# Patient Record
Sex: Male | Born: 1980 | Race: Black or African American | Hispanic: No | Marital: Single | Smoking: Former smoker
Health system: Southern US, Community
[De-identification: ages and names within clinical notes are randomized; demographics above are authoritative.]

## PROBLEM LIST (undated history)

## (undated) DIAGNOSIS — R112 Nausea with vomiting, unspecified: Secondary | ICD-10-CM

## (undated) DIAGNOSIS — E109 Type 1 diabetes mellitus without complications: Secondary | ICD-10-CM

## (undated) DIAGNOSIS — G629 Polyneuropathy, unspecified: Secondary | ICD-10-CM

## (undated) DIAGNOSIS — E119 Type 2 diabetes mellitus without complications: Secondary | ICD-10-CM

## (undated) DIAGNOSIS — E11311 Type 2 diabetes mellitus with unspecified diabetic retinopathy with macular edema: Secondary | ICD-10-CM

---

## 2002-03-11 ENCOUNTER — Encounter: Payer: Self-pay | Admitting: Emergency Medicine

## 2002-03-11 ENCOUNTER — Emergency Department (HOSPITAL_COMMUNITY): Admission: EM | Admit: 2002-03-11 | Discharge: 2002-03-11 | Payer: Self-pay | Admitting: Emergency Medicine

## 2002-10-11 ENCOUNTER — Emergency Department (HOSPITAL_COMMUNITY): Admission: EM | Admit: 2002-10-11 | Discharge: 2002-10-11 | Payer: Self-pay | Admitting: Internal Medicine

## 2014-04-30 ENCOUNTER — Encounter (HOSPITAL_COMMUNITY): Payer: Self-pay | Admitting: Emergency Medicine

## 2014-04-30 ENCOUNTER — Emergency Department (HOSPITAL_COMMUNITY)
Admission: EM | Admit: 2014-04-30 | Discharge: 2014-04-30 | Disposition: A | Discharge status: Home / Self Care | Payer: PRIVATE HEALTH INSURANCE | Attending: Emergency Medicine | Admitting: Emergency Medicine

## 2014-04-30 ENCOUNTER — Emergency Department (HOSPITAL_COMMUNITY): Payer: PRIVATE HEALTH INSURANCE

## 2014-04-30 DIAGNOSIS — IMO0002 Reserved for concepts with insufficient information to code with codable children: Secondary | ICD-10-CM | POA: Insufficient documentation

## 2014-04-30 DIAGNOSIS — E119 Type 2 diabetes mellitus without complications: Secondary | ICD-10-CM | POA: Insufficient documentation

## 2014-04-30 DIAGNOSIS — F17200 Nicotine dependence, unspecified, uncomplicated: Secondary | ICD-10-CM | POA: Insufficient documentation

## 2014-04-30 DIAGNOSIS — T148XXA Other injury of unspecified body region, initial encounter: Secondary | ICD-10-CM

## 2014-04-30 DIAGNOSIS — S93409A Sprain of unspecified ligament of unspecified ankle, initial encounter: Secondary | ICD-10-CM | POA: Insufficient documentation

## 2014-04-30 DIAGNOSIS — S96919A Strain of unspecified muscle and tendon at ankle and foot level, unspecified foot, initial encounter: Secondary | ICD-10-CM

## 2014-04-30 DIAGNOSIS — Y929 Unspecified place or not applicable: Secondary | ICD-10-CM | POA: Insufficient documentation

## 2014-04-30 DIAGNOSIS — Y9389 Activity, other specified: Secondary | ICD-10-CM | POA: Insufficient documentation

## 2014-04-30 DIAGNOSIS — Z880 Allergy status to penicillin: Secondary | ICD-10-CM | POA: Insufficient documentation

## 2014-04-30 DIAGNOSIS — W171XXA Fall into storm drain or manhole, initial encounter: Secondary | ICD-10-CM | POA: Insufficient documentation

## 2014-04-30 HISTORY — DX: Type 2 diabetes mellitus without complications: E11.9

## 2014-04-30 MED ORDER — IBUPROFEN 800 MG PO TABS: 800.0000 mg | ORAL_TABLET | Freq: Three times a day (TID) | ORAL | Status: DC

## 2014-04-30 NOTE — ED Notes (Signed)
Pt presents with injury to right ankle. Pt states a metal manhole cover fell onto his ankle/foot. Pt states is unable to put "too much weight" onto foot.

## 2014-04-30 NOTE — Discharge Instructions (Signed)
Ankle Sprain °An ankle sprain is an injury to the strong, fibrous tissues (ligaments) that hold the bones of your ankle joint together.  °CAUSES °An ankle sprain is usually caused by a fall or by twisting your ankle. Ankle sprains most commonly occur when you step on the outer edge of your foot, and your ankle turns inward. People who participate in sports are more prone to these types of injuries.  °SYMPTOMS  °· Pain in your ankle. The pain may be present at rest or only when you are trying to stand or walk. °· Swelling. °· Bruising. Bruising may develop immediately or within 1 to 2 days after your injury. °· Difficulty standing or walking, particularly when turning corners or changing directions. °DIAGNOSIS  °Your caregiver will ask you details about your injury and perform a physical exam of your ankle to determine if you have an ankle sprain. During the physical exam, your caregiver will press on and apply pressure to specific areas of your foot and ankle. Your caregiver will try to move your ankle in certain ways. An X-ray exam may be done to be sure a bone was not broken or a ligament did not separate from one of the bones in your ankle (avulsion fracture).  °TREATMENT  °Certain types of braces can help stabilize your ankle. Your caregiver can make a recommendation for this. Your caregiver may recommend the use of medicine for pain. If your sprain is severe, your caregiver may refer you to a surgeon who helps to restore function to parts of your skeletal system (orthopedist) or a physical therapist. °HOME CARE INSTRUCTIONS  °· Apply ice to your injury for 1 2 days or as directed by your caregiver. Applying ice helps to reduce inflammation and pain. °· Put ice in a plastic bag. °· Place a towel between your skin and the bag. °· Leave the ice on for 15-20 minutes at a time, every 2 hours while you are awake. °· Only take over-the-counter or prescription medicines for pain, discomfort, or fever as directed by  your caregiver. °· Elevate your injured ankle above the level of your heart as much as possible for 2 3 days. °· If your caregiver recommends crutches, use them as instructed. Gradually put weight on the affected ankle. Continue to use crutches or a cane until you can walk without feeling pain in your ankle. °· If you have a plaster splint, wear the splint as directed by your caregiver. Do not rest it on anything harder than a pillow for the first 24 hours. Do not put weight on it. Do not get it wet. You may take it off to take a shower or bath. °· You may have been given an elastic bandage to wear around your ankle to provide support. If the elastic bandage is too tight (you have numbness or tingling in your foot or your foot becomes cold and blue), adjust the bandage to make it comfortable. °· If you have an air splint, you may blow more air into it or let air out to make it more comfortable. You may take your splint off at night and before taking a shower or bath. Wiggle your toes in the splint several times per day to decrease swelling. °SEEK MEDICAL CARE IF:  °· You have rapidly increasing bruising or swelling. °· Your toes feel extremely cold or you lose feeling in your foot. °· Your pain is not relieved with medicine. °SEEK IMMEDIATE MEDICAL CARE IF: °· Your toes are numb   or blue.  You have severe pain that is increasing. MAKE SURE YOU:   Understand these instructions.  Will watch your condition.  Will get help right away if you are not doing well or get worse. Document Released: 12/12/2005 Document Revised: 09/05/2012 Document Reviewed: 12/24/2011 [facility] Patient Information 2014 [place], [place].  Abrasion An abrasion is a cut or scrape of the skin. Abrasions do not extend through all layers of the skin and most heal within 10 days. It is important to care for your abrasion properly to prevent infection. CAUSES  Most abrasions are caused by falling on, or gliding across, the ground or other  surface. When your skin rubs on something, the outer and inner layer of skin rubs off, causing an abrasion. DIAGNOSIS  Your caregiver will be able to diagnose an abrasion during a physical exam.  TREATMENT  Your treatment depends on how large and deep the abrasion is. Generally, your abrasion will be cleaned with water and a mild soap to remove any dirt or debris. An antibiotic ointment may be put over the abrasion to prevent an infection. A bandage (dressing) may be wrapped around the abrasion to keep it from getting dirty.  You may need a tetanus shot if:  You cannot remember when you had your last tetanus shot.  You have never had a tetanus shot.  The injury broke your skin. If you get a tetanus shot, your arm may swell, get red, and feel warm to the touch. This is common and not a problem. If you need a tetanus shot and you choose not to have one, there is a rare chance of getting tetanus. Sickness from tetanus can be serious.  HOME CARE INSTRUCTIONS   If a dressing was applied, change it at least once a day or as directed by your caregiver. If the bandage sticks, soak it off with warm water.   Wash the area with water and a mild soap to remove all the ointment 2 times a day. Rinse off the soap and pat the area dry with a clean towel.   Reapply any ointment as directed by your caregiver. This will help prevent infection and keep the bandage from sticking. Use gauze over the wound and under the dressing to help keep the bandage from sticking.   Change your dressing right away if it becomes wet or dirty.   Only take over-the-counter or prescription medicines for pain, discomfort, or fever as directed by your caregiver.   Follow up with your caregiver within 24 48 hours for a wound check, or as directed. If you were not given a wound-check appointment, look closely at your abrasion for redness, swelling, or pus. These are signs of infection. SEEK IMMEDIATE MEDICAL CARE IF:   You  have increasing pain in the wound.   You have redness, swelling, or tenderness around the wound.   You have pus coming from the wound.   You have a fever or persistent symptoms for more than 2 3 days.  You have a fever and your symptoms suddenly get worse.  You have a bad smell coming from the wound or dressing.  MAKE SURE YOU:   Understand these instructions.  Will watch your condition.  Will get help right away if you are not doing well or get worse. Document Released: 09/21/2005 Document Revised: 11/28/2012 Document Reviewed: 11/15/2011 [facility] Patient Information 2014 [place], [place].

## 2014-04-30 NOTE — ED Provider Notes (Signed)
CSN: [id number]     Arrival date & time 04/30/14  1000 History   First MD Initiated Contact with Patient 04/30/14 1003     Chief Complaint  Patient presents with  . Ankle Injury     (Consider location/radiation/quality/duration/timing/severity/associated sxs/prior Treatment) HPI Comments: Pt states that he fell into the man hole. Pt state that it hurts to put wt on the area. Denies numbness or weakness. Feels like he can't put full weight on the area. C/o abrasion to the left pinky finger. Pt states that this is a work related injury. Denies previous ankle of foot injury  The history is provided by the patient. No language interpreter was used.    Past Medical History  Diagnosis Date  . Diabetes mellitus without complication    History reviewed. No pertinent past surgical history. History reviewed. No pertinent family history. History  Substance Use Topics  . Smoking status: Current Every Day Smoker  . Smokeless tobacco: Not on file  . Alcohol Use: Yes    Review of Systems  Constitutional: Negative.   Respiratory: Negative.   Cardiovascular: Negative.       Allergies  Penicillins  Home Medications   Prior to Admission medications   Not on File   BP 135/90  Pulse 96  Temp(Src) 98 F (36.7 C) (Oral)  Resp 18  Ht 6\' 1"  (1.854 m)  Wt 189 lb (85.73 kg)  BMI 24.94 kg/m2  SpO2 100% Physical Exam  Nursing note and vitals reviewed. Constitutional: He is oriented to person, place, and time. He appears well-developed and well-nourished.  Cardiovascular: Normal rate and regular rhythm.   Pulmonary/Chest: Effort normal and breath sounds normal.  Musculoskeletal:  No swelling or deformity to the right ankle and foot. Generalized tenderness  Neurological: He is alert and oriented to person, place, and time.  Skin:  Abrasion to the left pinky finger. Full rom    ED Course  Procedures (including critical care time) Labs Review Labs Reviewed - No data to  display  Imaging Review Dg Ankle Complete Right  04/30/2014   CLINICAL DATA:  Injury, right ankle pain.  EXAM: RIGHT ANKLE - COMPLETE 3+ VIEW  COMPARISON:  None.  FINDINGS: No acute bony or joint abnormality is identified. Small calcification in the Achilles tendon is noted. Soft tissues are unremarkable.  IMPRESSION: No acute abnormality.   Electronically Signed   By: Drusilla Kannerhomas  Dalessio M.D.   On: 04/30/2014 10:49   Dg Foot Complete Right  04/30/2014   CLINICAL DATA:  Right foot and ankle injury.  EXAM: RIGHT FOOT COMPLETE - 3+ VIEW  COMPARISON:  None.  FINDINGS: There is no evidence of fracture or dislocation. Mild hallux valgus. No significant degenerative changes. No bony lesions. Soft tissues are unremarkable.  IMPRESSION: No acute fracture.   Electronically Signed   By: Irish LackGlenn  Yamagata M.D.   On: 04/30/2014 10:57     EKG Interpretation None      MDM   Final diagnoses:  Ankle strain  Abrasion    No acute injury noted. Pt placed in aso for comfort. Pt can follow up with ortho as needed    Teressa LowerVrinda Hugh Garrow, NP 04/30/14 1103

## 2014-04-30 NOTE — ED Notes (Signed)
Discharge instructions reviewed with patient. Pt verbalized understanding of instructions. Pt verbalizes understanding of prescriptions. Pt ambulatory with steady gait. NAD distress noted on d/c  

## 2014-04-30 NOTE — ED Provider Notes (Signed)
Medical screening examination/treatment/procedure(s) were performed by non-physician practitioner and as supervising physician I was immediately available for consultation/collaboration.   EKG Interpretation None       Adriane Guglielmo, MD 04/30/14 1456 

## 2015-05-16 ENCOUNTER — Emergency Department (HOSPITAL_COMMUNITY): Payer: 59

## 2015-05-16 ENCOUNTER — Emergency Department (HOSPITAL_COMMUNITY)
Admission: EM | Admit: 2015-05-16 | Discharge: 2015-05-16 | Disposition: A | Discharge status: Home / Self Care | Payer: 59 | Attending: Emergency Medicine | Admitting: Emergency Medicine

## 2015-05-16 ENCOUNTER — Encounter (HOSPITAL_COMMUNITY): Payer: Self-pay | Admitting: *Deleted

## 2015-05-16 DIAGNOSIS — Y998 Other external cause status: Secondary | ICD-10-CM | POA: Insufficient documentation

## 2015-05-16 DIAGNOSIS — Z791 Long term (current) use of non-steroidal anti-inflammatories (NSAID): Secondary | ICD-10-CM | POA: Diagnosis not present

## 2015-05-16 DIAGNOSIS — W25XXXA Contact with sharp glass, initial encounter: Secondary | ICD-10-CM | POA: Diagnosis not present

## 2015-05-16 DIAGNOSIS — Z720 Tobacco use: Secondary | ICD-10-CM | POA: Insufficient documentation

## 2015-05-16 DIAGNOSIS — Z880 Allergy status to penicillin: Secondary | ICD-10-CM | POA: Insufficient documentation

## 2015-05-16 DIAGNOSIS — Y9389 Activity, other specified: Secondary | ICD-10-CM | POA: Diagnosis not present

## 2015-05-16 DIAGNOSIS — Z79899 Other long term (current) drug therapy: Secondary | ICD-10-CM | POA: Diagnosis not present

## 2015-05-16 DIAGNOSIS — Y9289 Other specified places as the place of occurrence of the external cause: Secondary | ICD-10-CM | POA: Insufficient documentation

## 2015-05-16 DIAGNOSIS — S6991XA Unspecified injury of right wrist, hand and finger(s), initial encounter: Secondary | ICD-10-CM | POA: Diagnosis present

## 2015-05-16 DIAGNOSIS — E119 Type 2 diabetes mellitus without complications: Secondary | ICD-10-CM | POA: Insufficient documentation

## 2015-05-16 DIAGNOSIS — S61411A Laceration without foreign body of right hand, initial encounter: Secondary | ICD-10-CM | POA: Insufficient documentation

## 2015-05-16 DIAGNOSIS — R739 Hyperglycemia, unspecified: Secondary | ICD-10-CM

## 2015-05-16 LAB — CBG MONITORING, ED: Glucose-Capillary: 315 mg/dL — ABNORMAL HIGH (ref 65–99)

## 2015-05-16 MED ORDER — HYDROGEN PEROXIDE 3 % EX SOLN
CUTANEOUS | Status: AC
  Filled 2015-05-16: qty 473

## 2015-05-16 MED ORDER — METFORMIN HCL 500 MG PO TABS: 500.0000 mg | ORAL_TABLET | Freq: Two times a day (BID) | ORAL | Status: DC

## 2015-05-16 NOTE — Discharge Instructions (Signed)
It is important that you follow up with your primary care doctor for further evaluation of your diabetes. Check your blood sugar at home.

## 2015-05-16 NOTE — ED Provider Notes (Signed)
CSN: [id number]     Arrival date & time 05/16/15  1132 History   First MD Initiated Contact with Patient 05/16/15 1213     Chief Complaint  Patient presents with  . Extremity Laceration     (Consider location/radiation/quality/duration/timing/severity/associated sxs/prior Treatment) HPI Zachary Winters is a 34 y.o. male who presents to the ED with lacerations to the right hand. He states that he slammed his hand down on a glass top stove and it shattered. He has removed slivers of glass from his fingers and has several superficial lacerations to the fingers. He denies other injuries but does report that he is a diabetic and has not taken insulin in a few years. He denies any symptoms.   Past Medical History  Diagnosis Date  . Diabetes mellitus without complication    History reviewed. No pertinent past surgical history. No family history on file. History  Substance Use Topics  . Smoking status: Current Every Day Smoker  . Smokeless tobacco: Not on file  . Alcohol Use: Yes    Review of Systems Negative except as stated in HPI   Allergies  Penicillins  Home Medications   Prior to Admission medications   Medication Sig Start Date End Date Taking? Authorizing Provider  ibuprofen (ADVIL,MOTRIN) 800 MG tablet Take 1 tablet (800 mg total) by mouth 3 (three) times daily. 04/30/14  Yes Teressa LowerVrinda Pickering, NP  metFORMIN (GLUCOPHAGE) 500 MG tablet Take 1 tablet (500 mg total) by mouth 2 (two) times daily with a meal. 05/16/15   Ellise Kovack Orlene OchM Taron Conrey, NP   BP 146/84 mmHg  Pulse 102  Temp(Src) 98.9 F (37.2 C) (Oral)  Resp 16  Ht 6\' 1"  (1.854 m)  Wt 200 lb (90.719 kg)  BMI 26.39 kg/m2  SpO2 100% Physical Exam  Constitutional: He is oriented to person, place, and time. He appears well-developed and well-nourished.  HENT:  Head: Normocephalic.  Eyes: EOM are normal.  Neck: Neck supple.  Cardiovascular: Normal rate.   Pulmonary/Chest: Effort normal.  Abdominal: Soft. There is no  tenderness.  Musculoskeletal: Normal range of motion.       Right hand: He exhibits tenderness and laceration. He exhibits normal range of motion, normal capillary refill, no deformity and no swelling. Normal sensation noted. Normal strength noted.       Hands: Neurological: He is alert and oriented to person, place, and time. He has normal strength. No cranial nerve deficit or sensory deficit.  Skin: Skin is warm and dry.  Psychiatric: He has a normal mood and affect. His behavior is normal.  Nursing note and vitals reviewed.   ED Course  Procedures (including critical care time)  Wounds cleaned with betadine and normal saline Wounds closed with Dermabond Patient tolerated procedure without complications.  Instructions on wound care with dermabond.   Labs Review Labs Reviewed  CBG MONITORING, ED - Abnormal; Notable for the following:    Glucose-Capillary 315 (*)    All other components within normal limits    Imaging Review Dg Hand Complete Right  05/16/2015   CLINICAL DATA:  Right hand pain.  Hit glass stove.  EXAM: RIGHT HAND - COMPLETE 3+ VIEW  COMPARISON:  None.  FINDINGS: There is no evidence of fracture or dislocation. There is no evidence of arthropathy or other focal bone abnormality. Soft tissues are unremarkable. No radiopaque foreign body.  IMPRESSION: Negative.   Electronically Signed   By: Charlett NoseKevin  Dover M.D.   On: 05/16/2015 14:08     MDM  34 y.o. male with superficial lacerations to the dorsum of the right middle finger and little finger. Also with elevated BP. Patient is known diabetic and stopped his insulin injections a few years ago. I discussed in detail with the patient need for follow up with his PCP and have his labs checked and start back on insulin. Discussed with Dr. Estell Harpin and will start Metformin today and patient agrees to follow up. Stable for d/c without symptoms at this time.  I discussed with the patient complications associated with untreated  diabetes. Patient voices understanding.   Final diagnoses:  Hand laceration, right, initial encounter  Elevated blood sugar       Janne Napoleon, NP 05/16/15 1644  Bethann Berkshire, MD 05/17/15 1505

## 2015-05-16 NOTE — ED Notes (Addendum)
Pt states laceraton to right middle finger and 5th finger. Pt states he slammed his hand down on glass top stove and it shattered. Pt states he has removed glass from a couple of areas to the hand. Bleeding is controlled.CBG 315

## 2015-05-16 NOTE — ED Notes (Signed)
Pt believes he got tetanus shot last year but not sure; pt states he has not taken any medication for diabetes for last 6-7 years, dx with diabetes at age of 34 years old

## 2017-01-24 ENCOUNTER — Ambulatory Visit: Payer: Self-pay | Admitting: "Endocrinology

## 2019-10-16 ENCOUNTER — Emergency Department (HOSPITAL_COMMUNITY): Payer: No Typology Code available for payment source

## 2019-10-16 ENCOUNTER — Emergency Department (HOSPITAL_COMMUNITY)
Admission: EM | Admit: 2019-10-16 | Discharge: 2019-10-16 | Disposition: A | Discharge status: Home / Self Care | Payer: No Typology Code available for payment source | Attending: Emergency Medicine | Admitting: Emergency Medicine

## 2019-10-16 ENCOUNTER — Encounter (HOSPITAL_COMMUNITY): Payer: Self-pay | Admitting: Emergency Medicine

## 2019-10-16 ENCOUNTER — Other Ambulatory Visit: Payer: Self-pay

## 2019-10-16 DIAGNOSIS — E119 Type 2 diabetes mellitus without complications: Secondary | ICD-10-CM | POA: Diagnosis not present

## 2019-10-16 DIAGNOSIS — M79602 Pain in left arm: Secondary | ICD-10-CM | POA: Diagnosis not present

## 2019-10-16 DIAGNOSIS — Y999 Unspecified external cause status: Secondary | ICD-10-CM | POA: Diagnosis not present

## 2019-10-16 DIAGNOSIS — R519 Headache, unspecified: Secondary | ICD-10-CM | POA: Insufficient documentation

## 2019-10-16 DIAGNOSIS — R0789 Other chest pain: Secondary | ICD-10-CM | POA: Diagnosis not present

## 2019-10-16 DIAGNOSIS — S199XXA Unspecified injury of neck, initial encounter: Secondary | ICD-10-CM | POA: Diagnosis present

## 2019-10-16 DIAGNOSIS — Z7984 Long term (current) use of oral hypoglycemic drugs: Secondary | ICD-10-CM | POA: Insufficient documentation

## 2019-10-16 DIAGNOSIS — Z791 Long term (current) use of non-steroidal anti-inflammatories (NSAID): Secondary | ICD-10-CM | POA: Diagnosis not present

## 2019-10-16 DIAGNOSIS — Y93I9 Activity, other involving external motion: Secondary | ICD-10-CM | POA: Diagnosis not present

## 2019-10-16 DIAGNOSIS — F17210 Nicotine dependence, cigarettes, uncomplicated: Secondary | ICD-10-CM | POA: Diagnosis not present

## 2019-10-16 DIAGNOSIS — S161XXA Strain of muscle, fascia and tendon at neck level, initial encounter: Secondary | ICD-10-CM | POA: Diagnosis not present

## 2019-10-16 DIAGNOSIS — Y92410 Unspecified street and highway as the place of occurrence of the external cause: Secondary | ICD-10-CM | POA: Insufficient documentation

## 2019-10-16 LAB — CBC
HCT: 41.9 % (ref 39.0–52.0)
Hemoglobin: 14.2 g/dL (ref 13.0–17.0)
MCH: 30.9 pg (ref 26.0–34.0)
MCHC: 33.9 g/dL (ref 30.0–36.0)
MCV: 91.1 fL (ref 80.0–100.0)
Platelets: 244 10*3/uL (ref 150–400)
RBC: 4.6 MIL/uL (ref 4.22–5.81)
RDW: 11.9 % (ref 11.5–15.5)
WBC: 9 10*3/uL (ref 4.0–10.5)
nRBC: 0 % (ref 0.0–0.2)

## 2019-10-16 LAB — COMPREHENSIVE METABOLIC PANEL
ALT: 20 U/L (ref 0–44)
AST: 20 U/L (ref 15–41)
Albumin: 3.8 g/dL (ref 3.5–5.0)
Alkaline Phosphatase: 105 U/L (ref 38–126)
Anion gap: 8 (ref 5–15)
BUN: 12 mg/dL (ref 6–20)
CO2: 26 mmol/L (ref 22–32)
Calcium: 9.4 mg/dL (ref 8.9–10.3)
Chloride: 98 mmol/L (ref 98–111)
Creatinine, Ser: 1.17 mg/dL (ref 0.61–1.24)
GFR calc Af Amer: >60 mL/min (ref >60)
GFR calc non Af Amer: >60 mL/min (ref >60)
Glucose, Bld: 420 mg/dL — ABNORMAL HIGH (ref 70–99)
Potassium: 4.2 mmol/L (ref 3.5–5.1)
Sodium: 132 mmol/L — ABNORMAL LOW (ref 135–145)
Total Bilirubin: 1 mg/dL (ref 0.3–1.2)
Total Protein: 7.3 g/dL (ref 6.5–8.1)

## 2019-10-16 MED ORDER — CYCLOBENZAPRINE HCL 10 MG PO TABS
10.0000 mg | ORAL_TABLET | Freq: Once | ORAL | Status: AC
  Administered 2019-10-16: 10 mg via ORAL
  Filled 2019-10-16: qty 1

## 2019-10-16 MED ORDER — CYCLOBENZAPRINE HCL 10 MG PO TABS: 10.0000 mg | ORAL_TABLET | Freq: Two times a day (BID) | ORAL | 0 refills | Status: DC | PRN
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
## 2019-10-16 NOTE — ED Notes (Signed)
Pt removed c-collar. This NT explained to pt the importance of wearing the c-collar and the reasoning it was on. Pt sts "it was making my neck stiff and hurting my chest."
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
## 2019-10-31 ENCOUNTER — Encounter: Payer: Self-pay | Admitting: Family Medicine

## 2019-10-31 ENCOUNTER — Other Ambulatory Visit: Payer: Self-pay

## 2019-10-31 ENCOUNTER — Ambulatory Visit (INDEPENDENT_AMBULATORY_CARE_PROVIDER_SITE_OTHER): Payer: Self-pay | Admitting: Family Medicine

## 2019-10-31 VITALS — BP 144/96 | HR 100 | Temp 98.6°F | Resp 15 | Ht 73.5 in | Wt 166.0 lb

## 2019-10-31 DIAGNOSIS — I10 Essential (primary) hypertension: Secondary | ICD-10-CM

## 2019-10-31 DIAGNOSIS — F1290 Cannabis use, unspecified, uncomplicated: Secondary | ICD-10-CM

## 2019-10-31 DIAGNOSIS — E119 Type 2 diabetes mellitus without complications: Secondary | ICD-10-CM

## 2019-10-31 DIAGNOSIS — Z720 Tobacco use: Secondary | ICD-10-CM

## 2019-10-31 DIAGNOSIS — I152 Hypertension secondary to endocrine disorders: Secondary | ICD-10-CM | POA: Insufficient documentation

## 2019-10-31 DIAGNOSIS — E1159 Type 2 diabetes mellitus with other circulatory complications: Secondary | ICD-10-CM

## 2019-10-31 LAB — GLUCOSE, POCT (MANUAL RESULT ENTRY): POC Glucose: 324 mg/dl — AB (ref 70–99)

## 2019-10-31 MED ORDER — CHLORTHALIDONE 25 MG PO TABS
12.5000 mg | ORAL_TABLET | Freq: Every day | ORAL | 1 refills | Status: DC
Start: 2019-10-31 — End: 2020-01-01
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
## 2019-10-31 NOTE — Patient Instructions (Addendum)
  I appreciate the opportunity to provide you with the care for your health and wellness. Today we discussed: establish care  Follow up: 1 month   Labs today-fasting   Start the diuretic for blood pressure.   Work on diet changes to avoid extra sugar and carb in foods, as well as fried and fatty foods.  When drink tea, start half and half with unsweet and sweet to wean off.  Continue playing BB and work to get 30-60 minutes of exercise daily.   We will order insulin and supplies once we know your A1c number   Please continue to practice social distancing to keep you, your family, and our community safe.  If you must go out, please wear a mask and practice good handwashing.  It was a pleasure to see you and I look forward to continuing to work together on your health and well-being. Please do not hesitate to call the office if you need care or have questions about your care.  Have a wonderful day and week. With Gratitude, Cherly Beach, DNP, AGNP-BC

## 2019-11-01 LAB — COMPLETE METABOLIC PANEL WITH GFR
AG Ratio: 1.1 (calc) (ref 1.0–2.5)
ALT: 16 U/L (ref 9–46)
AST: 12 U/L (ref 10–40)
Albumin: 4 g/dL (ref 3.6–5.1)
Alkaline phosphatase (APISO): 109 U/L (ref 36–130)
BUN: 14 mg/dL (ref 7–25)
CO2: 30 mmol/L (ref 20–32)
Calcium: 9.7 mg/dL (ref 8.6–10.3)
Chloride: 99 mmol/L (ref 98–110)
Creat: 1.15 mg/dL (ref 0.60–1.35)
GFR, Est African American: 94 mL/min/{1.73_m2} (ref >=60)
GFR, Est Non African American: 81 mL/min/{1.73_m2} (ref >=60)
Globulin: 3.5 g/dL (calc) (ref 1.9–3.7)
Glucose, Bld: 370 mg/dL — ABNORMAL HIGH (ref 65–99)
Potassium: 4.6 mmol/L (ref 3.5–5.3)
Sodium: 136 mmol/L (ref 135–146)
Total Bilirubin: 0.4 mg/dL (ref 0.2–1.2)
Total Protein: 7.5 g/dL (ref 6.1–8.1)

## 2019-11-01 LAB — LIPID PANEL
Cholesterol: 270 mg/dL — ABNORMAL HIGH (ref <200)
HDL: 57 mg/dL (ref >=40)
LDL Cholesterol (Calc): 194 mg/dL (calc) — ABNORMAL HIGH
Non-HDL Cholesterol (Calc): 213 mg/dL (calc) — ABNORMAL HIGH (ref <130)
Total CHOL/HDL Ratio: 4.7 (calc) (ref <5.0)
Triglycerides: 80 mg/dL (ref <150)

## 2019-11-01 LAB — HEMOGLOBIN A1C
Hgb A1c MFr Bld: 13.4 % of total Hgb — ABNORMAL HIGH (ref <5.7)
Mean Plasma Glucose: 338 (calc)
eAG (mmol/L): 18.7 (calc)

## 2019-11-01 LAB — MICROALBUMIN / CREATININE URINE RATIO
Creatinine, Urine: 96 mg/dL (ref 20–320)
Microalb Creat Ratio: 385 mcg/mg creat — ABNORMAL HIGH (ref <30)
Microalb, Ur: 37 mg/dL

## 2019-11-01 LAB — TSH: TSH: 1.82 mIU/L (ref 0.40–4.50)

## 2019-11-06 NOTE — Progress Notes (Signed)
Needs to start insulin, please see if he is willing to start it and check blood sugars 4 times daily. Needs to start statin for cholesterol control as well.  Heavy focus on DM control and Cholesterol control needed. Kidney is stable, liver as well, but needs to get DM uncontrol to keep these healthy. Thyroid is stable.

## 2019-11-11 ENCOUNTER — Encounter: Payer: Self-pay | Admitting: Family Medicine

## 2019-11-11 ENCOUNTER — Telehealth: Payer: Self-pay | Admitting: *Deleted
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
## 2019-11-12 ENCOUNTER — Other Ambulatory Visit: Payer: Self-pay | Admitting: Family Medicine

## 2019-11-12 DIAGNOSIS — E119 Type 2 diabetes mellitus without complications: Secondary | ICD-10-CM

## 2019-11-12 MED ORDER — ATORVASTATIN CALCIUM 10 MG PO TABS: 10.0000 mg | ORAL_TABLET | Freq: Every day | ORAL | 1 refills | Status: DC

## 2019-11-12 MED ORDER — INSULIN LISPRO (1 UNIT DIAL) 100 UNIT/ML (KWIKPEN): 5.0000 [IU] | PEN_INJECTOR | Freq: Three times a day (TID) | SUBCUTANEOUS | 2 refills | Status: DC

## 2019-11-12 MED ORDER — INSULIN GLARGINE 100 UNIT/ML SOLOSTAR PEN: 15.0000 [IU] | PEN_INJECTOR | Freq: Every day | SUBCUTANEOUS | 11 refills | Status: DC

## 2019-11-12 MED ORDER — INSULIN PEN NEEDLE 31G X 8 MM MISC: 1.0000 | Freq: Three times a day (TID) | 1 refills | Status: DC

## 2019-11-12 MED ORDER — BLOOD GLUCOSE METER KIT: PACK | 0 refills | Status: DC

## 2019-11-29 ENCOUNTER — Ambulatory Visit: Payer: Medicaid Other | Admitting: Family Medicine

## 2019-11-29 ENCOUNTER — Other Ambulatory Visit: Payer: Self-pay

## 2020-01-01 ENCOUNTER — Ambulatory Visit (INDEPENDENT_AMBULATORY_CARE_PROVIDER_SITE_OTHER): Payer: Self-pay | Admitting: Family Medicine

## 2020-01-01 ENCOUNTER — Other Ambulatory Visit: Payer: Self-pay

## 2020-01-01 ENCOUNTER — Encounter: Payer: Self-pay | Admitting: Family Medicine

## 2020-01-01 VITALS — BP 138/88 | HR 115 | Temp 99.3°F | Resp 15 | Ht 73.0 in | Wt 165.0 lb

## 2020-01-01 DIAGNOSIS — E1165 Type 2 diabetes mellitus with hyperglycemia: Secondary | ICD-10-CM | POA: Insufficient documentation

## 2020-01-01 DIAGNOSIS — E785 Hyperlipidemia, unspecified: Secondary | ICD-10-CM

## 2020-01-01 DIAGNOSIS — E1159 Type 2 diabetes mellitus with other circulatory complications: Secondary | ICD-10-CM

## 2020-01-01 DIAGNOSIS — I10 Essential (primary) hypertension: Secondary | ICD-10-CM

## 2020-01-01 DIAGNOSIS — E1140 Type 2 diabetes mellitus with diabetic neuropathy, unspecified: Secondary | ICD-10-CM

## 2020-01-01 DIAGNOSIS — E1169 Type 2 diabetes mellitus with other specified complication: Secondary | ICD-10-CM

## 2020-01-01 MED ORDER — BLOOD GLUCOSE METER KIT: PACK | 0 refills | Status: DC

## 2020-01-01 MED ORDER — LISINOPRIL 10 MG PO TABS
10.0000 mg | ORAL_TABLET | Freq: Every day | ORAL | 1 refills | Status: DC
Start: 2020-01-01 — End: 2021-01-21

## 2020-01-01 MED ORDER — GABAPENTIN 100 MG PO CAPS: 100.0000 mg | ORAL_CAPSULE | Freq: Every day | ORAL | 1 refills | Status: DC

## 2020-01-01 MED ORDER — INSULIN GLARGINE 100 UNIT/ML SOLOSTAR PEN: 15.0000 [IU] | PEN_INJECTOR | Freq: Every day | SUBCUTANEOUS | 11 refills | Status: DC

## 2020-01-01 MED ORDER — INSULIN PEN NEEDLE 31G X 8 MM MISC: 1.0000 | Freq: Three times a day (TID) | 1 refills | Status: DC

## 2020-01-01 MED ORDER — ATORVASTATIN CALCIUM 10 MG PO TABS: 10.0000 mg | ORAL_TABLET | Freq: Every day | ORAL | 1 refills | Status: DC

## 2020-01-01 MED ORDER — INSULIN LISPRO (1 UNIT DIAL) 100 UNIT/ML (KWIKPEN): 5.0000 [IU] | PEN_INJECTOR | Freq: Three times a day (TID) | SUBCUTANEOUS | 2 refills | Status: DC

## 2020-01-01 NOTE — Assessment & Plan Note (Addendum)
Uncontrolled diabetes.  Started on Lipitor.  Advised for him to continue taking the medication as directed.  In addition to maintaining a heart healthy low-fat diabetic friendly diet.  And exercising 30 minutes on at least 5 days of the week.

## 2020-01-01 NOTE — Patient Instructions (Addendum)
Happy New Year! May you have a year filled with hope, love, happiness and laughter.  I appreciate the opportunity to provide you with care for your health and wellness. 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 Use good RX if you can.  Please take them as directed, but if you have questions call us. I would like to see you back in 1 month and hopefully at that time you will be on the insulin regularly.  Check blood sugar before each meal and at bedtime. (please eat regularly) Keep a log of this for our next visit.  I changed your blood pressure medication to one that will not cause you to go to the bathroom as much. I also added a night time med to help with your diabetic neuropathy.  Please continue to practice social distancing to keep you, your family, and our community safe.  If you must go out, please wear a mask and practice good handwashing.  It was a pleasure to see you and I look forward to continuing to work together on your health and well-being. Please do not hesitate to call the office if you need care or have questions about your care.  Have a wonderful day and week. With Gratitude, Tereasa Coop, DNP, AGNP-BC

## 2020-01-01 NOTE — Progress Notes (Signed)
Because of his diabetes      Subjective:  Patient ID: Zachary Winters, male    DOB: February 14, 1981  Age: 39 y.o. MRN: [id number]  CC:  Chief Complaint  Patient presents with  . Diabetes    follow up bottom of feet are hurting then started traveling up to legs x 2 mths      HPI  HPI  Mr. Gary is here today secondary to having ongoing tolerated neuropathy in his legs that is traveling up from his feet.  Is been going on for about 2 months.  I previously saw Mr. Faucett back on November 5 to establish care.  At that time he reports he was diagnosed with type 1 diabetes at age 70 and he was told that he was type 2 diabetes he is unsure of what he is and he knows he is to take care of himself.  I started him on Lantus and meal coverage which she reports today that he never picked up or received.  Secondary to having an upper change in the pharmacy that he was using not knowing how to get a hold of him.  He reports that he failed to follow-up with it as well.  He reports he stopped taking his blood pressure medicine because it was making him urinate more.  He reports that he is taking his Lipitor but he is unsure of which medicines he has at home and what he supposed to be taking and doing.  He reports he needs something for bilateral foot and leg neuropathy.  He says that that is gotten much worse.  He reports that his insurance seems to be not being filed and needs to go to have to call to get that situated before he can afford all of his medications.  He reports he needs a work note so that he can make sure he takes breaks at regular times to give him self his insulin and check his blood sugar.  He reports that he is not done the best as far as getting his diet under control.  Last A1c at my check was 13.4%.  Today patient denies signs and symptoms of COVID 19 infection including fever, chills, cough, shortness of breath, and headache. Past Medical, Surgical, Social History, Allergies, and Medications  have been Reviewed.   Past Medical History:  Diagnosis Date  . Diabetes mellitus without complication (HCC)     Current Meds  Medication Sig  . atorvastatin (LIPITOR) 10 MG tablet Take 1 tablet (10 mg total) by mouth daily.  . blood glucose meter kit and supplies Dispense based on patient and insurance preference. Check blood sugar four times daily as directed. (FOR ICD-10 E10.9, E11.9).  . cyclobenzaprine (FLEXERIL) 10 MG tablet Take 1 tablet (10 mg total) by mouth 2 (two) times daily as needed for muscle spasms.  Marland Kitchen ibuprofen (ADVIL,MOTRIN) 800 MG tablet Take 1 tablet (800 mg total) by mouth 3 (three) times daily.  . Insulin Pen Needle 31G X 8 MM MISC 1 each by Does not apply route 3 (three) times daily before meals.  . [DISCONTINUED] atorvastatin (LIPITOR) 10 MG tablet Take 1 tablet (10 mg total) by mouth daily.  . [DISCONTINUED] blood glucose meter kit and supplies Dispense based on patient and insurance preference. Check blood sugar four times daily as directed. (FOR ICD-10 E10.9, E11.9).  . [DISCONTINUED] chlorthalidone (HYGROTON) 25 MG tablet Take 0.5 tablets (12.5 mg total) by mouth daily.  . [DISCONTINUED] Insulin Pen Needle 31G X  8 MM MISC 1 each by Does not apply route 3 (three) times daily before meals.    ROS:  Review of Systems  Constitutional: Negative.   HENT: Negative.   Eyes: Negative.   Respiratory: Negative.   Cardiovascular: Negative.   Gastrointestinal: Negative.   Genitourinary: Negative.   Musculoskeletal: Negative.   Skin: Negative.   Neurological: Positive for sensory change.       Neuropathy bilaterally from feet into the buttocks  Endo/Heme/Allergies: Negative.   Psychiatric/Behavioral: Negative.   All other systems reviewed and are negative.    Objective:   Today's Vitals: BP 138/88   Pulse (!) 115   Temp 99.3 F (37.4 C) (Oral)   Resp 15   Ht 6' 1"  (1.854 m)   Wt 165 lb 0.6 oz (74.9 kg)   SpO2 98%   BMI 21.77 kg/m  Vitals with BMI  01/01/2020 10/31/2019 10/16/2019  Height 6' 1"  6' 1.5" 6' 1"   Weight 165 lbs 1 oz 166 lbs 1 oz 170 lbs  BMI 21.78 98.92 11.94  Systolic 174 081 448  Diastolic 88 96 185  Pulse 115 100 86     Physical Exam Vitals and nursing note reviewed.  Constitutional:      Appearance: Normal appearance. He is well-developed, well-groomed and normal weight.  HENT:     Head: Normocephalic and atraumatic.     Right Ear: External ear normal.     Left Ear: External ear normal.     Nose: Nose normal.     Mouth/Throat:     Mouth: Mucous membranes are moist.     Pharynx: Oropharynx is clear.  Eyes:     General:        Right eye: No discharge.        Left eye: No discharge.     Conjunctiva/sclera: Conjunctivae normal.  Cardiovascular:     Rate and Rhythm: Normal rate and regular rhythm.     Pulses: Normal pulses.     Heart sounds: Normal heart sounds.  Pulmonary:     Effort: Pulmonary effort is normal.     Breath sounds: Normal breath sounds.  Musculoskeletal:        General: Normal range of motion.     Cervical back: Normal range of motion and neck supple.  Skin:    General: Skin is warm.  Neurological:     General: No focal deficit present.     Mental Status: He is alert and oriented to person, place, and time.  Psychiatric:        Attention and Perception: Attention normal.        Mood and Affect: Mood normal.        Speech: Speech normal.        Behavior: Behavior normal. Behavior is cooperative.        Thought Content: Thought content normal.        Cognition and Memory: Cognition normal.        Judgment: Judgment normal.     Assessment   1. Type 2 diabetes mellitus with diabetic neuropathy, unspecified whether long term insulin use ([place])   2. Hypertension associated with diabetes ([place])   3. Hyperlipidemia associated with type 2 diabetes mellitus ([place])   4. Essential hypertension     Tests ordered No orders of the defined types were placed in this  encounter.    Plan: Please see assessment and plan per problem list above.   Meds ordered this encounter  Medications  . atorvastatin (  LIPITOR) 10 MG tablet    Sig: Take 1 tablet (10 mg total) by mouth daily.    Dispense:  30 tablet    Refill:  1    Order Specific Question:   Supervising Provider    Answer:   SIMPSON, MARGARET E [[id number]]  . blood glucose meter kit and supplies    Sig: Dispense based on patient and insurance preference. Check blood sugar four times daily as directed. (FOR ICD-10 E10.9, E11.9).    Dispense:  1 each    Refill:  0    Order Specific Question:   Supervising Provider    Answer:   Fayrene Helper [[id number]]    Order Specific Question:   Number of strips    Answer:   100    Order Specific Question:   Number of lancets    Answer:   100  . lisinopril (ZESTRIL) 10 MG tablet    Sig: Take 1 tablet (10 mg total) by mouth daily.    Dispense:  30 tablet    Refill:  1    Order Specific Question:   Supervising Provider    Answer:   SIMPSON, MARGARET E [[id number]]  . Insulin Pen Needle 31G X 8 MM MISC    Sig: 1 each by Does not apply route 3 (three) times daily before meals.    Dispense:  100 each    Refill:  1    Order Specific Question:   Supervising Provider    Answer:   SIMPSON, MARGARET E [[id number]]  . Insulin Glargine (LANTUS) 100 UNIT/ML Solostar Pen    Sig: Inject 15 Units into the skin at bedtime.    Dispense:  15 mL    Refill:  11    Order Specific Question:   Supervising Provider    Answer:   Tula Nakayama E [[id number]]  . insulin lispro (HUMALOG KWIKPEN) 100 UNIT/ML KwikPen    Sig: Inject 0.05 mLs (5 Units total) into the skin 3 (three) times daily before meals.    Dispense:  15 mL    Refill:  2    Order Specific Question:   Supervising Provider    Answer:   SIMPSON, MARGARET E [[id number]]  . gabapentin (NEURONTIN) 100 MG capsule    Sig: Take 1 capsule (100 mg total) by mouth at bedtime.    Dispense:  30 capsule    Refill:  1    Order Specific Question:    Supervising Provider    Answer:   Fayrene Helper [[id number]]    Patient to follow-up in 1 month .  Perlie Mayo, NP

## 2020-01-01 NOTE — Assessment & Plan Note (Signed)
Not well controlled did not start his medications about 2 months ago when I first saw him.  Reordered all supplies for diabetes.  Extensive education provided again on the need to make sure that he is maintaining a proper diet.  Checking his blood sugars as directed.  Starting gabapentin low-dose to see if that will help with his neuropathy.  Advised that he needs to get his blood sugar under control.  He seems receptive advised close follow-up within a month.  To help him stay on track.

## 2020-01-01 NOTE — Assessment & Plan Note (Signed)
Intermittent control.  Reports that he did not take his chlorthalidone because it made him void.  I have switched him to the ACE inhibitor as he needs to be on this secondary to being diabetic.  He reports that he will follow-up if this medication causes him any problems. He is strongly encouraged to make sure that he is doing 30 minutes of exercise on most days of the week in addition to eating a heart healthy well-balanced low-fat diabetic friendly diet.

## 2020-01-01 NOTE — Assessment & Plan Note (Signed)
Reordered all supplies for diabetes.  Extensive education provided again on the need to make sure that he is maintaining a proper diet.  Checking his blood sugars as directed.   Encouraged to make sure he takes his lisinopril as directed as well.

## 2020-02-06 ENCOUNTER — Ambulatory Visit: Payer: Self-pay | Admitting: Family Medicine

## 2021-01-21 ENCOUNTER — Other Ambulatory Visit: Payer: Self-pay

## 2021-01-21 ENCOUNTER — Encounter: Payer: Self-pay | Admitting: Nurse Practitioner

## 2021-01-21 ENCOUNTER — Ambulatory Visit: Payer: Self-pay | Admitting: Nurse Practitioner

## 2021-01-21 DIAGNOSIS — E1159 Type 2 diabetes mellitus with other circulatory complications: Secondary | ICD-10-CM

## 2021-01-21 DIAGNOSIS — E1140 Type 2 diabetes mellitus with diabetic neuropathy, unspecified: Secondary | ICD-10-CM

## 2021-01-21 DIAGNOSIS — I152 Hypertension secondary to endocrine disorders: Secondary | ICD-10-CM

## 2021-01-21 DIAGNOSIS — Z794 Long term (current) use of insulin: Secondary | ICD-10-CM

## 2021-01-21 DIAGNOSIS — I10 Essential (primary) hypertension: Secondary | ICD-10-CM

## 2021-01-21 MED ORDER — PEN NEEDLES 32G X 4 MM MISC: 1.0000 | Freq: Three times a day (TID) | 3 refills | Status: DC

## 2021-01-21 MED ORDER — INSULIN LISPRO (1 UNIT DIAL) 100 UNIT/ML (KWIKPEN): 0.0000 [IU] | PEN_INJECTOR | Freq: Three times a day (TID) | SUBCUTANEOUS | 2 refills | Status: DC

## 2021-01-21 MED ORDER — LISINOPRIL 10 MG PO TABS: 10.0000 mg | ORAL_TABLET | Freq: Every day | ORAL | 1 refills | Status: DC

## 2021-01-21 NOTE — Assessment & Plan Note (Signed)
-  well controlled -refilled lisinopril

## 2021-01-21 NOTE — Progress Notes (Signed)
Established Patient Office Visit  Subjective:  Patient ID: Zachary Winters, male    DOB: 05-23-1981  Age: 40 y.o. MRN: [id number]  CC:  Chief Complaint  Patient presents with  . Diabetes    Follow up     HPI Zachary Winters presents for diabetes follow-up. He has neuropathy that affects both feet and legs.  He states that he has some moments where he feels down or depressed because of his neuropathic pain. He has been checking his blood sugar 1-2 times per day.  His blood sugars are been running in the 110s this week when he checks his blood sugar. He has been taking lantus 15 units BID.  This was started when he was hospitalized. He has not been taking humalog at all.  He was previously prescribed this   Past Medical History:  Diagnosis Date  . Diabetes mellitus without complication ([place])     History reviewed. No pertinent surgical history.  History reviewed. No pertinent family history.  Social History   Socioeconomic History  . Marital status: Divorced    Spouse name: Not on file  . Number of children: 1  . Years of education: Not on file  . Highest education level: Some college, no degree  Occupational History  . Not on file  Tobacco Use  . Smoking status: Current Every Day Smoker    Packs/day: 0.25    Years: 20.00    Pack years: 5.00    Types: Cigarettes  . Smokeless tobacco: Never Used  Vaping Use  . Vaping Use: Never used  Substance and Sexual Activity  . Alcohol use: Yes    Comment: social   . Drug use: Yes    Frequency: 2.0 times per week    Types: Marijuana  . Sexual activity: Yes  Other Topics Concern  . Not on file  Social History Narrative   Lives with parents   Has son 6 years-Zachary Winters      Enjoy: BB and son       Diet: everything    Caffeine: tea daily    Water: 3-4 bottles of 16 oz      Wear seat belt   Wear sun protection   Smoke detectors    Does not use phone while driving       Social Determinants of Systems developer Strain: Not on file  Food Insecurity: Not on file  Transportation Needs: Not on file  Physical Activity: Not on file  Stress: Not on file  Social Connections: Not on file  Intimate Partner Violence: Not on file    Outpatient Medications Prior to Visit  Medication Sig Dispense Refill  . atorvastatin (LIPITOR) 10 MG tablet Take 1 tablet (10 mg total) by mouth daily. 30 tablet 1  . blood glucose meter kit and supplies Dispense based on patient and insurance preference. Check blood sugar four times daily as directed. (FOR ICD-10 E10.9, E11.9). 1 each 0  . cyclobenzaprine (FLEXERIL) 10 MG tablet Take 1 tablet (10 mg total) by mouth 2 (two) times daily as needed for muscle spasms. 20 tablet 0  . gabapentin (NEURONTIN) 100 MG capsule Take 1 capsule (100 mg total) by mouth at bedtime. 30 capsule 1  . ibuprofen (ADVIL,MOTRIN) 800 MG tablet Take 1 tablet (800 mg total) by mouth 3 (three) times daily. 21 tablet 0  . Insulin Glargine (LANTUS) 100 UNIT/ML Solostar Pen Inject 15 Units into the skin at bedtime. 15 mL 11  .  Insulin Pen Needle 31G X 8 MM MISC 1 each by Does not apply route 3 (three) times daily before meals. 100 each 1  . insulin lispro (HUMALOG KWIKPEN) 100 UNIT/ML KwikPen Inject 0.05 mLs (5 Units total) into the skin 3 (three) times daily before meals. 15 mL 2  . lisinopril (ZESTRIL) 10 MG tablet Take 1 tablet (10 mg total) by mouth daily. 30 tablet 1   No facility-administered medications prior to visit.    Allergies  Allergen Reactions  . Metformin Nausea And Vomiting  . Penicillins Swelling    ROS Review of Systems    Objective:    Physical Exam  There were no vitals taken for this visit. Wt Readings from Last 3 Encounters:  01/01/20 165 lb 0.6 oz (74.9 kg)  10/31/19 166 lb 0.6 oz (75.3 kg)  10/16/19 170 lb (77.1 kg)     Health Maintenance Due  Topic Date Due  . Hepatitis C Screening  Never done  . PNEUMOCOCCAL POLYSACCHARIDE VACCINE AGE 25-64 HIGH RISK   Never done  . COVID-19 Vaccine (1) Never done  . FOOT EXAM  Never done  . OPHTHALMOLOGY EXAM  Never done  . HEMOGLOBIN A1C  04/29/2020  . INFLUENZA VACCINE  Never done    There are no preventive care reminders to display for this patient.  Lab Results  Component Value Date   TSH 1.82 10/31/2019   Lab Results  Component Value Date   WBC 9.0 10/16/2019   HGB 14.2 10/16/2019   HCT 41.9 10/16/2019   MCV 91.1 10/16/2019   PLT 244 10/16/2019   Lab Results  Component Value Date   NA 136 10/31/2019   K 4.6 10/31/2019   CO2 30 10/31/2019   GLUCOSE 370 (H) 10/31/2019   BUN 14 10/31/2019   CREATININE 1.15 10/31/2019   BILITOT 0.4 10/31/2019   ALKPHOS 105 10/16/2019   AST 12 10/31/2019   ALT 16 10/31/2019   PROT 7.5 10/31/2019   ALBUMIN 3.8 10/16/2019   CALCIUM 9.7 10/31/2019   ANIONGAP 8 10/16/2019   Lab Results  Component Value Date   CHOL 270 (H) 10/31/2019   Lab Results  Component Value Date   HDL 57 10/31/2019   Lab Results  Component Value Date   LDLCALC 194 (H) 10/31/2019   Lab Results  Component Value Date   TRIG 80 10/31/2019   Lab Results  Component Value Date   CHOLHDL 4.7 10/31/2019   Lab Results  Component Value Date   HGBA1C 13.4 (H) 10/31/2019      Assessment & Plan:   Problem List Items Addressed This Visit      Cardiovascular and Mediastinum   Hypertension associated with diabetes ([place])   Relevant Medications   lisinopril (ZESTRIL) 10 MG tablet   insulin lispro (HUMALOG KWIKPEN) 100 UNIT/ML KwikPen   Essential hypertension    -well controlled -refilled lisinopril      Relevant Medications   lisinopril (ZESTRIL) 10 MG tablet   insulin lispro (HUMALOG KWIKPEN) 100 UNIT/ML KwikPen     Endocrine   Type 2 diabetes mellitus with diabetic neuropathy (HCC)     Lab Results  Component Value Date   HGBA1C 13.4 (H) 10/31/2019   -Last A1c draw was on 10/30/20 -he has been non-adherent to his humalog -his lantus was increased to 15  units BID when he was seen at the ED at [facility] -Rx. humalog per moderate sliding scale (0-12 units at meal time) -blood sugar logs provided to patient; return  with those in 3 weeks      Relevant Medications   lisinopril (ZESTRIL) 10 MG tablet   insulin lispro (HUMALOG KWIKPEN) 100 UNIT/ML KwikPen   Other Relevant Orders   CBC with Differential/Platelet   CMP14+EGFR   Hemoglobin A1c   Lipid Panel With LDL/HDL Ratio      Meds ordered this encounter  Medications  . lisinopril (ZESTRIL) 10 MG tablet    Sig: Take 1 tablet (10 mg total) by mouth daily.    Dispense:  90 tablet    Refill:  1  . insulin lispro (HUMALOG KWIKPEN) 100 UNIT/ML KwikPen    Sig: Inject 0-12 Units into the skin 3 (three) times daily before meals. Use your sliding scale to determine the correct dose.    Dispense:  15 mL    Refill:  2    30-day supply; E11.65  . Insulin Pen Needle (PEN NEEDLES) 32G X 4 MM MISC    Sig: 1 each by Does not apply route 3 (three) times daily before meals.    Dispense:  100 each    Refill:  3    30-day supply; E11.65    Follow-up: Return in about 3 weeks (around 02/11/2021) for Lab follow-up.    Noreene Larsson, NP

## 2021-01-21 NOTE — Assessment & Plan Note (Signed)
  Lab Results  Component Value Date   HGBA1C 13.4 (H) 10/31/2019   -Last A1c draw was on 10/30/20 -he has been non-adherent to his humalog -his lantus was increased to 15 units BID when he was seen at the ED at [facility] -Rx. humalog per moderate sliding scale (0-12 units at meal time) -blood sugar logs provided to patient; return with those in 3 weeks

## 2021-01-21 NOTE — Patient Instructions (Signed)
We will see you back in 3 weeks for a lab check.  Please check your blood sugar before meals and at bedtime.  Record your blood sugar on the logs we provided, and bring those with you at the next appointment.  Please get fasting labs drawn 2-3 days prior to your next appointment.

## 2021-02-11 ENCOUNTER — Ambulatory Visit: Payer: Self-pay | Admitting: Nurse Practitioner

## 2021-03-14 ENCOUNTER — Other Ambulatory Visit: Payer: Self-pay

## 2021-03-14 ENCOUNTER — Emergency Department (HOSPITAL_COMMUNITY): Payer: Self-pay

## 2021-03-14 ENCOUNTER — Observation Stay (HOSPITAL_COMMUNITY)
Admission: EM | Admit: 2021-03-14 | Discharge: 2021-03-15 | Disposition: A | Discharge status: Home / Self Care | Payer: Self-pay | Attending: Internal Medicine | Admitting: Internal Medicine

## 2021-03-14 ENCOUNTER — Encounter (HOSPITAL_COMMUNITY): Payer: Self-pay

## 2021-03-14 DIAGNOSIS — Z79899 Other long term (current) drug therapy: Secondary | ICD-10-CM | POA: Insufficient documentation

## 2021-03-14 DIAGNOSIS — R1110 Vomiting, unspecified: Secondary | ICD-10-CM | POA: Diagnosis present

## 2021-03-14 DIAGNOSIS — R112 Nausea with vomiting, unspecified: Secondary | ICD-10-CM | POA: Insufficient documentation

## 2021-03-14 DIAGNOSIS — Z794 Long term (current) use of insulin: Secondary | ICD-10-CM | POA: Insufficient documentation

## 2021-03-14 DIAGNOSIS — I10 Essential (primary) hypertension: Secondary | ICD-10-CM | POA: Insufficient documentation

## 2021-03-14 DIAGNOSIS — E876 Hypokalemia: Secondary | ICD-10-CM | POA: Insufficient documentation

## 2021-03-14 DIAGNOSIS — Z20822 Contact with and (suspected) exposure to covid-19: Secondary | ICD-10-CM | POA: Insufficient documentation

## 2021-03-14 DIAGNOSIS — Z791 Long term (current) use of non-steroidal anti-inflammatories (NSAID): Secondary | ICD-10-CM | POA: Insufficient documentation

## 2021-03-14 DIAGNOSIS — E872 Acidosis, unspecified: Secondary | ICD-10-CM | POA: Diagnosis present

## 2021-03-14 DIAGNOSIS — E860 Dehydration: Secondary | ICD-10-CM

## 2021-03-14 DIAGNOSIS — I152 Hypertension secondary to endocrine disorders: Secondary | ICD-10-CM

## 2021-03-14 DIAGNOSIS — R109 Unspecified abdominal pain: Secondary | ICD-10-CM | POA: Insufficient documentation

## 2021-03-14 DIAGNOSIS — R6510 Systemic inflammatory response syndrome (SIRS) of non-infectious origin without acute organ dysfunction: Secondary | ICD-10-CM | POA: Diagnosis present

## 2021-03-14 DIAGNOSIS — G9340 Encephalopathy, unspecified: Secondary | ICD-10-CM | POA: Insufficient documentation

## 2021-03-14 DIAGNOSIS — F17210 Nicotine dependence, cigarettes, uncomplicated: Secondary | ICD-10-CM | POA: Insufficient documentation

## 2021-03-14 DIAGNOSIS — R739 Hyperglycemia, unspecified: Secondary | ICD-10-CM

## 2021-03-14 DIAGNOSIS — E1159 Type 2 diabetes mellitus with other circulatory complications: Secondary | ICD-10-CM | POA: Diagnosis present

## 2021-03-14 DIAGNOSIS — R400 Somnolence: Secondary | ICD-10-CM | POA: Diagnosis present

## 2021-03-14 DIAGNOSIS — E1165 Type 2 diabetes mellitus with hyperglycemia: Secondary | ICD-10-CM | POA: Diagnosis present

## 2021-03-14 DIAGNOSIS — E1140 Type 2 diabetes mellitus with diabetic neuropathy, unspecified: Secondary | ICD-10-CM | POA: Insufficient documentation

## 2021-03-14 LAB — COMPREHENSIVE METABOLIC PANEL
ALT: 18 U/L (ref 0–44)
AST: 16 U/L (ref 15–41)
Albumin: 4.2 g/dL (ref 3.5–5.0)
Alkaline Phosphatase: 87 U/L (ref 38–126)
Anion gap: 12 (ref 5–15)
BUN: 25 mg/dL — ABNORMAL HIGH (ref 6–20)
CO2: 25 mmol/L (ref 22–32)
Calcium: 9.8 mg/dL (ref 8.9–10.3)
Chloride: 99 mmol/L (ref 98–111)
Creatinine, Ser: 1.28 mg/dL — ABNORMAL HIGH (ref 0.61–1.24)
GFR, Estimated: >60 mL/min (ref >60)
Glucose, Bld: 254 mg/dL — ABNORMAL HIGH (ref 70–99)
Potassium: 3.3 mmol/L — ABNORMAL LOW (ref 3.5–5.1)
Sodium: 136 mmol/L (ref 135–145)
Total Bilirubin: 0.6 mg/dL (ref 0.3–1.2)
Total Protein: 7.9 g/dL (ref 6.5–8.1)

## 2021-03-14 LAB — CBC WITH DIFFERENTIAL/PLATELET
Abs Immature Granulocytes: 0.07 10*3/uL (ref 0.00–0.07)
Basophils Absolute: 0 10*3/uL (ref 0.0–0.1)
Basophils Relative: 0 %
Eosinophils Absolute: 0 10*3/uL (ref 0.0–0.5)
Eosinophils Relative: 0 %
HCT: 40.8 % (ref 39.0–52.0)
Hemoglobin: 13.9 g/dL (ref 13.0–17.0)
Immature Granulocytes: 0 %
Lymphocytes Relative: 11 %
Lymphs Abs: 1.8 10*3/uL (ref 0.7–4.0)
MCH: 30.5 pg (ref 26.0–34.0)
MCHC: 34.1 g/dL (ref 30.0–36.0)
MCV: 89.7 fL (ref 80.0–100.0)
Monocytes Absolute: 0.8 10*3/uL (ref 0.1–1.0)
Monocytes Relative: 5 %
Neutro Abs: 13.3 10*3/uL — ABNORMAL HIGH (ref 1.7–7.7)
Neutrophils Relative %: 84 %
Platelets: 284 10*3/uL (ref 150–400)
RBC: 4.55 MIL/uL (ref 4.22–5.81)
RDW: 12.8 % (ref 11.5–15.5)
WBC: 15.9 10*3/uL — ABNORMAL HIGH (ref 4.0–10.5)
nRBC: 0 % (ref 0.0–0.2)

## 2021-03-14 LAB — BASIC METABOLIC PANEL
Anion gap: 11 (ref 5–15)
BUN: 23 mg/dL — ABNORMAL HIGH (ref 6–20)
CO2: 27 mmol/L (ref 22–32)
Calcium: 9.4 mg/dL (ref 8.9–10.3)
Chloride: 101 mmol/L (ref 98–111)
Creatinine, Ser: 1.16 mg/dL (ref 0.61–1.24)
GFR, Estimated: >60 mL/min (ref >60)
Glucose, Bld: 170 mg/dL — ABNORMAL HIGH (ref 70–99)
Potassium: 3.9 mmol/L (ref 3.5–5.1)
Sodium: 139 mmol/L (ref 135–145)

## 2021-03-14 LAB — URINALYSIS, ROUTINE W REFLEX MICROSCOPIC
Bacteria, UA: NONE SEEN
Bilirubin Urine: NEGATIVE
Glucose, UA: >=500 mg/dL — AB
Ketones, ur: 20 mg/dL — AB
Leukocytes,Ua: NEGATIVE
Nitrite: NEGATIVE
Protein, ur: >=300 mg/dL — AB
Specific Gravity, Urine: 1.031 — ABNORMAL HIGH (ref 1.005–1.030)
pH: 5 (ref 5.0–8.0)

## 2021-03-14 LAB — PROTIME-INR
INR: 1 (ref 0.8–1.2)
Prothrombin Time: 13 seconds (ref 11.4–15.2)

## 2021-03-14 LAB — BLOOD GAS, VENOUS
Acid-Base Excess: 3.9 mmol/L — ABNORMAL HIGH (ref 0.0–2.0)
Bicarbonate: 27.8 mmol/L (ref 20.0–28.0)
FIO2: 21
O2 Saturation: 76 %
Patient temperature: 36.8
pCO2, Ven: 35.3 mmHg — ABNORMAL LOW (ref 44.0–60.0)
pH, Ven: 7.497 — ABNORMAL HIGH (ref 7.250–7.430)
pO2, Ven: 39.8 mmHg (ref 32.0–45.0)

## 2021-03-14 LAB — AMMONIA: Ammonia: 13 umol/L (ref 9–35)

## 2021-03-14 LAB — LACTIC ACID, PLASMA
Lactic Acid, Venous: 2.2 mmol/L (ref 0.5–1.9)
Lactic Acid, Venous: 3.5 mmol/L (ref 0.5–1.9)
Lactic Acid, Venous: 1.4 mmol/L (ref 0.5–1.9)

## 2021-03-14 LAB — CBG MONITORING, ED
Glucose-Capillary: 249 mg/dL — ABNORMAL HIGH (ref 70–99)
Glucose-Capillary: 163 mg/dL — ABNORMAL HIGH (ref 70–99)

## 2021-03-14 LAB — BETA-HYDROXYBUTYRIC ACID: Beta-Hydroxybutyric Acid: 0.15 mmol/L (ref 0.05–0.27)

## 2021-03-14 LAB — APTT: aPTT: 24 seconds (ref 24–36)

## 2021-03-14 LAB — RESP PANEL BY RT-PCR (FLU A&B, COVID) ARPGX2
Influenza A by PCR: NEGATIVE
Influenza B by PCR: NEGATIVE
SARS Coronavirus 2 by RT PCR: NEGATIVE

## 2021-03-14 LAB — LIPASE, BLOOD: Lipase: 21 U/L (ref 11–51)

## 2021-03-14 MED ORDER — INSULIN GLARGINE 100 UNIT/ML ~~LOC~~ SOLN
10.0000 [IU] | Freq: Every day | SUBCUTANEOUS | Status: DC
  Administered 2021-03-14: 10 [IU] via SUBCUTANEOUS
  Filled 2021-03-14 (×2): qty 0.1

## 2021-03-14 MED ORDER — ONDANSETRON HCL 4 MG/2ML IJ SOLN
4.0000 mg | INTRAMUSCULAR | Status: AC
  Administered 2021-03-14: 4 mg via INTRAVENOUS
  Filled 2021-03-14: qty 2

## 2021-03-14 MED ORDER — GABAPENTIN 100 MG PO CAPS
100.0000 mg | ORAL_CAPSULE | Freq: Every day | ORAL | Status: DC
  Administered 2021-03-14: 100 mg via ORAL
  Filled 2021-03-14: qty 1

## 2021-03-14 MED ORDER — LACTATED RINGERS IV BOLUS (SEPSIS)
2000.0000 mL | Freq: Once | INTRAVENOUS | Status: AC
  Administered 2021-03-14: 2000 mL via INTRAVENOUS

## 2021-03-14 MED ORDER — ENOXAPARIN SODIUM 40 MG/0.4ML ~~LOC~~ SOLN
40.0000 mg | SUBCUTANEOUS | Status: DC
  Administered 2021-03-15: 40 mg via SUBCUTANEOUS
  Filled 2021-03-14: qty 0.4

## 2021-03-14 MED ORDER — SODIUM CHLORIDE 0.9 % IV SOLN: INTRAVENOUS | Status: DC

## 2021-03-14 MED ORDER — IOHEXOL 300 MG/ML  SOLN
100.0000 mL | Freq: Once | INTRAMUSCULAR | Status: AC | PRN
  Administered 2021-03-14: 100 mL via INTRAVENOUS

## 2021-03-14 MED ORDER — INSULIN ASPART 100 UNIT/ML ~~LOC~~ SOLN
0.0000 [IU] | SUBCUTANEOUS | Status: DC
  Administered 2021-03-14: 2 [IU] via SUBCUTANEOUS
  Administered 2021-03-15: 1 [IU] via SUBCUTANEOUS
  Filled 2021-03-14: qty 1

## 2021-03-14 MED ORDER — CYCLOBENZAPRINE HCL 10 MG PO TABS: 10.0000 mg | ORAL_TABLET | Freq: Two times a day (BID) | ORAL | Status: DC | PRN

## 2021-03-14 MED ORDER — ACETAMINOPHEN 325 MG PO TABS: 650.0000 mg | ORAL_TABLET | Freq: Four times a day (QID) | ORAL | Status: DC | PRN

## 2021-03-14 MED ORDER — ACETAMINOPHEN 650 MG RE SUPP: 650.0000 mg | Freq: Four times a day (QID) | RECTAL | Status: DC | PRN

## 2021-03-14 MED ORDER — SODIUM CHLORIDE 0.9 % IV SOLN: INTRAVENOUS | Status: AC

## 2021-03-14 MED ORDER — ATORVASTATIN CALCIUM 10 MG PO TABS
10.0000 mg | ORAL_TABLET | Freq: Every day | ORAL | Status: DC
  Administered 2021-03-15: 10 mg via ORAL
  Filled 2021-03-14 (×2): qty 1

## 2021-03-14 MED ORDER — LEVOFLOXACIN IN D5W 750 MG/150ML IV SOLN
750.0000 mg | Freq: Once | INTRAVENOUS | Status: AC
  Administered 2021-03-14: 750 mg via INTRAVENOUS
  Filled 2021-03-14: qty 150

## 2021-03-14 MED ORDER — ONDANSETRON HCL 4 MG/2ML IJ SOLN: 4.0000 mg | Freq: Four times a day (QID) | INTRAMUSCULAR | Status: DC | PRN

## 2021-03-14 MED ORDER — LISINOPRIL 10 MG PO TABS
10.0000 mg | ORAL_TABLET | Freq: Every day | ORAL | Status: DC
  Administered 2021-03-15: 10 mg via ORAL
  Filled 2021-03-14 (×2): qty 1

## 2021-03-14 MED ORDER — ONDANSETRON HCL 4 MG PO TABS: 4.0000 mg | ORAL_TABLET | Freq: Four times a day (QID) | ORAL | Status: DC | PRN

## 2021-03-14 NOTE — ED Notes (Signed)
Pt asking to be discharged. Pt states he doesn't want to be admitted anymore. Opyd MD paged to come speak with pt.

## 2021-03-14 NOTE — H&P (Signed)
History and Physical    Zachary Winters UJW:[id number] DOB: 25-Apr-1981 DOA: 03/14/2021  PCP: Perlie Mayo, NP   Patient coming from: Home   Chief Complaint: Abdominal pain, N/V, somnolence   HPI: Zachary Winters is a 40 y.o. male with medical history significant for insulin-dependent diabetes mellitus, hypertension, and diabetic peripheral neuropathy, now presenting to emergency department for evaluation of abdominal pain, nausea, vomiting, and somnolence.  The patient reports that he had been in his usual state until the night of 03/11/2021 when he began to develop some general malaise and nausea.  By the next day, he was having some pain in the upper abdomen and recurrent bouts of nonbloody vomiting.  He has not been able to eat or drink in the past 2 days due to the symptoms.  Today, he had become somnolent, continued to have nausea and vomiting, and family brought him in for evaluation.  The patient denies any associated diarrhea, sick contacts, or fevers.  He denies any chest pain, cough, shortness of breath, rash or wound, headache, or neck stiffness.  He reports similar symptoms approximately 4 months ago that resolved within a couple days.  ED Course: Upon arrival to the ED, patient is found to be afebrile, saturating well on room air, tachypneic and tachycardic initially, and with stable blood pressure.  EKG features sinus tachycardia with rate 112 and nonspecific repolarization abnormality.  Chest x-ray negative for acute cardiopulmonary disease.  Noncontrast head CT negative for acute intracranial abnormality.  No acute findings on CT abdomen/pelvis.  Chemistry panel with glucose 254, potassium 3.3, and creatinine 1.28.  CBC notable for leukocytosis to 15,900.  Beta hydroxybutyrate was normal.  Lactic acid went from 2.2 to 3.5.  Urinalysis with glucosuria, ketonuria, proteinuria, and elevated specific gravity.  COVID-19 screening test was negative.  Blood and urine cultures were collected  and the patient was given 2 L of LR, Zofran, and Levaquin in the ED.  Review of Systems:  All other systems reviewed and apart from HPI, are negative.  Past Medical History:  Diagnosis Date   Diabetes mellitus without complication ([place])     History reviewed. No pertinent surgical history.  Social History:   reports that he has been smoking cigarettes. He has a 5.00 pack-year smoking history. He has never used smokeless tobacco. He reports current alcohol use. He reports current drug use. Frequency: 2.00 times per week. Drug: Marijuana.  Allergies  Allergen Reactions   Metformin Nausea And Vomiting   Penicillins Swelling    History reviewed. No pertinent family history.   Prior to Admission medications   Medication Sig Start Date End Date Taking? Authorizing Provider  atorvastatin (LIPITOR) 10 MG tablet Take 1 tablet (10 mg total) by mouth daily. 01/01/20  Yes Perlie Mayo, NP  cyclobenzaprine (FLEXERIL) 10 MG tablet Take 1 tablet (10 mg total) by mouth 2 (two) times daily as needed for muscle spasms. 10/16/19  Yes Gareth Morgan, MD  gabapentin (NEURONTIN) 100 MG capsule Take 1 capsule (100 mg total) by mouth at bedtime. 01/01/20  Yes Perlie Mayo, NP  HUMULIN 70/30 (70-30) 100 UNIT/ML injection 10 Units 2 (two) times daily with a meal. 11/21/20  Yes [provider]  ibuprofen (ADVIL,MOTRIN) 800 MG tablet Take 1 tablet (800 mg total) by mouth 3 (three) times daily. 04/30/14  Yes Glendell Docker, NP  lisinopril (ZESTRIL) 10 MG tablet Take 1 tablet (10 mg total) by mouth daily. 01/21/21  Yes Noreene Larsson, NP  blood  glucose meter kit and supplies Dispense based on patient and insurance preference. Check blood sugar four times daily as directed. (FOR ICD-10 E10.9, E11.9). 01/01/20   Perlie Mayo, NP  Insulin Glargine (LANTUS) 100 UNIT/ML Solostar Pen Inject 15 Units into the skin at bedtime. Patient not taking: No sig reported 01/01/20   Perlie Mayo, NP  insulin  lispro (HUMALOG KWIKPEN) 100 UNIT/ML KwikPen Inject 0-12 Units into the skin 3 (three) times daily before meals. Use your sliding scale to determine the correct dose. Patient not taking: No sig reported 01/21/21   Noreene Larsson, NP  Insulin Pen Needle (PEN NEEDLES) 32G X 4 MM MISC 1 each by Does not apply route 3 (three) times daily before meals. 01/21/21   Noreene Larsson, NP  Insulin Pen Needle 31G X 8 MM MISC 1 each by Does not apply route 3 (three) times daily before meals. 01/01/20   Perlie Mayo, NP    Physical Exam: Vitals:   03/14/21 1800 03/14/21 1830 03/14/21 1900 03/14/21 1930  BP: (!) 163/101 (!) 147/98 (!) 150/108 (!) 144/106  Pulse: (!) 107 (!) 105 99 (!) 107  Resp:  20  18  Temp:      TempSrc:      SpO2: 99% 100% 100% 99%  Weight:      Height:        Constitutional: NAD, calm  Eyes: PERTLA, lids and conjunctivae normal ENMT: Mucous membranes are moist. Posterior pharynx clear of any exudate or lesions.   Neck:  supple, no masses  Respiratory:  no wheezing, no crackles. No accessory muscle use.  Cardiovascular: Rate ~100 and regular. No extremity edema.  Abdomen: No distension, no tenderness, soft. Bowel sounds active.  Musculoskeletal: no clubbing / cyanosis. No joint deformity upper and lower extremities.   Skin: no significant rashes, lesions, ulcers. Warm, dry, well-perfused. Neurologic: CN 2-12 grossly intact. Sensation intact. Moving all extremities.  Psychiatric: Alert and oriented to person, place, and situation. Pleasant and cooperative.    Labs and Imaging on Admission: I have personally reviewed following labs and imaging studies  CBC: Recent Labs  Lab 03/14/21 1555  WBC 15.9*  NEUTROABS 13.3*  HGB 13.9  HCT 40.8  MCV 89.7  PLT 545   Basic Metabolic Panel: Recent Labs  Lab 03/14/21 1555 03/14/21 1717  NA 136 139  K 3.3* 3.9  CL 99 101  CO2 25 27  GLUCOSE 254* 170*  BUN 25* 23*  CREATININE 1.28* 1.16  CALCIUM 9.8 9.4   GFR: Estimated  Creatinine Clearance: 86.7 mL/min (by C-G formula based on SCr of 1.16 mg/dL). Liver Function Tests: Recent Labs  Lab 03/14/21 1555  AST 16  ALT 18  ALKPHOS 87  BILITOT 0.6  PROT 7.9  ALBUMIN 4.2   No results for input(s): LIPASE, AMYLASE in the last 168 hours. No results for input(s): AMMONIA in the last 168 hours. Coagulation Profile: Recent Labs  Lab 03/14/21 1555  INR 1.0   Cardiac Enzymes: No results for input(s): CKTOTAL, CKMB, CKMBINDEX, TROPONINI in the last 168 hours. BNP (last 3 results) No results for input(s): PROBNP in the last 8760 hours. HbA1C: No results for input(s): HGBA1C in the last 72 hours. CBG: Recent Labs  Lab 03/14/21 1537  GLUCAP 249*   Lipid Profile: No results for input(s): CHOL, HDL, LDLCALC, TRIG, CHOLHDL, LDLDIRECT in the last 72 hours. Thyroid Function Tests: No results for input(s): TSH, T4TOTAL, FREET4, T3FREE, THYROIDAB in the last 72 hours. Anemia  Panel: No results for input(s): VITAMINB12, FOLATE, FERRITIN, TIBC, IRON, RETICCTPCT in the last 72 hours. Urine analysis:    Component Value Date/Time   COLORURINE YELLOW 03/14/2021 1728   APPEARANCEUR CLEAR 03/14/2021 1728   LABSPEC 1.031 (H) 03/14/2021 1728   PHURINE 5.0 03/14/2021 1728   GLUCOSEU >=500 (A) 03/14/2021 1728   HGBUR MODERATE (A) 03/14/2021 1728   BILIRUBINUR NEGATIVE 03/14/2021 1728   KETONESUR 20 (A) 03/14/2021 1728   PROTEINUR >=300 (A) 03/14/2021 1728   NITRITE NEGATIVE 03/14/2021 1728   LEUKOCYTESUR NEGATIVE 03/14/2021 1728   Sepsis Labs: @LABRCNTIP (procalcitonin:4,lacticidven:4) ) Recent Results (from the past 240 hour(s))  Blood culture (routine single)     Status: None (Preliminary result)   Collection Time: 03/14/21  3:55 PM   Specimen: BLOOD LEFT FOREARM  Result Value Ref Range Status   Specimen Description   Final    BLOOD LEFT FOREARM BOTTLES DRAWN AEROBIC AND ANAEROBIC   Special Requests   Final    Blood Culture results may not be optimal due to  an inadequate volume of blood received in culture bottles Performed at [facility], [address], [place], [place] [address]    Culture PENDING  Incomplete   Report Status PENDING  Incomplete  Blood Culture (routine x 2)     Status: None (Preliminary result)   Collection Time: 03/14/21  4:08 PM   Specimen: BLOOD RIGHT FOREARM  Result Value Ref Range Status   Specimen Description   Final    BLOOD RIGHT FOREARM BOTTLES DRAWN AEROBIC AND ANAEROBIC   Special Requests   Final    Blood Culture adequate volume Performed at [facility], [address] [place], [place] [address]    Culture PENDING  Incomplete   Report Status PENDING  Incomplete  Resp Panel by RT-PCR (Flu A&B, Covid) Nasopharyngeal Swab     Status: None   Collection Time: 03/14/21  5:08 PM   Specimen: Nasopharyngeal Swab; Nasopharyngeal(NP) swabs in vial transport medium  Result Value Ref Range Status   SARS Coronavirus 2 by RT PCR NEGATIVE NEGATIVE Final    Comment: (NOTE) SARS-CoV-2 target nucleic acids are NOT DETECTED.  The SARS-CoV-2 RNA is generally detectable in upper respiratory specimens during the acute phase of infection. The lowest concentration of SARS-CoV-2 viral copies this assay can detect is 138 copies/mL. A negative result does not preclude SARS-Cov-2 infection and should not be used as the sole basis for treatment or other patient management decisions. A negative result may occur with  improper specimen collection/handling, submission of specimen other than nasopharyngeal swab, presence of viral mutation(s) within the areas targeted by this assay, and inadequate number of viral copies(<138 copies/mL). A negative result must be combined with clinical observations, patient history, and epidemiological information. The expected result is Negative.  Fact Sheet for Patients:  EntrepreneurPulse.com.au  Fact Sheet for Healthcare Providers:   IncredibleEmployment.be  This test is no t yet approved or cleared by the [place] FDA and  has been authorized for detection and/or diagnosis of SARS-CoV-2 by FDA under an Emergency Use Authorization (EUA). This EUA will remain  in effect (meaning this test can be used) for the duration of the COVID-19 declaration under Section 564(b)(1) of the Act, 21 U.S.C.section 360bbb-3(b)(1), unless the authorization is terminated  or revoked sooner.       Influenza A by PCR NEGATIVE NEGATIVE Final   Influenza B by PCR NEGATIVE NEGATIVE Final    Comment: (NOTE) The Xpert Xpress SARS-CoV-2/FLU/RSV plus assay is intended  as an aid in the diagnosis of influenza from Nasopharyngeal swab specimens and should not be used as a sole basis for treatment. Nasal washings and aspirates are unacceptable for Xpert Xpress SARS-CoV-2/FLU/RSV testing.  Fact Sheet for Patients: EntrepreneurPulse.com.au  Fact Sheet for Healthcare Providers: IncredibleEmployment.be  This test is not yet approved or cleared by the [place] FDA and has been authorized for detection and/or diagnosis of SARS-CoV-2 by FDA under an Emergency Use Authorization (EUA). This EUA will remain in effect (meaning this test can be used) for the duration of the COVID-19 declaration under Section 564(b)(1) of the Act, 21 U.S.C. section 360bbb-3(b)(1), unless the authorization is terminated or revoked.  Performed at [facility], [address] [place], [place] [address]      Radiological Exams on Admission: CT Head Wo Contrast  Result Date: 03/14/2021 CLINICAL DATA:  Mental status change, unknown cause. EXAM: CT HEAD WITHOUT CONTRAST TECHNIQUE: Contiguous axial images were obtained from the base of the skull through the vertex without intravenous contrast. COMPARISON:  Head CT 10/16/2019. FINDINGS: Brain: Cerebral volume is normal for age. There is no acute intracranial  hemorrhage. No demarcated cortical infarct. No extra-axial fluid collection. No evidence of intracranial mass. No midline shift. Vascular: No hyperdense vessel.  Atherosclerotic calcifications Skull: Normal. Negative for fracture or focal lesion. Sinuses/Orbits: Visualized orbits show no acute finding. Chronic, healed fracture deformity of the left orbital floor. No significant paranasal sinus disease at the imaged levels. IMPRESSION: No evidence of acute intracranial abnormality Electronically Signed   By: Kellie Simmering DO   On: 03/14/2021 16:39   CT ABDOMEN PELVIS W CONTRAST  Result Date: 03/14/2021 CLINICAL DATA:  Intermittent abdominal pain with nausea and vomiting for 2 days. EXAM: CT ABDOMEN AND PELVIS WITH CONTRAST TECHNIQUE: Multidetector CT imaging of the abdomen and pelvis was performed using the standard protocol following bolus administration of intravenous contrast. CONTRAST:  152m OMNIPAQUE IOHEXOL 300 MG/ML  SOLN COMPARISON:  Overlapping portions of CT chest from 10/16/2019 FINDINGS: Lower chest: Unremarkable Hepatobiliary: Unremarkable Pancreas: Unremarkable Spleen: Unremarkable Adrenals/Urinary Tract: Unremarkable Stomach/Bowel: Unremarkable Vascular/Lymphatic: Mild aortoiliac atherosclerotic vascular disease. No adenopathy. Reproductive: Unremarkable Other: No supplemental non-categorized findings. Musculoskeletal: Unremarkable IMPRESSION: 1. A cause for the patient's abdominal pain is not identified. 2. Mild aortoiliac atherosclerotic vascular disease. Aortic Atherosclerosis (ICD10-I70.0). Electronically Signed   By: WVan ClinesM.D.   On: 03/14/2021 18:15   DG Chest Port 1 View  Result Date: 03/14/2021 CLINICAL DATA:  Dizziness with weakness diabetic EXAM: PORTABLE CHEST 1 VIEW COMPARISON:  Chest radiograph 11/19/2020 FINDINGS: Stable cardiomediastinal contours. The lungs are clear. No pneumothorax or pleural effusion. No acute finding in the visualized skeleton. IMPRESSION: No  acute cardiopulmonary process. Electronically Signed   By: NAudie PintoM.D.   On: 03/14/2021 16:42    EKG: Independently reviewed. Sinus tachycardia, rate 112, non-specific repolarization abnormality.   Assessment/Plan   1. Lactic acidosis; SIRS  - Presents with 3 days of abdominal pain and N/V, one day somnolence, and is found to have tachycardia, leukocytosis, and elevated lactate  - No apparent infectious process, is rapidly improving with IVF hydration and antiemetics, plan to continue supportive care, trend lactate, follow cultures and clinical course    2. Abdominal pain with N/V   - Presents with 3 days of abdominal discomfort and N/V  - No acute findings on CT abdomen/pelvis, exam is benign, and patient reports improvement with Zofran and asking for water  - Check lipase, continue supportive care, advance diet as  tolerated   3. Insulin-dependent DM - A1c was 13.4% in November 2020  - Continue CBG checks and insulin    4. Acute encephalopathy   - Resolved  - Presented with somnolence and difficulty answering questions, had no acute findings on head CT  - TSH, ammonia level, and UDS pending, but now is alert and fully oriented   5. Hypertension  - Continue lisinopril     DVT prophylaxis: Lovenox  Code Status: Full  Level of Care: Level of care: Telemetry Family Communication: sister updated at bedside Disposition Plan: Patient is from: home  Anticipated d/c is to: Home  Anticipated d/c date is: 03/15/21 Patient currently: Pending tolerance of adequate oral intake  Consults called: None  Admission status: Observation     Vianne Bulls, MD [facility]  03/14/2021, 8:08 PM

## 2021-03-14 NOTE — ED Provider Notes (Signed)
[facility] EMERGENCY DEPARTMENT Provider Note   CSN: [id number] Arrival date & time: 03/14/21  1506     History Chief Complaint  Patient presents with   Hyperglycemia    Zachary Winters is a 40 y.o. male.  HPI   This patient is a 40 year old male, he has a history of diabetes with associated peripheral neuropathy, history of hypertension and is prescribed medications including insulin, atorvastatin and lisinopril.  This patient presents to the hospital today with a complaint of altered mental status and hyperglycemia.  The patient was in his usual state of health until about 48 hours ago when he started to develop nausea and vomiting.  Evidently the patient has not been able to eat in a couple of days, he has had a progressive dry mouth, he has been urinating, he has had generalized weakness and according to his sister who is an additional historian and arrives with him he was totally normal 1 week ago but starting yesterday started to vomit, he has been taking his insulin according to the patient's report though the patient to be fair is somnolent, able to arouse but has difficulty answering questions with clarity.  He denies chest pain abdominal pain or shortness of breath and has not had any recent cough or fever.  This sister endorses that he did have a positive COVID test at home a month ago when his family tested positive.  He lives with his family.  He is not currently employed.  He does not drink or smoke cigarettes but does smoke occasional marijuana.  He has not recently had any marijuana and has never had the symptoms with marijuana in the past.  There is no diarrhea constipation or rashes to the skin.  Symptoms are persistent severe and became particularly bad today.  Report from the patient is that he was measuring too high to measure at home on his home glucometer.  Past Medical History:  Diagnosis Date   Diabetes mellitus without complication [facility])     Patient Active  Problem List   Diagnosis Date Noted   Lactic acidosis 03/14/2021   SIRS (systemic inflammatory response syndrome) ([place]) 03/14/2021   Abdominal pain with vomiting 03/14/2021   Type 2 diabetes mellitus with diabetic neuropathy ([place]) 01/01/2020   Hyperlipidemia associated with type 2 diabetes mellitus ([place]) 01/01/2020   Hypertension associated with diabetes ([place]) 10/31/2019   Essential hypertension 10/31/2019    History reviewed. No pertinent surgical history.     History reviewed. No pertinent family history.  Social History   Tobacco Use   Smoking status: Current Every Day Smoker    Packs/day: 0.25    Years: 20.00    Pack years: 5.00    Types: Cigarettes   Smokeless tobacco: Never Used  Vaping Use   Vaping Use: Never used  Substance Use Topics   Alcohol use: Yes   Drug use: Yes    Frequency: 2.0 times per week    Types: Marijuana    Home Medications Prior to Admission medications   Medication Sig Start Date End Date Taking? Authorizing Provider  atorvastatin (LIPITOR) 10 MG tablet Take 1 tablet (10 mg total) by mouth daily. 01/01/20  Yes Perlie Mayo, NP  cyclobenzaprine (FLEXERIL) 10 MG tablet Take 1 tablet (10 mg total) by mouth 2 (two) times daily as needed for muscle spasms. 10/16/19  Yes Gareth Morgan, MD  gabapentin (NEURONTIN) 100 MG capsule Take 1 capsule (100 mg total) by mouth at bedtime. 01/01/20  Yes Jerelene Redden,  Barbee Cough, NP  HUMULIN 70/30 (70-30) 100 UNIT/ML injection 10 Units 2 (two) times daily with a meal. 11/21/20  Yes [provider]  ibuprofen (ADVIL,MOTRIN) 800 MG tablet Take 1 tablet (800 mg total) by mouth 3 (three) times daily. 04/30/14  Yes Glendell Docker, NP  lisinopril (ZESTRIL) 10 MG tablet Take 1 tablet (10 mg total) by mouth daily. 01/21/21  Yes Noreene Larsson, NP  blood glucose meter kit and supplies Dispense based on patient and insurance preference. Check blood sugar four times daily as directed. (FOR ICD-10 E10.9, E11.9).  01/01/20   Perlie Mayo, NP  Insulin Glargine (LANTUS) 100 UNIT/ML Solostar Pen Inject 15 Units into the skin at bedtime. Patient not taking: No sig reported 01/01/20   Perlie Mayo, NP  insulin lispro (HUMALOG KWIKPEN) 100 UNIT/ML KwikPen Inject 0-12 Units into the skin 3 (three) times daily before meals. Use your sliding scale to determine the correct dose. Patient not taking: No sig reported 01/21/21   Noreene Larsson, NP  Insulin Pen Needle (PEN NEEDLES) 32G X 4 MM MISC 1 each by Does not apply route 3 (three) times daily before meals. 01/21/21   Noreene Larsson, NP  Insulin Pen Needle 31G X 8 MM MISC 1 each by Does not apply route 3 (three) times daily before meals. 01/01/20   Perlie Mayo, NP    Allergies    Metformin and Penicillins  Review of Systems   Review of Systems  All other systems reviewed and are negative.   Physical Exam Updated Vital Signs BP (!) 144/106    Pulse (!) 107    Temp 98.3 F (36.8 C) (Oral)    Resp 18    Ht 1.905 m (6' 3" )    Wt 70.3 kg    SpO2 99%    BMI 19.37 kg/m   Physical Exam Vitals and nursing note reviewed.  Constitutional:      General: He is not in acute distress.    Appearance: He is well-developed. He is diaphoretic.  HENT:     Head: Normocephalic and atraumatic.     Mouth/Throat:     Mouth: Mucous membranes are dry.     Pharynx: No oropharyngeal exudate.  Eyes:     General: No scleral icterus.       Right eye: No discharge.        Left eye: No discharge.     Conjunctiva/sclera: Conjunctivae normal.     Pupils: Pupils are equal, round, and reactive to light.  Neck:     Thyroid: No thyromegaly.     Vascular: No JVD.  Cardiovascular:     Rate and Rhythm: Regular rhythm. Tachycardia present.     Heart sounds: Normal heart sounds. No murmur heard. No friction rub. No gallop.   Pulmonary:     Effort: Pulmonary effort is normal. No respiratory distress.     Breath sounds: Normal breath sounds. No wheezing or rales.  Abdominal:      General: Bowel sounds are normal. There is no distension.     Palpations: Abdomen is soft. There is no mass.     Tenderness: There is no abdominal tenderness.  Musculoskeletal:        General: No tenderness or signs of injury. Normal range of motion.     Cervical back: Normal range of motion and neck supple.     Right lower leg: No edema.     Left lower leg: No edema.  Lymphadenopathy:  Cervical: No cervical adenopathy.  Skin:    General: Skin is warm.     Findings: No erythema or rash.  Neurological:     Mental Status: He is alert.     Coordination: Coordination normal.     Comments: Generalized weakness but able to follow commands and move all 4 extremities.  Somnolent but arousable  Psychiatric:        Behavior: Behavior normal.     ED Results / Procedures / Treatments   Labs (all labs ordered are listed, but only abnormal results are displayed) Labs Reviewed  LACTIC ACID, PLASMA - Abnormal; Notable for the following components:      Result Value   Lactic Acid, Venous 2.2 (*)    All other components within normal limits  LACTIC ACID, PLASMA - Abnormal; Notable for the following components:   Lactic Acid, Venous 3.5 (*)    All other components within normal limits  COMPREHENSIVE METABOLIC PANEL - Abnormal; Notable for the following components:   Potassium 3.3 (*)    Glucose, Bld 254 (*)    BUN 25 (*)    Creatinine, Ser 1.28 (*)    All other components within normal limits  CBC WITH DIFFERENTIAL/PLATELET - Abnormal; Notable for the following components:   WBC 15.9 (*)    Neutro Abs 13.3 (*)    All other components within normal limits  URINALYSIS, ROUTINE W REFLEX MICROSCOPIC - Abnormal; Notable for the following components:   Specific Gravity, Urine 1.031 (*)    Glucose, UA >=500 (*)    Hgb urine dipstick MODERATE (*)    Ketones, ur 20 (*)    Protein, ur >=300 (*)    All other components within normal limits  BLOOD GAS, VENOUS - Abnormal; Notable for the  following components:   pH, Ven 7.497 (*)    pCO2, Ven 35.3 (*)    Acid-Base Excess 3.9 (*)    All other components within normal limits  BASIC METABOLIC PANEL - Abnormal; Notable for the following components:   Glucose, Bld 170 (*)    BUN 23 (*)    All other components within normal limits  CBG MONITORING, ED - Abnormal; Notable for the following components:   Glucose-Capillary 249 (*)    All other components within normal limits  CULTURE, BLOOD (SINGLE)  CULTURE, BLOOD (ROUTINE X 2)  RESP PANEL BY RT-PCR (FLU A&B, COVID) ARPGX2  URINE CULTURE  PROTIME-INR  APTT  BETA-HYDROXYBUTYRIC ACID  BETA-HYDROXYBUTYRIC ACID    EKG EKG Interpretation  Date/Time:  Sunday March 14 2021 15:35:54 EDT Ventricular Rate:  112 PR Interval:    QRS Duration: 77 QT Interval:  299 QTC Calculation: 409 R Axis:   90 Text Interpretation: Sinus tachycardia Borderline right axis deviation Nonspecific repol abnormality, inferior leads Baseline wander in lead(s) V1 V2 since last tracing no significant change Confirmed by Noemi Chapel [phone]) on 03/14/2021 3:50:22 PM   Radiology CT Head Wo Contrast  Result Date: 03/14/2021 CLINICAL DATA:  Mental status change, unknown cause. EXAM: CT HEAD WITHOUT CONTRAST TECHNIQUE: Contiguous axial images were obtained from the base of the skull through the vertex without intravenous contrast. COMPARISON:  Head CT 10/16/2019. FINDINGS: Brain: Cerebral volume is normal for age. There is no acute intracranial hemorrhage. No demarcated cortical infarct. No extra-axial fluid collection. No evidence of intracranial mass. No midline shift. Vascular: No hyperdense vessel.  Atherosclerotic calcifications Skull: Normal. Negative for fracture or focal lesion. Sinuses/Orbits: Visualized orbits show no acute finding. Chronic, healed fracture  deformity of the left orbital floor. No significant paranasal sinus disease at the imaged levels. IMPRESSION: No evidence of acute intracranial  abnormality Electronically Signed   By: Kellie Simmering DO   On: 03/14/2021 16:39   CT ABDOMEN PELVIS W CONTRAST  Result Date: 03/14/2021 CLINICAL DATA:  Intermittent abdominal pain with nausea and vomiting for 2 days. EXAM: CT ABDOMEN AND PELVIS WITH CONTRAST TECHNIQUE: Multidetector CT imaging of the abdomen and pelvis was performed using the standard protocol following bolus administration of intravenous contrast. CONTRAST:  173m OMNIPAQUE IOHEXOL 300 MG/ML  SOLN COMPARISON:  Overlapping portions of CT chest from 10/16/2019 FINDINGS: Lower chest: Unremarkable Hepatobiliary: Unremarkable Pancreas: Unremarkable Spleen: Unremarkable Adrenals/Urinary Tract: Unremarkable Stomach/Bowel: Unremarkable Vascular/Lymphatic: Mild aortoiliac atherosclerotic vascular disease. No adenopathy. Reproductive: Unremarkable Other: No supplemental non-categorized findings. Musculoskeletal: Unremarkable IMPRESSION: 1. A cause for the patient's abdominal pain is not identified. 2. Mild aortoiliac atherosclerotic vascular disease. Aortic Atherosclerosis (ICD10-I70.0). Electronically Signed   By: WVan ClinesM.D.   On: 03/14/2021 18:15   DG Chest Port 1 View  Result Date: 03/14/2021 CLINICAL DATA:  Dizziness with weakness diabetic EXAM: PORTABLE CHEST 1 VIEW COMPARISON:  Chest radiograph 11/19/2020 FINDINGS: Stable cardiomediastinal contours. The lungs are clear. No pneumothorax or pleural effusion. No acute finding in the visualized skeleton. IMPRESSION: No acute cardiopulmonary process. Electronically Signed   By: NAudie PintoM.D.   On: 03/14/2021 16:42    Procedures .Critical Care Performed by: MNoemi Chapel MD Authorized by: MNoemi Chapel MD   Critical care provider statement:    Critical care time (minutes):  35   Critical care time was exclusive of:  Separately billable procedures and treating other patients and teaching time   Critical care was necessary to treat or prevent imminent or  life-threatening deterioration of the following conditions:  Dehydration and metabolic crisis   Critical care was time spent personally by me on the following activities:  Blood draw for specimens, development of treatment plan with patient or surrogate, discussions with consultants, evaluation of patient's response to treatment, examination of patient, obtaining history from patient or surrogate, ordering and performing treatments and interventions, ordering and review of laboratory studies, ordering and review of radiographic studies, pulse oximetry, re-evaluation of patient's condition and review of old charts     Medications Ordered in ED Medications  levofloxacin (LEVAQUIN) IVPB 750 mg (750 mg Intravenous New Bag/Given 03/14/21 1945)  0.9 %  sodium chloride infusion ( Intravenous New Bag/Given 03/14/21 1945)  lactated ringers bolus 2,000 mL (0 mLs Intravenous Stopped 03/14/21 1713)  ondansetron (ZOFRAN) injection 4 mg (4 mg Intravenous Given 03/14/21 1604)  iohexol (OMNIPAQUE) 300 MG/ML solution 100 mL (100 mLs Intravenous Contrast Given 03/14/21 1750)    ED Course  I have reviewed the triage vital signs and the nursing notes.  Pertinent labs & imaging results that were available during my care of the patient were reviewed by me and considered in my medical decision making (see chart for details).  Clinical Course as of 03/14/21 15397 SNancy FetterMar 20, 2022  1742 The patient had been admitted on November 19, 2020 at an outside hospital for similar symptoms, at that point it was unclear what was causing the intractable nausea and vomiting, there did appear to be different possible etiologies including gastroparesis related to diabetes, hyperglycemia, cannabis hyperemesis syndrome as well. [BM]    Clinical Course User Index [BM] MNoemi Chapel MD   MDM Rules/Calculators/A&P  Patient's EKG is unchanged from prior EKGs.  He is tachycardic, diaphoretic, his blood pressure is  currently 102/68 and I suspect that he is significantly dehydrated.  His blood sugar came in at around 250 making it questionable as to whether this is truly going to be diabetic ketoacidosis or some other underlying driving concerned.  Will need to be evaluated for possible sepsis, DKA, it does not appear that this is toxic related, the family members have all been in good health otherwise.  The patient is critically ill-appearing and needing multiple boluses of IV fluid and a broad laboratory work-up  The patient has significant increase in the lactic acid from just over 2-3.5.  There is ketones in the urine but no anion gap acidosis, there is a glucose of 373 basic metabolic panel, the pH is 7.497, this is all suggestive that the patient has a progressive illness with regarding his dehydration and poor oral intake from what ever kind of cyclic nausea and vomiting he has.  He has had multiple boluses of IV fluids, antiemetics and even a CT scan to look for signs of infection, there is no obvious pneumonia intra-abdominal infection or urinary infection.  Levaquin has been ordered  Discussed with the hospitalist Dr. Myna Hidalgo who will admit.  Final Clinical Impression(s) / ED Diagnoses Final diagnoses:  Lactic acidosis  Intractable nausea and vomiting  Hyperglycemia  Dehydration      Noemi Chapel, MD 03/14/21 [phone]

## 2021-03-14 NOTE — ED Triage Notes (Signed)
Pt to er with sister, states that pt is here because he can't stay awake, states that they have been checking his sugar and it only reads high, pt arousable to verbal stim.

## 2021-03-15 DIAGNOSIS — I10 Essential (primary) hypertension: Secondary | ICD-10-CM

## 2021-03-15 DIAGNOSIS — R1110 Vomiting, unspecified: Secondary | ICD-10-CM

## 2021-03-15 DIAGNOSIS — E860 Dehydration: Secondary | ICD-10-CM

## 2021-03-15 DIAGNOSIS — E872 Acidosis: Secondary | ICD-10-CM

## 2021-03-15 DIAGNOSIS — Z794 Long term (current) use of insulin: Secondary | ICD-10-CM

## 2021-03-15 DIAGNOSIS — R6510 Systemic inflammatory response syndrome (SIRS) of non-infectious origin without acute organ dysfunction: Secondary | ICD-10-CM

## 2021-03-15 DIAGNOSIS — E1140 Type 2 diabetes mellitus with diabetic neuropathy, unspecified: Secondary | ICD-10-CM

## 2021-03-15 DIAGNOSIS — R109 Unspecified abdominal pain: Secondary | ICD-10-CM

## 2021-03-15 LAB — CBC
HCT: 37 % — ABNORMAL LOW (ref 39.0–52.0)
Hemoglobin: 12.5 g/dL — ABNORMAL LOW (ref 13.0–17.0)
MCH: 30.8 pg (ref 26.0–34.0)
MCHC: 33.8 g/dL (ref 30.0–36.0)
MCV: 91.1 fL (ref 80.0–100.0)
Platelets: 234 K/uL (ref 150–400)
RBC: 4.06 MIL/uL — ABNORMAL LOW (ref 4.22–5.81)
RDW: 12.7 % (ref 11.5–15.5)
WBC: 11.3 K/uL — ABNORMAL HIGH (ref 4.0–10.5)
nRBC: 0 % (ref 0.0–0.2)

## 2021-03-15 LAB — BASIC METABOLIC PANEL
Anion gap: 11 (ref 5–15)
BUN: 21 mg/dL — ABNORMAL HIGH (ref 6–20)
CO2: 26 mmol/L (ref 22–32)
Calcium: 9 mg/dL (ref 8.9–10.3)
Chloride: 102 mmol/L (ref 98–111)
Creatinine, Ser: 0.9 mg/dL (ref 0.61–1.24)
GFR, Estimated: >60 mL/min (ref >60)
Glucose, Bld: 111 mg/dL — ABNORMAL HIGH (ref 70–99)
Potassium: 3.1 mmol/L — ABNORMAL LOW (ref 3.5–5.1)
Sodium: 139 mmol/L (ref 135–145)

## 2021-03-15 LAB — GLUCOSE, CAPILLARY
Glucose-Capillary: 104 mg/dL — ABNORMAL HIGH (ref 70–99)
Glucose-Capillary: 105 mg/dL — ABNORMAL HIGH (ref 70–99)
Glucose-Capillary: 138 mg/dL — ABNORMAL HIGH (ref 70–99)
Glucose-Capillary: 94 mg/dL (ref 70–99)

## 2021-03-15 LAB — HEMOGLOBIN A1C
Hgb A1c MFr Bld: 10.2 % — ABNORMAL HIGH (ref 4.8–5.6)
Mean Plasma Glucose: 246.04 mg/dL

## 2021-03-15 LAB — LACTIC ACID, PLASMA: Lactic Acid, Venous: 0.8 mmol/L (ref 0.5–1.9)

## 2021-03-15 LAB — RAPID URINE DRUG SCREEN, HOSP PERFORMED
Amphetamines: NOT DETECTED
Barbiturates: NOT DETECTED
Benzodiazepines: NOT DETECTED
Cocaine: NOT DETECTED
Opiates: NOT DETECTED
Tetrahydrocannabinol: POSITIVE — AB

## 2021-03-15 LAB — HIV ANTIBODY (ROUTINE TESTING W REFLEX): HIV Screen 4th Generation wRfx: NONREACTIVE

## 2021-03-15 LAB — TSH: TSH: 0.447 u[IU]/mL (ref 0.350–4.500)

## 2021-03-15 MED ORDER — HUMULIN 70/30 (70-30) 100 UNIT/ML ~~LOC~~ SUSP: 18.0000 [IU] | Freq: Two times a day (BID) | SUBCUTANEOUS | 10 refills | Status: DC

## 2021-03-15 MED ORDER — "INSULIN SYRINGE-NEEDLE U-100 31G X 5/16"" 0.3 ML MISC": 3 refills | Status: DC

## 2021-03-15 MED ORDER — LISINOPRIL 10 MG PO TABS: 10.0000 mg | ORAL_TABLET | Freq: Every day | ORAL | 2 refills | Status: DC

## 2021-03-15 NOTE — Progress Notes (Signed)
Inpatient Diabetes Program Recommendations  AACE/ADA: New Consensus Statement on Inpatient Glycemic Control   Target Ranges:  Prepandial:   less than 140 mg/dL      Peak postprandial:   less than 180 mg/dL (1-2 hours)      Critically ill patients:  140 - 180 mg/dL   Results for Zachary Winters, Zachary Winters (MRN [id number]) as of 03/15/2021 10:37  Ref. Range 03/14/2021 15:37 03/14/2021 20:50 03/14/2021 23:58 03/15/2021 03:49 03/15/2021 07:24  Glucose-Capillary Latest Ref Range: 70 - 99 mg/dL 813 (H) 887 (H) 195 (H) 104 (H) 94  Results for Zachary Winters, Zachary Winters (MRN [id number]) as of 03/15/2021 10:37  Ref. Range 03/14/2021 17:17  Hemoglobin A1C Latest Ref Range: 4.8 - 5.6 % 10.2 (H)   Review of Glycemic Control  Diabetes history: DM Outpatient Diabetes medications: 70/30 15 units BID Current orders for Inpatient glycemic control: Lantus 10 units QHS, Novolog 0-9 units Q4H  Inpatient Diabetes Program Recommendations:    HbgA1C:  A1C 10.2% on 03/14/21 indicating an average glucose of 246 mg/dl over the past 2-3 months.   NOTE: Spoke with patient over the phone about diabetes and home regimen for diabetes control. Patient reports being followed by PCP for diabetes management and was last seen by PCP office on 01/21/21. Patient states that he is taking 70/30 15 units BID as an outpatient for diabetes control. Patient reports that he is using vial and syringe for insulin injections.  Noted patient does not have any insurance listed but he notes that he just recently got Medicaid.  Patient reports glucose is usually in the 200's mg/dl.  Inquired about prior A1C and patient reports not being able to recall last A1C value. Discussed A1C results (10.2% on 03/14/21) and explained that current A1C indicates an average glucose of 246 mg/dl over the past 2-3 months. Discussed glucose and A1C goals. Discussed importance of checking CBGs and maintaining good CBG control to prevent long-term and short-term complications. Explained how  hyperglycemia leads to damage within blood vessels which lead to the common complications seen with uncontrolled diabetes. Stressed to the patient the importance of improving glycemic control to prevent further complications from uncontrolled diabetes.  Encouraged patient to check glucose as directed by provider and to keep a log book of glucose readings and DM medication taken which patient will need to take to doctor appointments.  Discussed FreeStyle Libre2 and explained how it could provide more data for him and for his PCP to use to make adjustments to get DM under better control. Encouraged patient to ask PCP about prescribing with FreeStyle Libre2 if interested. Patient reports that he will need Rx for: 70/30 vials, syringes, and Lisinopril at time of discharge. Patient verbalized understanding of information discussed and reports no further questions at this time related to diabetes.  Thanks, Orlando Penner, RN, MSN, CDE Diabetes Coordinator Inpatient Diabetes Program [phone] (Team Pager)

## 2021-03-15 NOTE — Progress Notes (Signed)
Pt has discharge orders, discharge teaching given and no further questions at this time. IV removed. Pt will be wheeled down to main entrance via w/c.

## 2021-03-15 NOTE — Plan of Care (Signed)
Admitted from ED this shift. Oriented to room and call bell. Settled in room and went to sleep. No concerns voiced. Problem: Education: Goal: Knowledge of General Education information will improve Description: Including pain rating scale, medication(s)/side effects and non-pharmacologic comfort measures Outcome: Progressing   Problem: Health Behavior/Discharge Planning: Goal: Ability to manage health-related needs will improve Outcome: Progressing   Problem: Clinical Measurements: Goal: Ability to maintain clinical measurements within normal limits will improve Outcome: Progressing Goal: Will remain free from infection Outcome: Progressing Goal: Diagnostic test results will improve Outcome: Progressing Goal: Respiratory complications will improve Outcome: Progressing Goal: Cardiovascular complication will be avoided Outcome: Progressing   Problem: Activity: Goal: Risk for activity intolerance will decrease Outcome: Progressing   Problem: Nutrition: Goal: Adequate nutrition will be maintained Outcome: Progressing   Problem: Coping: Goal: Level of anxiety will decrease Outcome: Progressing   Problem: Elimination: Goal: Will not experience complications related to bowel motility Outcome: Progressing Goal: Will not experience complications related to urinary retention Outcome: Progressing   Problem: Pain Managment: Goal: General experience of comfort will improve Outcome: Progressing   Problem: Safety: Goal: Ability to remain free from injury will improve Outcome: Progressing   Problem: Skin Integrity: Goal: Risk for impaired skin integrity will decrease Outcome: Progressing

## 2021-03-15 NOTE — Discharge Summary (Signed)
Physician Discharge Summary  URA HAUSEN Winters:[id number] DOB: [birth year] DOA: 03/14/2021  PCP: Zachary Finner, NP  Admit date: 03/14/2021 Discharge date: 03/15/2021  Time spent: 35 minutes  Recommendations for Outpatient Follow-up:  1. Repeat basic metabolic panel electrolytes and renal function 2. Reassess blood pressure and adjust antihypertensive regimen as needed 3. Continue to follow CBGs/A1C and further adjust hypoglycemic regimen as required   Discharge Diagnoses:  Principal Problem:   Lactic acidosis Active Problems:   Hypertension associated with diabetes (HCC)   Type 2 diabetes mellitus with diabetic neuropathy (HCC)   SIRS (systemic inflammatory response syndrome) (HCC)   Abdominal pain with vomiting   Somnolence   Dehydration   Discharge Condition: Stable and improved.  Discharged home with instruction to follow-up with PCP in 10 days.  CODE STATUS: Full code.  Diet recommendation: Heart healthy modified carbohydrate diet.  Filed Weights   03/14/21 1526 03/14/21 2317 03/15/21 0600  Weight: 70.3 kg 66.5 kg 66.5 kg    History of present illness:  As per H&P written by Dr. Antionette Winters on 03/14/2021 Zachary Winters is a 40 y.o. male with medical history significant for insulin-dependent diabetes mellitus, hypertension, and diabetic peripheral neuropathy, now presenting to emergency department for evaluation of abdominal pain, nausea, vomiting, and somnolence.  The patient reports that he had been in his usual state until the night of 03/11/2021 when he began to develop some general malaise and nausea.  By the next day, he was having some pain in the upper abdomen and recurrent bouts of nonbloody vomiting.  He has not been able to eat or drink in the past 2 days due to the symptoms.  Today, he had become somnolent, continued to have nausea and vomiting, and family brought him in for evaluation.  The patient denies any associated diarrhea, sick contacts, or fevers.  He denies any  chest pain, cough, shortness of breath, rash or wound, headache, or neck stiffness.  He reports similar symptoms approximately 4 months ago that resolved within a couple days.  ED Course: Upon arrival to the ED, patient is found to be afebrile, saturating well on room air, tachypneic and tachycardic initially, and with stable blood pressure.  EKG features sinus tachycardia with rate 112 and nonspecific repolarization abnormality.  Chest x-ray negative for acute cardiopulmonary disease.  Noncontrast head CT negative for acute intracranial abnormality.  No acute findings on CT abdomen/pelvis.  Chemistry panel with glucose 254, potassium 3.3, and creatinine 1.28.  CBC notable for leukocytosis to 15,900.  Beta hydroxybutyrate was normal.  Lactic acid went from 2.2 to 3.5.  Urinalysis with glucosuria, ketonuria, proteinuria, and elevated specific gravity.  COVID-19 screening test was negative.  Blood and urine cultures were collected and the patient was given 2 L of LR, Zofran, and Levaquin in the ED.  Hospital Course:  1-lactic acidosis/SIRS -In the setting of dehydration from uncontrolled hyperglycemia and presumed viral gastroenteritis. -Symptoms completely resolve stabilized after aggressive hydration and electrolyte repletion. -Patient without nausea, vomiting, abdominal pain, shortness of breath or any other complaints -Work-up unrevealing of acute systemic infection sources. -Patient advised to maintain adequate hydration, to be compliant with insulin therapy and to follow-up with PCP in 10 days. -Tolerating diet at time of discharge without difficulties.  2-abdominal pain with nausea/vomiting -Resolved -presumed due to viral gastroenteritis -advised to keep himself well hydrated  3-uncontrolled type 2 diabetes mellitus -A1c 10.2 -Insulin therapy has been adjusted -Patient advised to follow modified carbohydrate diet -Prescription provided and instructed to  follow-up with PCP as an  outpatient.  4-HTN -antihypertensive meds refill it -asked to follow heart healthy diet -reassess BP at follow up visit and adjust antihypertensive regimen as needed.  5-hypokalemia -In the setting of GI losses -Electrolytes repleted -Patient discharge on lisinopril for blood pressure control which will also regulate and help stabilizing potassium long-term. -Repeat basic metabolic panel at follow up visit.  6-acute encephalopathy -Resolved -Mentation within normal limits at discharge.  Procedures:  See below for x-ray reports.  Consultations:  None  Discharge Exam: Vitals:   03/15/21 1056 03/15/21 1403  BP:  (!) 158/89  Pulse: 94 94  Resp:  17  Temp: 98 F (36.7 C) 98.6 F (37 C)  SpO2: 100% 100%    General: Afebrile, no chest pain, no nausea, no vomiting.  Reports feeling good and would like to go home.  Tolerating diet without problems. Cardiovascular: S1 and S2, no rubs, no gallops, no murmurs. Respiratory: Good air movement bilaterally, no wheezing, no crackles, no use of accessory muscles.  Good saturation on room air. Abdomen: Soft, nontender, positive bowel sounds Extremities: No cyanosis or clubbing.  Discharge Instructions   Discharge Instructions    Diet - low sodium heart healthy   Complete by: As directed    Diet Carb Modified   Complete by: As directed    Discharge instructions   Complete by: As directed    Maintain adequate hydration Take medications as prescribed Arrange follow-up with PCP in 10 days Avoid skipping meals Be compliant with your insulin therapy and follow low carbohydrates diet     Allergies as of 03/15/2021      Reactions   Metformin Nausea And Vomiting   Penicillins Swelling      Medication List    STOP taking these medications   ibuprofen 800 MG tablet Commonly known as: ADVIL   insulin glargine 100 UNIT/ML Solostar Pen Commonly known as: LANTUS   insulin lispro 100 UNIT/ML KwikPen Commonly known as: HumaLOG  KwikPen   Insulin Pen Needle 31G X 8 MM Misc   Pen Needles 32G X 4 MM Misc     TAKE these medications   atorvastatin 10 MG tablet Commonly known as: LIPITOR Take 1 tablet (10 mg total) by mouth daily.   blood glucose meter kit and supplies Dispense based on patient and insurance preference. Check blood sugar four times daily as directed. (FOR ICD-10 E10.9, E11.9).   cyclobenzaprine 10 MG tablet Commonly known as: FLEXERIL Take 1 tablet (10 mg total) by mouth 2 (two) times daily as needed for muscle spasms.   gabapentin 100 MG capsule Commonly known as: NEURONTIN Take 1 capsule (100 mg total) by mouth at bedtime.   HumuLIN 70/30 (70-30) 100 UNIT/ML injection Generic drug: insulin NPH-regular Human Inject 18 Units into the skin 2 (two) times daily with a meal. What changed:   how much to take  how to take this   Insulin Syringe-Needle U-100 31G X 5/16" 0.3 ML Misc Commonly known as: GNP Insulin Syringes 31Gx5/16" Use as instructed to inject insulin twice a day.   lisinopril 10 MG tablet Commonly known as: ZESTRIL Take 1 tablet (10 mg total) by mouth daily.      Allergies  Allergen Reactions  . Metformin Nausea And Vomiting  . Penicillins Swelling    Follow-up Information    Perlie Mayo, NP. Schedule an appointment as soon as possible for a visit in 10 day(s).   Specialty: Family Medicine Contact information: [place]  [place] [address] [phone]                The results of significant diagnostics from this hospitalization (including imaging, microbiology, ancillary and laboratory) are listed below for reference.    Significant Diagnostic Studies: CT Head Wo Contrast  Result Date: 03/14/2021 CLINICAL DATA:  Mental status change, unknown cause. EXAM: CT HEAD WITHOUT CONTRAST TECHNIQUE: Contiguous axial images were obtained from the base of the skull through the vertex without intravenous contrast. COMPARISON:  Head CT 10/16/2019.  FINDINGS: Brain: Cerebral volume is normal for age. There is no acute intracranial hemorrhage. No demarcated cortical infarct. No extra-axial fluid collection. No evidence of intracranial mass. No midline shift. Vascular: No hyperdense vessel.  Atherosclerotic calcifications Skull: Normal. Negative for fracture or focal lesion. Sinuses/Orbits: Visualized orbits show no acute finding. Chronic, healed fracture deformity of the left orbital floor. No significant paranasal sinus disease at the imaged levels. IMPRESSION: No evidence of acute intracranial abnormality Electronically Signed   By: Kellie Simmering DO   On: 03/14/2021 16:39   CT ABDOMEN PELVIS W CONTRAST  Result Date: 03/14/2021 CLINICAL DATA:  Intermittent abdominal pain with nausea and vomiting for 2 days. EXAM: CT ABDOMEN AND PELVIS WITH CONTRAST TECHNIQUE: Multidetector CT imaging of the abdomen and pelvis was performed using the standard protocol following bolus administration of intravenous contrast. CONTRAST:  156m OMNIPAQUE IOHEXOL 300 MG/ML  SOLN COMPARISON:  Overlapping portions of CT chest from 10/16/2019 FINDINGS: Lower chest: Unremarkable Hepatobiliary: Unremarkable Pancreas: Unremarkable Spleen: Unremarkable Adrenals/Urinary Tract: Unremarkable Stomach/Bowel: Unremarkable Vascular/Lymphatic: Mild aortoiliac atherosclerotic vascular disease. No adenopathy. Reproductive: Unremarkable Other: No supplemental non-categorized findings. Musculoskeletal: Unremarkable IMPRESSION: 1. A cause for the patient's abdominal pain is not identified. 2. Mild aortoiliac atherosclerotic vascular disease. Aortic Atherosclerosis (ICD10-I70.0). Electronically Signed   By: WVan ClinesM.D.   On: 03/14/2021 18:15   DG Chest Port 1 View  Result Date: 03/14/2021 CLINICAL DATA:  Dizziness with weakness diabetic EXAM: PORTABLE CHEST 1 VIEW COMPARISON:  Chest radiograph 11/19/2020 FINDINGS: Stable cardiomediastinal contours. The lungs are clear. No pneumothorax  or pleural effusion. No acute finding in the visualized skeleton. IMPRESSION: No acute cardiopulmonary process. Electronically Signed   By: NAudie PintoM.D.   On: 03/14/2021 16:42    Microbiology: Recent Results (from the past 240 hour(s))  Blood culture (routine single)     Status: None (Preliminary result)   Collection Time: 03/14/21  3:55 PM   Specimen: BLOOD LEFT FOREARM  Result Value Ref Range Status   Specimen Description   Final    BLOOD LEFT FOREARM BOTTLES DRAWN AEROBIC AND ANAEROBIC   Special Requests   Final    Blood Culture results may not be optimal due to an inadequate volume of blood received in culture bottles   Culture   Final    NO GROWTH < 24 HOURS Performed at [facility] [address], [place] [address]   Report Status PENDING  Incomplete  Blood Culture (routine x 2)     Status: None (Preliminary result)   Collection Time: 03/14/21  4:08 PM   Specimen: BLOOD RIGHT FOREARM  Result Value Ref Range Status   Specimen Description   Final    BLOOD RIGHT FOREARM BOTTLES DRAWN AEROBIC AND ANAEROBIC   Special Requests Blood Culture adequate volume  Final   Culture   Final    NO GROWTH < 24 HOURS Performed at [facility] [address] [place] [address]   Report  Status PENDING  Incomplete  Resp Panel by RT-PCR (Flu A&B, Covid) Nasopharyngeal Swab     Status: None   Collection Time: 03/14/21  5:08 PM   Specimen: Nasopharyngeal Swab; Nasopharyngeal(NP) swabs in vial transport medium  Result Value Ref Range Status   SARS Coronavirus 2 by RT PCR NEGATIVE NEGATIVE Final    Comment: (NOTE) SARS-CoV-2 target nucleic acids are NOT DETECTED.  The SARS-CoV-2 RNA is generally detectable in upper respiratory specimens during the acute phase of infection. The lowest concentration of SARS-CoV-2 viral copies this assay can detect is 138 copies/mL. A negative result does not preclude SARS-Cov-2 infection and should not be used as the sole basis for  treatment or other patient management decisions. A negative result may occur with  improper specimen collection/handling, submission of specimen other than nasopharyngeal swab, presence of viral mutation(s) within the areas targeted by this assay, and inadequate number of viral copies(<138 copies/mL). A negative result must be combined with clinical observations, patient history, and epidemiological information. The expected result is Negative.  Fact Sheet for Patients:  EntrepreneurPulse.com.au  Fact Sheet for Healthcare Providers:  IncredibleEmployment.be  This test is no t yet approved or cleared by the [place] FDA and  has been authorized for detection and/or diagnosis of SARS-CoV-2 by FDA under an Emergency Use Authorization (EUA). This EUA will remain  in effect (meaning this test can be used) for the duration of the COVID-19 declaration under Section 564(b)(1) of the Act, 21 U.S.C.section 360bbb-3(b)(1), unless the authorization is terminated  or revoked sooner.       Influenza A by PCR NEGATIVE NEGATIVE Final   Influenza B by PCR NEGATIVE NEGATIVE Final    Comment: (NOTE) The Xpert Xpress SARS-CoV-2/FLU/RSV plus assay is intended as an aid in the diagnosis of influenza from Nasopharyngeal swab specimens and should not be used as a sole basis for treatment. Nasal washings and aspirates are unacceptable for Xpert Xpress SARS-CoV-2/FLU/RSV testing.  Fact Sheet for Patients: EntrepreneurPulse.com.au  Fact Sheet for Healthcare Providers: IncredibleEmployment.be  This test is not yet approved or cleared by the [place] FDA and has been authorized for detection and/or diagnosis of SARS-CoV-2 by FDA under an Emergency Use Authorization (EUA). This EUA will remain in effect (meaning this test can be used) for the duration of the COVID-19 declaration under Section 564(b)(1) of the Act, 21  U.S.C. section 360bbb-3(b)(1), unless the authorization is terminated or revoked.  Performed at [facility], [address], [place], [place] [address]   Urine culture     Status: None (Preliminary result)   Collection Time: 03/14/21  5:28 PM   Specimen: In/Out Cath Urine  Result Value Ref Range Status   Specimen Description   Final    IN/OUT CATH URINE Performed at [facility], [address] [place], [place] [address]    Special Requests   Final    NONE Performed at [facility], [place] [address], [place], [place] [address]    Culture PENDING  Incomplete   Report Status PENDING  Incomplete     Labs: Basic Metabolic Panel: Recent Labs  Lab 03/14/21 1555 03/14/21 1717 03/15/21 0435  NA 136 139 139  K 3.3* 3.9 3.1*  CL 99 101 102  CO2 _0 GLUCOSE 254* 170* 111*  BUN 25* 23* 21*  CREATININE 1.28* 1.16 0.90  CALCIUM 9.8 9.4 9.0   Liver Function Tests: Recent Labs  Lab 03/14/21 1555  AST 16  ALT 18  ALKPHOS  87  BILITOT 0.6  PROT 7.9  ALBUMIN 4.2   Recent Labs  Lab 03/14/21 1555  LIPASE 21   Recent Labs  Lab 03/14/21 2107  AMMONIA 13   CBC: Recent Labs  Lab 03/14/21 1555 03/15/21 0435  WBC 15.9* 11.3*  NEUTROABS 13.3*  --   HGB 13.9 12.5*  HCT 40.8 37.0*  MCV 89.7 91.1  PLT 284 234   CBG: Recent Labs  Lab 03/14/21 2050 03/14/21 2358 03/15/21 0349 03/15/21 0724 03/15/21 1101  GLUCAP 163* 105* 104* 94 138*   Signed:  Barton Dubois MD.  [facility] 03/15/2021, 2:49 PM

## 2021-03-16 LAB — URINE CULTURE

## 2021-03-17 ENCOUNTER — Telehealth: Payer: Self-pay

## 2021-03-17 NOTE — Telephone Encounter (Signed)
Transition Care Management Unsuccessful Follow-up Telephone Call  Date of discharge and from where:  03/15/21 FROM [facility]   Attempts:  1st Attempt  Reason for unsuccessful TCM follow-up call:  Left voice message

## 2021-03-19 LAB — CULTURE, BLOOD (ROUTINE X 2)
Culture: NO GROWTH
Special Requests: ADEQUATE

## 2021-03-19 LAB — CULTURE, BLOOD (SINGLE): Culture: NO GROWTH

## 2021-04-18 ENCOUNTER — Emergency Department (HOSPITAL_COMMUNITY): Payer: Self-pay

## 2021-04-18 ENCOUNTER — Encounter (HOSPITAL_COMMUNITY): Payer: Self-pay

## 2021-04-18 ENCOUNTER — Other Ambulatory Visit: Payer: Self-pay

## 2021-04-18 ENCOUNTER — Emergency Department (HOSPITAL_COMMUNITY)
Admission: EM | Admit: 2021-04-18 | Discharge: 2021-04-18 | Disposition: A | Discharge status: Home / Self Care | Payer: Self-pay | Attending: Emergency Medicine | Admitting: Emergency Medicine

## 2021-04-18 DIAGNOSIS — R112 Nausea with vomiting, unspecified: Secondary | ICD-10-CM | POA: Insufficient documentation

## 2021-04-18 DIAGNOSIS — D72829 Elevated white blood cell count, unspecified: Secondary | ICD-10-CM | POA: Insufficient documentation

## 2021-04-18 DIAGNOSIS — E876 Hypokalemia: Secondary | ICD-10-CM | POA: Insufficient documentation

## 2021-04-18 DIAGNOSIS — R630 Anorexia: Secondary | ICD-10-CM | POA: Insufficient documentation

## 2021-04-18 DIAGNOSIS — R531 Weakness: Secondary | ICD-10-CM | POA: Insufficient documentation

## 2021-04-18 DIAGNOSIS — Z79899 Other long term (current) drug therapy: Secondary | ICD-10-CM | POA: Insufficient documentation

## 2021-04-18 DIAGNOSIS — R1084 Generalized abdominal pain: Secondary | ICD-10-CM | POA: Insufficient documentation

## 2021-04-18 DIAGNOSIS — Z794 Long term (current) use of insulin: Secondary | ICD-10-CM | POA: Insufficient documentation

## 2021-04-18 DIAGNOSIS — E860 Dehydration: Secondary | ICD-10-CM | POA: Insufficient documentation

## 2021-04-18 DIAGNOSIS — R000 Tachycardia, unspecified: Secondary | ICD-10-CM | POA: Insufficient documentation

## 2021-04-18 DIAGNOSIS — F17210 Nicotine dependence, cigarettes, uncomplicated: Secondary | ICD-10-CM | POA: Insufficient documentation

## 2021-04-18 DIAGNOSIS — I10 Essential (primary) hypertension: Secondary | ICD-10-CM | POA: Insufficient documentation

## 2021-04-18 DIAGNOSIS — E1140 Type 2 diabetes mellitus with diabetic neuropathy, unspecified: Secondary | ICD-10-CM | POA: Insufficient documentation

## 2021-04-18 LAB — CBC
HCT: 39.1 % (ref 39.0–52.0)
Hemoglobin: 13.4 g/dL (ref 13.0–17.0)
MCH: 30.5 pg (ref 26.0–34.0)
MCHC: 34.3 g/dL (ref 30.0–36.0)
MCV: 89.1 fL (ref 80.0–100.0)
Platelets: 280 10*3/uL (ref 150–400)
RBC: 4.39 MIL/uL (ref 4.22–5.81)
RDW: 12.3 % (ref 11.5–15.5)
WBC: 13.2 10*3/uL — ABNORMAL HIGH (ref 4.0–10.5)
nRBC: 0 % (ref 0.0–0.2)

## 2021-04-18 LAB — COMPREHENSIVE METABOLIC PANEL
ALT: 13 U/L (ref 0–44)
AST: 19 U/L (ref 15–41)
Albumin: 4.1 g/dL (ref 3.5–5.0)
Alkaline Phosphatase: 82 U/L (ref 38–126)
Anion gap: 12 (ref 5–15)
BUN: 24 mg/dL — ABNORMAL HIGH (ref 6–20)
CO2: 24 mmol/L (ref 22–32)
Calcium: 9.6 mg/dL (ref 8.9–10.3)
Chloride: 100 mmol/L (ref 98–111)
Creatinine, Ser: 1.1 mg/dL (ref 0.61–1.24)
GFR, Estimated: >60 mL/min (ref >60)
Glucose, Bld: 184 mg/dL — ABNORMAL HIGH (ref 70–99)
Potassium: 3.3 mmol/L — ABNORMAL LOW (ref 3.5–5.1)
Sodium: 136 mmol/L (ref 135–145)
Total Bilirubin: 0.6 mg/dL (ref 0.3–1.2)
Total Protein: 7.6 g/dL (ref 6.5–8.1)

## 2021-04-18 LAB — URINALYSIS, COMPLETE (UACMP) WITH MICROSCOPIC
Bacteria, UA: NONE SEEN
Bilirubin Urine: NEGATIVE
Glucose, UA: 150 mg/dL — AB
Ketones, ur: 5 mg/dL — AB
Leukocytes,Ua: NEGATIVE
Nitrite: NEGATIVE
Protein, ur: 100 mg/dL — AB
Specific Gravity, Urine: >1.046 — ABNORMAL HIGH (ref 1.005–1.030)
pH: 6 (ref 5.0–8.0)

## 2021-04-18 LAB — BLOOD GAS, VENOUS
Acid-Base Excess: 6.8 mmol/L — ABNORMAL HIGH (ref 0.0–2.0)
Bicarbonate: 28.9 mmol/L — ABNORMAL HIGH (ref 20.0–28.0)
FIO2: 21
O2 Saturation: 34.5 %
Patient temperature: 37
pCO2, Ven: 43.6 mmHg — ABNORMAL LOW (ref 44.0–60.0)
pH, Ven: 7.463 — ABNORMAL HIGH (ref 7.250–7.430)
pO2, Ven: <31 mmHg — CL (ref 32.0–45.0)

## 2021-04-18 LAB — CBG MONITORING, ED: Glucose-Capillary: 168 mg/dL — ABNORMAL HIGH (ref 70–99)

## 2021-04-18 LAB — LACTIC ACID, PLASMA: Lactic Acid, Venous: 1.9 mmol/L (ref 0.5–1.9)

## 2021-04-18 LAB — BETA-HYDROXYBUTYRIC ACID: Beta-Hydroxybutyric Acid: 0.23 mmol/L (ref 0.05–0.27)

## 2021-04-18 LAB — LIPASE, BLOOD: Lipase: 21 U/L (ref 11–51)

## 2021-04-18 MED ORDER — ONDANSETRON HCL 4 MG/2ML IJ SOLN
4.0000 mg | Freq: Once | INTRAMUSCULAR | Status: AC
  Administered 2021-04-18: 4 mg via INTRAVENOUS
  Filled 2021-04-18: qty 2

## 2021-04-18 MED ORDER — IOHEXOL 300 MG/ML  SOLN
100.0000 mL | Freq: Once | INTRAMUSCULAR | Status: AC | PRN
  Administered 2021-04-18: 100 mL via INTRAVENOUS

## 2021-04-18 MED ORDER — LACTATED RINGERS IV BOLUS
1000.0000 mL | INTRAVENOUS | Status: AC
  Administered 2021-04-18 (×2): 1000 mL via INTRAVENOUS

## 2021-04-18 MED ORDER — ONDANSETRON HCL 4 MG PO TABS: 4.0000 mg | ORAL_TABLET | Freq: Four times a day (QID) | ORAL | 0 refills | Status: DC

## 2021-04-18 NOTE — ED Triage Notes (Signed)
Unable to tolerate fluids since Thursday, n/v no d., 6 episodes of vomiting. Central sharp abd pain. States decrease in urine output. T1DM, takes insulin daily. Pt lethargic in triage.

## 2021-04-18 NOTE — Discharge Instructions (Addendum)
Frequent small sips of clear fluids.  You have been prescribed ondansetron which will help with your nausea and vomiting.  Continue your diabetes medication as directed.  Please call your primary care provider tomorrow to arrange follow-up appointment.  Return to the emergency department for any new or worsening symptoms.

## 2021-04-18 NOTE — ED Provider Notes (Signed)
[facility] EMERGENCY DEPARTMENT Provider Note   CSN: [id number] Arrival date & time: 04/18/21  1240     History Chief Complaint  Patient presents with  . Abdominal Pain    Zachary Winters is a 40 y.o. male.  HPI     Zachary Winters is a 40 y.o. male with history of type 2 diabetes, HTN, who presents to the Emergency Department complaining of  nausea and vomiting x 3 days.  unable to tolerate fluids. He is having sharps pain to the mid abdomen as well and decreased urine out put.  Appetite decreased, but states he is continuing to take his insulin.  Symptoms associated with generalized weakness.  States his blood sugars have been up and down recently.  He denies fever, chills, chest pain and shortness of breath. No diarrhea. Admitted here end of March for lactic acidosis.    Past Medical History:  Diagnosis Date  . Diabetes mellitus without complication [facility])     Patient Active Problem List   Diagnosis Date Noted  . Dehydration   . Lactic acidosis 03/14/2021  . SIRS (systemic inflammatory response syndrome) ([place]) 03/14/2021  . Abdominal pain with vomiting 03/14/2021  . Somnolence 03/14/2021  . Intractable nausea and vomiting   . Type 2 diabetes mellitus with diabetic neuropathy ([place]) 01/01/2020  . Hyperlipidemia associated with type 2 diabetes mellitus ([place]) 01/01/2020  . Hypertension associated with diabetes ([place]) 10/31/2019  . Essential hypertension 10/31/2019    History reviewed. No pertinent surgical history.     History reviewed. No pertinent family history.  Social History   Tobacco Use  . Smoking status: Current Every Day Smoker    Packs/day: 0.25    Years: 20.00    Pack years: 5.00    Types: Cigarettes  . Smokeless tobacco: Never Used  Vaping Use  . Vaping Use: Never used  Substance Use Topics  . Alcohol use: Yes  . Drug use: Yes    Frequency: 2.0 times per week    Types: Marijuana    Home Medications Prior to Admission medications    Medication Sig Start Date End Date Taking? Authorizing Provider  atorvastatin (LIPITOR) 10 MG tablet Take 1 tablet (10 mg total) by mouth daily. 01/01/20   Perlie Mayo, NP  blood glucose meter kit and supplies Dispense based on patient and insurance preference. Check blood sugar four times daily as directed. (FOR ICD-10 E10.9, E11.9). 01/01/20   Perlie Mayo, NP  cyclobenzaprine (FLEXERIL) 10 MG tablet Take 1 tablet (10 mg total) by mouth 2 (two) times daily as needed for muscle spasms. 10/16/19   Gareth Morgan, MD  gabapentin (NEURONTIN) 100 MG capsule Take 1 capsule (100 mg total) by mouth at bedtime. 01/01/20   Perlie Mayo, NP  HUMULIN 70/30 (70-30) 100 UNIT/ML injection Inject 18 Units into the skin 2 (two) times daily with a meal. 03/15/21   Barton Dubois, MD  Insulin Syringe-Needle U-100 [facility] INSULIN SYRINGES 31GX5/16") 31G X 5/16" 0.3 ML MISC Use as instructed to inject insulin twice a day. 03/15/21   Barton Dubois, MD  lisinopril (ZESTRIL) 10 MG tablet Take 1 tablet (10 mg total) by mouth daily. 03/15/21   Barton Dubois, MD    Allergies    Metformin and Penicillins  Review of Systems   Review of Systems  Constitutional: Positive for activity change, appetite change and fatigue. Negative for chills and fever.  HENT: Negative for trouble swallowing.   Respiratory: Negative for cough and shortness of  breath.   Cardiovascular: Negative for chest pain and palpitations.  Gastrointestinal: Positive for abdominal pain, nausea and vomiting. Negative for blood in stool and diarrhea.  Genitourinary: Positive for decreased urine volume. Negative for dysuria, flank pain and hematuria.  Musculoskeletal: Negative for arthralgias, back pain, myalgias, neck pain and neck stiffness.  Skin: Negative for rash.  Neurological: Negative for dizziness, seizures, syncope, weakness (generalized weakness. ), numbness and headaches.  Hematological: Does not bruise/bleed easily.   Psychiatric/Behavioral: Negative for confusion.    Physical Exam Updated Vital Signs BP (!) 145/98   Pulse (!) 101   Temp 98.3 F (36.8 C) (Oral)   Resp 16   Ht 6' (1.829 m)   Wt 66.7 kg   SpO2 100%   BMI 19.94 kg/m   Physical Exam Vitals and nursing note reviewed.  Constitutional:      Appearance: Normal appearance. He is ill-appearing. He is not toxic-appearing.     Comments: Pt appears somewhat cachetic.    HENT:     Head: Normocephalic.     Mouth/Throat:     Mouth: Mucous membranes are dry.  Eyes:     Conjunctiva/sclera: Conjunctivae normal.     Pupils: Pupils are equal, round, and reactive to light.  Neck:     Thyroid: No thyromegaly.     Meningeal: Kernig's sign absent.  Cardiovascular:     Rate and Rhythm: Regular rhythm. Tachycardia present.     Pulses: Normal pulses.  Pulmonary:     Effort: Pulmonary effort is normal.     Breath sounds: Normal breath sounds. No wheezing.  Abdominal:     Palpations: Abdomen is soft.     Tenderness: There is abdominal tenderness. There is no guarding or rebound.     Comments: Diffuse mid abdominal tenderness to palpation.  abd soft, no guarding or rebound tenderness.   Musculoskeletal:        General: Normal range of motion.     Cervical back: Normal range of motion and neck supple.  Skin:    General: Skin is warm.     Findings: No rash.  Neurological:     General: No focal deficit present.     Mental Status: He is alert and oriented to person, place, and time.     Sensory: No sensory deficit.     Motor: No weakness.  Psychiatric:        Mood and Affect: Mood normal.     ED Results / Procedures / Treatments   Labs (all labs ordered are listed, but only abnormal results are displayed) Labs Reviewed  COMPREHENSIVE METABOLIC PANEL - Abnormal; Notable for the following components:      Result Value   Potassium 3.3 (*)    Glucose, Bld 184 (*)    BUN 24 (*)    All other components within normal limits  CBC -  Abnormal; Notable for the following components:   WBC 13.2 (*)    All other components within normal limits  BLOOD GAS, VENOUS - Abnormal; Notable for the following components:   pH, Ven 7.463 (*)    pCO2, Ven 43.6 (*)    pO2, Ven <31.0 (*)    Bicarbonate 28.9 (*)    Acid-Base Excess 6.8 (*)    All other components within normal limits  URINALYSIS, COMPLETE (UACMP) WITH MICROSCOPIC - Abnormal; Notable for the following components:   Specific Gravity, Urine >1.046 (*)    Glucose, UA 150 (*)    Hgb urine dipstick SMALL (*)  Ketones, ur 5 (*)    Protein, ur 100 (*)    All other components within normal limits  CBG MONITORING, ED - Abnormal; Notable for the following components:   Glucose-Capillary 168 (*)    All other components within normal limits  LIPASE, BLOOD  BETA-HYDROXYBUTYRIC ACID  LACTIC ACID, PLASMA    EKG EKG Interpretation  Date/Time:  Sunday April 18 2021 14:02:25 EDT Ventricular Rate:  102 PR Interval:    QRS Duration: 78 QT Interval:  328 QTC Calculation: 428 R Axis:   92 Text Interpretation: Sinus tachycardia Borderline right axis deviation Artifact Confirmed by Fredia Sorrow [phone]) on 04/18/2021 2:06:45 PM   Radiology CT ABDOMEN PELVIS W CONTRAST  Result Date: 04/18/2021 CLINICAL DATA:  Nausea and vomiting since Thursday. EXAM: CT ABDOMEN AND PELVIS WITH CONTRAST TECHNIQUE: Multidetector CT imaging of the abdomen and pelvis was performed using the standard protocol following bolus administration of intravenous contrast. CONTRAST:  158m OMNIPAQUE IOHEXOL 300 MG/ML  SOLN COMPARISON:  03/14/2021 FINDINGS: Lower chest: The lung bases are clear of acute process. No pleural effusion or pulmonary lesions. The heart is normal in size. No pericardial effusion. The distal esophagus and aorta are unremarkable. Hepatobiliary: No focal hepatic lesions or intrahepatic biliary dilatation. The gallbladder is normal. No common bile duct dilatation. Pancreas: No mass,  inflammation or ductal dilatation. Spleen: Normal size.  No focal lesions are Adrenals/Urinary Tract: Adrenal glands and kidneys are unremarkable. No renal lesions or hydronephrosis. The bladder is unremarkable. Stomach/Bowel: The stomach, duodenum, small bowel and colon are grossly normal without oral contrast. No inflammatory changes, mass lesions or obstructive findings. The appendix is normal. Vascular/Lymphatic: Age advanced atherosclerotic calcifications involving the distal aorta and iliac arteries. No aneurysm or dissection. The major venous structures are patent. No mesenteric or retroperitoneal mass or adenopathy. Reproductive: The prostate gland and seminal vesicles are unremarkable. Other: No pelvic mass or adenopathy. No free pelvic fluid collections. No inguinal mass or adenopathy. No abdominal wall hernia or subcutaneous lesions. Musculoskeletal: No significant bony findings. IMPRESSION: 1. No acute abdominal/pelvic findings, mass lesions or adenopathy. 2. Age advanced atherosclerotic calcifications involving the distal aorta and iliac arteries. Aortic Atherosclerosis (ICD10-I70.0). Electronically Signed   By: PMarijo SanesM.D.   On: 04/18/2021 15:14   DG Chest Portable 1 View  Result Date: 04/18/2021 CLINICAL DATA:  Weakness. EXAM: PORTABLE CHEST 1 VIEW COMPARISON:  03/14/2021 FINDINGS: The heart size and mediastinal contours are within normal limits. Both lungs are clear. The visualized skeletal structures are unremarkable. IMPRESSION: No active disease. Electronically Signed   By: TKerby MoorsM.D.   On: 04/18/2021 14:33    Procedures Procedures   Medications Ordered in ED Medications  lactated ringers bolus 1,000 mL (0 mLs Intravenous Stopped 04/18/21 1608)  ondansetron (ZOFRAN) injection 4 mg (4 mg Intravenous Given 04/18/21 1354)  iohexol (OMNIPAQUE) 300 MG/ML solution 100 mL (100 mLs Intravenous Contrast Given 04/18/21 1500)    ED Course  I have reviewed the triage vital signs  and the nursing notes.  Pertinent labs & imaging results that were available during my care of the patient were reviewed by me and considered in my medical decision making (see chart for details).    MDM Rules/Calculators/A&P                          Patient with type 2 diabetes.  Here with persistent nausea vomiting and diffuse abdominal pain.  Symptoms have been present for 3  days.  Unable to tolerate liquids or solid food.  No hematochezia, melena or tarry stool.  On exam, patient appears clinically dehydrated, frail for stated age.  Nontoxic-appearing.  Possibly acidotic.  Will obtain labs, IV fluids, CT abdomen pelvis  CBG 168.  Lactic acid reassuring.  BHA unremarkable and blood gas shows a pH of 7.46.  No renal insufficiency and bicarb within normal limits.  Mild leukocytosis.  Mild hypokalemia likely secondary to his persistent vomiting.  Receiving IV fluids.  No vomiting during ER stay.  No clinical evidence for acidosis, no AKI  CT abdomen and pelvis without acute findings, chest x-ray clear.  EKG without acute ischemic change.  Discussed findings with Dr. Rogene Houston  On recheck, patient has received IV fluids and states that he is feeling much better.  Abdomen remains soft and now nontender.  Patient tolerating oral fluids well.  I feel that he is appropriate for discharge home, I have discussed importance of close follow-up with his PCP and also recommended that he discuss possible referral from PCP to nutritionist   The patient appears reasonably screened and/or stabilized for discharge and I doubt any other medical condition or other [facility] requiring further screening, evaluation, or treatment in the ED at this time prior to discharge.  Final Clinical Impression(s) / ED Diagnoses Final diagnoses:  Non-intractable vomiting with nausea, unspecified vomiting type  Dehydration    Rx / DC Orders ED Discharge Orders    None       Kem Parkinson, PA-C 04/19/21 2257    Fredia Sorrow, MD 04/22/21 1512

## 2021-04-19 ENCOUNTER — Emergency Department (HOSPITAL_COMMUNITY)
Admission: EM | Admit: 2021-04-19 | Discharge: 2021-04-19 | Discharge status: Against Medical Advice | Payer: Medicaid Other

## 2021-04-19 NOTE — ED Triage Notes (Signed)
Patient was not in waiting area when called

## 2021-04-20 ENCOUNTER — Other Ambulatory Visit: Payer: Self-pay

## 2021-04-20 ENCOUNTER — Emergency Department (HOSPITAL_COMMUNITY)
Admission: EM | Admit: 2021-04-20 | Discharge: 2021-04-20 | Disposition: A | Discharge status: Home / Self Care | Payer: Medicaid Other | Attending: Emergency Medicine | Admitting: Emergency Medicine

## 2021-04-20 ENCOUNTER — Encounter (HOSPITAL_COMMUNITY): Payer: Self-pay

## 2021-04-20 ENCOUNTER — Ambulatory Visit: Payer: Medicaid Other | Admitting: Nurse Practitioner

## 2021-04-20 DIAGNOSIS — R0682 Tachypnea, not elsewhere classified: Secondary | ICD-10-CM | POA: Insufficient documentation

## 2021-04-20 DIAGNOSIS — Z794 Long term (current) use of insulin: Secondary | ICD-10-CM | POA: Insufficient documentation

## 2021-04-20 DIAGNOSIS — I10 Essential (primary) hypertension: Secondary | ICD-10-CM | POA: Insufficient documentation

## 2021-04-20 DIAGNOSIS — R000 Tachycardia, unspecified: Secondary | ICD-10-CM | POA: Insufficient documentation

## 2021-04-20 DIAGNOSIS — R112 Nausea with vomiting, unspecified: Secondary | ICD-10-CM | POA: Insufficient documentation

## 2021-04-20 DIAGNOSIS — R1084 Generalized abdominal pain: Secondary | ICD-10-CM | POA: Insufficient documentation

## 2021-04-20 DIAGNOSIS — E1140 Type 2 diabetes mellitus with diabetic neuropathy, unspecified: Secondary | ICD-10-CM | POA: Insufficient documentation

## 2021-04-20 DIAGNOSIS — Z79899 Other long term (current) drug therapy: Secondary | ICD-10-CM | POA: Insufficient documentation

## 2021-04-20 DIAGNOSIS — F17210 Nicotine dependence, cigarettes, uncomplicated: Secondary | ICD-10-CM | POA: Insufficient documentation

## 2021-04-20 LAB — BASIC METABOLIC PANEL
Anion gap: 14 (ref 5–15)
Anion gap: 9 (ref 5–15)
BUN: 19 mg/dL (ref 6–20)
BUN: 16 mg/dL (ref 6–20)
CO2: 25 mmol/L (ref 22–32)
CO2: 27 mmol/L (ref 22–32)
Calcium: 9.8 mg/dL (ref 8.9–10.3)
Calcium: 8.7 mg/dL — ABNORMAL LOW (ref 8.9–10.3)
Chloride: 97 mmol/L — ABNORMAL LOW (ref 98–111)
Chloride: 98 mmol/L (ref 98–111)
Creatinine, Ser: 1.16 mg/dL (ref 0.61–1.24)
Creatinine, Ser: 0.99 mg/dL (ref 0.61–1.24)
GFR, Estimated: >60 mL/min (ref >60)
GFR, Estimated: >60 mL/min (ref >60)
Glucose, Bld: 273 mg/dL — ABNORMAL HIGH (ref 70–99)
Glucose, Bld: 197 mg/dL — ABNORMAL HIGH (ref 70–99)
Potassium: 3.6 mmol/L (ref 3.5–5.1)
Potassium: 3.2 mmol/L — ABNORMAL LOW (ref 3.5–5.1)
Sodium: 136 mmol/L (ref 135–145)
Sodium: 134 mmol/L — ABNORMAL LOW (ref 135–145)

## 2021-04-20 LAB — URINALYSIS, ROUTINE W REFLEX MICROSCOPIC
Bacteria, UA: NONE SEEN
Bilirubin Urine: NEGATIVE
Glucose, UA: >=500 mg/dL — AB
Ketones, ur: 20 mg/dL — AB
Leukocytes,Ua: NEGATIVE
Nitrite: NEGATIVE
Protein, ur: 30 mg/dL — AB
Specific Gravity, Urine: 1.008 (ref 1.005–1.030)
pH: 9 — ABNORMAL HIGH (ref 5.0–8.0)

## 2021-04-20 LAB — CBC WITH DIFFERENTIAL/PLATELET
Abs Immature Granulocytes: 0.02 10*3/uL (ref 0.00–0.07)
Basophils Absolute: 0 10*3/uL (ref 0.0–0.1)
Basophils Relative: 1 %
Eosinophils Absolute: 0 10*3/uL (ref 0.0–0.5)
Eosinophils Relative: 1 %
HCT: 41.8 % (ref 39.0–52.0)
Hemoglobin: 14.4 g/dL (ref 13.0–17.0)
Immature Granulocytes: 0 %
Lymphocytes Relative: 19 %
Lymphs Abs: 1.6 10*3/uL (ref 0.7–4.0)
MCH: 30.5 pg (ref 26.0–34.0)
MCHC: 34.4 g/dL (ref 30.0–36.0)
MCV: 88.6 fL (ref 80.0–100.0)
Monocytes Absolute: 0.5 10*3/uL (ref 0.1–1.0)
Monocytes Relative: 6 %
Neutro Abs: 6.3 10*3/uL (ref 1.7–7.7)
Neutrophils Relative %: 73 %
Platelets: 252 10*3/uL (ref 150–400)
RBC: 4.72 MIL/uL (ref 4.22–5.81)
RDW: 12.3 % (ref 11.5–15.5)
WBC: 8.5 10*3/uL (ref 4.0–10.5)
nRBC: 0 % (ref 0.0–0.2)

## 2021-04-20 LAB — BLOOD GAS, VENOUS
Acid-Base Excess: 3.9 mmol/L — ABNORMAL HIGH (ref 0.0–2.0)
Acid-Base Excess: 7.7 mmol/L — ABNORMAL HIGH (ref 0.0–2.0)
Bicarbonate: 27.8 mmol/L (ref 20.0–28.0)
Bicarbonate: 29.7 mmol/L — ABNORMAL HIGH (ref 20.0–28.0)
FIO2: 21
FIO2: 21
O2 Saturation: 38.5 %
O2 Saturation: 33.5 %
Patient temperature: 36.5
Patient temperature: 36.8
pCO2, Ven: 26.1 mmHg — ABNORMAL LOW (ref 44.0–60.0)
pCO2, Ven: 43.8 mmHg — ABNORMAL LOW (ref 44.0–60.0)
pH, Ven: 7.601 (ref 7.250–7.430)
pH, Ven: 7.472 — ABNORMAL HIGH (ref 7.250–7.430)
pO2, Ven: <31 mmHg — CL (ref 32.0–45.0)
pO2, Ven: >31 mmHg — CL (ref 32.0–45.0)

## 2021-04-20 LAB — LACTIC ACID, PLASMA
Lactic Acid, Venous: 4.8 mmol/L (ref 0.5–1.9)
Lactic Acid, Venous: 2.4 mmol/L (ref 0.5–1.9)

## 2021-04-20 LAB — CBG MONITORING, ED
Glucose-Capillary: 259 mg/dL — ABNORMAL HIGH (ref 70–99)
Glucose-Capillary: 265 mg/dL — ABNORMAL HIGH (ref 70–99)
Glucose-Capillary: 216 mg/dL — ABNORMAL HIGH (ref 70–99)

## 2021-04-20 LAB — BETA-HYDROXYBUTYRIC ACID: Beta-Hydroxybutyric Acid: 1.54 mmol/L — ABNORMAL HIGH (ref 0.05–0.27)

## 2021-04-20 MED ORDER — MORPHINE SULFATE (PF) 4 MG/ML IV SOLN
4.0000 mg | Freq: Once | INTRAVENOUS | Status: AC
  Administered 2021-04-20: 4 mg via INTRAVENOUS
  Filled 2021-04-20: qty 1

## 2021-04-20 MED ORDER — ONDANSETRON HCL 4 MG/2ML IJ SOLN
4.0000 mg | Freq: Once | INTRAMUSCULAR | Status: AC
  Administered 2021-04-20: 4 mg via INTRAVENOUS
  Filled 2021-04-20: qty 2

## 2021-04-20 MED ORDER — LACTATED RINGERS IV BOLUS
1000.0000 mL | INTRAVENOUS | Status: AC
  Administered 2021-04-20 (×2): 1000 mL via INTRAVENOUS

## 2021-04-20 NOTE — ED Notes (Signed)
Patient ambulatory and denies pain or voices no complaints at this time

## 2021-04-20 NOTE — ED Notes (Signed)
[id number]-[id number] 

## 2021-04-20 NOTE — Discharge Instructions (Signed)
     Your exam/testing today was overall reassuring.  Symptoms seem to be due to intractable nausea vomiting, this could be from gastroparesis or hyperemesis cannabis syndrome as we discussed.  Nausea follow-up with the GI doctor, their information is above.  Please schedule appointment with them.  I also want you to have close follow-up with your primary care doctor, please schedule appoint with them in the next couple of days.  Make sure you stay hydrated, if you continue to have continuous vomiting then please come back to the emergency department.  Make sure to take your insulin when you get home.  Your blood pressure was also elevated here in the emergency department, please follow-up with your PCP about this as well.  Please return to the Emergency Department if you experience any worsening of your condition.  Thank you for allowing [place] to be a part of your care. Please speak to your pharmacist about any new medications prescribed today in regards to side effects or interactions with other medications.

## 2021-04-20 NOTE — ED Triage Notes (Signed)
Pt presents to ED with complaints of generalized abdominal pain, vomiting, nausea since Friday. Pt Type 1 DM. Pt vomited "a lot" in last 24 hours, states unable to keep anything down.

## 2021-04-20 NOTE — ED Notes (Signed)
Patient states he has been able to keep down 2 cups of water and 2 packages of graham crackers.

## 2021-04-20 NOTE — ED Provider Notes (Signed)
[facility] Provider Note   CSN: [id number] Arrival date & time: 04/20/21  1150     History Chief Complaint  Patient presents with  . Abdominal Pain    Zachary Winters is a 40 y.o. male with pertinent past medical history of type 1 diabetes, recent admission last month for lactic acidosis, non-intractable nausea vomiting and that presents to the emergency department today for abdominal pain nausea vomiting.  Patient states that he has been having the symptoms since Friday.  Patient was seen in the emergency department 2 days ago for non-intractable nausea vomiting,.  States that since he was discharged he was unable to keep anything down, is unable to tolerate fluids.  Patient states that he did not take his insulin today, has been taking it every other day.  Has been feeling weak and having generalized abdominal pain.  States this feels similar to when he was seen in the emergency department in November for intractable nausea vomiting.  Patient does smoke marijuana daily, denies any other substance use.  Denies any hematemesis, fevers, diarrhea.  States that his appetite has been decreased in addition to his urine output and bowel movements.  HPI     Past Medical History:  Diagnosis Date  . Diabetes mellitus without complication [facility])     Patient Active Problem List   Diagnosis Date Noted  . Dehydration   . Lactic acidosis 03/14/2021  . SIRS (systemic inflammatory response syndrome) ([place]) 03/14/2021  . Abdominal pain with vomiting 03/14/2021  . Somnolence 03/14/2021  . Intractable nausea and vomiting   . Type 2 diabetes mellitus with diabetic neuropathy ([place]) 01/01/2020  . Hyperlipidemia associated with type 2 diabetes mellitus ([place]) 01/01/2020  . Hypertension associated with diabetes ([place]) 10/31/2019  . Essential hypertension 10/31/2019    History reviewed. No pertinent surgical history.     No family history on file.  Social History   Tobacco Use   . Smoking status: Current Every Day Smoker    Packs/day: 0.25    Years: 20.00    Pack years: 5.00    Types: Cigarettes  . Smokeless tobacco: Never Used  Vaping Use  . Vaping Use: Never used  Substance Use Topics  . Alcohol use: Yes  . Drug use: Yes    Frequency: 2.0 times per week    Types: Marijuana    Home Medications Prior to Admission medications   Medication Sig Start Date End Date Taking? Authorizing Provider  HUMULIN 70/30 (70-30) 100 UNIT/ML injection Inject 18 Units into the skin 2 (two) times daily with a meal. 03/15/21  Yes Barton Dubois, MD  lisinopril (ZESTRIL) 10 MG tablet Take 1 tablet (10 mg total) by mouth daily. 03/15/21  Yes Barton Dubois, MD  ondansetron (ZOFRAN) 4 MG tablet Take 1 tablet (4 mg total) by mouth every 6 (six) hours. 04/18/21  Yes Triplett, Tammy, PA-C  atorvastatin (LIPITOR) 10 MG tablet Take 1 tablet (10 mg total) by mouth daily. Patient not taking: Reported on 04/20/2021 01/01/20   Perlie Mayo, NP  blood glucose meter kit and supplies Dispense based on patient and insurance preference. Check blood sugar four times daily as directed. (FOR ICD-10 E10.9, E11.9). 01/01/20   Perlie Mayo, NP  cyclobenzaprine (FLEXERIL) 10 MG tablet Take 1 tablet (10 mg total) by mouth 2 (two) times daily as needed for muscle spasms. Patient not taking: Reported on 04/20/2021 10/16/19   Gareth Morgan, MD  gabapentin (NEURONTIN) 100 MG capsule Take 1 capsule (100  mg total) by mouth at bedtime. Patient not taking: Reported on 04/20/2021 01/01/20   Perlie Mayo, NP  Insulin Syringe-Needle U-100 [facility] INSULIN SYRINGES 31GX5/16") 31G X 5/16" 0.3 ML MISC Use as instructed to inject insulin twice a day. 03/15/21   Barton Dubois, MD    Allergies    Metformin and Penicillins  Review of Systems   Review of Systems  Constitutional: Negative for chills, diaphoresis, fatigue and fever.  HENT: Negative for congestion, sore throat and trouble swallowing.   Eyes: Negative for  pain and visual disturbance.  Respiratory: Negative for cough, shortness of breath and wheezing.   Cardiovascular: Negative for chest pain, palpitations and leg swelling.  Gastrointestinal: Positive for abdominal pain, nausea and vomiting. Negative for abdominal distention and diarrhea.  Genitourinary: Negative for difficulty urinating.  Musculoskeletal: Negative for back pain, neck pain and neck stiffness.  Skin: Negative for pallor.  Neurological: Negative for dizziness, speech difficulty, weakness and headaches.  Psychiatric/Behavioral: Negative for confusion.    Physical Exam Updated Vital Signs BP (!) 170/108   Pulse 92   Temp 97.8 F (36.6 C) (Oral)   Resp 13   Ht 6' (1.829 m)   Wt 67 kg   SpO2 97%   BMI 20.03 kg/m   Physical Exam Constitutional:      General: He is in acute distress.     Appearance: Normal appearance. He is diaphoretic. He is not ill-appearing or toxic-appearing.  HENT:     Mouth/Throat:     Mouth: Mucous membranes are moist.     Pharynx: Oropharynx is clear.  Eyes:     General: No scleral icterus.    Extraocular Movements: Extraocular movements intact.     Pupils: Pupils are equal, round, and reactive to light.  Cardiovascular:     Rate and Rhythm: Regular rhythm. Tachycardia present.     Pulses: Normal pulses.     Heart sounds: Normal heart sounds.  Pulmonary:     Effort: Pulmonary effort is normal. Tachypnea present. No respiratory distress.     Breath sounds: Normal breath sounds. No stridor. No wheezing, rhonchi or rales.  Chest:     Chest wall: No tenderness.  Abdominal:     General: Abdomen is flat. Bowel sounds are normal. There is no distension.     Palpations: Abdomen is soft.     Tenderness: There is generalized abdominal tenderness. There is no guarding or rebound.  Musculoskeletal:        General: No swelling or tenderness. Normal range of motion.     Cervical back: Normal range of motion and neck supple. No rigidity.     Right  lower leg: No edema.     Left lower leg: No edema.  Skin:    General: Skin is warm.     Capillary Refill: Capillary refill takes less than 2 seconds.     Coloration: Skin is not pale.  Neurological:     General: No focal deficit present.     Mental Status: He is alert and oriented to person, place, and time.  Psychiatric:        Mood and Affect: Mood normal.        Behavior: Behavior normal.     ED Results / Procedures / Treatments   Labs (all labs ordered are listed, but only abnormal results are displayed) Labs Reviewed  BASIC METABOLIC PANEL - Abnormal; Notable for the following components:      Result Value   Chloride 97 (*)  Glucose, Bld 273 (*)    All other components within normal limits  BASIC METABOLIC PANEL - Abnormal; Notable for the following components:   Sodium 134 (*)    Potassium 3.2 (*)    Glucose, Bld 197 (*)    Calcium 8.7 (*)    All other components within normal limits  BETA-HYDROXYBUTYRIC ACID - Abnormal; Notable for the following components:   Beta-Hydroxybutyric Acid 1.54 (*)    All other components within normal limits  BLOOD GAS, VENOUS - Abnormal; Notable for the following components:   pH, Ven 7.601 (*)    pCO2, Ven 26.1 (*)    Acid-Base Excess 3.9 (*)    All other components within normal limits  LACTIC ACID, PLASMA - Abnormal; Notable for the following components:   Lactic Acid, Venous 4.8 (*)    All other components within normal limits  LACTIC ACID, PLASMA - Abnormal; Notable for the following components:   Lactic Acid, Venous 2.4 (*)    All other components within normal limits  BLOOD GAS, VENOUS - Abnormal; Notable for the following components:   pH, Ven 7.472 (*)    pCO2, Ven 43.8 (*)    pO2, Ven >31.0 (*)    Bicarbonate 29.7 (*)    Acid-Base Excess 7.7 (*)    All other components within normal limits  CBG MONITORING, ED - Abnormal; Notable for the following components:   Glucose-Capillary 265 (*)    All other components within  normal limits  CBG MONITORING, ED - Abnormal; Notable for the following components:   Glucose-Capillary 216 (*)    All other components within normal limits  CBG MONITORING, ED - Abnormal; Notable for the following components:   Glucose-Capillary 259 (*)    All other components within normal limits  CBC WITH DIFFERENTIAL/PLATELET  URINALYSIS, ROUTINE W REFLEX MICROSCOPIC    EKG EKG Interpretation  Date/Time:  Tuesday April 20 2021 12:22:38 EDT Ventricular Rate:  113 PR Interval:  104 QRS Duration: 79 QT Interval:  327 QTC Calculation: 449 R Axis:   82 Text Interpretation: Sinus tachycardia Minimal ST depression, inferior leads Artifact in lead(s) I II III aVR aVL aVF V1 V2 V3 V4 V5 V6 No significant change since last tracing Confirmed by Isla Pence [phone]) on 04/20/2021 12:45:17 PM   Radiology No results found.  Procedures Procedures   Medications Ordered in ED Medications  lactated ringers bolus 1,000 mL (0 mLs Intravenous Stopped 04/20/21 1346)  morphine 4 MG/ML injection 4 mg (4 mg Intravenous Given 04/20/21 1231)  ondansetron (ZOFRAN) injection 4 mg (4 mg Intravenous Given 04/20/21 1230)    ED Course  I have reviewed the triage vital signs and the nursing notes.  Pertinent labs & imaging results that were available during my care of the patient were reviewed by me and considered in my medical decision making (see chart for details).    MDM Rules/Calculators/A&P                         Zachary Winters is a 40 y.o. male with pertinent past medical history of type 1 diabetes, recent admission last month for lactic acidosis, non-intractable nausea vomiting and that presents to the emergency department today for abdominal pain nausea vomiting.  Patient appears in acute distress, is tachypneic and tachycardic, CBG 258.  No respiratory distress, satting 100% on room air.  Will give morphine Zofran and lactated Ringer's and reevaluate, concern for DKA, gastroparesis or  hyperemesis cannabis  syndrome..  Patient did have unremarkable CT abdomen pelvis done 2 days ago in addition to no active disease on chest x-ray.  Patient 7.6, does not fit clinical picture of metabolic acidosis.  Most likely from hyperventilation.  Lactic acid 4.8.  Will repeat in an hour.  Upon reevaluation, patient is calm and sleeping after morphine and Zofran and fluids given.  pH 7.472, lactic acid down to 2.4, patient is eating and drinking normally.  Appears much better.  Do not think lactic acid is elevated from sepsis/hypoperfusion, patient appears well now and I think this is most likely from dehydration.  Passed p.o. challenge with graham crackers, patient appears much better, states that he feels much better, vital signs stabilized.  Is walking around the room, is ready to leave.  Has not required another dose of Zofran.  Think patient symptoms most likely due to hyperemesis cannabis syndrome or gastroparesis with dehydration.  Has never seen GI doctor.  Glucose 216.  No anion gap.  Patient appears much better than when he came in, tachycardia and tachypnea have resolved.  No longer having abdominal pain, upon repeat evaluation abdomen soft and nontender.  Shared decision making about patient being admitted since this is his second visit for intractable nausea vomiting with small ketones in urine with slightly elevated beta hydroxybutyric acid, patient is not in DKA with no anion gap.  Patient states that he does not want to be admitted at this time and follow-up with PCP.  Also refer to GI doctor at this time.  Patient be discharged, strict return precautions and close PCP follow-up recommended.  Doubt need for further emergent work up at this time. I explained the diagnosis and have given explicit precautions to return to the ER including for any other new or worsening symptoms. The patient understands and accepts the medical plan as it's been dictated and I have answered their questions.  Discharge instructions concerning home care and prescriptions have been given. The patient is STABLE and is discharged to home in good condition.  I discussed this case with my attending physician who cosigned this note including patient's presenting symptoms, physical exam, and planned diagnostics and interventions. Attending physician stated agreement with plan or made changes to plan which were implemented.     Final Clinical Impression(s) / ED Diagnoses Final diagnoses:  Intractable nausea and vomiting    Rx / DC Orders ED Discharge Orders    None       Alfredia Client, PA-C 04/20/21 1743    Isla Pence, MD 04/22/21 1530

## 2021-04-28 ENCOUNTER — Ambulatory Visit: Payer: Self-pay | Admitting: Nurse Practitioner

## 2021-05-20 ENCOUNTER — Emergency Department (HOSPITAL_COMMUNITY)
Admission: EM | Admit: 2021-05-20 | Discharge: 2021-05-20 | Disposition: A | Discharge status: Home / Self Care | Payer: Medicaid Other | Attending: Emergency Medicine | Admitting: Emergency Medicine

## 2021-05-20 ENCOUNTER — Other Ambulatory Visit: Payer: Self-pay

## 2021-05-20 ENCOUNTER — Encounter (HOSPITAL_COMMUNITY): Payer: Self-pay | Admitting: Emergency Medicine

## 2021-05-20 DIAGNOSIS — E1140 Type 2 diabetes mellitus with diabetic neuropathy, unspecified: Secondary | ICD-10-CM | POA: Insufficient documentation

## 2021-05-20 DIAGNOSIS — R000 Tachycardia, unspecified: Secondary | ICD-10-CM | POA: Insufficient documentation

## 2021-05-20 DIAGNOSIS — R112 Nausea with vomiting, unspecified: Secondary | ICD-10-CM

## 2021-05-20 DIAGNOSIS — I10 Essential (primary) hypertension: Secondary | ICD-10-CM | POA: Insufficient documentation

## 2021-05-20 DIAGNOSIS — Z79899 Other long term (current) drug therapy: Secondary | ICD-10-CM | POA: Insufficient documentation

## 2021-05-20 DIAGNOSIS — Z794 Long term (current) use of insulin: Secondary | ICD-10-CM | POA: Insufficient documentation

## 2021-05-20 DIAGNOSIS — R1013 Epigastric pain: Secondary | ICD-10-CM | POA: Insufficient documentation

## 2021-05-20 DIAGNOSIS — F17210 Nicotine dependence, cigarettes, uncomplicated: Secondary | ICD-10-CM | POA: Insufficient documentation

## 2021-05-20 DIAGNOSIS — E876 Hypokalemia: Secondary | ICD-10-CM | POA: Insufficient documentation

## 2021-05-20 DIAGNOSIS — E1169 Type 2 diabetes mellitus with other specified complication: Secondary | ICD-10-CM | POA: Insufficient documentation

## 2021-05-20 DIAGNOSIS — E785 Hyperlipidemia, unspecified: Secondary | ICD-10-CM | POA: Insufficient documentation

## 2021-05-20 LAB — CBC WITH DIFFERENTIAL/PLATELET
Abs Immature Granulocytes: 0.04 10*3/uL (ref 0.00–0.07)
Basophils Absolute: 0 10*3/uL (ref 0.0–0.1)
Basophils Relative: 0 %
Eosinophils Absolute: 0 10*3/uL (ref 0.0–0.5)
Eosinophils Relative: 0 %
HCT: 43.6 % (ref 39.0–52.0)
Hemoglobin: 15.2 g/dL (ref 13.0–17.0)
Immature Granulocytes: 0 %
Lymphocytes Relative: 16 %
Lymphs Abs: 1.8 10*3/uL (ref 0.7–4.0)
MCH: 30.5 pg (ref 26.0–34.0)
MCHC: 34.9 g/dL (ref 30.0–36.0)
MCV: 87.6 fL (ref 80.0–100.0)
Monocytes Absolute: 0.5 10*3/uL (ref 0.1–1.0)
Monocytes Relative: 4 %
Neutro Abs: 9.3 10*3/uL — ABNORMAL HIGH (ref 1.7–7.7)
Neutrophils Relative %: 80 %
Platelets: 327 10*3/uL (ref 150–400)
RBC: 4.98 MIL/uL (ref 4.22–5.81)
RDW: 12.2 % (ref 11.5–15.5)
WBC: 11.6 10*3/uL — ABNORMAL HIGH (ref 4.0–10.5)
nRBC: 0 % (ref 0.0–0.2)

## 2021-05-20 LAB — BLOOD GAS, VENOUS
Acid-Base Excess: 8.5 mmol/L — ABNORMAL HIGH (ref 0.0–2.0)
Bicarbonate: 31.2 mmol/L — ABNORMAL HIGH (ref 20.0–28.0)
FIO2: 21
O2 Saturation: 51.9 %
Patient temperature: 37
pCO2, Ven: 38.6 mmHg — ABNORMAL LOW (ref 44.0–60.0)
pH, Ven: 7.527 — ABNORMAL HIGH (ref 7.250–7.430)
pO2, Ven: <31 mmHg — CL (ref 32.0–45.0)

## 2021-05-20 LAB — URINALYSIS, ROUTINE W REFLEX MICROSCOPIC
Bacteria, UA: NONE SEEN
Bilirubin Urine: NEGATIVE
Glucose, UA: >=500 mg/dL — AB
Ketones, ur: 5 mg/dL — AB
Leukocytes,Ua: NEGATIVE
Nitrite: NEGATIVE
Protein, ur: 100 mg/dL — AB
Specific Gravity, Urine: 1.013 (ref 1.005–1.030)
pH: 9 — ABNORMAL HIGH (ref 5.0–8.0)

## 2021-05-20 LAB — COMPREHENSIVE METABOLIC PANEL
ALT: 17 U/L (ref 0–44)
AST: 17 U/L (ref 15–41)
Albumin: 4.2 g/dL (ref 3.5–5.0)
Alkaline Phosphatase: 101 U/L (ref 38–126)
Anion gap: 13 (ref 5–15)
BUN: 27 mg/dL — ABNORMAL HIGH (ref 6–20)
CO2: 30 mmol/L (ref 22–32)
Calcium: 9.6 mg/dL (ref 8.9–10.3)
Chloride: 90 mmol/L — ABNORMAL LOW (ref 98–111)
Creatinine, Ser: 1.27 mg/dL — ABNORMAL HIGH (ref 0.61–1.24)
GFR, Estimated: >60 mL/min (ref >60)
Glucose, Bld: 210 mg/dL — ABNORMAL HIGH (ref 70–99)
Potassium: 3.1 mmol/L — ABNORMAL LOW (ref 3.5–5.1)
Sodium: 133 mmol/L — ABNORMAL LOW (ref 135–145)
Total Bilirubin: 0.8 mg/dL (ref 0.3–1.2)
Total Protein: 8.7 g/dL — ABNORMAL HIGH (ref 6.5–8.1)

## 2021-05-20 LAB — LACTIC ACID, PLASMA: Lactic Acid, Venous: 2.5 mmol/L (ref 0.5–1.9)

## 2021-05-20 LAB — TROPONIN I (HIGH SENSITIVITY): Troponin I (High Sensitivity): 9 ng/L (ref <18)

## 2021-05-20 LAB — CBG MONITORING, ED
Glucose-Capillary: 256 mg/dL — ABNORMAL HIGH (ref 70–99)
Glucose-Capillary: 132 mg/dL — ABNORMAL HIGH (ref 70–99)

## 2021-05-20 LAB — LIPASE, BLOOD: Lipase: 25 U/L (ref 11–51)

## 2021-05-20 LAB — BETA-HYDROXYBUTYRIC ACID: Beta-Hydroxybutyric Acid: 0.28 mmol/L — ABNORMAL HIGH (ref 0.05–0.27)

## 2021-05-20 MED ORDER — SODIUM CHLORIDE 0.9 % IV BOLUS
1000.0000 mL | Freq: Once | INTRAVENOUS | Status: AC
  Administered 2021-05-20: 1000 mL via INTRAVENOUS

## 2021-05-20 MED ORDER — ONDANSETRON HCL 4 MG/2ML IJ SOLN
4.0000 mg | Freq: Once | INTRAMUSCULAR | Status: AC
  Administered 2021-05-20: 4 mg via INTRAVENOUS
  Filled 2021-05-20: qty 2

## 2021-05-20 MED ORDER — HYDRALAZINE HCL 20 MG/ML IJ SOLN
10.0000 mg | Freq: Once | INTRAMUSCULAR | Status: AC
  Administered 2021-05-20: 10 mg via INTRAVENOUS
  Filled 2021-05-20: qty 1

## 2021-05-20 MED ORDER — POTASSIUM CHLORIDE CRYS ER 20 MEQ PO TBCR: 20.0000 meq | EXTENDED_RELEASE_TABLET | Freq: Two times a day (BID) | ORAL | 0 refills | Status: DC

## 2021-05-20 MED ORDER — LISINOPRIL 10 MG PO TABS
10.0000 mg | ORAL_TABLET | Freq: Once | ORAL | Status: AC
  Administered 2021-05-20: 10 mg via ORAL
  Filled 2021-05-20: qty 1

## 2021-05-20 MED ORDER — INSULIN ASPART 100 UNIT/ML IJ SOLN
6.0000 [IU] | Freq: Once | INTRAMUSCULAR | Status: DC
  Filled 2021-05-20: qty 1

## 2021-05-20 MED ORDER — POTASSIUM CHLORIDE 10 MEQ/100ML IV SOLN
10.0000 meq | INTRAVENOUS | Status: AC
  Administered 2021-05-20: 10 meq via INTRAVENOUS
  Filled 2021-05-20: qty 100

## 2021-05-20 MED ORDER — POTASSIUM CHLORIDE CRYS ER 20 MEQ PO TBCR
40.0000 meq | EXTENDED_RELEASE_TABLET | Freq: Once | ORAL | Status: AC
  Administered 2021-05-20: 40 meq via ORAL
  Filled 2021-05-20: qty 2

## 2021-05-20 MED ORDER — ONDANSETRON 4 MG PO TBDP: 4.0000 mg | ORAL_TABLET | Freq: Three times a day (TID) | ORAL | 0 refills | Status: DC | PRN

## 2021-05-20 NOTE — ED Notes (Signed)
Pt tolerated fluids 

## 2021-05-20 NOTE — ED Notes (Signed)
Pt tolerated PO fluids well. No N/V

## 2021-05-20 NOTE — ED Notes (Signed)
Urinal provided to patient to provide urine sample.

## 2021-05-20 NOTE — ED Triage Notes (Signed)
Pt c/o emesis, weakness x 3 days, pt type 1 DM and has not checked his CBG since Monday

## 2021-05-20 NOTE — Discharge Instructions (Addendum)
Take the medicines as prescribed.  Make sure you are taking your home blood pressure medication for better control of your blood pressure.  As discussed following up with a gastroenterologist make sense as the next best step.  You may need to have Dahlia Client make this referral for you, Dr. Jena Gauss is on-call for [place] today, refer to his information below.  Make sure you are able to maintain plenty of fluids at home to help prevent further dehydration.  A bland diet for the next 24 hours is recommended, slowly increase in your diet as tolerated.

## 2021-05-20 NOTE — ED Provider Notes (Signed)
[facility] EMERGENCY DEPARTMENT Provider Note   CSN: [id number] Arrival date & time: 05/20/21  1108     History Chief Complaint  Patient presents with  . Emesis    Zachary Winters is a 40 y.o. male with a history of type 2 diabetes, hypertension, history of dehydration and was admitted for lactic acidosis last month presenting with a 4-day history of intractable nausea and vomiting.  He states he was at a local fast food restaurant 4 days ago, ate several bites of his burger and lost his appetite and has been unable to eat or drink anything since.  He reports intractable vomiting, initially vomited up his meal, since then has had none bloody, bilious vomiting, now frequent dry heaves.  He reports general weakness for the past 3 days.  He has had decreased urine output.  He does endorse epigastric pain which is waxing and waning in character.  He has continued to take his insulin throughout this illness.  He denies fevers or chills, cough, shortness of breath, dysuria, no diarrhea.  He states a family member ate the same food on Monday and is well, doubts food poisoning.  He has had no treatment for his symptoms prior to arrival.  Of note he taken no other medicines this week including his lisinopril.  HPI     Past Medical History:  Diagnosis Date  . Diabetes mellitus without complication [facility])     Patient Active Problem List   Diagnosis Date Noted  . Dehydration   . Lactic acidosis 03/14/2021  . SIRS (systemic inflammatory response syndrome) ([place]) 03/14/2021  . Abdominal pain with vomiting 03/14/2021  . Somnolence 03/14/2021  . Intractable nausea and vomiting   . Type 2 diabetes mellitus with diabetic neuropathy ([place]) 01/01/2020  . Hyperlipidemia associated with type 2 diabetes mellitus ([place]) 01/01/2020  . Hypertension associated with diabetes ([place]) 10/31/2019  . Essential hypertension 10/31/2019    History reviewed. No pertinent surgical history.     History reviewed.  No pertinent family history.  Social History   Tobacco Use  . Smoking status: Current Every Day Smoker    Packs/day: 0.25    Years: 20.00    Pack years: 5.00    Types: Cigarettes  . Smokeless tobacco: Never Used  Vaping Use  . Vaping Use: Never used  Substance Use Topics  . Alcohol use: Yes  . Drug use: Yes    Frequency: 2.0 times per week    Types: Marijuana    Home Medications Prior to Admission medications   Medication Sig Start Date End Date Taking? Authorizing Provider  HUMULIN 70/30 (70-30) 100 UNIT/ML injection Inject 18 Units into the skin 2 (two) times daily with a meal. 03/15/21  Yes Barton Dubois, MD  lisinopril (ZESTRIL) 10 MG tablet Take 1 tablet (10 mg total) by mouth daily. 03/15/21  Yes Barton Dubois, MD  ondansetron (ZOFRAN ODT) 4 MG disintegrating tablet Take 1 tablet (4 mg total) by mouth every 8 (eight) hours as needed for nausea or vomiting. 05/20/21  Yes Erven Ramson, Almyra Free, PA-C  ondansetron (ZOFRAN) 4 MG tablet Take 1 tablet (4 mg total) by mouth every 6 (six) hours. 04/18/21  Yes Triplett, Tammy, PA-C  potassium chloride SA (KLOR-CON) 20 MEQ tablet Take 1 tablet (20 mEq total) by mouth 2 (two) times daily for 7 days. 05/20/21 05/27/21 Yes Sylvan Sookdeo, Almyra Free, PA-C  atorvastatin (LIPITOR) 10 MG tablet Take 1 tablet (10 mg total) by mouth daily. Patient not taking: No sig reported 01/01/20  Perlie Mayo, NP  blood glucose meter kit and supplies Dispense based on patient and insurance preference. Check blood sugar four times daily as directed. (FOR ICD-10 E10.9, E11.9). 01/01/20   Perlie Mayo, NP  cyclobenzaprine (FLEXERIL) 10 MG tablet Take 1 tablet (10 mg total) by mouth 2 (two) times daily as needed for muscle spasms. Patient not taking: Reported on 04/20/2021 10/16/19   Gareth Morgan, MD  gabapentin (NEURONTIN) 100 MG capsule Take 1 capsule (100 mg total) by mouth at bedtime. Patient not taking: No sig reported 01/01/20   Perlie Mayo, NP  Insulin Syringe-Needle U-100  [facility] INSULIN SYRINGES 31GX5/16") 31G X 5/16" 0.3 ML MISC Use as instructed to inject insulin twice a day. 03/15/21   Barton Dubois, MD    Allergies    Metformin and Penicillins  Review of Systems   Review of Systems  Constitutional: Negative for fever.  HENT: Negative for congestion and sore throat.   Eyes: Negative.   Respiratory: Negative for chest tightness and shortness of breath.   Cardiovascular: Negative for chest pain.  Gastrointestinal: Positive for abdominal pain, nausea and vomiting.  Genitourinary: Negative.   Musculoskeletal: Negative for arthralgias, joint swelling and neck pain.  Skin: Negative.  Negative for rash and wound.  Neurological: Negative for dizziness, weakness, light-headedness, numbness and headaches.  Psychiatric/Behavioral: Negative.     Physical Exam Updated Vital Signs BP (!) 155/112   Pulse (!) 108   Temp 97.6 F (36.4 C) (Oral)   Resp (!) 22   Ht 6' (1.829 m)   Wt 65.8 kg   SpO2 100%   BMI 19.67 kg/m   Physical Exam Vitals and nursing note reviewed.  Constitutional:      Appearance: He is well-developed.  HENT:     Head: Normocephalic and atraumatic.  Eyes:     Conjunctiva/sclera: Conjunctivae normal.  Cardiovascular:     Rate and Rhythm: Regular rhythm. Tachycardia present.     Heart sounds: Normal heart sounds.  Pulmonary:     Effort: Pulmonary effort is normal.     Breath sounds: Normal breath sounds. No wheezing.  Abdominal:     General: Bowel sounds are normal. There is no distension.     Palpations: Abdomen is soft.     Tenderness: There is abdominal tenderness in the epigastric area. There is no guarding or rebound. Negative signs include Murphy's sign.  Musculoskeletal:        General: Normal range of motion.     Cervical back: Normal range of motion.  Skin:    General: Skin is warm and dry.  Neurological:     Mental Status: He is alert.     ED Results / Procedures / Treatments   Labs (all labs ordered are  listed, but only abnormal results are displayed) Labs Reviewed  CBC WITH DIFFERENTIAL/PLATELET - Abnormal; Notable for the following components:      Result Value   WBC 11.6 (*)    Neutro Abs 9.3 (*)    All other components within normal limits  COMPREHENSIVE METABOLIC PANEL - Abnormal; Notable for the following components:   Sodium 133 (*)    Potassium 3.1 (*)    Chloride 90 (*)    Glucose, Bld 210 (*)    BUN 27 (*)    Creatinine, Ser 1.27 (*)    Total Protein 8.7 (*)    All other components within normal limits  URINALYSIS, ROUTINE W REFLEX MICROSCOPIC - Abnormal; Notable for the following components:  pH 9.0 (*)    Glucose, UA >=500 (*)    Hgb urine dipstick SMALL (*)    Ketones, ur 5 (*)    Protein, ur 100 (*)    All other components within normal limits  BETA-HYDROXYBUTYRIC ACID - Abnormal; Notable for the following components:   Beta-Hydroxybutyric Acid 0.28 (*)    All other components within normal limits  LACTIC ACID, PLASMA - Abnormal; Notable for the following components:   Lactic Acid, Venous 2.5 (*)    All other components within normal limits  BLOOD GAS, VENOUS - Abnormal; Notable for the following components:   pH, Ven 7.527 (*)    pCO2, Ven 38.6 (*)    pO2, Ven <31.0 (*)    Bicarbonate 31.2 (*)    Acid-Base Excess 8.5 (*)    All other components within normal limits  CBG MONITORING, ED - Abnormal; Notable for the following components:   Glucose-Capillary 256 (*)    All other components within normal limits  CBG MONITORING, ED - Abnormal; Notable for the following components:   Glucose-Capillary 132 (*)    All other components within normal limits  LIPASE, BLOOD  TROPONIN I (HIGH SENSITIVITY)    EKG None  Radiology No results found.  Procedures Procedures   Medications Ordered in ED Medications  potassium chloride 10 mEq in 100 mL IVPB (10 mEq Intravenous New Bag/Given 05/20/21 1634)  hydrALAZINE (APRESOLINE) injection 10 mg (has no  administration in time range)  sodium chloride 0.9 % bolus 1,000 mL (0 mLs Intravenous Stopped 05/20/21 1307)  ondansetron (ZOFRAN) injection 4 mg (4 mg Intravenous Given 05/20/21 1202)  sodium chloride 0.9 % bolus 1,000 mL (0 mLs Intravenous Stopped 05/20/21 1348)  lisinopril (ZESTRIL) tablet 10 mg (10 mg Oral Given 05/20/21 1331)  potassium chloride SA (KLOR-CON) CR tablet 40 mEq (40 mEq Oral Given 05/20/21 1429)  sodium chloride 0.9 % bolus 1,000 mL (1,000 mLs Intravenous New Bag/Given 05/20/21 1628)    ED Course  I have reviewed the triage vital signs and the nursing notes.  Pertinent labs & imaging results that were available during my care of the patient were reviewed by me and considered in my medical decision making (see chart for details).    MDM Rules/Calculators/A&P                          Patient with intractable nausea and vomiting with elevated CBG, continues taking his insulin despite no p.o. intake x4 days.  He endorses mild upper abdominal discomfort which is worsened with vomiting, better after episodes of vomiting, weakness, concern for dehydration.  No chest pain or shortness of breath, doubt this is a cardiac equivalent.  His lab work is significant for dehydration with mild ketonuria at 5, specific gravity is concentrated, high end of normal.  His initial glucose was 210, he has moderate hypokalemia with a potassium of 3.1, BUN and creatinine are elevated at 27 and 1.27 respectively suggesting moderate dehydration.  He has an elevated lactic acid, this does not represent sepsis, his blood gas shows a pH elevation at 7.5-7, his PO2 and his P CO2 are reduced, suggesting compensating metabolic alkalosis, probably induced by the vomiting itself.  He was given an IV dose of Zofran and he had no further problems with nausea or vomiting.  He was given a total of 3 L of normal saline after he continued to be tachycardic after the first and second liter.  He was  also given both p.o.  potassium and an IV run of 10 mEq of potassium x2 with his initial potassium of 3.1, will be diluted with the IV fluids received.  Patient has been able to tolerate both p.o. fluid and a snack here without return of nausea or vomiting.  At reexam is abdomen is nontender, no acute findings on his serial abdominal exams.   BP remains elevated after receiving his missed dose of lisinopril (has missed x 4 days).  Denies cp, sob, headache, vision changes.  Given IV dose of hydralazine.  Encouraged taking his daily lisinopril.  Discussed differential diagnosis - diabetic gastroparesis - possible as he has been diagnosed with peripheral neuropathy - he would benefit from GI eval - referral given to Dr. Gala Romney.  Non acute abd exam, lft's and lipase normal, no Murphy sign, ct abd completed 4/24 at his last visit here for similar sx negative for acute findings including gallstones. No indication for repeat imaging today.  Discussed marijuana intake - pt endorses 1 joint of marijuana weekly on average, denies any recognized pattern to episodes of n/v and marijuana use, hyperemesis syndrome felt less likely.    He will be discharged home with close follow-up with his PCP, returning here for any worsening of symptoms. He refused to stay for his 2nd run of K+, but received 1 run in addition to PO 40 meq.  No further n/v.    Return precautions discussed. Final Clinical Impression(s) / ED Diagnoses Final diagnoses:  Intractable vomiting with nausea, unspecified vomiting type  Primary hypertension  Hypokalemia    Rx / DC Orders ED Discharge Orders         Ordered    ondansetron (ZOFRAN ODT) 4 MG disintegrating tablet  Every 8 hours PRN        05/20/21 1818    potassium chloride SA (KLOR-CON) 20 MEQ tablet  2 times daily        05/20/21 1818           Evalee Jefferson, Hershal Coria 05/20/21 1823    Lajean Saver, MD 05/26/21 1353

## 2021-07-31 ENCOUNTER — Other Ambulatory Visit: Payer: Self-pay

## 2021-07-31 ENCOUNTER — Emergency Department (HOSPITAL_COMMUNITY): Payer: Self-pay

## 2021-07-31 ENCOUNTER — Emergency Department (HOSPITAL_COMMUNITY)
Admission: EM | Admit: 2021-07-31 | Discharge: 2021-07-31 | Disposition: A | Discharge status: Home / Self Care | Payer: Self-pay | Attending: Emergency Medicine | Admitting: Emergency Medicine

## 2021-07-31 ENCOUNTER — Encounter (HOSPITAL_COMMUNITY): Payer: Self-pay | Admitting: *Deleted

## 2021-07-31 DIAGNOSIS — E119 Type 2 diabetes mellitus without complications: Secondary | ICD-10-CM | POA: Insufficient documentation

## 2021-07-31 DIAGNOSIS — Z20822 Contact with and (suspected) exposure to covid-19: Secondary | ICD-10-CM | POA: Insufficient documentation

## 2021-07-31 DIAGNOSIS — I10 Essential (primary) hypertension: Secondary | ICD-10-CM | POA: Insufficient documentation

## 2021-07-31 DIAGNOSIS — K2970 Gastritis, unspecified, without bleeding: Secondary | ICD-10-CM | POA: Insufficient documentation

## 2021-07-31 DIAGNOSIS — R112 Nausea with vomiting, unspecified: Secondary | ICD-10-CM

## 2021-07-31 DIAGNOSIS — Z79899 Other long term (current) drug therapy: Secondary | ICD-10-CM | POA: Insufficient documentation

## 2021-07-31 DIAGNOSIS — Z794 Long term (current) use of insulin: Secondary | ICD-10-CM | POA: Insufficient documentation

## 2021-07-31 DIAGNOSIS — E860 Dehydration: Secondary | ICD-10-CM | POA: Insufficient documentation

## 2021-07-31 DIAGNOSIS — F17210 Nicotine dependence, cigarettes, uncomplicated: Secondary | ICD-10-CM | POA: Insufficient documentation

## 2021-07-31 LAB — COMPREHENSIVE METABOLIC PANEL
ALT: 17 U/L (ref 0–44)
AST: 17 U/L (ref 15–41)
Albumin: 4.7 g/dL (ref 3.5–5.0)
Alkaline Phosphatase: 102 U/L (ref 38–126)
Anion gap: 13 (ref 5–15)
BUN: 31 mg/dL — ABNORMAL HIGH (ref 6–20)
CO2: 31 mmol/L (ref 22–32)
Calcium: 9.7 mg/dL (ref 8.9–10.3)
Chloride: 90 mmol/L — ABNORMAL LOW (ref 98–111)
Creatinine, Ser: 1.35 mg/dL — ABNORMAL HIGH (ref 0.61–1.24)
GFR, Estimated: >60 mL/min (ref >60)
Glucose, Bld: 358 mg/dL — ABNORMAL HIGH (ref 70–99)
Potassium: 3.3 mmol/L — ABNORMAL LOW (ref 3.5–5.1)
Sodium: 134 mmol/L — ABNORMAL LOW (ref 135–145)
Total Bilirubin: 1 mg/dL (ref 0.3–1.2)
Total Protein: 8.7 g/dL — ABNORMAL HIGH (ref 6.5–8.1)

## 2021-07-31 LAB — CBC
HCT: 43.5 % (ref 39.0–52.0)
Hemoglobin: 15.2 g/dL (ref 13.0–17.0)
MCH: 31.2 pg (ref 26.0–34.0)
MCHC: 34.9 g/dL (ref 30.0–36.0)
MCV: 89.3 fL (ref 80.0–100.0)
Platelets: 313 10*3/uL (ref 150–400)
RBC: 4.87 MIL/uL (ref 4.22–5.81)
RDW: 12.6 % (ref 11.5–15.5)
WBC: 12.1 10*3/uL — ABNORMAL HIGH (ref 4.0–10.5)
nRBC: 0 % (ref 0.0–0.2)

## 2021-07-31 LAB — LIPASE, BLOOD: Lipase: 33 U/L (ref 11–51)

## 2021-07-31 LAB — RESP PANEL BY RT-PCR (FLU A&B, COVID) ARPGX2
Influenza A by PCR: NEGATIVE
Influenza B by PCR: NEGATIVE
SARS Coronavirus 2 by RT PCR: NEGATIVE

## 2021-07-31 LAB — CBG MONITORING, ED: Glucose-Capillary: 361 mg/dL — ABNORMAL HIGH (ref 70–99)

## 2021-07-31 MED ORDER — ONDANSETRON HCL 4 MG/2ML IJ SOLN
4.0000 mg | Freq: Once | INTRAMUSCULAR | Status: AC
  Administered 2021-07-31: 4 mg via INTRAVENOUS
  Filled 2021-07-31: qty 2

## 2021-07-31 MED ORDER — PROMETHAZINE HCL 25 MG RE SUPP: 25.0000 mg | Freq: Four times a day (QID) | RECTAL | 0 refills | Status: DC | PRN

## 2021-07-31 MED ORDER — PANTOPRAZOLE SODIUM 20 MG PO TBEC: 20.0000 mg | DELAYED_RELEASE_TABLET | Freq: Every day | ORAL | 1 refills | Status: DC

## 2021-07-31 MED ORDER — PANTOPRAZOLE SODIUM 40 MG IV SOLR
40.0000 mg | Freq: Once | INTRAVENOUS | Status: AC
  Administered 2021-07-31: 40 mg via INTRAVENOUS
  Filled 2021-07-31: qty 40

## 2021-07-31 MED ORDER — SODIUM CHLORIDE 0.9 % IV BOLUS
1000.0000 mL | Freq: Once | INTRAVENOUS | Status: AC
  Administered 2021-07-31: 1000 mL via INTRAVENOUS

## 2021-07-31 MED ORDER — METOPROLOL TARTRATE 5 MG/5ML IV SOLN: 5.0000 mg | Freq: Once | INTRAVENOUS | Status: DC

## 2021-07-31 NOTE — ED Provider Notes (Signed)
[facility] EMERGENCY DEPARTMENT Provider Note   CSN: [id number] Arrival date & time: 07/31/21  1956     History Chief Complaint  Patient presents with   Emesis    Zachary Winters is a 40 y.o. male.  Patient complains of nausea and vomiting for 3 days mild epigastric discomfort  The history is provided by the patient and medical records. No language interpreter was used.  Emesis Severity:  Moderate Duration:  3 days Timing:  Constant Quality:  Stomach contents Able to tolerate:  Liquids Progression:  Unchanged Chronicity:  New Recent urination:  Normal Context: not post-tussive   Relieved by:  Nothing Worsened by:  Nothing Associated symptoms: no abdominal pain, no arthralgias, no chills, no cough, no fever and no sore throat       Past Medical History:  Diagnosis Date   Diabetes mellitus without complication ([place])     Patient Active Problem List   Diagnosis Date Noted   Dehydration    Lactic acidosis 03/14/2021   SIRS (systemic inflammatory response syndrome) ([place]) 03/14/2021   Abdominal pain with vomiting 03/14/2021   Somnolence 03/14/2021   Intractable nausea and vomiting    Type 2 diabetes mellitus with diabetic neuropathy ([place]) 01/01/2020   Hyperlipidemia associated with type 2 diabetes mellitus ([place]) 01/01/2020   Hypertension associated with diabetes ([place]) 10/31/2019   Essential hypertension 10/31/2019    History reviewed. No pertinent surgical history.     History reviewed. No pertinent family history.  Social History   Tobacco Use   Smoking status: Every Day    Packs/day: 0.25    Years: 20.00    Pack years: 5.00    Types: Cigarettes   Smokeless tobacco: Never  Vaping Use   Vaping Use: Never used  Substance Use Topics   Alcohol use: Yes   Drug use: Yes    Frequency: 2.0 times per week    Types: Marijuana    Home Medications Prior to Admission medications   Medication Sig Start Date End Date Taking? Authorizing Provider   pantoprazole (PROTONIX) 20 MG tablet Take 1 tablet (20 mg total) by mouth daily. 07/31/21  Yes Milton Ferguson, MD  promethazine (PHENERGAN) 25 MG suppository Place 1 suppository (25 mg total) rectally every 6 (six) hours as needed for nausea or vomiting. 07/31/21  Yes Milton Ferguson, MD  atorvastatin (LIPITOR) 10 MG tablet Take 1 tablet (10 mg total) by mouth daily. Patient not taking: No sig reported 01/01/20   Perlie Mayo, NP  blood glucose meter kit and supplies Dispense based on patient and insurance preference. Check blood sugar four times daily as directed. (FOR ICD-10 E10.9, E11.9). 01/01/20   Perlie Mayo, NP  cyclobenzaprine (FLEXERIL) 10 MG tablet Take 1 tablet (10 mg total) by mouth 2 (two) times daily as needed for muscle spasms. Patient not taking: Reported on 04/20/2021 10/16/19   Gareth Morgan, MD  gabapentin (NEURONTIN) 100 MG capsule Take 1 capsule (100 mg total) by mouth at bedtime. Patient not taking: No sig reported 01/01/20   Perlie Mayo, NP  HUMULIN 70/30 (70-30) 100 UNIT/ML injection Inject 18 Units into the skin 2 (two) times daily with a meal. 03/15/21   Barton Dubois, MD  Insulin Syringe-Needle U-100 [facility] INSULIN SYRINGES 31GX5/16") 31G X 5/16" 0.3 ML MISC Use as instructed to inject insulin twice a day. 03/15/21   Barton Dubois, MD  lisinopril (ZESTRIL) 10 MG tablet Take 1 tablet (10 mg total) by mouth daily. 03/15/21   Barton Dubois,  MD  ondansetron (ZOFRAN ODT) 4 MG disintegrating tablet Take 1 tablet (4 mg total) by mouth every 8 (eight) hours as needed for nausea or vomiting. 05/20/21   Idol, Almyra Free, PA-C  ondansetron (ZOFRAN) 4 MG tablet Take 1 tablet (4 mg total) by mouth every 6 (six) hours. 04/18/21   Triplett, Tammy, PA-C  potassium chloride SA (KLOR-CON) 20 MEQ tablet Take 1 tablet (20 mEq total) by mouth 2 (two) times daily for 7 days. 05/20/21 05/27/21  Evalee Jefferson, PA-C    Allergies    Metformin and Penicillins  Review of Systems   Review of Systems   Constitutional:  Negative for chills and fever.  HENT:  Negative for ear pain and sore throat.   Eyes:  Negative for pain and visual disturbance.  Respiratory:  Negative for cough and shortness of breath.   Cardiovascular:  Negative for chest pain and palpitations.  Gastrointestinal:  Positive for vomiting. Negative for abdominal pain.  Genitourinary:  Negative for dysuria and hematuria.  Musculoskeletal:  Negative for arthralgias and back pain.  Skin:  Negative for color change and rash.  Neurological:  Negative for seizures and syncope.  All other systems reviewed and are negative.  Physical Exam Updated Vital Signs BP (!) 166/99   Pulse 86   Temp 98 F (36.7 C) (Oral)   Resp 16   Ht _0  (1.854 m)   Wt 65.3 kg   SpO2 97%   BMI 19.00 kg/m   Physical Exam Vitals and nursing note reviewed.  Constitutional:      Appearance: He is well-developed.  HENT:     Head: Normocephalic.     Nose: Nose normal.  Eyes:     General: No scleral icterus.    Conjunctiva/sclera: Conjunctivae normal.  Neck:     Thyroid: No thyromegaly.  Cardiovascular:     Rate and Rhythm: Normal rate and regular rhythm.     Heart sounds: No murmur heard.   No friction rub. No gallop.  Pulmonary:     Breath sounds: No stridor. No wheezing or rales.  Chest:     Chest wall: No tenderness.  Abdominal:     General: There is no distension.     Tenderness: There is no abdominal tenderness. There is no rebound.     Comments: Minimal epigastric tenderness  Musculoskeletal:        General: Normal range of motion.     Cervical back: Neck supple.  Lymphadenopathy:     Cervical: No cervical adenopathy.  Skin:    Findings: No erythema or rash.  Neurological:     Mental Status: He is alert and oriented to person, place, and time.     Motor: No abnormal muscle tone.     Coordination: Coordination normal.  Psychiatric:        Behavior: Behavior normal.    ED Results / Procedures / Treatments    Labs (all labs ordered are listed, but only abnormal results are displayed) Labs Reviewed  COMPREHENSIVE METABOLIC PANEL - Abnormal; Notable for the following components:      Result Value   Sodium 134 (*)    Potassium 3.3 (*)    Chloride 90 (*)    Glucose, Bld 358 (*)    BUN 31 (*)    Creatinine, Ser 1.35 (*)    Total Protein 8.7 (*)    All other components within normal limits  CBC - Abnormal; Notable for the following components:   WBC 12.1 (*)  All other components within normal limits  CBG MONITORING, ED - Abnormal; Notable for the following components:   Glucose-Capillary 361 (*)    All other components within normal limits  RESP PANEL BY RT-PCR (FLU A&B, COVID) ARPGX2  LIPASE, BLOOD  URINALYSIS, ROUTINE W REFLEX MICROSCOPIC    EKG None  Radiology DG ABD ACUTE 2+V W 1V CHEST  Result Date: 07/31/2021 CLINICAL DATA:  Vomiting, chills and poor appetite for 3 days, history of diabetes EXAM: DG ABDOMEN ACUTE WITH 1 VIEW CHEST COMPARISON:  CT 04/18/2021, chest radiograph 04/18/2021 FINDINGS: There is no evidence of dilated bowel loops or free intraperitoneal air. Abdominal vascular calcification. No radiopaque calculi or other significant radiographic abnormality is seen. No consolidation, features of edema, pneumothorax, or effusion. Pulmonary vascularity is normally distributed. The cardiomediastinal contours are unremarkable. No other acute osseous or soft tissue abnormality. IMPRESSION: Negative abdominal radiographs. No acute cardiopulmonary disease. Electronically Signed   By: Lovena Le M.D.   On: 07/31/2021 22:35    Procedures Procedures   Medications Ordered in ED Medications  metoprolol tartrate (LOPRESSOR) injection 5 mg (has no administration in time range)  sodium chloride 0.9 % bolus 1,000 mL (0 mLs Intravenous Stopped 07/31/21 2228)  ondansetron (ZOFRAN) injection 4 mg (4 mg Intravenous Given 07/31/21 2129)  sodium chloride 0.9 % bolus 1,000 mL (0 mLs  Intravenous Stopped 07/31/21 2201)  pantoprazole (PROTONIX) injection 40 mg (40 mg Intravenous Given 07/31/21 2129)  sodium chloride 0.9 % bolus 1,000 mL (0 mLs Intravenous Stopped 07/31/21 2252)    ED Course  I have reviewed the triage vital signs and the nursing notes.  Pertinent labs & imaging results that were available during my care of the patient were reviewed by me and considered in my medical decision making (see chart for details).    MDM Rules/Calculators/A&P                          Patient with dehydration and gastritis.  He is improved with IV fluids and will be discharged home with protonic and Phenergan and follow-up with his PCP  Final Clinical Impression(s) / ED Diagnoses Final diagnoses:  Non-intractable vomiting with nausea, unspecified vomiting type  Dehydration    Rx / DC Orders ED Discharge Orders          Ordered    promethazine (PHENERGAN) 25 MG suppository  Every 6 hours PRN        07/31/21 2251    pantoprazole (PROTONIX) 20 MG tablet  Daily        07/31/21 2251             Milton Ferguson, MD 08/01/21 [phone]

## 2021-07-31 NOTE — ED Notes (Signed)
CBG 361 

## 2021-07-31 NOTE — ED Triage Notes (Signed)
Pt with N/V, chills and poor appetite x 3 days.  Denies any known covid exposure.

## 2021-07-31 NOTE — ED Notes (Signed)
Pt attempted to ambulate after discharge, pt unsteady on his feet, pt guided to wheelchair, provider reassessed pt and determined he could continue to be discharged if he had a ride, pt agrees with plan for dc

## 2021-07-31 NOTE — Discharge Instructions (Addendum)
Take liquids only for the next 24 hours and then advance your diet.  Follow-up with your family doctor to this week for recheck

## 2021-09-05 ENCOUNTER — Emergency Department (HOSPITAL_COMMUNITY)
Admission: EM | Admit: 2021-09-05 | Discharge: 2021-09-06 | Disposition: A | Discharge status: Home / Self Care | Payer: Medicaid Other | Attending: Emergency Medicine | Admitting: Emergency Medicine

## 2021-09-05 ENCOUNTER — Encounter (HOSPITAL_COMMUNITY): Payer: Self-pay

## 2021-09-05 ENCOUNTER — Other Ambulatory Visit: Payer: Self-pay

## 2021-09-05 DIAGNOSIS — E1140 Type 2 diabetes mellitus with diabetic neuropathy, unspecified: Secondary | ICD-10-CM | POA: Insufficient documentation

## 2021-09-05 DIAGNOSIS — R000 Tachycardia, unspecified: Secondary | ICD-10-CM | POA: Insufficient documentation

## 2021-09-05 DIAGNOSIS — I10 Essential (primary) hypertension: Secondary | ICD-10-CM | POA: Insufficient documentation

## 2021-09-05 DIAGNOSIS — F17210 Nicotine dependence, cigarettes, uncomplicated: Secondary | ICD-10-CM | POA: Insufficient documentation

## 2021-09-05 DIAGNOSIS — R1013 Epigastric pain: Secondary | ICD-10-CM | POA: Insufficient documentation

## 2021-09-05 DIAGNOSIS — Z794 Long term (current) use of insulin: Secondary | ICD-10-CM | POA: Insufficient documentation

## 2021-09-05 DIAGNOSIS — R112 Nausea with vomiting, unspecified: Secondary | ICD-10-CM | POA: Insufficient documentation

## 2021-09-05 DIAGNOSIS — Z79899 Other long term (current) drug therapy: Secondary | ICD-10-CM | POA: Insufficient documentation

## 2021-09-05 NOTE — ED Triage Notes (Addendum)
Pt arrived via POV c/o intractable vomiting. Pt reports symptoms similar to previous visits. Pt found hunched over in wheel chair holding emesis bag when called for Triage.

## 2021-09-06 LAB — COMPREHENSIVE METABOLIC PANEL
ALT: 15 U/L (ref 0–44)
AST: 18 U/L (ref 15–41)
Albumin: 4.4 g/dL (ref 3.5–5.0)
Alkaline Phosphatase: 101 U/L (ref 38–126)
Anion gap: 13 (ref 5–15)
BUN: 20 mg/dL (ref 6–20)
CO2: 25 mmol/L (ref 22–32)
Calcium: 8.9 mg/dL (ref 8.9–10.3)
Chloride: 96 mmol/L — ABNORMAL LOW (ref 98–111)
Creatinine, Ser: 1.02 mg/dL (ref 0.61–1.24)
GFR, Estimated: >60 mL/min (ref >60)
Glucose, Bld: 382 mg/dL — ABNORMAL HIGH (ref 70–99)
Potassium: 3.7 mmol/L (ref 3.5–5.1)
Sodium: 134 mmol/L — ABNORMAL LOW (ref 135–145)
Total Bilirubin: 0.7 mg/dL (ref 0.3–1.2)
Total Protein: 8.2 g/dL — ABNORMAL HIGH (ref 6.5–8.1)

## 2021-09-06 LAB — CBC WITH DIFFERENTIAL/PLATELET
Abs Immature Granulocytes: 0.04 10*3/uL (ref 0.00–0.07)
Basophils Absolute: 0 10*3/uL (ref 0.0–0.1)
Basophils Relative: 0 %
Eosinophils Absolute: 0 10*3/uL (ref 0.0–0.5)
Eosinophils Relative: 0 %
HCT: 43.1 % (ref 39.0–52.0)
Hemoglobin: 14.8 g/dL (ref 13.0–17.0)
Immature Granulocytes: 0 %
Lymphocytes Relative: 5 %
Lymphs Abs: 0.5 10*3/uL — ABNORMAL LOW (ref 0.7–4.0)
MCH: 30.5 pg (ref 26.0–34.0)
MCHC: 34.3 g/dL (ref 30.0–36.0)
MCV: 88.9 fL (ref 80.0–100.0)
Monocytes Absolute: 0.1 10*3/uL (ref 0.1–1.0)
Monocytes Relative: 1 %
Neutro Abs: 9.3 10*3/uL — ABNORMAL HIGH (ref 1.7–7.7)
Neutrophils Relative %: 94 %
Platelets: 269 10*3/uL (ref 150–400)
RBC: 4.85 MIL/uL (ref 4.22–5.81)
RDW: 12.4 % (ref 11.5–15.5)
WBC: 10 10*3/uL (ref 4.0–10.5)
nRBC: 0 % (ref 0.0–0.2)

## 2021-09-06 LAB — LIPASE, BLOOD: Lipase: 18 U/L (ref 11–51)

## 2021-09-06 MED ORDER — SODIUM CHLORIDE 0.9 % IV BOLUS
1000.0000 mL | Freq: Once | INTRAVENOUS | Status: AC
  Administered 2021-09-06: 1000 mL via INTRAVENOUS

## 2021-09-06 MED ORDER — DIPHENHYDRAMINE HCL 50 MG/ML IJ SOLN
25.0000 mg | Freq: Once | INTRAMUSCULAR | Status: AC
  Administered 2021-09-06: 25 mg via INTRAVENOUS
  Filled 2021-09-06: qty 1

## 2021-09-06 MED ORDER — METOCLOPRAMIDE HCL 5 MG/ML IJ SOLN
10.0000 mg | Freq: Once | INTRAMUSCULAR | Status: AC
  Administered 2021-09-06: 10 mg via INTRAVENOUS
  Filled 2021-09-06: qty 2

## 2021-09-06 MED ORDER — HALOPERIDOL LACTATE 5 MG/ML IJ SOLN
2.0000 mg | Freq: Once | INTRAMUSCULAR | Status: AC
  Administered 2021-09-06: 2 mg via INTRAVENOUS
  Filled 2021-09-06: qty 1

## 2021-09-06 MED ORDER — HYDROMORPHONE HCL 1 MG/ML IJ SOLN
0.5000 mg | Freq: Once | INTRAMUSCULAR | Status: AC
Start: 2021-09-06 — End: 2021-09-06
  Administered 2021-09-06: 0.5 mg via INTRAVENOUS
  Filled 2021-09-06: qty 1

## 2021-09-06 NOTE — ED Provider Notes (Signed)
[facility] EMERGENCY DEPARTMENT Provider Note   CSN: [id number] Arrival date & time: 09/05/21  2036     History Chief Complaint  Patient presents with   Emesis    Zachary Winters is a 40 y.o. male.  Patient presents to the emergency department for evaluation of abdominal pain with nausea and vomiting.  Patient has a history of recurrent vomiting similar to this.  Complains of upper abdominal pain.  Reports that he cannot hold anything down.  Patient reports that this episode is similar to multiple previous episodes that he has been seen and treated for in the ED.      Past Medical History:  Diagnosis Date   Diabetes mellitus without complication [facility])     Patient Active Problem List   Diagnosis Date Noted   Dehydration    Lactic acidosis 03/14/2021   SIRS (systemic inflammatory response syndrome) ([place]) 03/14/2021   Abdominal pain with vomiting 03/14/2021   Somnolence 03/14/2021   Intractable nausea and vomiting    Type 2 diabetes mellitus with diabetic neuropathy ([place]) 01/01/2020   Hyperlipidemia associated with type 2 diabetes mellitus ([place]) 01/01/2020   Hypertension associated with diabetes ([place]) 10/31/2019   Essential hypertension 10/31/2019    History reviewed. No pertinent surgical history.     History reviewed. No pertinent family history.  Social History   Tobacco Use   Smoking status: Every Day    Packs/day: 0.25    Years: 20.00    Pack years: 5.00    Types: Cigarettes   Smokeless tobacco: Never  Vaping Use   Vaping Use: Never used  Substance Use Topics   Alcohol use: Yes   Drug use: Yes    Frequency: 2.0 times per week    Types: Marijuana    Home Medications Prior to Admission medications   Medication Sig Start Date End Date Taking? Authorizing Provider  atorvastatin (LIPITOR) 10 MG tablet Take 1 tablet (10 mg total) by mouth daily. Patient not taking: No sig reported 01/01/20   Perlie Mayo, NP  blood glucose meter kit and supplies  Dispense based on patient and insurance preference. Check blood sugar four times daily as directed. (FOR ICD-10 E10.9, E11.9). 01/01/20   Perlie Mayo, NP  cyclobenzaprine (FLEXERIL) 10 MG tablet Take 1 tablet (10 mg total) by mouth 2 (two) times daily as needed for muscle spasms. Patient not taking: Reported on 04/20/2021 10/16/19   Gareth Morgan, MD  gabapentin (NEURONTIN) 100 MG capsule Take 1 capsule (100 mg total) by mouth at bedtime. Patient not taking: No sig reported 01/01/20   Perlie Mayo, NP  HUMULIN 70/30 (70-30) 100 UNIT/ML injection Inject 18 Units into the skin 2 (two) times daily with a meal. 03/15/21   Barton Dubois, MD  Insulin Syringe-Needle U-100 [facility] INSULIN SYRINGES 31GX5/16") 31G X 5/16" 0.3 ML MISC Use as instructed to inject insulin twice a day. 03/15/21   Barton Dubois, MD  lisinopril (ZESTRIL) 10 MG tablet Take 1 tablet (10 mg total) by mouth daily. 03/15/21   Barton Dubois, MD  ondansetron (ZOFRAN ODT) 4 MG disintegrating tablet Take 1 tablet (4 mg total) by mouth every 8 (eight) hours as needed for nausea or vomiting. 05/20/21   Idol, Almyra Free, PA-C  ondansetron (ZOFRAN) 4 MG tablet Take 1 tablet (4 mg total) by mouth every 6 (six) hours. 04/18/21   Triplett, Tammy, PA-C  pantoprazole (PROTONIX) 20 MG tablet Take 1 tablet (20 mg total) by mouth daily. 07/31/21   Milton Ferguson,  MD  potassium chloride SA (KLOR-CON) 20 MEQ tablet Take 1 tablet (20 mEq total) by mouth 2 (two) times daily for 7 days. 05/20/21 05/27/21  Evalee Jefferson, PA-C  promethazine (PHENERGAN) 25 MG suppository Place 1 suppository (25 mg total) rectally every 6 (six) hours as needed for nausea or vomiting. 07/31/21   Milton Ferguson, MD    Allergies    Metformin and Penicillins  Review of Systems   Review of Systems  Gastrointestinal:  Positive for abdominal pain, nausea and vomiting.  All other systems reviewed and are negative.  Physical Exam Updated Vital Signs BP (!) 141/77   Pulse (!) 105   Temp 98  F (36.7 C)   Resp 19   Ht 6' 1"  (1.854 m)   Wt 68 kg   SpO2 99%   BMI 19.79 kg/m   Physical Exam Vitals and nursing note reviewed.  Constitutional:      General: He is not in acute distress.    Appearance: Normal appearance. He is well-developed.  HENT:     Head: Normocephalic and atraumatic.     Right Ear: Hearing normal.     Left Ear: Hearing normal.     Nose: Nose normal.  Eyes:     Conjunctiva/sclera: Conjunctivae normal.     Pupils: Pupils are equal, round, and reactive to light.  Cardiovascular:     Rate and Rhythm: Regular rhythm. Tachycardia present.     Heart sounds: S1 normal and S2 normal. No murmur heard.   No friction rub. No gallop.  Pulmonary:     Effort: Pulmonary effort is normal. No respiratory distress.     Breath sounds: Normal breath sounds.  Chest:     Chest wall: No tenderness.  Abdominal:     General: Bowel sounds are normal.     Palpations: Abdomen is soft.     Tenderness: There is abdominal tenderness in the epigastric area. There is no guarding or rebound. Negative signs include Murphy's sign and McBurney's sign.     Hernia: No hernia is present.  Musculoskeletal:        General: Normal range of motion.     Cervical back: Normal range of motion and neck supple.  Skin:    General: Skin is warm and dry.     Findings: No rash.  Neurological:     Mental Status: He is alert and oriented to person, place, and time.     GCS: GCS eye subscore is 4. GCS verbal subscore is 5. GCS motor subscore is 6.     Cranial Nerves: No cranial nerve deficit.     Sensory: No sensory deficit.     Coordination: Coordination normal.  Psychiatric:        Speech: Speech normal.        Behavior: Behavior normal.        Thought Content: Thought content normal.    ED Results / Procedures / Treatments   Labs (all labs ordered are listed, but only abnormal results are displayed) Labs Reviewed  CBC WITH DIFFERENTIAL/PLATELET - Abnormal; Notable for the following  components:      Result Value   Neutro Abs 9.3 (*)    Lymphs Abs 0.5 (*)    All other components within normal limits  COMPREHENSIVE METABOLIC PANEL - Abnormal; Notable for the following components:   Sodium 134 (*)    Chloride 96 (*)    Glucose, Bld 382 (*)    Total Protein 8.2 (*)    All  other components within normal limits  LIPASE, BLOOD    EKG None  Radiology No results found.  Procedures Procedures   Medications Ordered in ED Medications  sodium chloride 0.9 % bolus 1,000 mL (0 mLs Intravenous Stopped 09/06/21 0214)    Followed by  sodium chloride 0.9 % bolus 1,000 mL (0 mLs Intravenous Stopped 09/06/21 0214)  metoCLOPramide (REGLAN) injection 10 mg (10 mg Intravenous Given 09/06/21 0035)  diphenhydrAMINE (BENADRYL) injection 25 mg (25 mg Intravenous Given 09/06/21 0035)  HYDROmorphone (DILAUDID) injection 0.5 mg (0.5 mg Intravenous Given 09/06/21 0138)  haloperidol lactate (HALDOL) injection 2 mg (2 mg Intravenous Given 09/06/21 0138)    ED Course  I have reviewed the triage vital signs and the nursing notes.  Pertinent labs & imaging results that were available during my care of the patient were reviewed by me and considered in my medical decision making (see chart for details).    MDM Rules/Calculators/A&P                           Patient presents to the emergency department for evaluation of nausea, vomiting with abdominal pain.  Presentation is similar to numerous previous visits for nausea and vomiting.  Abdominal exam benign.  Lab work is reassuring.  Patient feels improved after first round of meds.  Still has a little bit of pain, no further vomiting.  We will give a dose of analgesia and additional antiemetic, discharge.  Final Clinical Impression(s) / ED Diagnoses Final diagnoses:  Intractable vomiting with nausea, unspecified vomiting type    Rx / DC Orders ED Discharge Orders     None        Fenna Semel, Gwenyth Allegra, MD 09/06/21 [id number]

## 2021-09-06 NOTE — ED Notes (Signed)
Pt says he has been out of his BP meds for 4 days. Going to get script today.

## 2021-10-10 IMAGING — CT CT ABD-PELV W/ CM
2 of 4 series · 16 of 46 positions shown, 18 images · IV contrast (Omnipaque or Isovue)
Comparison: 03/14/2021

CLINICAL DATA: Nausea and vomiting since [REDACTED].

EXAM:
CT ABDOMEN AND PELVIS WITH CONTRAST
TECHNIQUE: Multidetector CT imaging of the abdomen and pelvis was performed
using the standard protocol following bolus administration of
intravenous contrast.
CONTRAST:  100mL OMNIPAQUE IOHEXOL 300 MG/ML  SOLN

[Series 2: axial st · axial · 0.91mm/px · z∈[-139,+291]mm · 13 of 96 slices shown, 15 images]
[im 5/96  soft-tissue]
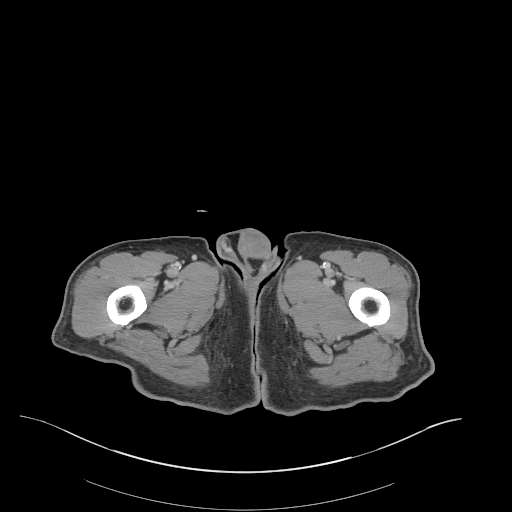
[im 5/96  bone]
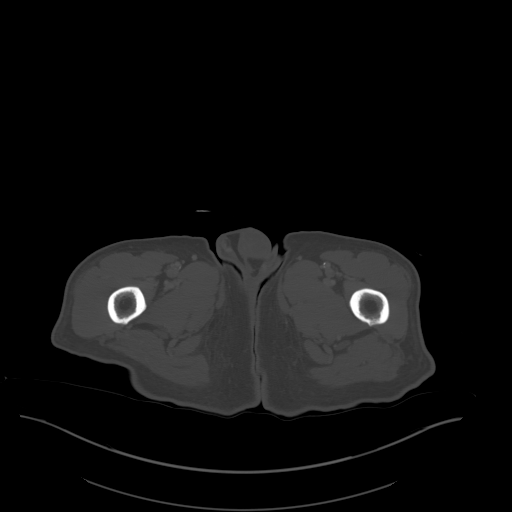
[im 13/96  soft-tissue]
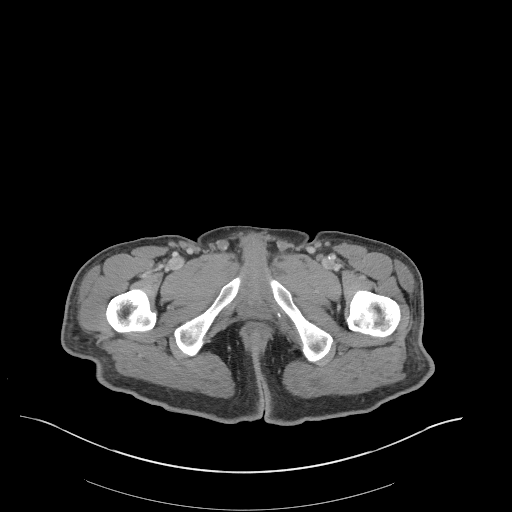
[im 22/96  soft-tissue]
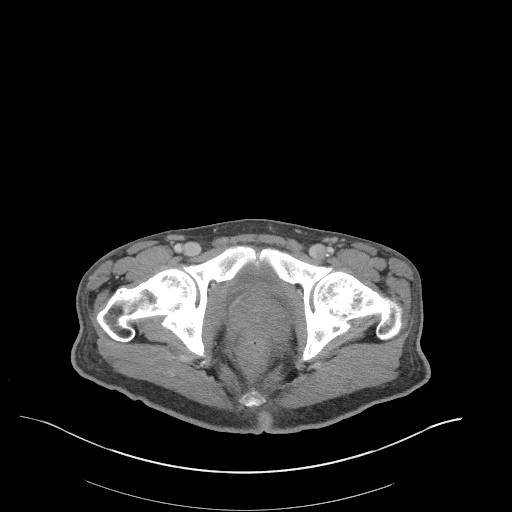
[im 26/96  soft-tissue]
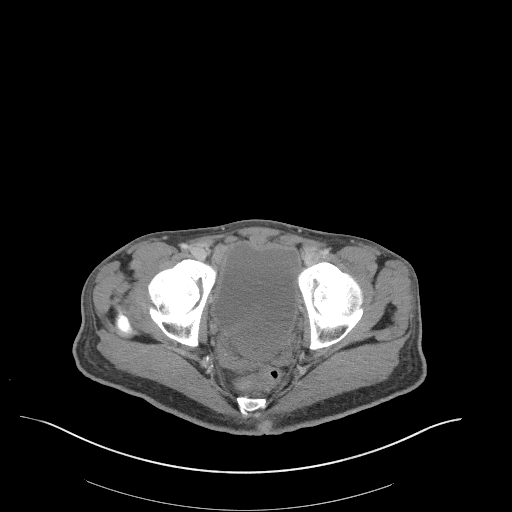
[im 35/96  soft-tissue]
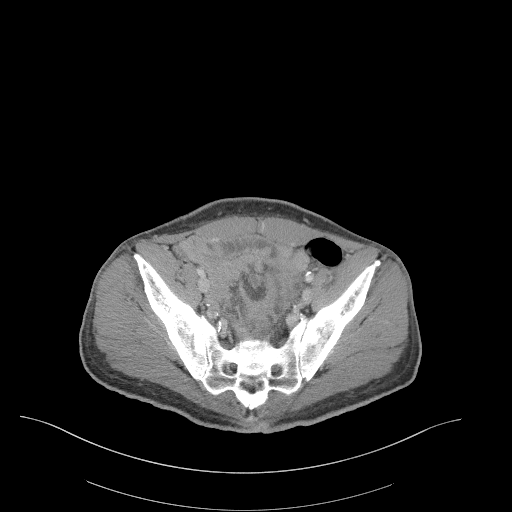
[im 39/96  soft-tissue]
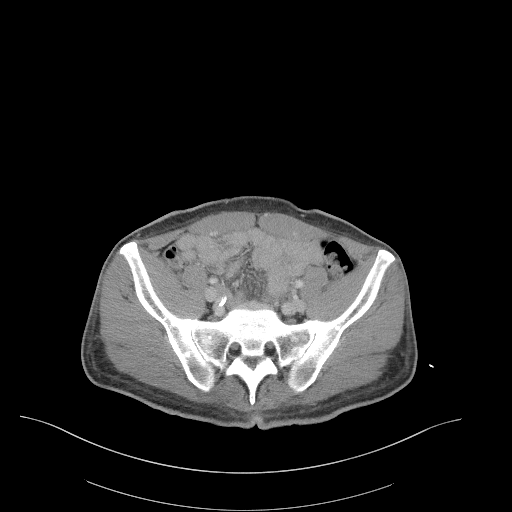
[im 48/96  soft-tissue]
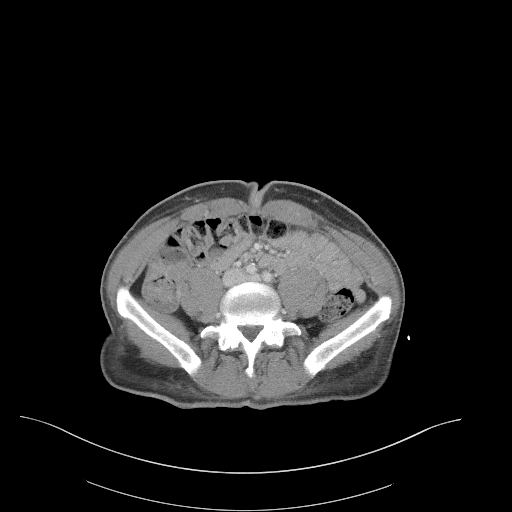
[im 57/96  soft-tissue]
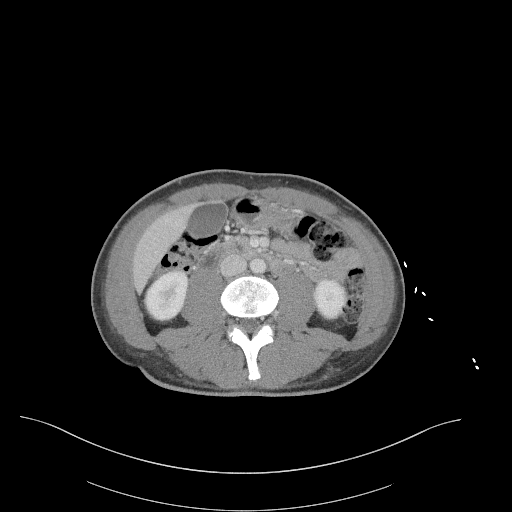
[im 61/96  soft-tissue]
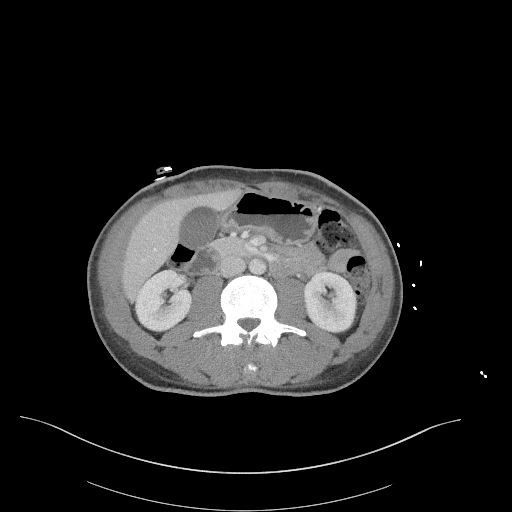
[im 61/96  bone]
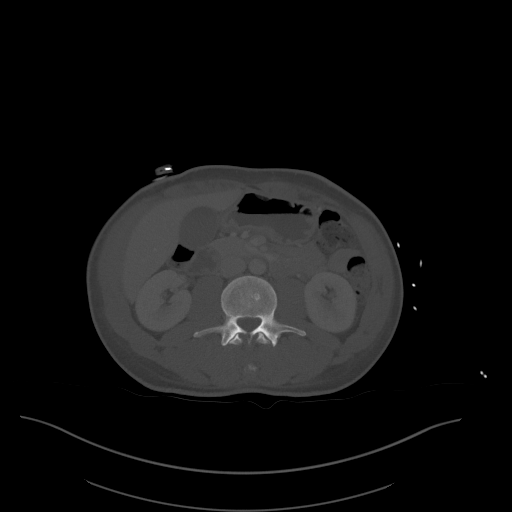
[im 70/96  soft-tissue]
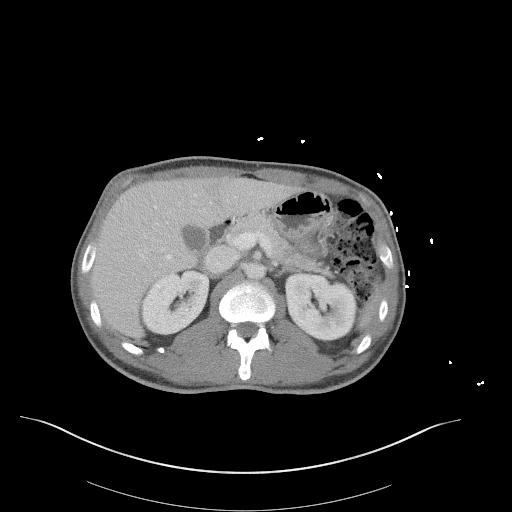
[im 74/96  soft-tissue]
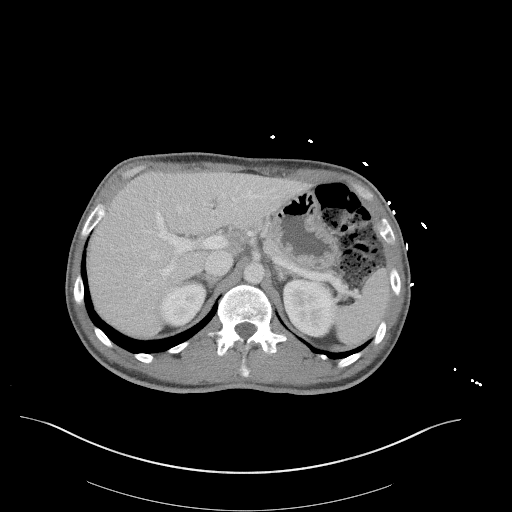
[im 83/96  soft-tissue]
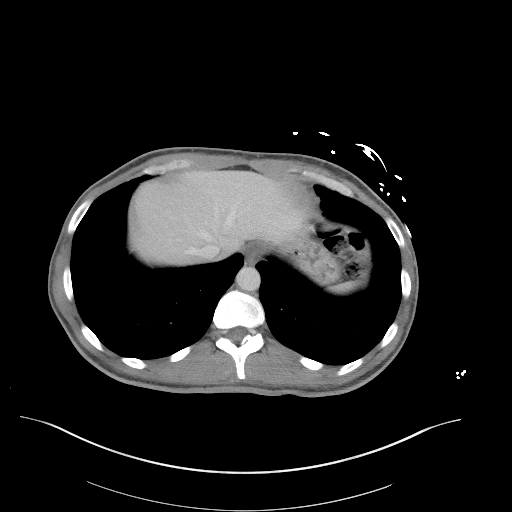
[im 91/96  soft-tissue]
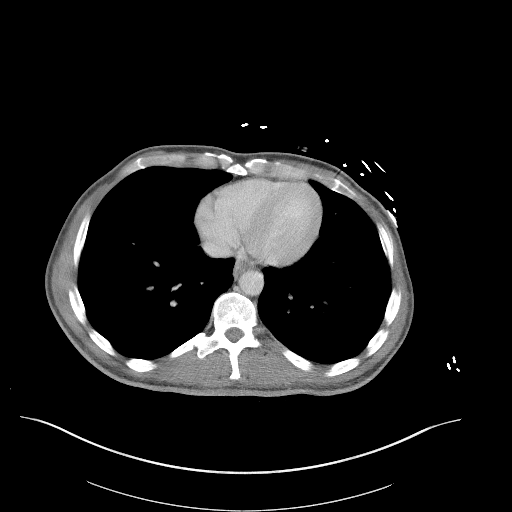

[Series 5: coronal st · coronal · 0.83mm/px · 3 of 106 slices shown]
[im 36/106  soft-tissue]
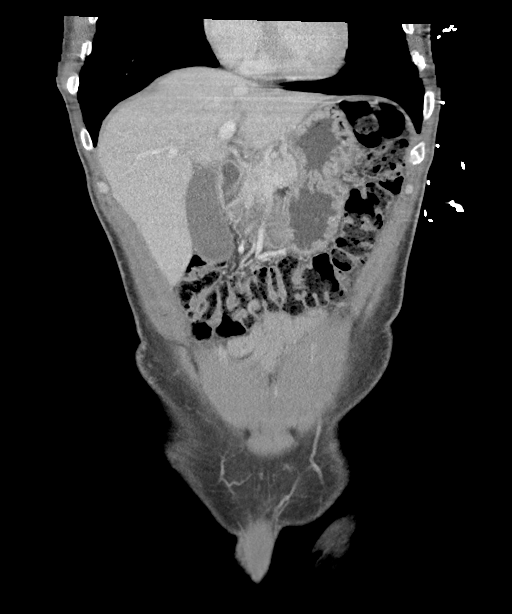
[im 47/106  soft-tissue]
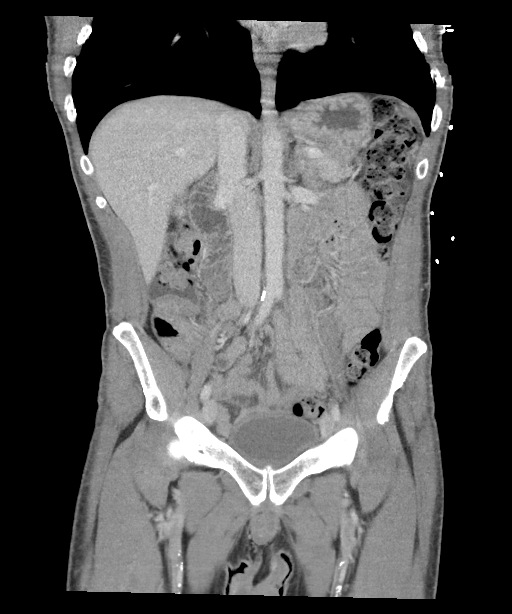
[im 59/106  soft-tissue]
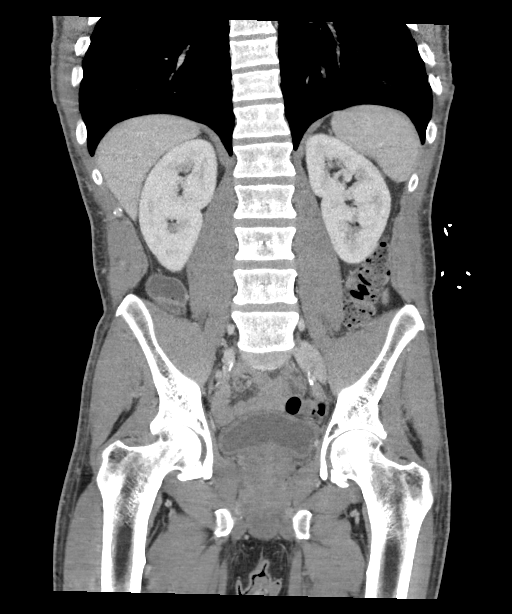

[16 of 46 positions shown; findings below may reference images not displayed]

FINDINGS: Lower chest: The lung bases are clear of acute process. No pleural
effusion or pulmonary lesions. The heart is normal in size. No
pericardial effusion. The distal esophagus and aorta are
unremarkable.

Hepatobiliary: No focal hepatic lesions or intrahepatic biliary
dilatation. The gallbladder is normal. No common bile duct
dilatation.

Pancreas: No mass, inflammation or ductal dilatation.

Spleen: Normal size.  No focal lesions are

Adrenals/Urinary Tract: Adrenal glands and kidneys are unremarkable.
No renal lesions or hydronephrosis. The bladder is unremarkable.

Stomach/Bowel: The stomach, duodenum, small bowel and colon are
grossly normal without oral contrast. No inflammatory changes, mass
lesions or obstructive findings. The appendix is normal.

Vascular/Lymphatic: Age advanced atherosclerotic calcifications
involving the distal aorta and iliac arteries. No aneurysm or
dissection. The major venous structures are patent. No mesenteric or
retroperitoneal mass or adenopathy.

Reproductive: The prostate gland and seminal vesicles are
unremarkable.

Other: No pelvic mass or adenopathy. No free pelvic fluid
collections. No inguinal mass or adenopathy. No abdominal wall
hernia or subcutaneous lesions.

Musculoskeletal: No significant bony findings.
IMPRESSION: 1. No acute abdominal/pelvic findings, mass lesions or adenopathy.
2. Age advanced atherosclerotic calcifications involving the distal
aorta and iliac arteries.

Aortic Atherosclerosis ([id number]-[id number]).

## 2023-06-14 ENCOUNTER — Inpatient Hospital Stay (HOSPITAL_COMMUNITY)
Admission: EM | Admit: 2023-06-14 | Discharge: 2023-06-27 | DRG: 064 | Disposition: A | Discharge status: Rehabilitation Facility | Payer: Medicaid Other | Attending: Internal Medicine | Admitting: Internal Medicine

## 2023-06-14 ENCOUNTER — Inpatient Hospital Stay (HOSPITAL_COMMUNITY): Payer: Medicaid Other

## 2023-06-14 ENCOUNTER — Other Ambulatory Visit: Payer: Self-pay

## 2023-06-14 ENCOUNTER — Encounter (HOSPITAL_COMMUNITY): Payer: Self-pay | Admitting: Emergency Medicine

## 2023-06-14 ENCOUNTER — Emergency Department (HOSPITAL_COMMUNITY): Payer: Medicaid Other

## 2023-06-14 DIAGNOSIS — F1990 Other psychoactive substance use, unspecified, uncomplicated: Secondary | ICD-10-CM | POA: Diagnosis present

## 2023-06-14 DIAGNOSIS — D62 Acute posthemorrhagic anemia: Secondary | ICD-10-CM | POA: Diagnosis not present

## 2023-06-14 DIAGNOSIS — F17210 Nicotine dependence, cigarettes, uncomplicated: Secondary | ICD-10-CM | POA: Diagnosis present

## 2023-06-14 DIAGNOSIS — E1122 Type 2 diabetes mellitus with diabetic chronic kidney disease: Secondary | ICD-10-CM | POA: Diagnosis present

## 2023-06-14 DIAGNOSIS — R482 Apraxia: Secondary | ICD-10-CM

## 2023-06-14 DIAGNOSIS — I63512 Cerebral infarction due to unspecified occlusion or stenosis of left middle cerebral artery: Secondary | ICD-10-CM | POA: Diagnosis not present

## 2023-06-14 DIAGNOSIS — E119 Type 2 diabetes mellitus without complications: Secondary | ICD-10-CM | POA: Diagnosis not present

## 2023-06-14 DIAGNOSIS — Z794 Long term (current) use of insulin: Secondary | ICD-10-CM

## 2023-06-14 DIAGNOSIS — R29810 Facial weakness: Secondary | ICD-10-CM | POA: Diagnosis present

## 2023-06-14 DIAGNOSIS — E872 Acidosis, unspecified: Secondary | ICD-10-CM | POA: Diagnosis not present

## 2023-06-14 DIAGNOSIS — I2510 Atherosclerotic heart disease of native coronary artery without angina pectoris: Secondary | ICD-10-CM | POA: Diagnosis present

## 2023-06-14 DIAGNOSIS — K5900 Constipation, unspecified: Secondary | ICD-10-CM | POA: Diagnosis not present

## 2023-06-14 DIAGNOSIS — E559 Vitamin D deficiency, unspecified: Secondary | ICD-10-CM | POA: Diagnosis present

## 2023-06-14 DIAGNOSIS — E1142 Type 2 diabetes mellitus with diabetic polyneuropathy: Secondary | ICD-10-CM | POA: Diagnosis present

## 2023-06-14 DIAGNOSIS — I6389 Other cerebral infarction: Secondary | ICD-10-CM

## 2023-06-14 DIAGNOSIS — F17200 Nicotine dependence, unspecified, uncomplicated: Secondary | ICD-10-CM | POA: Diagnosis not present

## 2023-06-14 DIAGNOSIS — R509 Fever, unspecified: Secondary | ICD-10-CM | POA: Diagnosis not present

## 2023-06-14 DIAGNOSIS — R4701 Aphasia: Secondary | ICD-10-CM | POA: Diagnosis present

## 2023-06-14 DIAGNOSIS — N179 Acute kidney failure, unspecified: Secondary | ICD-10-CM | POA: Diagnosis not present

## 2023-06-14 DIAGNOSIS — Z7984 Long term (current) use of oral hypoglycemic drugs: Secondary | ICD-10-CM | POA: Diagnosis not present

## 2023-06-14 DIAGNOSIS — E1169 Type 2 diabetes mellitus with other specified complication: Secondary | ICD-10-CM | POA: Diagnosis present

## 2023-06-14 DIAGNOSIS — R29710 NIHSS score 10: Secondary | ICD-10-CM | POA: Diagnosis present

## 2023-06-14 DIAGNOSIS — R252 Cramp and spasm: Secondary | ICD-10-CM | POA: Diagnosis not present

## 2023-06-14 DIAGNOSIS — E860 Dehydration: Secondary | ICD-10-CM | POA: Diagnosis not present

## 2023-06-14 DIAGNOSIS — R1312 Dysphagia, oropharyngeal phase: Secondary | ICD-10-CM | POA: Diagnosis not present

## 2023-06-14 DIAGNOSIS — I618 Other nontraumatic intracerebral hemorrhage: Secondary | ICD-10-CM | POA: Diagnosis present

## 2023-06-14 DIAGNOSIS — E876 Hypokalemia: Secondary | ICD-10-CM | POA: Diagnosis present

## 2023-06-14 DIAGNOSIS — I10 Essential (primary) hypertension: Secondary | ICD-10-CM | POA: Diagnosis not present

## 2023-06-14 DIAGNOSIS — I639 Cerebral infarction, unspecified: Secondary | ICD-10-CM | POA: Diagnosis present

## 2023-06-14 DIAGNOSIS — R490 Dysphonia: Secondary | ICD-10-CM | POA: Diagnosis present

## 2023-06-14 DIAGNOSIS — I129 Hypertensive chronic kidney disease with stage 1 through stage 4 chronic kidney disease, or unspecified chronic kidney disease: Secondary | ICD-10-CM | POA: Diagnosis not present

## 2023-06-14 DIAGNOSIS — I69320 Aphasia following cerebral infarction: Secondary | ICD-10-CM | POA: Diagnosis not present

## 2023-06-14 DIAGNOSIS — E1159 Type 2 diabetes mellitus with other circulatory complications: Secondary | ICD-10-CM | POA: Diagnosis not present

## 2023-06-14 DIAGNOSIS — E1042 Type 1 diabetes mellitus with diabetic polyneuropathy: Secondary | ICD-10-CM | POA: Diagnosis not present

## 2023-06-14 DIAGNOSIS — I69359 Hemiplegia and hemiparesis following cerebral infarction affecting unspecified side: Secondary | ICD-10-CM | POA: Diagnosis not present

## 2023-06-14 DIAGNOSIS — Z8673 Personal history of transient ischemic attack (TIA), and cerebral infarction without residual deficits: Secondary | ICD-10-CM

## 2023-06-14 DIAGNOSIS — R0981 Nasal congestion: Secondary | ICD-10-CM | POA: Diagnosis not present

## 2023-06-14 DIAGNOSIS — R1311 Dysphagia, oral phase: Secondary | ICD-10-CM | POA: Diagnosis not present

## 2023-06-14 DIAGNOSIS — E10649 Type 1 diabetes mellitus with hypoglycemia without coma: Secondary | ICD-10-CM | POA: Diagnosis not present

## 2023-06-14 DIAGNOSIS — E1165 Type 2 diabetes mellitus with hyperglycemia: Secondary | ICD-10-CM | POA: Diagnosis present

## 2023-06-14 DIAGNOSIS — G8191 Hemiplegia, unspecified affecting right dominant side: Secondary | ICD-10-CM | POA: Diagnosis present

## 2023-06-14 DIAGNOSIS — I69351 Hemiplegia and hemiparesis following cerebral infarction affecting right dominant side: Secondary | ICD-10-CM | POA: Diagnosis not present

## 2023-06-14 DIAGNOSIS — E1140 Type 2 diabetes mellitus with diabetic neuropathy, unspecified: Secondary | ICD-10-CM | POA: Diagnosis present

## 2023-06-14 DIAGNOSIS — I69391 Dysphagia following cerebral infarction: Secondary | ICD-10-CM | POA: Diagnosis not present

## 2023-06-14 DIAGNOSIS — R1313 Dysphagia, pharyngeal phase: Secondary | ICD-10-CM | POA: Diagnosis not present

## 2023-06-14 DIAGNOSIS — Z880 Allergy status to penicillin: Secondary | ICD-10-CM

## 2023-06-14 DIAGNOSIS — I152 Hypertension secondary to endocrine disorders: Secondary | ICD-10-CM | POA: Diagnosis not present

## 2023-06-14 DIAGNOSIS — G8111 Spastic hemiplegia affecting right dominant side: Secondary | ICD-10-CM | POA: Diagnosis not present

## 2023-06-14 DIAGNOSIS — Z79899 Other long term (current) drug therapy: Secondary | ICD-10-CM

## 2023-06-14 DIAGNOSIS — I63412 Cerebral infarction due to embolism of left middle cerebral artery: Principal | ICD-10-CM | POA: Diagnosis present

## 2023-06-14 DIAGNOSIS — Z888 Allergy status to other drugs, medicaments and biological substances status: Secondary | ICD-10-CM

## 2023-06-14 DIAGNOSIS — I361 Nonrheumatic tricuspid (valve) insufficiency: Secondary | ICD-10-CM | POA: Diagnosis not present

## 2023-06-14 DIAGNOSIS — Z91199 Patient's noncompliance with other medical treatment and regimen due to unspecified reason: Secondary | ICD-10-CM

## 2023-06-14 DIAGNOSIS — I679 Cerebrovascular disease, unspecified: Secondary | ICD-10-CM | POA: Diagnosis not present

## 2023-06-14 DIAGNOSIS — E1069 Type 1 diabetes mellitus with other specified complication: Secondary | ICD-10-CM | POA: Diagnosis not present

## 2023-06-14 DIAGNOSIS — E785 Hyperlipidemia, unspecified: Secondary | ICD-10-CM | POA: Diagnosis present

## 2023-06-14 DIAGNOSIS — Z781 Physical restraint status: Secondary | ICD-10-CM

## 2023-06-14 DIAGNOSIS — A310 Pulmonary mycobacterial infection: Secondary | ICD-10-CM

## 2023-06-14 LAB — LIPID PANEL
Cholesterol: 352 mg/dL — ABNORMAL HIGH (ref 0–200)
HDL: 46 mg/dL (ref >40)
LDL Cholesterol: 265 mg/dL — ABNORMAL HIGH (ref 0–99)
Total CHOL/HDL Ratio: 7.7 RATIO
Triglycerides: 205 mg/dL — ABNORMAL HIGH (ref <150)
VLDL: 41 mg/dL — ABNORMAL HIGH (ref 0–40)

## 2023-06-14 LAB — DIFFERENTIAL
Abs Immature Granulocytes: 0.02 10*3/uL (ref 0.00–0.07)
Basophils Absolute: 0 10*3/uL (ref 0.0–0.1)
Basophils Relative: 0 %
Eosinophils Absolute: 0.1 10*3/uL (ref 0.0–0.5)
Eosinophils Relative: 1 %
Immature Granulocytes: 0 %
Lymphocytes Relative: 19 %
Lymphs Abs: 1.9 10*3/uL (ref 0.7–4.0)
Monocytes Absolute: 0.4 10*3/uL (ref 0.1–1.0)
Monocytes Relative: 4 %
Neutro Abs: 7.5 10*3/uL (ref 1.7–7.7)
Neutrophils Relative %: 76 %

## 2023-06-14 LAB — ECHOCARDIOGRAM COMPLETE
AR max vel: 1.45 cm2
AV Area VTI: 1.55 cm2
AV Area mean vel: 1.67 cm2
AV Mean grad: 5 mmHg
AV Peak grad: 12.7 mmHg
Ao pk vel: 1.78 m/s
Area-P 1/2: 3.37 cm2
Height: 73 in
S' Lateral: 3.2 cm
Weight: 2398.6 oz

## 2023-06-14 LAB — CBC
HCT: 40.2 % (ref 39.0–52.0)
Hemoglobin: 13.1 g/dL (ref 13.0–17.0)
MCH: 29.7 pg (ref 26.0–34.0)
MCHC: 32.6 g/dL (ref 30.0–36.0)
MCV: 91.2 fL (ref 80.0–100.0)
Platelets: 278 10*3/uL (ref 150–400)
RBC: 4.41 MIL/uL (ref 4.22–5.81)
RDW: 13.1 % (ref 11.5–15.5)
WBC: 10 10*3/uL (ref 4.0–10.5)
nRBC: 0 % (ref 0.0–0.2)

## 2023-06-14 LAB — COMPREHENSIVE METABOLIC PANEL
ALT: 16 U/L (ref 0–44)
AST: 18 U/L (ref 15–41)
Albumin: 3.5 g/dL (ref 3.5–5.0)
Alkaline Phosphatase: 116 U/L (ref 38–126)
Anion gap: 10 (ref 5–15)
BUN: 19 mg/dL (ref 6–20)
CO2: 27 mmol/L (ref 22–32)
Calcium: 9.2 mg/dL (ref 8.9–10.3)
Chloride: 106 mmol/L (ref 98–111)
Creatinine, Ser: 1.57 mg/dL — ABNORMAL HIGH (ref 0.61–1.24)
GFR, Estimated: 57 mL/min — ABNORMAL LOW (ref >60)
Glucose, Bld: 108 mg/dL — ABNORMAL HIGH (ref 70–99)
Potassium: 3.4 mmol/L — ABNORMAL LOW (ref 3.5–5.1)
Sodium: 143 mmol/L (ref 135–145)
Total Bilirubin: 0.8 mg/dL (ref 0.3–1.2)
Total Protein: 8 g/dL (ref 6.5–8.1)

## 2023-06-14 LAB — APTT: aPTT: 26 seconds (ref 24–36)

## 2023-06-14 LAB — GLUCOSE, CAPILLARY: Glucose-Capillary: 189 mg/dL — ABNORMAL HIGH (ref 70–99)

## 2023-06-14 LAB — HEMOGLOBIN A1C
Hgb A1c MFr Bld: 11.7 % — ABNORMAL HIGH (ref 4.8–5.6)
Mean Plasma Glucose: 289.09 mg/dL

## 2023-06-14 LAB — ETHANOL: Alcohol, Ethyl (B): <10 mg/dL (ref <10)

## 2023-06-14 LAB — PROTIME-INR
INR: 1 (ref 0.8–1.2)
Prothrombin Time: 13.4 seconds (ref 11.4–15.2)

## 2023-06-14 LAB — TSH: TSH: 0.766 u[IU]/mL (ref 0.350–4.500)

## 2023-06-14 LAB — CBG MONITORING, ED: Glucose-Capillary: 109 mg/dL — ABNORMAL HIGH (ref 70–99)

## 2023-06-14 MED ORDER — ASPIRIN 300 MG RE SUPP
300.0000 mg | Freq: Every day | RECTAL | Status: DC
  Administered 2023-06-14: 300 mg via RECTAL
  Filled 2023-06-14: qty 1

## 2023-06-14 MED ORDER — NICOTINE 21 MG/24HR TD PT24: 21.0000 mg | MEDICATED_PATCH | Freq: Every day | TRANSDERMAL | Status: DC | PRN

## 2023-06-14 MED ORDER — ACETAMINOPHEN 160 MG/5ML PO SOLN
650.0000 mg | ORAL | Status: DC | PRN
  Administered 2023-06-26: 650 mg
  Filled 2023-06-14: qty 20.3

## 2023-06-14 MED ORDER — LACTATED RINGERS IV BOLUS
500.0000 mL | Freq: Once | INTRAVENOUS | Status: AC
  Administered 2023-06-14: 500 mL via INTRAVENOUS

## 2023-06-14 MED ORDER — ASPIRIN 325 MG PO TABS: 325.0000 mg | ORAL_TABLET | Freq: Every day | ORAL | Status: DC

## 2023-06-14 MED ORDER — PERFLUTREN LIPID MICROSPHERE
1.0000 mL | INTRAVENOUS | Status: AC | PRN
  Administered 2023-06-14: 3 mL via INTRAVENOUS

## 2023-06-14 MED ORDER — POTASSIUM CHLORIDE 10 MEQ/100ML IV SOLN
10.0000 meq | INTRAVENOUS | Status: AC
  Administered 2023-06-14 (×3): 10 meq via INTRAVENOUS
  Filled 2023-06-14: qty 100

## 2023-06-14 MED ORDER — ACETAMINOPHEN 325 MG PO TABS
650.0000 mg | ORAL_TABLET | ORAL | Status: DC | PRN
  Administered 2023-06-15: 650 mg via ORAL
  Filled 2023-06-14 (×3): qty 2

## 2023-06-14 MED ORDER — SENNOSIDES-DOCUSATE SODIUM 8.6-50 MG PO TABS
1.0000 | ORAL_TABLET | Freq: Every evening | ORAL | Status: DC | PRN
  Administered 2023-06-21: 1 via ORAL
  Filled 2023-06-14: qty 1

## 2023-06-14 MED ORDER — HYDRALAZINE HCL 20 MG/ML IJ SOLN: 5.0000 mg | INTRAMUSCULAR | Status: DC | PRN

## 2023-06-14 MED ORDER — ACETAMINOPHEN 650 MG RE SUPP: 650.0000 mg | RECTAL | Status: DC | PRN

## 2023-06-14 MED ORDER — STROKE: EARLY STAGES OF RECOVERY BOOK
Freq: Once | Status: AC
  Filled 2023-06-14: qty 1

## 2023-06-14 MED ORDER — INSULIN ASPART 100 UNIT/ML IJ SOLN
0.0000 [IU] | INTRAMUSCULAR | Status: DC
  Administered 2023-06-14 – 2023-06-15 (×3): 2 [IU] via SUBCUTANEOUS

## 2023-06-14 MED ORDER — ASPIRIN 81 MG PO TBEC
81.0000 mg | DELAYED_RELEASE_TABLET | Freq: Every day | ORAL | Status: DC
  Administered 2023-06-15 – 2023-06-19 (×4): 81 mg via ORAL
  Filled 2023-06-14 (×5): qty 1

## 2023-06-14 MED ORDER — ATORVASTATIN CALCIUM 80 MG PO TABS
80.0000 mg | ORAL_TABLET | Freq: Every evening | ORAL | Status: DC
  Administered 2023-06-14 – 2023-06-21 (×6): 80 mg via ORAL
  Filled 2023-06-14 (×5): qty 2
  Filled 2023-06-14: qty 1
  Filled 2023-06-14: qty 2

## 2023-06-14 MED ORDER — CLOPIDOGREL BISULFATE 75 MG PO TABS
75.0000 mg | ORAL_TABLET | Freq: Every day | ORAL | Status: DC
  Administered 2023-06-15 – 2023-06-21 (×6): 75 mg via ORAL
  Filled 2023-06-14 (×6): qty 1

## 2023-06-14 MED ORDER — ENOXAPARIN SODIUM 40 MG/0.4ML IJ SOSY
40.0000 mg | PREFILLED_SYRINGE | Freq: Every day | INTRAMUSCULAR | Status: DC
  Administered 2023-06-14 – 2023-06-27 (×14): 40 mg via SUBCUTANEOUS
  Filled 2023-06-14 (×14): qty 0.4

## 2023-06-14 MED ORDER — SODIUM CHLORIDE 0.9 % IV SOLN: INTRAVENOUS | Status: DC

## 2023-06-14 NOTE — Hospital Course (Addendum)
42 year old gentleman with poorly controlled diabetes mellitus on insulin, chronic daily smoker, hypertension, history of marijuana use, diabetic peripheral neuropathy, hyperlipidemia was sent to the emergency department by [facility] with right-sided weakness right-sided facial droop and difficulty speaking.  He was last known to be well 2 days ago at 6 AM when his sister noticed he went out to smoke on ring camera.  He apparently lives with his sister.  Sister noticed the patient lying on the floor having a difficult time communicating only able to nod yes and no.  She noticed that for the last 2 days he has been lying around and sleeping. In ED: had significant aphasia and right-sided hemiparesis.  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  His blood glucose was 109.  Code Stroke was not activated due to being 48 hours since last known well time.  06/14/23:Admitted at [facility]> MRI brain:acute to subacute perforator infarct at the left basal ganglia and adjacent white matter with petechial hemorrhage. 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 Subsequently underwent TTE, TEE that was negative for hyper coagulable labs ordered seen by neuro PT OT speech, CT chest abdomen pelvis unremarkable and occult malignancy. 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 New/interval small acute infarct in the right posterior limb of the internal capsule> neurology was notified advised to transfer to [facility] for further neurology management. 06/20/23 night patient arrived at [facility] 06/21/23;Cortrak ordered>  6/28:placed on DYS 1 diet after MBS

## 2023-06-14 NOTE — ED Triage Notes (Signed)
Pt arrived via [facility] with R sided weakness, R sided facial droop, and difficulty sleeping. Last seen well Monday at 6am per family. Hx of diabetes MD at bedside

## 2023-06-14 NOTE — ED Notes (Signed)
Patient transported to CT 

## 2023-06-14 NOTE — ED Notes (Signed)
Cbg 109

## 2023-06-14 NOTE — Evaluation (Signed)
Clinical/Bedside Swallow Evaluation Patient Details  Name: Zachary Winters MRN: [id number] Date of Birth: April 06, 1981  Today's Date: 06/14/2023 Time: SLP Start Time (ACUTE ONLY): 1647 SLP Stop Time (ACUTE ONLY): 1721 SLP Time Calculation (min) (ACUTE ONLY): 34 min  Past Medical History:  Past Medical History:  Diagnosis Date   Diabetes mellitus without complication (HCC)    Past Surgical History: History reviewed. No pertinent surgical history. HPI:  Zachary Winters is a 42 year old gentleman with poorly controlled diabetes mellitus on insulin, chronic daily smoker, hypertension, history of marijuana use, diabetic peripheral neuropathy, hyperlipidemia was sent to the emergency department by [facility] with right-sided weakness right-sided facial droop and difficulty speaking.  He was last known to be well 2 days ago at 6 AM when his sister noticed he went out to smoke on [place].  He apparently lives with his sister.  Sister noticed the patient lying on the floor having a difficult time communicating only able to nod yes and no.  She noticed that for the last 2 days he has been lying around and sleeping. He was seen in the ED and noted to have a significant aphasia and right-sided hemiparesis.  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    Assessment / Plan / Recommendation  Clinical Impression  Clinical swallowing evaluation completed while Pt was sitting upright in bed. Pt was cooperative but unable to verbally respond to any questions; Pt did shake head and nod appropriately to answer all questions. Note R facial droop and decreased R ROM. Pt consumed thin liquids without overt s/sx of aspiration. Pt consumed puree textures with some minimal R anterior spillage; with regular textures note decreased coordination and R anterior spillage with alternating bites/sips of regular/thin.  Recommend initiate D2/fine chop diet with thin liquids; meds to be administered whole with puree. Acknowledge SLE and will complete this tomorrow as Pt is fatigued tonight and anticipate better participation in the morning. ST will continue to follow for dysphagia and will complete SLE tomorrow. Thank you for this referral, SLP Visit Diagnosis: Dysphagia, oropharyngeal phase (R13.12)    Aspiration Risk  Mild aspiration risk    Diet Recommendation Dysphagia 2 (Fine chop);Thin liquid   Liquid Administration via: Straw;Cup Medication Administration: Whole meds with liquid Supervision: Patient able to self feed Compensations: Slow rate;Small sips/bites;Follow solids with liquid Postural Changes: Seated upright at 90 degrees    Other  Recommendations Oral Care Recommendations: Oral care BID    Recommendations for follow up therapy are one component of a multi-disciplinary discharge planning process, led by the attending physician.  Recommendations may be updated based on patient status, additional functional criteria and insurance authorization.  Follow up Recommendations Acute inpatient rehab (3hours/day)            Frequency and Duration min 2x/week  1 week       Prognosis Prognosis for improved oropharyngeal function: Good      Swallow Study   General Date of Onset: 06/14/23 HPI: OMARE Winters is a 42 year old gentleman with poorly controlled diabetes mellitus on insulin, chronic daily smoker, hypertension, history of marijuana use, diabetic peripheral neuropathy, hyperlipidemia was sent to the emergency department by [facility] with right-sided weakness right-sided facial droop and difficulty speaking.  He was last known to be well 2 days ago at 6 AM when his sister noticed he went out to smoke on [place].  He apparently lives with his sister.  Sister noticed the patient lying on the floor having a difficult time communicating only able to nod yes and no.  She noticed that for the  last 2 days he has been lying around and sleeping. He was seen in the ED and noted to have a significant aphasia and right-sided hemiparesis.  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 Type of Study: Bedside Swallow Evaluation Previous Swallow Assessment: none in chart Diet Prior to this Study: NPO Temperature Spikes Noted: No Respiratory Status: Room air History of Recent Intubation: No Behavior/Cognition: Alert;Cooperative;Pleasant mood Oral Cavity Assessment: Within Functional Limits Oral Care Completed by SLP: Recent completion by staff Oral Cavity - Dentition: Adequate natural dentition Vision: Functional for self-feeding Self-Feeding Abilities: Able to feed self;Needs assist;Needs set up Patient Positioning: Upright in bed Baseline Vocal Quality: Normal Volitional Cough: Strong Volitional Swallow: Able to elicit    Oral/Motor/Sensory Function Overall Oral Motor/Sensory Function: Moderate impairment Facial ROM: Reduced right Facial Symmetry: Abnormal symmetry right Facial Strength: Reduced right Facial Sensation: Reduced right   Ice Chips Ice chips: Within functional limits   Thin Liquid Thin Liquid: Within functional limits    Nectar Thick Nectar Thick Liquid: Not tested   Honey Thick Honey Thick Liquid: Not tested   Puree Puree: Within functional limits Presentation: Spoon   Solid     Solid: Impaired Oral Phase Impairments: Reduced lingual movement/coordination;Reduced labial seal Oral Phase Functional Implications: Right anterior spillage      Grady Lucci H. Romie Levee, CCC-SLP Speech Language Pathologist   Georgetta Haber 06/14/2023,5:28 PM

## 2023-06-14 NOTE — Progress Notes (Signed)
06/14/2023 4:32 PM  Updated sister, grandmother by telephone  Maryln Manuel MD

## 2023-06-14 NOTE — H&P (Signed)
History and Physical  [facility]  [name] UJW:[id number] DOB: 10-15-1981 DOA: 06/14/2023  PCP: System, Provider Not In  Patient coming from: Home by [facility]  Level of care: Telemetry  I have personally briefly reviewed patient's old medical records in [facility]  Chief Complaint: aphasia  HPI: Zachary Winters is a 42 year old gentleman with poorly controlled diabetes mellitus on insulin, chronic daily smoker, hypertension, history of marijuana use, diabetic peripheral neuropathy, hyperlipidemia was sent to the emergency department by [facility] with right-sided weakness right-sided facial droop and difficulty speaking.  He was last known to be well 2 days ago at 6 AM when his sister noticed he went out to smoke on ring camera.  He apparently lives with his sister.  Sister noticed the patient lying on the floor having a difficult time communicating only able to nod yes and no.  She noticed that for the last 2 days he has been lying around and sleeping.  He was seen in the ED and noted to have a significant aphasia and right-sided hemiparesis.  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  His blood glucose was 109.  Code Stroke was not activated due to being 48 hours since last known well time.  Pt is being admitted into the hospital for further work up and treatment of acute CVA.      Past Medical History:  Diagnosis Date   Diabetes mellitus without complication (HCC)     History reviewed. No pertinent surgical history.   reports that he has been smoking cigarettes. He has a 5.00 pack-year smoking history. He has never used smokeless tobacco. He reports current alcohol use. He reports current drug use. Frequency: 2.00 times per week. Drug: Marijuana.  Allergies  Allergen Reactions   Metformin Nausea And Vomiting   Penicillins Swelling    History reviewed. No  pertinent family history.  Prior to Admission medications   Medication Sig Start Date End Date Taking? Authorizing Provider  insulin glargine (LANTUS) 100 UNIT/ML injection Inject 30 Units into the skin daily.   Yes [provider]  atorvastatin (LIPITOR) 10 MG tablet Take 1 tablet (10 mg total) by mouth daily. Patient not taking: Reported on 04/20/2021 01/01/20   [name], NP  blood glucose meter kit and supplies Dispense based on patient and insurance preference. Check blood sugar four times daily as directed. (FOR ICD-10 E10.9, E11.9). 01/01/20   [name], NP  cyclobenzaprine (FLEXERIL) 10 MG tablet Take 1 tablet (10 mg total) by mouth 2 (two) times daily as needed for muscle spasms. Patient not taking: Reported on 04/20/2021 10/16/19   Alvira Monday, MD  gabapentin (NEURONTIN) 100 MG capsule Take 1 capsule (100 mg total) by mouth at bedtime. Patient not taking: Reported on 04/20/2021 01/01/20   [name], NP  HUMULIN 70/30 (70-30) 100 UNIT/ML injection Inject 18 Units into the skin 2 (two) times daily with a meal. Patient not taking: Reported on 06/14/2023 03/15/21   Vassie Loll, MD  Insulin Syringe-Needle U-100 [facility] INSULIN SYRINGES 31GX5/16") 31G X 5/16" 0.3 ML MISC Use as instructed to inject insulin twice a day. 03/15/21   Vassie Loll, MD  lisinopril (ZESTRIL) 10 MG tablet Take 1 tablet (10 mg total) by mouth daily. Patient not taking: Reported on 06/14/2023 03/15/21   Vassie Loll, MD  ondansetron (ZOFRAN ODT) 4 MG disintegrating tablet Take 1  tablet (4 mg total) by mouth every 8 (eight) hours as needed for nausea or vomiting. Patient not taking: Reported on 06/14/2023 05/20/21   Burgess Amor, PA-C  ondansetron (ZOFRAN) 4 MG tablet Take 1 tablet (4 mg total) by mouth every 6 (six) hours. Patient not taking: Reported on 06/14/2023 04/18/21   Triplett, Tammy, PA-C  pantoprazole (PROTONIX) 20 MG tablet Take 1 tablet (20 mg total) by mouth daily. Patient not taking:  Reported on 06/14/2023 07/31/21   Bethann Berkshire, MD  potassium chloride SA (KLOR-CON) 20 MEQ tablet Take 1 tablet (20 mEq total) by mouth 2 (two) times daily for 7 days. Patient not taking: Reported on 06/14/2023 05/20/21 05/27/21  Burgess Amor, PA-C  promethazine (PHENERGAN) 25 MG suppository Place 1 suppository (25 mg total) rectally every 6 (six) hours as needed for nausea or vomiting. Patient not taking: Reported on 06/14/2023 07/31/21   Bethann Berkshire, MD    Physical Exam: Vitals:   06/14/23 0848 06/14/23 0930 06/14/23 1015 06/14/23 1030  BP: (!) 177/106 (!) 166/114 (!) 170/105 (!) 164/112  Pulse: 84 83 77 75  Resp: 15 17 15 16   Temp: 98.8 F (37.1 C)  98.7 F (37.1 C)   TempSrc: Oral  Oral   SpO2: 99% 100% 98% 100%  Weight:      Height:        Constitutional: lying supine on gurnee, aphasic but alert and able to nod and blink, he nods that he is understanding the questions.  He is cooperative for the exam.  NAD, calm, comfortable Eyes: PERRL, lids and conjunctivae normal ENMT: Mucous membranes are dry. Posterior pharynx clear of any exudate or lesions.Normal dentition.  Neck: normal, supple, no masses, no thyromegaly Respiratory: clear to auscultation bilaterally, no wheezing, no crackles. Normal respiratory effort. No accessory muscle use.  Cardiovascular: normal s1, s2 sounds, no murmurs / rubs / gallops. No extremity edema. 2+ pedal pulses. No carotid bruits.  Abdomen: no tenderness, no masses palpated. No hepatosplenomegaly. Bowel sounds positive.  Musculoskeletal: no clubbing / cyanosis. No joint deformity upper and lower extremities. Good ROM, no contractures.   Skin: no rashes, lesions, ulcers. No induration Neurologic: dense right hemiparesis upper and lower, mild reduced strength in LLE.    Psychiatric: judgment and insight unable to determine. Alert and oriented x 3. Flat affect.   Labs on Admission: I have personally reviewed following labs and imaging studies  CBC: Recent  Labs  Lab 06/14/23 0911  WBC 10.0  NEUTROABS 7.5  HGB 13.1  HCT 40.2  MCV 91.2  PLT 278   Basic Metabolic Panel: Recent Labs  Lab 06/14/23 0911  NA 143  K 3.4*  CL 106  CO2 27  GLUCOSE 108*  BUN 19  CREATININE 1.57*  CALCIUM 9.2   GFR: Estimated Creatinine Clearance: 60.2 mL/min (A) (by C-G formula based on SCr of 1.57 mg/dL (H)). Liver Function Tests: Recent Labs  Lab 06/14/23 0911  AST 18  ALT 16  ALKPHOS 116  BILITOT 0.8  PROT 8.0  ALBUMIN 3.5   No results for input(s): "LIPASE", "AMYLASE" in the last 168 hours. No results for input(s): "AMMONIA" in the last 168 hours. Coagulation Profile: Recent Labs  Lab 06/14/23 0911  INR 1.0   Cardiac Enzymes: No results for input(s): "CKTOTAL", "CKMB", "CKMBINDEX", "TROPONINI" in the last 168 hours. BNP (last 3 results) No results for input(s): "PROBNP" in the last 8760 hours. HbA1C: No results for input(s): "HGBA1C" in the last 72 hours. CBG: Recent Labs  Lab 06/14/23 0849  GLUCAP 109*   Lipid Profile: Recent Labs    06/14/23 0911  CHOL 352*  HDL 46  LDLCALC 265*  TRIG 205*  CHOLHDL 7.7   Thyroid Function Tests: Recent Labs    06/14/23 0924  TSH 0.766   Anemia Panel: No results for input(s): "VITAMINB12", "FOLATE", "FERRITIN", "TIBC", "IRON", "RETICCTPCT" in the last 72 hours. Urine analysis:    Component Value Date/Time   COLORURINE YELLOW 05/20/2021 1326   APPEARANCEUR CLEAR 05/20/2021 1326   LABSPEC 1.013 05/20/2021 1326   PHURINE 9.0 (H) 05/20/2021 1326   GLUCOSEU >=500 (A) 05/20/2021 1326   HGBUR SMALL (A) 05/20/2021 1326   BILIRUBINUR NEGATIVE 05/20/2021 1326   KETONESUR 5 (A) 05/20/2021 1326   PROTEINUR 100 (A) 05/20/2021 1326   NITRITE NEGATIVE 05/20/2021 1326   LEUKOCYTESUR NEGATIVE 05/20/2021 1326   Radiological Exams on Admission: CT HEAD WO CONTRAST  Result Date: 06/14/2023 CLINICAL DATA:  Right-sided weakness and facial droop. EXAM: CT HEAD WITHOUT CONTRAST TECHNIQUE:  Contiguous axial images were obtained from the base of the skull through the vertex without intravenous contrast. RADIATION DOSE REDUCTION: This exam was performed according to the departmental dose-optimization program which includes automated exposure control, adjustment of the mA and/or kV according to patient size and/or use of iterative reconstruction technique. COMPARISON:  CT head 03/14/2021. FINDINGS: Brain: There is hypodensity in the left internal capsule, lentiform nucleus, external capsule, and corona radiata consistent with subacute infarct. There is likely petechial hemorrhage within the infarct (2-17). There is mild edema with partial effacement of the body of the left lateral ventricle but no midline shift. There is no other evidence of acute intracranial hemorrhage or extra-axial fluid collection. Background parenchymal volume is normal. The ventricles are otherwise normal in size. The pituitary and suprasellar region are normal. There is no solid mass lesion. Vascular: There is calcification of the bilateral carotid siphons. Skull: Normal. Negative for fracture or focal lesion. Sinuses/Orbits: The paranasal sinuses are clear. There is a remote left orbital floor fracture. The globes and orbits are otherwise unremarkable. Other: The mastoid air cells and middle ear cavities are clear. IMPRESSION: Moderate-sized subacute infarct in the left basal ganglia/corona radiata with probable petechial hemorrhage. Mild regional mass effect but no midline shift. Critical Value/emergent results were called by telephone at the time of interpretation on 06/14/2023 at 9:53 am to provider Pricilla Loveless , who verbally acknowledged these results. Electronically Signed   By: Lesia Hausen M.D.   On: 06/14/2023 09:53    EKG: Independently reviewed. NSR   Assessment/Plan Principal Problem:   Acute stroke due to ischemia [facility]) Active Problems:   Hypertension associated with diabetes (HCC)   Type 2 diabetes  mellitus with diabetic neuropathy (HCC)   Hyperlipidemia associated with type 2 diabetes mellitus (HCC)   Dysphonia   Aphasia   Tobacco use disorder   Recreational drug use   Diabetic neuropathy (HCC)   Acute CVA of left basal ganglia  -Pt presents with a dense right hemiparesis and aphasia -MRI brain ordered  -admit for full stroke work up -aspirin rectal ordered for now -2D echocardiogram and carotid dopplers ordered -PT/OT/SLP evaluation ordered -follow up lipid panel  -NPO for now -aspiration precautions -IV normal saline  -permissive hypertension pending neuro consult -inpatient neurology consultation requested   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 -While pt is NPO, we will monitor CBG every 4 hours and provide SSI coverage as needed -try to avoid hypoglycemia -follow up A1c  -follow up  TSH and vitamin D  Essential Hypertension - will allow for permissive hypertension in setting of acute CVA - when ok with neurologist will work to normalize BP as able  Hyperlipidemia - follow up lipid panel  - resume atorvastatin when able to take p.o. again - at this time pt is NPO   Tobacco Abuser - Pt needs to absolutely stop all tobacco use - nicotine patch ordered if needed - counseling on tobacco cessation ongoing  Recreational Drug use - per history, will check a urine toxicology screen to see if he has been using recently  Diabetic peripheral polyneuropathy - holding home oral medication until he is able to take p.o. again     DVT prophylaxis: enoxaparin   Code Status: full   Family Communication:   Disposition Plan: TBD   Consults called: neurology   Admission status: INP  Level of care: Telemetry Standley Dakins MD [facility] How to contact the [facility] Attending or Consulting provider 7A - 7P or covering provider during after hours 7P -7A, for this patient?  Check the care team in [facility] and look for a) attending/consulting [facility]  provider listed and b) the [facility] team listed Log into [email or web address] and use [place]'s universal password to access. If you do not have the password, please contact the hospital operator. Locate the [facility] provider you are looking for under [facility] and page to a number that you can be directly reached. If you still have difficulty reaching the provider, please page the [facility] (Director on Call) for the Hospitalists listed on amion for assistance.   If 7PM-7AM, please contact night-coverage [email or web address] Password [email or web address]  06/14/2023, 11:11 AM

## 2023-06-14 NOTE — Progress Notes (Signed)
*  PRELIMINARY RESULTS* Echocardiogram 2D Echocardiogram has been performed with Definity.  Stacey Drain 06/14/2023, 4:50 PM

## 2023-06-14 NOTE — Consult Note (Addendum)
I connected with  Zachary Winters on 06/14/23 by a video enabled telemedicine application and verified that I am speaking with the correct person using two identifiers.   I discussed the limitations of evaluation and management by telemedicine. The patient expressed understanding and agreed to proceed.  Location of patient: [facility] Location of physician: [facility]   Neurology Consultation Reason for Consult: Stroke Referring Physician: Dr. Standley Dakins  CC: Right-sided weakness  History is obtained from: Patient, chart review  HPI: Zachary Winters is a 42 y.o. male with past medical history of diabetes brought in with right-sided weakness.  Patient has trouble speaking.  Therefore history obtained from chart review.  Patient denies taking any antiplatelets.  Denies any drug use.  Denies any history of blood clots or prior strokes.  Last known normal: Monday 6/70/2024 around 6 AM No tPA as outside window No thrombectomy as outside window Event happened at home mRS 0  ROS: All other systems reviewed and negative except as noted in the HPI.   Past Medical History:  Diagnosis Date   Diabetes mellitus without complication (HCC)     History reviewed. No pertinent family history.  Social History:  reports that he has been smoking cigarettes. He has a 5.00 pack-year smoking history. He has never used smokeless tobacco. He reports current alcohol use. He reports current drug use. Frequency: 2.00 times per week. Drug: Marijuana.   Medications Prior to Admission  Medication Sig Dispense Refill Last Dose   insulin glargine (LANTUS) 100 UNIT/ML injection Inject 30 Units into the skin daily.   06/13/2023   atorvastatin (LIPITOR) 10 MG tablet Take 1 tablet (10 mg total) by mouth daily. (Patient not taking: Reported on 04/20/2021) 30 tablet 1 Not Taking   blood glucose meter kit and supplies Dispense based on patient and insurance preference. Check blood sugar four  times daily as directed. (FOR ICD-10 E10.9, E11.9). 1 each 0    Insulin Syringe-Needle U-100 (GNP INSULIN SYRINGES 31GX5/16") 31G X 5/16" 0.3 ML MISC Use as instructed to inject insulin twice a day. 300 each 3    potassium chloride SA (KLOR-CON) 20 MEQ tablet Take 1 tablet (20 mEq total) by mouth 2 (two) times daily for 7 days. (Patient not taking: Reported on 06/14/2023) 14 tablet 0 Not Taking      Exam: Current vital signs: BP (!) 180/92   Pulse 77   Temp 97.7 F (36.5 C)   Resp 16   Ht 6\' 1"  (1.854 m)   Wt 68 kg   SpO2 100%   BMI 19.78 kg/m  Vital signs in last 24 hours: Temp:  [97.7 F (36.5 C)-98.8 F (37.1 C)] 97.7 F (36.5 C) (06/19 1200) Pulse Rate:  [75-84] 77 (06/19 1200) Resp:  [15-17] 16 (06/19 1200) BP: (164-180)/(92-114) 180/92 (06/19 1200) SpO2:  [98 %-100 %] 100 % (06/19 1200) Weight:  [68 kg] 68 kg (06/19 0842)   Physical Exam  Constitutional: Appears well-developed and well-nourished.  Psych: Affect appropriate to situation Neuro: awake, alert, able to tell me his name but not where is, follows commands , able to name objects, dysarthric speech, right facial droop, rest of the CN appear intact, 0/5 in RUE, antigravity strength in RLE with drift but doesn't touch bed, antigravity strength in LUE/LLE without drift, decreased sensation to touch on right  NIHSS 10    INPUTS: 1A: Level of consciousness --> 0 = Alert; keenly responsive 1B: Ask month and age --> 0 =  Both questions right 1C: 'Blink eyes' & 'squeeze hands' --> 1 = Performs 1 task 2: Horizontal extraocular movements --> 0 = Normal 3: Visual fields --> 0 = No visual loss 4: Facial palsy --> 2 = Partial paralysis (lower face) 5A: Left arm motor drift --> 0 = No drift for 10 seconds 5B: Right arm motor drift --> 4 = No movement 6A: Left leg motor drift --> 0 = No drift for 5 seconds 6B: Right leg motor drift --> 1 = Drift, but doesn't hit bed 7: Limb Ataxia --> 0 = No ataxia 8: Sensation --> 1 =  Mild-moderate loss: less sharp/more dull  9: Language/aphasia --> 0 = Normal; no aphasia 10: Dysarthria --> 1 = Mild-moderate dysarthria: slurring but can be understood 11: Extinction/inattention --> 0 = No abnormality   I have reviewed labs in epic and the results pertinent to this consultation are: CBC:  Recent Labs  Lab 06/14/23 0911  WBC 10.0  NEUTROABS 7.5  HGB 13.1  HCT 40.2  MCV 91.2  PLT 278    Basic Metabolic Panel:  Lab Results  Component Value Date   NA 143 06/14/2023   K 3.4 (L) 06/14/2023   CO2 27 06/14/2023   GLUCOSE 108 (H) 06/14/2023   BUN 19 06/14/2023   CREATININE 1.57 (H) 06/14/2023   CALCIUM 9.2 06/14/2023   GFRNONAA 57 (L) 06/14/2023   GFRAA 94 10/31/2019   Lipid Panel:  Lab Results  Component Value Date   LDLCALC 265 (H) 06/14/2023   HgbA1c:  Lab Results  Component Value Date   HGBA1C 10.2 (H) 03/14/2021   Urine Drug Screen:     Component Value Date/Time   LABOPIA NONE DETECTED 03/14/2021 1728   COCAINSCRNUR NONE DETECTED 03/14/2021 1728   LABBENZ NONE DETECTED 03/14/2021 1728   AMPHETMU NONE DETECTED 03/14/2021 1728   THCU POSITIVE (A) 03/14/2021 1728   LABBARB NONE DETECTED 03/14/2021 1728    Alcohol Level     Component Value Date/Time   ETH <10 06/14/2023 0911     I have reviewed the images obtained:  CT Head without contrast 06/14/2023: Moderate-sized subacute infarct in the left basal ganglia/corona radiata with probable petechial hemorrhage. Mild regional mass effect but no midline shift.  MRI Brain and MRA head without contrast 06/14/2023: Acute to subacute perforator infarct at the left basal ganglia and adjacent white matter with petechial hemorrhage. Underline severe stenosis versus segmental occlusion at the left M1 segment.  The left ICA is smaller than the right and neck arterial imaging is recommended to exclude proximal stenosis with underfilling.  Carotid ultrasound 06/14/2023: Right carotid artery system: Patent  without significant atherosclerotic plaque formation. Left carotid artery system: Patent without significant atherosclerotic plaque formation. 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 Correlate with recently obtained echocardiogram.     ASSESSMENT/PLAN: 42yo M with right hemiplegia, MRI brain showing left BG infarct  Acute ischemic stroke Mass effect - etiology: likely embolic vs intracranial stenosis  Recommendations: - TTE pending. If negative please discuss with cardiology about TEE - Will consider CT chest abdo pelvis for occult malignancy workup as patient is young with  - will also consider hypercoag workup if TE negative - continue ASA 81mg  PO or 300mg  suppository until able to take PO> once able to take PO, will also need plavix 75mg  daily for 3 months if TE negative - Atorva 80mg  daily for sec  stroke prevention once able to take PO - patient is outside window fr permissive HTN. However, due to severe M1 stensois vs occlusion, would recommend slowly lowering BP> Goal 160-180 systolci for next 24 hours and if tolerated without symptoms, can lower to 140-160 over next few days.  - if symptoms recur, please make sure patient's head is flat, BP is at recommended goal and repeat CTH as MRI brain showing hemorrhagic conversion - PT/OT/swallow and speech eval - NIHSS per stroke protocol - Discussed plan with Dr Laural Benes   Thank you for allowing [place] to participate in the care of this patient. If you have any further questions, please contact  me or neurohospitalist.   Zachary Winters Epilepsy [facility]

## 2023-06-14 NOTE — ED Notes (Signed)
Pt had a BM, he was unaware he went. Pt cleaned and placed in a brief before transport to the floor

## 2023-06-14 NOTE — ED Provider Notes (Signed)
[place] EMERGENCY DEPARTMENT AT [facility] Provider Note   CSN: [id number] Arrival date & time: 06/14/23  4098     History  Chief Complaint  Patient presents with   Weakness    Zachary Winters is a 42 y.o. male.  HPI 42 year old male presents with right-sided weakness.  History initially from EMS and later the sister over the phone.  Patient lives with sister.  Last seen well on the morning of 6/17 when he was seen on camera going outside to smoke.  Glucose in the 90s for EMS.  When I talked to the sister he was on the floor 2 days ago and his glucose was 440.  He was communicating but not full conversations, just yes and no.  Seems tired.  Seem like he was weak though not as bad on his right side yesterday at least.  Has been sleeping and laying in his bed a lot.  She call 911 this morning because it seem like he was not communicating as well this morning though he is only been mumbling for the past couple days.  Difficult history from the patient who tries to speak but is seemingly barely able to.  He will seem to start a word and then not be able to finish the rest of what he is trying to say.  Home Medications Prior to Admission medications   Medication Sig Start Date End Date Taking? Authorizing Provider  atorvastatin (LIPITOR) 10 MG tablet Take 1 tablet (10 mg total) by mouth daily. Patient not taking: No sig reported 01/01/20   [name], NP  blood glucose meter kit and supplies Dispense based on patient and insurance preference. Check blood sugar four times daily as directed. (FOR ICD-10 E10.9, E11.9). 01/01/20   [name], NP  cyclobenzaprine (FLEXERIL) 10 MG tablet Take 1 tablet (10 mg total) by mouth 2 (two) times daily as needed for muscle spasms. Patient not taking: Reported on 04/20/2021 10/16/19   Alvira Monday, MD  gabapentin (NEURONTIN) 100 MG capsule Take 1 capsule (100 mg total) by mouth at bedtime. Patient not taking: No sig reported 01/01/20    [name], NP  HUMULIN 70/30 (70-30) 100 UNIT/ML injection Inject 18 Units into the skin 2 (two) times daily with a meal. 03/15/21   Vassie Loll, MD  Insulin Syringe-Needle U-100 [facility] INSULIN SYRINGES 31GX5/16") 31G X 5/16" 0.3 ML MISC Use as instructed to inject insulin twice a day. 03/15/21   Vassie Loll, MD  lisinopril (ZESTRIL) 10 MG tablet Take 1 tablet (10 mg total) by mouth daily. 03/15/21   Vassie Loll, MD  ondansetron (ZOFRAN ODT) 4 MG disintegrating tablet Take 1 tablet (4 mg total) by mouth every 8 (eight) hours as needed for nausea or vomiting. 05/20/21   Idol, Raynelle Fanning, PA-C  ondansetron (ZOFRAN) 4 MG tablet Take 1 tablet (4 mg total) by mouth every 6 (six) hours. 04/18/21   Triplett, Tammy, PA-C  pantoprazole (PROTONIX) 20 MG tablet Take 1 tablet (20 mg total) by mouth daily. 07/31/21   Bethann Berkshire, MD  potassium chloride SA (KLOR-CON) 20 MEQ tablet Take 1 tablet (20 mEq total) by mouth 2 (two) times daily for 7 days. 05/20/21 05/27/21  Burgess Amor, PA-C  promethazine (PHENERGAN) 25 MG suppository Place 1 suppository (25 mg total) rectally every 6 (six) hours as needed for nausea or vomiting. 07/31/21   Bethann Berkshire, MD      Allergies    Metformin and Penicillins  Review of Systems   Review of Systems  Unable to perform ROS: Mental status change    Physical Exam Updated Vital Signs BP (!) 170/105 (BP Location: Right Arm)   Pulse 77   Temp 98.7 F (37.1 C) (Oral)   Resp 15   Ht 6\' 1"  (1.854 m)   Wt 68 kg   SpO2 98%   BMI 19.78 kg/m  Physical Exam Vitals and nursing note reviewed.  Constitutional:      Appearance: He is well-developed.  HENT:     Head: Normocephalic and atraumatic.  Eyes:     Extraocular Movements: Extraocular movements intact.     Pupils: Pupils are equal, round, and reactive to light.     Comments: No neglect  Cardiovascular:     Rate and Rhythm: Normal rate and regular rhythm.     Pulses:          Radial pulses are 2+ on the right  side.     Heart sounds: Normal heart sounds.  Pulmonary:     Effort: Pulmonary effort is normal.     Breath sounds: Normal breath sounds.  Abdominal:     General: There is no distension.     Palpations: Abdomen is soft.     Tenderness: There is no abdominal tenderness.  Skin:    General: Skin is warm and dry.  Neurological:     Mental Status: He is alert.     Comments: Patient has a right sided facial droop. He has a completely flaccid right arm. The right leg can move just barely off stretcher. Left arm and leg seem to have normal strength. He shakes his head no when asked about sensation in right side.  He tries to speak, but either starts a word or can't articulate anything besides shaking head yes or no     ED Results / Procedures / Treatments   Labs (all labs ordered are listed, but only abnormal results are displayed) Labs Reviewed  COMPREHENSIVE METABOLIC PANEL - Abnormal; Notable for the following components:      Result Value   Potassium 3.4 (*)    Glucose, Bld 108 (*)    Creatinine, Ser 1.57 (*)    GFR, Estimated 57 (*)    All other components within normal limits  CBG MONITORING, ED - Abnormal; Notable for the following components:   Glucose-Capillary 109 (*)    All other components within normal limits  ETHANOL  PROTIME-INR  APTT  CBC  DIFFERENTIAL  RAPID URINE DRUG SCREEN, HOSP PERFORMED  URINALYSIS, ROUTINE W REFLEX MICROSCOPIC  LIPID PANEL  HEMOGLOBIN A1C  TSH  VITAMIN D 25 HYDROXY (VIT D DEFICIENCY, FRACTURES)  I-STAT CHEM 8, ED    EKG EKG Interpretation  Date/Time:  Wednesday June 14 2023 09:30:35 EDT Ventricular Rate:  86 PR Interval:  115 QRS Duration: 78 QT Interval:  374 QTC Calculation: 448 R Axis:   67 Text Interpretation: Sinus rhythm Borderline short PR interval Minimal ST depression, inferior leads Baseline wander in lead(s) V1 overall similar to April 2022 Confirmed by Pricilla Loveless [id number]) on 06/14/2023 9:47:56 AM  Radiology CT  HEAD WO CONTRAST  Result Date: 06/14/2023 CLINICAL DATA:  Right-sided weakness and facial droop. EXAM: CT HEAD WITHOUT CONTRAST TECHNIQUE: Contiguous axial images were obtained from the base of the skull through the vertex without intravenous contrast. RADIATION DOSE REDUCTION: This exam was performed according to the departmental dose-optimization program which includes automated exposure control, adjustment of the mA and/or kV according  to patient size and/or use of iterative reconstruction technique. COMPARISON:  CT head 03/14/2021. FINDINGS: Brain: There is hypodensity in the left internal capsule, lentiform nucleus, external capsule, and corona radiata consistent with subacute infarct. There is likely petechial hemorrhage within the infarct (2-17). There is mild edema with partial effacement of the body of the left lateral ventricle but no midline shift. There is no other evidence of acute intracranial hemorrhage or extra-axial fluid collection. Background parenchymal volume is normal. The ventricles are otherwise normal in size. The pituitary and suprasellar region are normal. There is no solid mass lesion. Vascular: There is calcification of the bilateral carotid siphons. Skull: Normal. Negative for fracture or focal lesion. Sinuses/Orbits: The paranasal sinuses are clear. There is a remote left orbital floor fracture. The globes and orbits are otherwise unremarkable. Other: The mastoid air cells and middle ear cavities are clear. IMPRESSION: Moderate-sized subacute infarct in the left basal ganglia/corona radiata with probable petechial hemorrhage. Mild regional mass effect but no midline shift. Critical Value/emergent results were called by telephone at the time of interpretation on 06/14/2023 at 9:53 am to provider Pricilla Loveless , who verbally acknowledged these results. Electronically Signed   By: Lesia Hausen M.D.   On: 06/14/2023 09:53    Procedures Procedures    Medications Ordered in  ED Medications  lactated ringers bolus 500 mL (has no administration in time range)    ED Course/ Medical Decision Making/ A&P Clinical Course as of 06/14/23 1030  Wed Jun 14, 2023  0865 I briefly discussed with Dr. Selina Cooley of neurology.  Given last known well seems to be about 48 hours, no indication for code stroke workup.  No CTA needed emergently, would otherwise work this up and admit as a typical stroke here. Will get CT head to ensure no head bleed. [SG]    Clinical Course User Index [SG] Pricilla Loveless, MD                             Medical Decision Making Amount and/or Complexity of Data Reviewed Labs: ordered.    Details: Mild AKI. Radiology: ordered and independent interpretation performed.    Details: Subacute stroke ECG/medicine tests: ordered and independent interpretation performed.    Details: Nonspecific changes Discussion of management or test interpretation with external provider(s): Dr. Theresia Bough of Radiology Dr. Selina Cooley of Neuro Dr. Laural Benes of Hospitalist group.  Risk Decision regarding hospitalization.   Patient presents with a subacute stroke, last known well around 48 hours ago. Is protecting his airway though his speech is clearly affected. CT head confirms stroke, though no significant bleed. Discussed with Radiology. Will need admission, but neuro feels patient can stay here for workup/treatment. Discussed with Dr. Laural Benes for admission.         Final Clinical Impression(s) / ED Diagnoses Final diagnoses:  Acute ischemic stroke [facility])    Rx / DC Orders ED Discharge Orders     None         Pricilla Loveless, MD 06/14/23 1032

## 2023-06-15 ENCOUNTER — Inpatient Hospital Stay (HOSPITAL_COMMUNITY): Payer: Medicaid Other

## 2023-06-15 DIAGNOSIS — E559 Vitamin D deficiency, unspecified: Secondary | ICD-10-CM

## 2023-06-15 DIAGNOSIS — I152 Hypertension secondary to endocrine disorders: Secondary | ICD-10-CM

## 2023-06-15 DIAGNOSIS — I639 Cerebral infarction, unspecified: Secondary | ICD-10-CM | POA: Diagnosis not present

## 2023-06-15 DIAGNOSIS — R4701 Aphasia: Secondary | ICD-10-CM | POA: Diagnosis not present

## 2023-06-15 DIAGNOSIS — F17200 Nicotine dependence, unspecified, uncomplicated: Secondary | ICD-10-CM | POA: Diagnosis not present

## 2023-06-15 DIAGNOSIS — E1159 Type 2 diabetes mellitus with other circulatory complications: Secondary | ICD-10-CM

## 2023-06-15 LAB — RAPID URINE DRUG SCREEN, HOSP PERFORMED
Amphetamines: NOT DETECTED
Barbiturates: NOT DETECTED
Benzodiazepines: NOT DETECTED
Cocaine: NOT DETECTED
Opiates: NOT DETECTED
Tetrahydrocannabinol: POSITIVE — AB

## 2023-06-15 LAB — GLUCOSE, CAPILLARY
Glucose-Capillary: 117 mg/dL — ABNORMAL HIGH (ref 70–99)
Glucose-Capillary: 153 mg/dL — ABNORMAL HIGH (ref 70–99)
Glucose-Capillary: 161 mg/dL — ABNORMAL HIGH (ref 70–99)
Glucose-Capillary: 184 mg/dL — ABNORMAL HIGH (ref 70–99)
Glucose-Capillary: 220 mg/dL — ABNORMAL HIGH (ref 70–99)
Glucose-Capillary: 230 mg/dL — ABNORMAL HIGH (ref 70–99)

## 2023-06-15 LAB — BASIC METABOLIC PANEL
Anion gap: 10 (ref 5–15)
BUN: 16 mg/dL (ref 6–20)
CO2: 25 mmol/L (ref 22–32)
Calcium: 8.6 mg/dL — ABNORMAL LOW (ref 8.9–10.3)
Chloride: 106 mmol/L (ref 98–111)
Creatinine, Ser: 1.49 mg/dL — ABNORMAL HIGH (ref 0.61–1.24)
GFR, Estimated: >60 mL/min (ref >60)
Glucose, Bld: 157 mg/dL — ABNORMAL HIGH (ref 70–99)
Potassium: 3.6 mmol/L (ref 3.5–5.1)
Sodium: 141 mmol/L (ref 135–145)

## 2023-06-15 LAB — VITAMIN D 25 HYDROXY (VIT D DEFICIENCY, FRACTURES): Vit D, 25-Hydroxy: 14.61 ng/mL — ABNORMAL LOW (ref 30–100)

## 2023-06-15 LAB — URINALYSIS, ROUTINE W REFLEX MICROSCOPIC
Bilirubin Urine: NEGATIVE
Glucose, UA: 150 mg/dL — AB
Hgb urine dipstick: NEGATIVE
Ketones, ur: 5 mg/dL — AB
Leukocytes,Ua: NEGATIVE
Nitrite: NEGATIVE
Protein, ur: >=300 mg/dL — AB
Specific Gravity, Urine: 1.025 (ref 1.005–1.030)
pH: 5 (ref 5.0–8.0)

## 2023-06-15 LAB — MAGNESIUM: Magnesium: 2 mg/dL (ref 1.7–2.4)

## 2023-06-15 LAB — HIV ANTIBODY (ROUTINE TESTING W REFLEX): HIV Screen 4th Generation wRfx: NONREACTIVE

## 2023-06-15 MED ORDER — IOHEXOL 9 MG/ML PO SOLN
ORAL | Status: AC
  Filled 2023-06-15: qty 1000

## 2023-06-15 MED ORDER — HYDRALAZINE HCL 20 MG/ML IJ SOLN: 5.0000 mg | INTRAMUSCULAR | Status: DC | PRN

## 2023-06-15 MED ORDER — INSULIN ASPART 100 UNIT/ML IJ SOLN
0.0000 [IU] | Freq: Three times a day (TID) | INTRAMUSCULAR | Status: DC
  Administered 2023-06-15 (×2): 3 [IU] via SUBCUTANEOUS
  Administered 2023-06-16: 2 [IU] via SUBCUTANEOUS
  Administered 2023-06-16 – 2023-06-17 (×3): 3 [IU] via SUBCUTANEOUS
  Administered 2023-06-17: 1 [IU] via SUBCUTANEOUS

## 2023-06-15 MED ORDER — INSULIN ASPART 100 UNIT/ML IJ SOLN
3.0000 [IU] | Freq: Three times a day (TID) | INTRAMUSCULAR | Status: DC
  Administered 2023-06-15: 3 [IU] via SUBCUTANEOUS

## 2023-06-15 MED ORDER — VITAMIN D (ERGOCALCIFEROL) 1.25 MG (50000 UNIT) PO CAPS
50000.0000 [IU] | ORAL_CAPSULE | ORAL | Status: DC
  Administered 2023-06-15: 50000 [IU] via ORAL
  Filled 2023-06-15: qty 1

## 2023-06-15 MED ORDER — AMLODIPINE BESYLATE 5 MG PO TABS
5.0000 mg | ORAL_TABLET | Freq: Every day | ORAL | Status: DC
  Administered 2023-06-15 – 2023-06-19 (×5): 5 mg via ORAL
  Filled 2023-06-15 (×5): qty 1

## 2023-06-15 MED ORDER — HYDRALAZINE HCL 20 MG/ML IJ SOLN
10.0000 mg | INTRAMUSCULAR | Status: DC | PRN
  Administered 2023-06-16: 10 mg via INTRAVENOUS
  Filled 2023-06-15: qty 1

## 2023-06-15 MED ORDER — IOHEXOL 300 MG/ML  SOLN
100.0000 mL | Freq: Once | INTRAMUSCULAR | Status: AC | PRN
  Administered 2023-06-15: 100 mL via INTRAVENOUS

## 2023-06-15 NOTE — Progress Notes (Signed)
PROGRESS NOTE   Zachary Winters  ZOX:[id number] DOB: [birth year] DOA: 06/14/2023 PCP: System, Provider Not In   Chief Complaint  Patient presents with   Weakness   Level of care: Telemetry  Brief Admission History:  42 year old gentleman with poorly controlled diabetes mellitus on insulin, chronic daily smoker, hypertension, history of marijuana use, diabetic peripheral neuropathy, hyperlipidemia was sent to the emergency department by [facility] with right-sided weakness right-sided facial droop and difficulty speaking.  He was last known to be well 2 days ago at 6 AM when his sister noticed he went out to smoke on ring camera.  He apparently lives with his sister.  Sister noticed the patient lying on the floor having a difficult time communicating only able to nod yes and no.  She noticed that for the last 2 days he has been lying around and sleeping.  He was seen in the ED and noted to have a significant aphasia and right-sided hemiparesis.  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  His blood glucose was 109.  Code Stroke was not activated due to being 40 hours since last known well time.  Pt is being admitted into the hospital for further work up and treatment of acute CVA.     Assessment and Plan:  Acute CVA of left basal ganglia  -Pt presented with a dense right hemiparesis and aphasia that is unchanged -pt presented 2 days after last known well time  -MRI brain with findings of acute to subacute perforator infarct at the left basal ganglia and adjacent white matter with petechial hemorrhage. 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 -admitted for full stroke work up and inpatient neurology consultation  -SLP recommending Dys 2 diet for now. -neurology recommending starting asp 81 mg with plavix 75 mg daily for 90 days followed by aspirin  alone -started atorvastatin 80 mg daily  -2D echocardiogram and carotid dopplers ordered -TEE recommended by neurology.  I have reached out to cardiology and tentative plan for TEE on 6/21.  -PT/OT/SLP evaluation ordered and recommending CIR evaluation for rehab  -lipid panel - suboptimally controlled; better glycemic control will also improve lipids -NPO after midnight for TEE on 6/21.  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 -Continue to monitor CBG ACHS and provide SSI coverage as needed -try to avoid hypoglycemia -follow up A1c - 11.1% as evidence of poorly controlled disease  -follow up TSH - WNL   Essential Hypertension - next goal BP is 140-160 over next few days per neuro   Hyperlipidemia - working on improving glycemic control - atorvastatin 80 mg daily ordered  Severe Vitamin D deficiency - Drisdol 50 k caps once weekly started    Tobacco Abuser - Pt needs to absolutely stop all tobacco use - nicotine patch ordered if needed - counseling on tobacco cessation ongoing   Recreational Drug use - per history, will check a urine toxicology screen to see if he has been using recently - still waiting on urine drug  screen to be collected    Diabetic peripheral polyneuropathy - he hadn't been taking any home med for this recently - not currently complaining of symptoms - working on improving glycemic control     DVT prophylaxis: enoxaparin Code Status: Full  Family Communication: sister, grandmother 6/19 Disposition: Status is: Inpatient    Consultants:  Neurology  Cardiology for  TEE PT/OT/SLP CIR for eval   Procedures:  TEE pending for 06/16/23  Antimicrobials:    Subjective: Continues to have expressive aphasia, able to blink and nod his head  Objective: Vitals:   06/14/23 2356 06/15/23 0030 06/15/23 0401 06/15/23 0536  BP: (!) 158/105 (!) 169/95 (!) 162/103 (!) 184/97  Pulse: 85  95   Resp:      Temp: 99 F (37.2 C)  98.9 F (37.2 C)   TempSrc: Oral  Oral   SpO2: 100%  100%   Weight:      Height:        Intake/Output Summary (Last 24 hours) at 06/15/2023 0959 Last data filed at 06/15/2023 0535 Gross per 24 hour  Intake 360 ml  Output --  Net 360 ml   Filed Weights   06/14/23 0842  Weight: 68 kg   Examination:  General exam: Appears calm and comfortable, sitting up in chair, expressive aphasia, dense right hemiparesis unchanged Respiratory system: Clear to auscultation. Respiratory effort normal. Cardiovascular system: normal S1 & S2 heard. No JVD, murmurs, rubs, gallops or clicks. No pedal edema. Gastrointestinal system: Abdomen is nondistended, soft and nontender. No organomegaly or masses felt. Normal bowel sounds heard. Central nervous system: Alert and oriented. Aphasic, dense right hemiparesis unchanged.  Extremities: dense right hemiparesis Skin: No rashes, lesions or ulcers. Psychiatry: Judgement and insight UTD. Mood & affect UTD.   Data Reviewed: I have personally reviewed following labs and imaging studies  CBC: Recent Labs  Lab 06/14/23 0911  WBC 10.0  NEUTROABS 7.5  HGB 13.1  HCT 40.2  MCV 91.2  PLT 278    Basic Metabolic Panel: Recent Labs  Lab 06/14/23 0911 06/15/23 0514  NA 143 141  K 3.4* 3.6  CL 106 106  CO2 27 25  GLUCOSE 108* 157*  BUN 19 16  CREATININE 1.57* 1.49*  CALCIUM 9.2 8.6*  MG  --  2.0    CBG: Recent Labs  Lab 06/14/23 0849 06/14/23 2012 06/14/23 2359 06/15/23 0407 06/15/23 0732  GLUCAP 109* 189* 161* 153* 117*    No results found for this or any previous visit (from the  past 240 hour(s)).   Radiology Studies: ECHOCARDIOGRAM COMPLETE  Result Date: 06/14/2023    ECHOCARDIOGRAM REPORT   Patient Name:   Zachary Winters Date of Exam: 06/14/2023 Medical Rec #:  [id number]       Height:       73.0 in Accession #:    [id number]      Weight:       149.9 lb Date of Birth:  [birth year]       BSA:          1.904 m Patient Age:    42 years        BP:           180/92 mmHg Patient Gender: M               HR:           77 bpm. [facility] Procedure: 2D Echo, Cardiac Doppler and Color Doppler Indications:  Stroke I63.9  History:        Patient has no prior history of Echocardiogram examinations.                 Risk Factors:Hypertension, Diabetes, Dyslipidemia and Current                 Smoker.  Sonographer:    Celesta Gentile RCS Referring Phys: [phone] Shanequia Kendrick L Davetta Olliff IMPRESSIONS  1. Left ventricular ejection fraction, by estimation, is 60 to 65%. The left ventricle has normal function. The left ventricle has no regional wall motion abnormalities. There is moderate concentric left ventricular hypertrophy. Left ventricular diastolic parameters are consistent with Grade I diastolic dysfunction (impaired relaxation).  2. Right ventricular systolic function is normal. The right ventricular size is normal. Tricuspid regurgitation signal is inadequate for assessing PA pressure.  3. The mitral valve is grossly normal. Trivial mitral valve regurgitation.  4. The aortic valve is tricuspid. There is mild calcification of the aortic valve. Aortic valve regurgitation is not visualized. Aortic valve sclerosis is present, with no evidence of aortic valve stenosis. Aortic valve mean gradient measures 5.0 mmHg.  5. The inferior vena cava is normal in size with greater than 50% respiratory variability, suggesting right atrial pressure of 3 mmHg. Comparison(s): No prior Echocardiogram. FINDINGS  Left Ventricle: Left ventricular ejection fraction, by estimation, is 60 to 65%. The left ventricle  has normal function. The left ventricle has no regional wall motion abnormalities. Definity contrast agent was given IV to delineate the left ventricular  endocardial borders. The left ventricular internal cavity size was normal in size. There is moderate concentric left ventricular hypertrophy. Left ventricular diastolic parameters are consistent with Grade I diastolic dysfunction (impaired relaxation). Right Ventricle: The right ventricular size is normal. No increase in right ventricular wall thickness. Right ventricular systolic function is normal. Tricuspid regurgitation signal is inadequate for assessing PA pressure. Left Atrium: Left atrial size was normal in size. Right Atrium: Right atrial size was normal in size. Pericardium: There is no evidence of pericardial effusion. Mitral Valve: The mitral valve is grossly normal. Trivial mitral valve regurgitation. Tricuspid Valve: The tricuspid valve is grossly normal. Tricuspid valve regurgitation is trivial. Aortic Valve: The aortic valve is tricuspid. There is mild calcification of the aortic valve. Aortic valve regurgitation is not visualized. Aortic valve sclerosis is present, with no evidence of aortic valve stenosis. Aortic valve mean gradient measures 5.0 mmHg. Aortic valve peak gradient measures 12.7 mmHg. Aortic valve area, by VTI measures 1.55 cm. Pulmonic Valve: The pulmonic valve was grossly normal. Pulmonic valve regurgitation is trivial. Aorta: The aortic root is normal in size and structure. Venous: The inferior vena cava is normal in size with greater than 50% respiratory variability, suggesting right atrial pressure of 3 mmHg. IAS/Shunts: No atrial level shunt detected by color flow Doppler.  LEFT VENTRICLE PLAX 2D LVIDd:         4.40 cm   Diastology LVIDs:         3.20 cm   LV e' medial:    5.33 cm/s LV PW:         1.40 cm   LV E/e' medial:  10.6 LV IVS:        1.30 cm   LV e' lateral:   7.72 cm/s LVOT diam:     1.90 cm   LV E/e' lateral: 7.3 LV  SV:         43 LV SV Index:   22 LVOT Area:  2.84 cm  RIGHT VENTRICLE RV S prime:     11.40 cm/s TAPSE (M-mode): 2.1 cm LEFT ATRIUM             Index        RIGHT ATRIUM           Index LA diam:        3.00 cm 1.58 cm/m   RA Area:     12.50 cm LA Vol (A2C):   63.1 ml 33.15 ml/m  RA Volume:   30.80 ml  16.18 ml/m LA Vol (A4C):   36.0 ml 18.91 ml/m LA Biplane Vol: 48.1 ml 25.27 ml/m  AORTIC VALVE AV Area (Vmax):    1.45 cm AV Area (Vmean):   1.67 cm AV Area (VTI):     1.55 cm AV Vmax:           178.00 cm/s AV Vmean:          102.000 cm/s AV VTI:            0.277 m AV Peak Grad:      12.7 mmHg AV Mean Grad:      5.0 mmHg LVOT Vmax:         91.20 cm/s LVOT Vmean:        60.000 cm/s LVOT VTI:          0.151 m LVOT/AV VTI ratio: 0.55  AORTA Ao Root diam: 3.30 cm MITRAL VALVE MV Area (PHT): 3.37 cm    SHUNTS MV Decel Time: 225 msec    Systemic VTI:  0.15 m MV E velocity: 56.30 cm/s  Systemic Diam: 1.90 cm MV A velocity: 71.90 cm/s MV E/A ratio:  0.78 Nona Dell MD Electronically signed by Nona Dell MD Signature Date/Time: 06/14/2023/5:02:28 PM    Final    MR BRAIN WO CONTRAST  Result Date: 06/14/2023 CLINICAL DATA:  Neuro deficit with acute stroke suspected EXAM: MRI HEAD WITHOUT CONTRAST MRA HEAD WITHOUT CONTRAST TECHNIQUE: Multiplanar, multi-echo pulse sequences of the brain and surrounding structures were acquired without intravenous contrast. Angiographic images of the Circle of Willis were acquired using MRA technique without intravenous contrast. COMPARISON:  Head CT from earlier today FINDINGS: MRI HEAD FINDINGS Brain: Acute perforator infarct affecting the left basal ganglia with central blood products that is petechial hemorrhage when correlated with CT. No hydrocephalus or extra-axial collection. Negative for mass Vascular: Major flow voids are preserved. Skull and upper cervical spine: Normal marrow signal Sinuses/Orbits: Left mastoid opacification with negative nasopharynx. MRA HEAD  FINDINGS Anterior circulation: The left ICA is smaller than the right. This may be partially due to circle-of-Willis variation given the hypoplastic potential of left A1 segment, but may also be related to underfilling in the setting of ipsilateral stenoses. Advanced stenosis or short segment occlusion at the left M1 segment with poorly visualized downstream branch vessel flow. Atheromatous irregularity diffusely affects intracranial branches. No detected aneurysm. Posterior circulation: At least moderate stenosis of the non dominant left vertebral artery just before the PICA. The basilar is widely patent. Posterior cerebral arteries are smoothly contoured and widely patent. Negative for aneurysm These results will be called to the ordering clinician or representative by the Radiologist Assistant, and communication documented in the PACS or [employer]. Prelim sent in epic chat. IMPRESSION: 1. Acute to subacute perforator infarct at the left basal ganglia and adjacent white matter with petechial hemorrhage. 2. Underline severe stenosis versus segmental occlusion at the left M1 segment. 3. The left ICA is smaller than  the right and neck arterial imaging is recommended to exclude proximal stenosis with underfilling. Electronically Signed   By: Tiburcio Pea M.D.   On: 06/14/2023 12:44   MR ANGIO HEAD WO CONTRAST  Result Date: 06/14/2023 CLINICAL DATA:  Neuro deficit with acute stroke suspected EXAM: MRI HEAD WITHOUT CONTRAST MRA HEAD WITHOUT CONTRAST TECHNIQUE: Multiplanar, multi-echo pulse sequences of the brain and surrounding structures were acquired without intravenous contrast. Angiographic images of the Circle of Willis were acquired using MRA technique without intravenous contrast. COMPARISON:  Head CT from earlier today FINDINGS: MRI HEAD FINDINGS Brain: Acute perforator infarct affecting the left basal ganglia with central blood products that is petechial hemorrhage when correlated with CT. No  hydrocephalus or extra-axial collection. Negative for mass Vascular: Major flow voids are preserved. Skull and upper cervical spine: Normal marrow signal Sinuses/Orbits: Left mastoid opacification with negative nasopharynx. MRA HEAD FINDINGS Anterior circulation: The left ICA is smaller than the right. This may be partially due to circle-of-Willis variation given the hypoplastic potential of left A1 segment, but may also be related to underfilling in the setting of ipsilateral stenoses. Advanced stenosis or short segment occlusion at the left M1 segment with poorly visualized downstream branch vessel flow. Atheromatous irregularity diffusely affects intracranial branches. No detected aneurysm. Posterior circulation: At least moderate stenosis of the non dominant left vertebral artery just before the PICA. The basilar is widely patent. Posterior cerebral arteries are smoothly contoured and widely patent. Negative for aneurysm These results will be called to the ordering clinician or representative by the Radiologist Assistant, and communication documented in the PACS or [employer]. Prelim sent in epic chat. IMPRESSION: 1. Acute to subacute perforator infarct at the left basal ganglia and adjacent white matter with petechial hemorrhage. 2. Underline severe stenosis versus segmental occlusion at the left M1 segment. 3. The left ICA is smaller than the right and neck arterial imaging is recommended to exclude proximal stenosis with underfilling. Electronically Signed   By: Tiburcio Pea M.D.   On: 06/14/2023 12:44   US Carotid Bilateral (at [facility] and [facility] only)  Result Date: 06/14/2023 CLINICAL DATA:  42 year old male with history of ischemic stroke. EXAM: BILATERAL CAROTID DUPLEX ULTRASOUND TECHNIQUE: Wallace Cullens scale imaging, color Doppler and duplex ultrasound were performed of bilateral carotid and vertebral arteries in the neck. COMPARISON:  None Available. FINDINGS: Criteria: Quantification of carotid stenosis  is based on velocity parameters that correlate the residual internal carotid diameter with NASCET-based stenosis levels, using the diameter of the distal internal carotid lumen as the denominator for stenosis measurement. The following velocity measurements were obtained: RIGHT ICA: Peak systolic velocity 83 cm/sec, End diastolic velocity 25 cm/sec CCA: Peak systolic velocity 98 cm/sec SYSTOLIC ICA/CCA RATIO:  0.8 ECA: Peak systolic velocity 85 cm/sec LEFT ICA: Peak systolic velocity 42 cm/sec, End diastolic velocity 16 cm/sec CCA: 77 cm/sec SYSTOLIC ICA/CCA RATIO:  0.5 ECA: 89 cm/sec RIGHT CAROTID ARTERY: No atherosclerotic plaque formation. No significant tortuosity. Pulsus bisferians is noted, low resistance waveforms. RIGHT VERTEBRAL ARTERY:  Antegrade flow. LEFT CAROTID ARTERY: No atherosclerotic plaque formation. No significant tortuosity. Pulsus bisferians is noted, low resistance waveforms. LEFT VERTEBRAL ARTERY:  Antegrade flow. Upper extremity non-invasive blood pressures: Not obtained. IMPRESSION: 1. Right carotid artery system: Patent without significant atherosclerotic plaque formation. 2. Left carotid artery system: Patent without significant atherosclerotic plaque formation. 3.  Vertebral artery system: Patent with antegrade flow bilaterally. 4. 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 Correlate with recently obtained echocardiogram. Marliss Coots, MD [facility] Electronically Signed   By: Marliss Coots M.D.   On: 06/14/2023 12:37   CT HEAD WO CONTRAST  Result Date: 06/14/2023 CLINICAL DATA:  Right-sided weakness and facial droop. EXAM: CT HEAD WITHOUT CONTRAST TECHNIQUE: Contiguous axial images were obtained from the base of the skull through the vertex without intravenous contrast. RADIATION DOSE REDUCTION: This exam was performed according to the departmental  dose-optimization program which includes automated exposure control, adjustment of the mA and/or kV according to patient size and/or use of iterative reconstruction technique. COMPARISON:  CT head 03/14/2021. FINDINGS: Brain: There is hypodensity in the left internal capsule, lentiform nucleus, external capsule, and corona radiata consistent with subacute infarct. There is likely petechial hemorrhage within the infarct (2-17). There is mild edema with partial effacement of the body of the left lateral ventricle but no midline shift. There is no other evidence of acute intracranial hemorrhage or extra-axial fluid collection. Background parenchymal volume is normal. The ventricles are otherwise normal in size. The pituitary and suprasellar region are normal. There is no solid mass lesion. Vascular: There is calcification of the bilateral carotid siphons. Skull: Normal. Negative for fracture or focal lesion. Sinuses/Orbits: The paranasal sinuses are clear. There is a remote left orbital floor fracture. The globes and orbits are otherwise unremarkable. Other: The mastoid air cells and middle ear cavities are clear. IMPRESSION: Moderate-sized subacute infarct in the left basal ganglia/corona radiata with probable petechial hemorrhage. Mild regional mass effect but no midline shift. Critical Value/emergent results were called by telephone at the time of interpretation on 06/14/2023 at 9:53 am to provider Pricilla Loveless , who verbally acknowledged these results. Electronically Signed   By: Lesia Hausen M.D.   On: 06/14/2023 09:53    Scheduled Meds:   stroke: early stages of recovery book   Does not apply Once   aspirin EC  81 mg Oral Daily   atorvastatin  80 mg Oral QPM   clopidogrel  75 mg Oral Daily   enoxaparin (LOVENOX) injection  40 mg Subcutaneous Daily   insulin aspart  0-9 Units Subcutaneous Q4H   Vitamin D (Ergocalciferol)  50,000 Units Oral Q7 days   Continuous Infusions:  sodium chloride 75 mL/hr at  06/15/23 0204     LOS: 1 day   Time spent: 44 mins  Zayden Hahne Laural Benes, MD How to contact the [facility] Attending or Consulting provider 7A - 7P or covering provider during after hours 7P -7A, for this patient?  Check the care team in [facility] and look for a) attending/consulting [facility] provider listed and b) the [facility] team listed Log into [email or web address] and use [place]'s universal password to access. If you do not have the password, please contact the hospital operator. Locate the [facility] provider you are looking for under [facility] and page to a number that you can be directly reached. If you still have difficulty reaching the provider, please page the [facility] (Director on Call) for the Hospitalists listed on amion for assistance.  06/15/2023, 9:59 AM

## 2023-06-15 NOTE — Evaluation (Signed)
Physical Therapy Evaluation Patient Details Name: Zachary Winters MRN: [id number] DOB: [birth year] Today's Date: 06/15/2023  History of Present Illness  Zachary Winters is a 42 year old gentleman with poorly controlled diabetes mellitus on insulin, chronic daily smoker, hypertension, history of marijuana use, diabetic peripheral neuropathy, hyperlipidemia was sent to the emergency department by [facility] with right-sided weakness right-sided facial droop and difficulty speaking.  He was last known to be well 2 days ago at 6 AM when his sister noticed he went out to smoke on ring camera.  He apparently lives with his sister.  Sister noticed the patient lying on the floor having a difficult time communicating only able to nod yes and no.  She noticed that for the last 2 days he has been lying around and sleeping.   Clinical Impression  Patient demonstrates slow labored movement for sitting up at bedside with difficulty moving right side due to weakness/paralysis, presents with stiffness and spastic like movement of RUE/LE, unable to dorsiflex right ankle, limited to a few unsteady labored steps at bedside before having to sit due to knees buckling with loss of balance.  Patient tolerated sitting up in chair after therapy - nursing staff aware.  Patient will benefit from continued skilled physical therapy in hospital and recommended venue below to increase strength, balance, endurance for safe ADLs and gait.          Recommendations for follow up therapy are one component of a multi-disciplinary discharge planning process, led by the attending physician.  Recommendations may be updated based on patient status, additional functional criteria and insurance authorization.  Follow Up Recommendations       Assistance Recommended at Discharge Set up Supervision/Assistance  Patient can return home with the following  A lot of help with bathing/dressing/bathroom;A lot of help with walking and/or transfers;Help  with stairs or ramp for entrance;Assistance with cooking/housework    Equipment Recommendations None recommended by PT;Other (comment) (to be determined)  Recommendations for Other Services       Functional Status Assessment Patient has had a recent decline in their functional status and demonstrates the ability to make significant improvements in function in a reasonable and predictable amount of time.     Precautions / Restrictions Precautions Precautions: Fall Restrictions Weight Bearing Restrictions: No      Mobility  Bed Mobility Overal bed mobility: Needs Assistance Bed Mobility: Supine to Sit     Supine to sit: Mod assist     General bed mobility comments: had difficulty moving right side    Transfers Overall transfer level: Needs assistance Equipment used: Rolling walker (2 wheels) Transfers: Sit to/from Stand, Bed to chair/wheelchair/BSC Sit to Stand: Mod assist, Max assist   Step pivot transfers: Mod assist       General transfer comment: unsteady labored movement with poor coordination of RLE, frequent buckling of right knee    Ambulation/Gait Ambulation/Gait assistance: Max assist Gait Distance (Feet): 5 Feet Assistive device: Rolling walker (2 wheels) Gait Pattern/deviations: Decreased step length - right, Decreased stance time - right, Decreased step length - left, Knees buckling, Decreased stride length, Antalgic, Ataxic Gait velocity: slow     General Gait Details: limited to a few slow labored side steps and steps forward backwards before having to sit due to RLE buckling with loss of balance  Stairs            Wheelchair Mobility    Modified Rankin (Stroke Patients Only)       Balance Overall  balance assessment: Needs assistance Sitting-balance support: Feet supported, No upper extremity supported Sitting balance-Leahy Scale: Poor Sitting balance - Comments: fair/poor seated at EOB   Standing balance support: No upper extremity  supported, Bilateral upper extremity supported Standing balance-Leahy Scale: Poor Standing balance comment: using RW                             Pertinent Vitals/Pain Pain Assessment Pain Assessment: No/denies pain    Home Living Family/patient expects to be discharged to:: Private residence Living Arrangements: Other relatives Available Help at Discharge: Family;Available 24 hours/day Type of Home: House Home Access: Level entry       Home Layout: Able to live on main level with bedroom/bathroom Home Equipment: None      Prior Function Prior Level of Function : Independent/Modified Independent             Mobility Comments: Community ambulator without AD. ADLs Comments: Independent     Hand Dominance   Dominant Hand: Right    Extremity/Trunk Assessment   Upper Extremity Assessment Upper Extremity Assessment: Defer to OT evaluation RUE Deficits / Details: Minimal tone in R shoulder and elbow. Flaccid with no active movement of any kind in R UE. RUE Sensation: WNL RUE Coordination: decreased fine motor;decreased gross motor    Lower Extremity Assessment Lower Extremity Assessment: Generalized weakness;RLE deficits/detail RLE Deficits / Details: grossly -3/5, except ankle dorsiflexion 0/5, stiffnes, spastic like movement of RLE RLE Sensation: decreased light touch;decreased proprioception RLE Coordination: decreased fine motor;decreased gross motor    Cervical / Trunk Assessment Cervical / Trunk Assessment: Normal  Communication   Communication: Expressive difficulties  Cognition Arousal/Alertness: Awake/alert Behavior During Therapy: WFL for tasks assessed/performed Overall Cognitive Status: Within Functional Limits for tasks assessed                                          General Comments      Exercises     Assessment/Plan    PT Assessment Patient needs continued PT services  PT Problem List Decreased  strength;Decreased activity tolerance;Decreased balance;Decreased mobility;Decreased coordination       PT Treatment Interventions DME instruction;Gait training;Stair training;Functional mobility training;Therapeutic activities;Therapeutic exercise;Patient/family education;Balance training;Neuromuscular re-education    PT Goals (Current goals can be found in the Care Plan section)  Acute Rehab PT Goals Patient Stated Goal: return home PT Goal Formulation: With patient Time For Goal Achievement: 06/29/23 Potential to Achieve Goals: Good    Frequency Min 4X/week     Co-evaluation PT/OT/SLP Co-Evaluation/Treatment: Yes Reason for Co-Treatment: Complexity of the patient's impairments (multi-system involvement);To address functional/ADL transfers PT goals addressed during session: Mobility/safety with mobility;Balance;Proper use of DME OT goals addressed during session: ADL's and self-care       AM-PAC PT "6 Clicks" Mobility  Outcome Measure Help needed turning from your back to your side while in a flat bed without using bedrails?: A Lot Help needed moving from lying on your back to sitting on the side of a flat bed without using bedrails?: A Lot Help needed moving to and from a bed to a chair (including a wheelchair)?: A Lot Help needed standing up from a chair using your arms (e.g., wheelchair or bedside chair)?: A Lot Help needed to walk in hospital room?: A Lot Help needed climbing 3-5 steps with a railing? : Total 6 Click  Score: 11    End of Session Equipment Utilized During Treatment: Gait belt Activity Tolerance: Patient tolerated treatment well;Patient limited by fatigue Patient left: in chair;with call bell/phone within reach;with chair alarm set Nurse Communication: Mobility status PT Visit Diagnosis: Unsteadiness on feet (R26.81);Other abnormalities of gait and mobility (R26.89);Muscle weakness (generalized) (M62.81)    Time: 1610-9604 PT Time Calculation (min)  (ACUTE ONLY): 30 min   Charges:   PT Evaluation $PT Eval Moderate Complexity: 1 Mod PT Treatments $Therapeutic Activity: 23-37 mins        12:30 PM, 06/15/23 Ocie Bob, MPT Physical Therapist with [facility] 336 [phone] office [phone] mobile phone

## 2023-06-15 NOTE — H&P (View-Only) (Signed)
PROGRESS NOTE   Zachary Winters  MRN:[id number] DOB: 06/04/1981 DOA: 06/14/2023 PCP: System, Provider Not In   Chief Complaint  Patient presents with   Weakness   Level of care: Telemetry  Brief Admission History:  42-year-old gentleman with poorly controlled diabetes mellitus on insulin, chronic daily smoker, hypertension, history of marijuana use, diabetic peripheral neuropathy, hyperlipidemia was sent to the emergency department by [facility] with right-sided weakness right-sided facial droop and difficulty speaking.  He was last known to be well 2 days ago at 6 AM when his sister noticed he went out to smoke on ring camera.  He apparently lives with his sister.  Sister noticed the patient lying on the floor having a difficult time communicating only able to nod yes and no.  She noticed that for the last 2 days he has been lying around and sleeping.  He was seen in the ED and noted to have a significant aphasia and right-sided hemiparesis.  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  His blood glucose was 109.  Code Stroke was not activated due to being 48 hours since last known well time.  Pt is being admitted into the hospital for further work up and treatment of acute CVA.     Assessment and Plan:  Acute CVA of left basal ganglia  -Pt presented with a dense right hemiparesis and aphasia that is unchanged -pt presented 2 days after last known well time  -MRI brain with findings of acute to subacute perforator infarct at the left basal ganglia and adjacent white matter with petechial hemorrhage. 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 -admitted for full stroke work up and inpatient neurology consultation  -SLP recommending Dys 2 diet for now. -neurology recommending starting asp 81 mg with plavix 75 mg daily for 90 days followed by aspirin  alone -started atorvastatin 80 mg daily  -2D echocardiogram and carotid dopplers ordered -TEE recommended by neurology.  I have reached out to cardiology and tentative plan for TEE on 6/21.  -PT/OT/SLP evaluation ordered and recommending CIR evaluation for rehab  -lipid panel - suboptimally controlled; better glycemic control will also improve lipids -NPO after midnight for TEE on 6/21.  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 -Continue to monitor CBG ACHS and provide SSI coverage as needed -try to avoid hypoglycemia -follow up A1c - 11.1% as evidence of poorly controlled disease  -follow up TSH - WNL   Essential Hypertension - next goal BP is 140-160 over next few days per neuro   Hyperlipidemia - working on improving glycemic control - atorvastatin 80 mg daily ordered  Severe Vitamin D deficiency - Drisdol 50 k caps once weekly started    Tobacco Abuser - Pt needs to absolutely stop all tobacco use - nicotine patch ordered if needed - counseling on tobacco cessation ongoing   Recreational Drug use - per history, will check a urine toxicology screen to see if he has been using recently - still waiting on urine drug   screen to be collected    Diabetic peripheral polyneuropathy - he hadn't been taking any home med for this recently - not currently complaining of symptoms - working on improving glycemic control     DVT prophylaxis: enoxaparin Code Status: Full  Family Communication: sister, grandmother 6/19 Disposition: Status is: Inpatient    Consultants:  Neurology  Cardiology for  TEE PT/OT/SLP CIR for eval   Procedures:  TEE pending for 06/16/23  Antimicrobials:    Subjective: Continues to have expressive aphasia, able to blink and nod his head  Objective: Vitals:   06/14/23 2356 06/15/23 0030 06/15/23 0401 06/15/23 0536  BP: (!) 158/105 (!) 169/95 (!) 162/103 (!) 184/97  Pulse: 85  95   Resp:      Temp: 99 F (37.2 C)  98.9 F (37.2 C)   TempSrc: Oral  Oral   SpO2: 100%  100%   Weight:      Height:        Intake/Output Summary (Last 24 hours) at 06/15/2023 0959 Last data filed at 06/15/2023 0535 Gross per 24 hour  Intake 360 ml  Output --  Net 360 ml   Filed Weights   06/14/23 0842  Weight: 68 kg   Examination:  General exam: Appears calm and comfortable, sitting up in chair, expressive aphasia, dense right hemiparesis unchanged Respiratory system: Clear to auscultation. Respiratory effort normal. Cardiovascular system: normal S1 & S2 heard. No JVD, murmurs, rubs, gallops or clicks. No pedal edema. Gastrointestinal system: Abdomen is nondistended, soft and nontender. No organomegaly or masses felt. Normal bowel sounds heard. Central nervous system: Alert and oriented. Aphasic, dense right hemiparesis unchanged.  Extremities: dense right hemiparesis Skin: No rashes, lesions or ulcers. Psychiatry: Judgement and insight UTD. Mood & affect UTD.   Data Reviewed: I have personally reviewed following labs and imaging studies  CBC: Recent Labs  Lab 06/14/23 0911  WBC 10.0  NEUTROABS 7.5  HGB 13.1  HCT 40.2  MCV 91.2  PLT 278    Basic Metabolic Panel: Recent Labs  Lab 06/14/23 0911 06/15/23 0514  NA 143 141  K 3.4* 3.6  CL 106 106  CO2 27 25  GLUCOSE 108* 157*  BUN 19 16  CREATININE 1.57* 1.49*  CALCIUM 9.2 8.6*  MG  --  2.0    CBG: Recent Labs  Lab 06/14/23 0849 06/14/23 2012 06/14/23 2359 06/15/23 0407 06/15/23 0732  GLUCAP 109* 189* 161* 153* 117*    No results found for this or any previous visit (from the  past 240 hour(s)).   Radiology Studies: ECHOCARDIOGRAM COMPLETE  Result Date: 06/14/2023    ECHOCARDIOGRAM REPORT   Patient Name:   Zachary Winters Date of Exam: 06/14/2023 Medical Rec #:  [id number]       Height:       73.0 in Accession #:    [id number]      Weight:       149.9 lb Date of Birth:  03/22/1981       BSA:          1.904 m Patient Age:    42 years        BP:           180/92 mmHg Patient Gender: M               HR:           77 bpm. Exam Location:  [facility] Procedure: 2D Echo, Cardiac Doppler and Color Doppler Indications:      Stroke I63.9  History:        Patient has no prior history of Echocardiogram examinations.                 Risk Factors:Hypertension, Diabetes, Dyslipidemia and Current                 Smoker.  Sonographer:    Bernard White RCS Referring Phys: [id number] Jaylie Neaves L Torell Minder IMPRESSIONS  1. Left ventricular ejection fraction, by estimation, is 60 to 65%. The left ventricle has normal function. The left ventricle has no regional wall motion abnormalities. There is moderate concentric left ventricular hypertrophy. Left ventricular diastolic parameters are consistent with Grade I diastolic dysfunction (impaired relaxation).  2. Right ventricular systolic function is normal. The right ventricular size is normal. Tricuspid regurgitation signal is inadequate for assessing PA pressure.  3. The mitral valve is grossly normal. Trivial mitral valve regurgitation.  4. The aortic valve is tricuspid. There is mild calcification of the aortic valve. Aortic valve regurgitation is not visualized. Aortic valve sclerosis is present, with no evidence of aortic valve stenosis. Aortic valve mean gradient measures 5.0 mmHg.  5. The inferior vena cava is normal in size with greater than 50% respiratory variability, suggesting right atrial pressure of 3 mmHg. Comparison(s): No prior Echocardiogram. FINDINGS  Left Ventricle: Left ventricular ejection fraction, by estimation, is 60 to 65%. The left ventricle  has normal function. The left ventricle has no regional wall motion abnormalities. Definity contrast agent was given IV to delineate the left ventricular  endocardial borders. The left ventricular internal cavity size was normal in size. There is moderate concentric left ventricular hypertrophy. Left ventricular diastolic parameters are consistent with Grade I diastolic dysfunction (impaired relaxation). Right Ventricle: The right ventricular size is normal. No increase in right ventricular wall thickness. Right ventricular systolic function is normal. Tricuspid regurgitation signal is inadequate for assessing PA pressure. Left Atrium: Left atrial size was normal in size. Right Atrium: Right atrial size was normal in size. Pericardium: There is no evidence of pericardial effusion. Mitral Valve: The mitral valve is grossly normal. Trivial mitral valve regurgitation. Tricuspid Valve: The tricuspid valve is grossly normal. Tricuspid valve regurgitation is trivial. Aortic Valve: The aortic valve is tricuspid. There is mild calcification of the aortic valve. Aortic valve regurgitation is not visualized. Aortic valve sclerosis is present, with no evidence of aortic valve stenosis. Aortic valve mean gradient measures 5.0 mmHg. Aortic valve peak gradient measures 12.7 mmHg. Aortic valve area, by VTI measures 1.55 cm. Pulmonic Valve: The pulmonic valve was grossly normal. Pulmonic valve regurgitation is trivial. Aorta: The aortic root is normal in size and structure. Venous: The inferior vena cava is normal in size with greater than 50% respiratory variability, suggesting right atrial pressure of 3 mmHg. IAS/Shunts: No atrial level shunt detected by color flow Doppler.  LEFT VENTRICLE PLAX 2D LVIDd:         4.40 cm   Diastology LVIDs:         3.20 cm   LV e' medial:    5.33 cm/s LV PW:         1.40 cm   LV E/e' medial:  10.6 LV IVS:        1.30 cm   LV e' lateral:   7.72 cm/s LVOT diam:     1.90 cm   LV E/e' lateral: 7.3 LV  SV:         43 LV SV Index:   22 LVOT Area:       2.84 cm  RIGHT VENTRICLE RV S prime:     11.40 cm/s TAPSE (M-mode): 2.1 cm LEFT ATRIUM             Index        RIGHT ATRIUM           Index LA diam:        3.00 cm 1.58 cm/m   RA Area:     12.50 cm LA Vol (A2C):   63.1 ml 33.15 ml/m  RA Volume:   30.80 ml  16.18 ml/m LA Vol (A4C):   36.0 ml 18.91 ml/m LA Biplane Vol: 48.1 ml 25.27 ml/m  AORTIC VALVE AV Area (Vmax):    1.45 cm AV Area (Vmean):   1.67 cm AV Area (VTI):     1.55 cm AV Vmax:           178.00 cm/s AV Vmean:          102.000 cm/s AV VTI:            0.277 m AV Peak Grad:      12.7 mmHg AV Mean Grad:      5.0 mmHg LVOT Vmax:         91.20 cm/s LVOT Vmean:        60.000 cm/s LVOT VTI:          0.151 m LVOT/AV VTI ratio: 0.55  AORTA Ao Root diam: 3.30 cm MITRAL VALVE MV Area (PHT): 3.37 cm    SHUNTS MV Decel Time: 225 msec    Systemic VTI:  0.15 m MV E velocity: 56.30 cm/s  Systemic Diam: 1.90 cm MV A velocity: 71.90 cm/s MV E/A ratio:  0.78 Samuel Mcdowell MD Electronically signed by Samuel Mcdowell MD Signature Date/Time: 06/14/2023/5:02:28 PM    Final    MR BRAIN WO CONTRAST  Result Date: 06/14/2023 CLINICAL DATA:  Neuro deficit with acute stroke suspected EXAM: MRI HEAD WITHOUT CONTRAST MRA HEAD WITHOUT CONTRAST TECHNIQUE: Multiplanar, multi-echo pulse sequences of the brain and surrounding structures were acquired without intravenous contrast. Angiographic images of the Circle of Willis were acquired using MRA technique without intravenous contrast. COMPARISON:  Head CT from earlier today FINDINGS: MRI HEAD FINDINGS Brain: Acute perforator infarct affecting the left basal ganglia with central blood products that is petechial hemorrhage when correlated with CT. No hydrocephalus or extra-axial collection. Negative for mass Vascular: Major flow voids are preserved. Skull and upper cervical spine: Normal marrow signal Sinuses/Orbits: Left mastoid opacification with negative nasopharynx. MRA HEAD  FINDINGS Anterior circulation: The left ICA is smaller than the right. This may be partially due to circle-of-Willis variation given the hypoplastic potential of left A1 segment, but may also be related to underfilling in the setting of ipsilateral stenoses. Advanced stenosis or short segment occlusion at the left M1 segment with poorly visualized downstream branch vessel flow. Atheromatous irregularity diffusely affects intracranial branches. No detected aneurysm. Posterior circulation: At least moderate stenosis of the non dominant left vertebral artery just before the PICA. The basilar is widely patent. Posterior cerebral arteries are smoothly contoured and widely patent. Negative for aneurysm These results will be called to the ordering clinician or representative by the Radiologist Assistant, and communication documented in the PACS or [facility] Dashboard. Prelim sent in epic chat. IMPRESSION: 1. Acute to subacute perforator infarct at the left basal ganglia and adjacent white matter with petechial hemorrhage. 2. Underline severe stenosis versus segmental occlusion at the left M1 segment. 3. The left ICA is smaller than   the right and neck arterial imaging is recommended to exclude proximal stenosis with underfilling. Electronically Signed   By: Jonathan  Watts M.D.   On: 06/14/2023 12:44   MR ANGIO HEAD WO CONTRAST  Result Date: 06/14/2023 CLINICAL DATA:  Neuro deficit with acute stroke suspected EXAM: MRI HEAD WITHOUT CONTRAST MRA HEAD WITHOUT CONTRAST TECHNIQUE: Multiplanar, multi-echo pulse sequences of the brain and surrounding structures were acquired without intravenous contrast. Angiographic images of the Circle of Willis were acquired using MRA technique without intravenous contrast. COMPARISON:  Head CT from earlier today FINDINGS: MRI HEAD FINDINGS Brain: Acute perforator infarct affecting the left basal ganglia with central blood products that is petechial hemorrhage when correlated with CT. No  hydrocephalus or extra-axial collection. Negative for mass Vascular: Major flow voids are preserved. Skull and upper cervical spine: Normal marrow signal Sinuses/Orbits: Left mastoid opacification with negative nasopharynx. MRA HEAD FINDINGS Anterior circulation: The left ICA is smaller than the right. This may be partially due to circle-of-Willis variation given the hypoplastic potential of left A1 segment, but may also be related to underfilling in the setting of ipsilateral stenoses. Advanced stenosis or short segment occlusion at the left M1 segment with poorly visualized downstream branch vessel flow. Atheromatous irregularity diffusely affects intracranial branches. No detected aneurysm. Posterior circulation: At least moderate stenosis of the non dominant left vertebral artery just before the PICA. The basilar is widely patent. Posterior cerebral arteries are smoothly contoured and widely patent. Negative for aneurysm These results will be called to the ordering clinician or representative by the Radiologist Assistant, and communication documented in the PACS or [facility] Dashboard. Prelim sent in epic chat. IMPRESSION: 1. Acute to subacute perforator infarct at the left basal ganglia and adjacent white matter with petechial hemorrhage. 2. Underline severe stenosis versus segmental occlusion at the left M1 segment. 3. The left ICA is smaller than the right and neck arterial imaging is recommended to exclude proximal stenosis with underfilling. Electronically Signed   By: Jonathan  Watts M.D.   On: 06/14/2023 12:44   US Carotid Bilateral (at [facility] and [facility] only)  Result Date: 06/14/2023 CLINICAL DATA:  42-year-old male with history of ischemic stroke. EXAM: BILATERAL CAROTID DUPLEX ULTRASOUND TECHNIQUE: Gray scale imaging, color Doppler and duplex ultrasound were performed of bilateral carotid and vertebral arteries in the neck. COMPARISON:  None Available. FINDINGS: Criteria: Quantification of carotid stenosis  is based on velocity parameters that correlate the residual internal carotid diameter with NASCET-based stenosis levels, using the diameter of the distal internal carotid lumen as the denominator for stenosis measurement. The following velocity measurements were obtained: RIGHT ICA: Peak systolic velocity 83 cm/sec, End diastolic velocity 25 cm/sec CCA: Peak systolic velocity 98 cm/sec SYSTOLIC ICA/CCA RATIO:  0.8 ECA: Peak systolic velocity 85 cm/sec LEFT ICA: Peak systolic velocity 42 cm/sec, End diastolic velocity 16 cm/sec CCA: 77 cm/sec SYSTOLIC ICA/CCA RATIO:  0.5 ECA: 89 cm/sec RIGHT CAROTID ARTERY: No atherosclerotic plaque formation. No significant tortuosity. Pulsus bisferians is noted, low resistance waveforms. RIGHT VERTEBRAL ARTERY:  Antegrade flow. LEFT CAROTID ARTERY: No atherosclerotic plaque formation. No significant tortuosity. Pulsus bisferians is noted, low resistance waveforms. LEFT VERTEBRAL ARTERY:  Antegrade flow. Upper extremity non-invasive blood pressures: Not obtained. IMPRESSION: 1. Right carotid artery system: Patent without significant atherosclerotic plaque formation. 2. Left carotid artery system: Patent without significant atherosclerotic plaque formation. 3.  Vertebral artery system: Patent with antegrade flow bilaterally. 4. 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 Correlate with recently obtained echocardiogram. Dylan Suttle, MD [facility] Electronically Signed   By: Dylan  Suttle M.D.   On: 06/14/2023 12:37   CT HEAD WO CONTRAST  Result Date: 06/14/2023 CLINICAL DATA:  Right-sided weakness and facial droop. EXAM: CT HEAD WITHOUT CONTRAST TECHNIQUE: Contiguous axial images were obtained from the base of the skull through the vertex without intravenous contrast. RADIATION DOSE REDUCTION: This exam was performed according to the departmental  dose-optimization program which includes automated exposure control, adjustment of the mA and/or kV according to patient size and/or use of iterative reconstruction technique. COMPARISON:  CT head 03/14/2021. FINDINGS: Brain: There is hypodensity in the left internal capsule, lentiform nucleus, external capsule, and corona radiata consistent with subacute infarct. There is likely petechial hemorrhage within the infarct (2-17). There is mild edema with partial effacement of the body of the left lateral ventricle but no midline shift. There is no other evidence of acute intracranial hemorrhage or extra-axial fluid collection. Background parenchymal volume is normal. The ventricles are otherwise normal in size. The pituitary and suprasellar region are normal. There is no solid mass lesion. Vascular: There is calcification of the bilateral carotid siphons. Skull: Normal. Negative for fracture or focal lesion. Sinuses/Orbits: The paranasal sinuses are clear. There is a remote left orbital floor fracture. The globes and orbits are otherwise unremarkable. Other: The mastoid air cells and middle ear cavities are clear. IMPRESSION: Moderate-sized subacute infarct in the left basal ganglia/corona radiata with probable petechial hemorrhage. Mild regional mass effect but no midline shift. Critical Value/emergent results were called by telephone at the time of interpretation on 06/14/2023 at 9:53 am to provider SCOTT GOLDSTON , who verbally acknowledged these results. Electronically Signed   By: Peter  Noone M.D.   On: 06/14/2023 09:53    Scheduled Meds:   stroke: early stages of recovery book   Does not apply Once   aspirin EC  81 mg Oral Daily   atorvastatin  80 mg Oral QPM   clopidogrel  75 mg Oral Daily   enoxaparin (LOVENOX) injection  40 mg Subcutaneous Daily   insulin aspart  0-9 Units Subcutaneous Q4H   Vitamin D (Ergocalciferol)  50,000 Units Oral Q7 days   Continuous Infusions:  sodium chloride 75 mL/hr at  06/15/23 0204     LOS: 1 day   Time spent: 44 mins  Masato Pettie, MD How to contact the [facility] Attending or Consulting provider 7A - 7P or covering provider during after hours 7P -7A, for this patient?  Check the care team in [facility] and look for a) attending/consulting [facility] provider listed and b) the [facility] team listed Log into [email or web address] and use [place]'s universal password to access. If you do not have the password, please contact the hospital operator. Locate the [facility] provider you are looking for under [facility] and page to a number that you can be directly reached. If you still have difficulty reaching the provider, please page the DOC (Director on Call) for the Hospitalists listed on amion for assistance.  06/15/2023, 9:59 AM    

## 2023-06-15 NOTE — Progress Notes (Signed)
Patient blood pressure 158/105. MD on call notified. No new orders. Subsequently, rechecked blood pressure now 169/95. Prn medication given for headache. Plan of care ongoing.

## 2023-06-15 NOTE — Evaluation (Signed)
Occupational Therapy Evaluation Patient Details Name: Zachary Winters MRN: [id number] DOB: June 09, 1981 Today's Date: 06/15/2023   History of Present Illness Zachary Winters is a 42 year old gentleman with poorly controlled diabetes mellitus on insulin, chronic daily smoker, hypertension, history of marijuana use, diabetic peripheral neuropathy, hyperlipidemia was sent to the emergency department by [facility] with right-sided weakness right-sided facial droop and difficulty speaking.  He was last known to be well 2 days ago at 6 AM when his sister noticed he went out to smoke on ring camera.  He apparently lives with his sister.  Sister noticed the patient lying on the floor having a difficult time communicating only able to nod yes and no.  She noticed that for the last 2 days he has been lying around and sleeping. (per MD)   Clinical Impression   Pt agreeable to OT and PT co-evaluation. Pt was independent at baseline and reporting having family assist 24/7 if needed. Pt using mostly nodding to yes and no questions today due to expressive communication deficits. Pt demonstrates no active movement of R UE with mild tone in shoulder and elbow. Pt also demonstrates R visual field cut during brief assessment. Mod to max A needed for transfers today with and without RW. Pt is very unsteady in standing. Pt was left in the chair with call bell within reach. Pt will benefit from continued OT in the hospital and recommended venue below to increase strength, balance, and endurance for safe ADL's.         Recommendations for follow up therapy are one component of a multi-disciplinary discharge planning process, led by the attending physician.  Recommendations may be updated based on patient status, additional functional criteria and insurance authorization.   Assistance Recommended at Discharge Frequent or constant Supervision/Assistance  Patient can return home with the following A lot of help with walking and/or  transfers;A lot of help with bathing/dressing/bathroom;Assistance with cooking/housework;Assistance with feeding;Assist for transportation;Direct supervision/assist for medications management;Help with stairs or ramp for entrance    Functional Status Assessment  Patient has had a recent decline in their functional status and demonstrates the ability to make significant improvements in function in a reasonable and predictable amount of time.  Equipment Recommendations  None recommended by OT           Precautions / Restrictions Precautions Precautions: Fall Restrictions Weight Bearing Restrictions: No      Mobility Bed Mobility Overal bed mobility: Needs Assistance Bed Mobility: Supine to Sit     Supine to sit: Mod assist     General bed mobility comments: Assist to move R UE and LE to EOB. Slow movement.    Transfers Overall transfer level: Needs assistance Equipment used: Rolling walker (2 wheels) Transfers: Sit to/from Stand, Bed to chair/wheelchair/BSC Sit to Stand: Mod assist, Max assist     Step pivot transfers: Mod assist, Max assist     General transfer comment: Difficulty weight bearing; assist to manage RW due to lack of functional use of R UE.      Balance Overall balance assessment: Needs assistance Sitting-balance support: No upper extremity supported, Feet supported Sitting balance-Leahy Scale: Fair Sitting balance - Comments: poor to fair at EOB.   Standing balance support: During functional activity, Single extremity supported, Reliant on assistive device for balance Standing balance-Leahy Scale: Poor Standing balance comment: using RW  ADL either performed or assessed with clinical judgement   ADL Overall ADL's : Needs assistance/impaired Eating/Feeding: Moderate assistance;Minimal assistance;Sitting   Grooming: Moderate assistance;Sitting   Upper Body Bathing: Moderate assistance;Sitting   Lower Body  Bathing: Maximal assistance;Sitting/lateral leans;Bed level   Upper Body Dressing : Moderate assistance;Sitting   Lower Body Dressing: Maximal assistance;Sitting/lateral leans   Toilet Transfer: Moderate assistance;Maximal assistance;Stand-pivot;Rolling walker (2 wheels) Toilet Transfer Details (indicate cue type and reason): Simulated via EOB to chair transfer and sit to stand from chair. Toileting- Clothing Manipulation and Hygiene: Total assistance;Maximal assistance;Bed level   Tub/ Shower Transfer: Moderate assistance;Maximal assistance;Rolling walker (2 wheels)   Functional mobility during ADLs: Maximal assistance;Moderate assistance;Rolling walker (2 wheels) General ADL Comments: Pt able to ambulate one step forward from chair with RW.     Vision Baseline Vision/History:  (Reported some visual deficit; unsure of what exactly.) Ability to See in Adequate Light: 1 Impaired Patient Visual Report: No change from baseline (reported no change.) Vision Assessment?: Yes Eye Alignment: Within Functional Limits Tracking/Visual Pursuits: Able to track stimulus in all quads without difficulty Convergence: Within functional limits Visual Fields: Right visual field deficit;Other (comment) (Pt appears to have  R visual field cut.)                Pertinent Vitals/Pain Pain Assessment Pain Assessment: No/denies pain     Hand Dominance Right   Extremity/Trunk Assessment Upper Extremity Assessment Upper Extremity Assessment: RUE deficits/detail RUE Deficits / Details: Minimal tone in R shoulder and elbow. Flaccid with no active movement of any kind in R UE. RUE Sensation: WNL RUE Coordination: decreased fine motor;decreased gross motor   Lower Extremity Assessment Lower Extremity Assessment: Defer to PT evaluation   Cervical / Trunk Assessment Cervical / Trunk Assessment: Normal   Communication Communication Communication: Expressive difficulties   Cognition  Arousal/Alertness: Awake/alert Behavior During Therapy: WFL for tasks assessed/performed Overall Cognitive Status: Within Functional Limits for tasks assessed                                 General Comments: Able to follow commands well. Able to answer yes and no questions for history.                      Home Living Family/patient expects to be discharged to:: Private residence Living Arrangements: Other (Comment) (Family) Available Help at Discharge: Family;Available 24 hours/day Type of Home: House Home Access: Level entry     Home Layout: Able to live on main level with bedroom/bathroom     Bathroom Shower/Tub: Producer, television/film/video: Standard Bathroom Accessibility: Yes How Accessible: Accessible via walker;Accessible via wheelchair Home Equipment: None          Prior Functioning/Environment Prior Level of Function : Independent/Modified Independent             Mobility Comments: Community ambulator without AD. ADLs Comments: Independent        OT Problem List: Decreased strength;Decreased range of motion;Decreased activity tolerance;Impaired balance (sitting and/or standing);Impaired vision/perception;Decreased coordination;Decreased safety awareness;Decreased knowledge of use of DME or AE;Impaired UE functional use      OT Treatment/Interventions: Self-care/ADL training;Therapeutic exercise;Neuromuscular education;DME and/or AE instruction;Therapeutic activities;Visual/perceptual remediation/compensation;Patient/family education;Balance training    OT Goals(Current goals can be found in the care plan section) Acute Rehab OT Goals Patient Stated Goal: Agreeable to rehab to improve function. OT Goal Formulation: With patient Time For Goal Achievement:  06/29/23 Potential to Achieve Goals: Good  OT Frequency: Min 2X/week    Co-evaluation PT/OT/SLP Co-Evaluation/Treatment: Yes Reason for Co-Treatment: Complexity of the  patient's impairments (multi-system involvement)   OT goals addressed during session: ADL's and self-care                       End of Session Equipment Utilized During Treatment: Rolling walker (2 wheels) Nurse Communication: Mobility status  Activity Tolerance: Patient tolerated treatment well Patient left: in chair;with call bell/phone within reach  OT Visit Diagnosis: Unsteadiness on feet (R26.81);Other abnormalities of gait and mobility (R26.89);Muscle weakness (generalized) (M62.81);Other symptoms and signs involving the nervous system (R29.898);Cognitive communication deficit (R41.841);Hemiplegia and hemiparesis Symptoms and signs involving cognitive functions: Cerebral infarction Hemiplegia - Right/Left: Right Hemiplegia - dominant/non-dominant: Dominant Hemiplegia - caused by: Cerebral infarction                Time: 1610-9604 OT Time Calculation (min): 20 min Charges:  OT General Charges $OT Visit: 1 Visit OT Evaluation $OT Eval Low Complexity: 1 Low  Christophor Eick OT, MOT  Danie Chandler 06/15/2023, 9:36 AM

## 2023-06-15 NOTE — Inpatient Diabetes Management (Signed)
Inpatient Diabetes Program Recommendations  AACE/ADA: New Consensus Statement on Inpatient Glycemic Control (2015)  Target Ranges:  Prepandial:   less than 140 mg/dL      Peak postprandial:   less than 180 mg/dL (1-2 hours)      Critically ill patients:  140 - 180 mg/dL   Lab Results  Component Value Date   GLUCAP 117 (H) 06/15/2023   HGBA1C 11.7 (H) 06/14/2023    Review of Glycemic Control  Latest Reference Range & Units 06/14/23 20:12 06/14/23 23:59 06/15/23 04:07 06/15/23 07:32  Glucose-Capillary 70 - 99 mg/dL 161 (H) 096 (H) 045 (H) 117 (H)  (H): Data is abnormally high Diabetes history: Type 2 DM Outpatient Diabetes medications: Lantus 30 units QD Current orders for Inpatient glycemic control: Novolog 0-9 units Q4H  Inpatient Diabetes Program Recommendations:    Consider adding Semglee 8 units every day.  Spoke with patient's father by phone as DM coordinator working at alternate campus. Father reports his medications but cannot validate missed dosing. Attempted to communicate, however, aphasia presenting challenges during conversation.   Reviewed patient's current A1c of 11.7%. Explained what a A1c is and what it measures. Also reviewed goal A1c with patient, importance of good glucose control @ home, and blood sugar goals. Patient's father knowledgeable about how to give injections and states, "I am able to step in daily to provide injections if needed."  No further questions at this time.   Thanks, Lujean Rave, MSN, RNC-OB Diabetes Coordinator [phone] (8a-5p)

## 2023-06-15 NOTE — TOC CM/SW Note (Addendum)
Transition of Care [facility]) - Inpatient Brief Assessment   Patient Details  Name: Zachary Winters MRN: [id number] Date of Birth: 17-Jan-1981  Transition of Care [facility]) CM/SW Contact:    Elliot Gault, LCSW Phone Number: 06/15/2023, 9:59 AM   Clinical Narrative:  PT/OT recommending CIR. Awaiting their evaluation.  Transition of Care Department [facility]) has reviewed patient and no TOC needs have been identified at this time. We will continue to monitor patient advancement through interdisciplinary progression rounds. If new patient transition needs arise, please place a TOC consult.   Transition of Care Asessment: Insurance and Status: Insurance coverage has been reviewed Patient has primary care physician: Yes Home environment has been reviewed: from home with family Prior level of function:: independent Prior/Current Home Services: No current home services Social Determinants of Health Reivew: SDOH reviewed no interventions necessary Readmission risk has been reviewed: Yes Transition of care needs: no transition of care needs at this time

## 2023-06-15 NOTE — Plan of Care (Signed)
  Problem: Acute Rehab PT Goals(only PT should resolve) Goal: Pt Will Go Supine/Side To Sit Outcome: Progressing Flowsheets (Taken 06/15/2023 1231) Pt will go Supine/Side to Sit:  with minimal assist  with moderate assist Goal: Patient Will Transfer Sit To/From Stand Outcome: Progressing Flowsheets (Taken 06/15/2023 1231) Patient will transfer sit to/from stand:  with minimal assist  with moderate assist Goal: Pt Will Transfer Bed To Chair/Chair To Bed Outcome: Progressing Flowsheets (Taken 06/15/2023 1231) Pt will Transfer Bed to Chair/Chair to Bed:  with min assist  with mod assist Goal: Pt Will Ambulate Outcome: Progressing Flowsheets (Taken 06/15/2023 1231) Pt will Ambulate:  15 feet  with moderate assist  with rolling walker Note: Hemi-walker   12:31 PM, 06/15/23 Zachary Winters, MPT Physical Therapist with [facility] [phone] office [phone] mobile phone

## 2023-06-15 NOTE — Progress Notes (Signed)
  Inpatient Rehabilitation Admissions Coordinator   I contacted patient's sister by phone for rehab assessment. We discussed goals and expectations of a possible CIR admit. Patient lives with her, her spouse and 40 year old. She will discuss with family caregiver supports that they can provide for sister works. I will follow up tomorrow to assist in determining rehab venue options of CIR vs SNF. Please call me with any questions.   Ottie Glazier, RN, MSN Rehab Admissions Coordinator [phone]

## 2023-06-15 NOTE — Plan of Care (Signed)
  Problem: Acute Rehab OT Goals (only OT should resolve) Goal: Pt. Will Perform Eating Flowsheets (Taken 06/15/2023 [phone]) Pt Will Perform Eating:  with min guard assist  with adaptive utensils  sitting Goal: Pt. Will Perform Grooming Flowsheets (Taken 06/15/2023 [phone]) Pt Will Perform Grooming:  with min assist  sitting Goal: Pt. Will Perform Upper Body Bathing Flowsheets (Taken 06/15/2023 0938) Pt Will Perform Upper Body Bathing:  with supervision  sitting Goal: Pt. Will Perform Upper Body Dressing Flowsheets (Taken 06/15/2023 [phone]) Pt Will Perform Upper Body Dressing:  with supervision  sitting Goal: Pt. Will Perform Lower Body Dressing Flowsheets (Taken 06/15/2023 [phone]) Pt Will Perform Lower Body Dressing:  with mod assist  bed level Goal: Pt. Will Transfer To Toilet Flowsheets (Taken 06/15/2023 [phone]) Pt Will Transfer to Toilet:  with min guard assist  stand pivot transfer Goal: Pt. Will Perform Toileting-Clothing Manipulation Flowsheets (Taken 06/15/2023 [phone]) Pt Will Perform Toileting - Clothing Manipulation and hygiene:  with min guard assist  sitting/lateral leans Goal: Pt/Caregiver Will Perform Home Exercise Program Flowsheets (Taken 06/15/2023 [phone]) Pt/caregiver will Perform Home Exercise Program:  Increased ROM  Increased strength  Right Upper extremity  With minimal assist Goal: OT Additional ADL Goal #1 Flowsheets (Taken 06/15/2023 0938) Additional ADL Goal #1: Pt will demonstrate improved visual processing by locating objects in R field of view with set up assist 50% of the time.  Aryav Wimberly OT, MOT

## 2023-06-15 NOTE — Evaluation (Signed)
Speech Language Pathology Evaluation Patient Details Name: Zachary Winters MRN: [id number] DOB: 08-28-1981 Today's Date: 06/15/2023 Time: 4782-9562 SLP Time Calculation (min) (ACUTE ONLY): 51 min  Problem List:  Patient Active Problem List   Diagnosis Date Noted   Acute stroke due to ischemia (HCC) 06/14/2023   Dysphonia 06/14/2023   Aphasia 06/14/2023   Tobacco use disorder 06/14/2023   Recreational drug use 06/14/2023   Diabetic neuropathy (HCC) 06/14/2023   Dehydration    Lactic acidosis 03/14/2021   SIRS (systemic inflammatory response syndrome) (HCC) 03/14/2021   Abdominal pain with vomiting 03/14/2021   Somnolence 03/14/2021   Intractable nausea and vomiting    Type 2 diabetes mellitus with diabetic neuropathy (HCC) 01/01/2020   Hyperlipidemia associated with type 2 diabetes mellitus (HCC) 01/01/2020   Hypertension associated with diabetes (HCC) 10/31/2019   Essential hypertension 10/31/2019   Past Medical History:  Past Medical History:  Diagnosis Date   Diabetes mellitus without complication (HCC)    Past Surgical History: History reviewed. No pertinent surgical history. HPI:  Zachary Winters is a 42 year old gentleman with poorly controlled diabetes mellitus on insulin, chronic daily smoker, hypertension, history of marijuana use, diabetic peripheral neuropathy, hyperlipidemia was sent to the emergency department by [facility] with right-sided weakness right-sided facial droop and difficulty speaking.  He was last known to be well 2 days ago at 6 AM when his sister noticed he went out to smoke on ring camera.  He apparently lives with his sister.  Sister noticed the patient lying on the floor having a difficult time communicating only able to nod yes and no.  She noticed that for the last 2 days he has been lying around and sleeping. He was seen in the ED and noted to have a significant aphasia and right-sided hemiparesis.  He was sent for CT of the head with findings of  moderate-sized subacute infarct in the left basal ganglia/corona radiata with probable petechial hemorrhage.   Assessment / Plan / Recommendation Clinical Impression  Speech and language evaluation completed while Pt was sitting upright in bed. 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 Pt verbally responded to SLP initially saying "hey" which is an improvement from yesterday (no vocalization during BSE). Pt did produce his name with a model with decreased articulatory precision sounding like "eye-ler" very slowly and making each syllable almost a separate word. Pt named 0/10 objects and generated words with sentence completion cue with 0/10. Pt answered y/n (gestures head nod or shaking head) biographical information with 75% accuracy; increasing complexity note decrease in accuracy. When presented with a numbers chart Pt counted up to 10 with noted poor articulation of each number often not pronouncing the consonants. Note some perseveration of speech after a production of a successful word. Communication board was successful and Pt identified pictures when asked to point to pictures with 80% accuracy. Pt was very willing and motivated during my evaluation and demonstrates good rehab potential. Recommend the venue below for more in depth speech and language evaluation and treatment as indicated. ST will continue to follow acutely    SLP Assessment  SLP Recommendation/Assessment: Patient needs continued Speech Lanaguage Pathology Services SLP Visit Diagnosis: Dysphagia, oropharyngeal phase (R13.12);Aphasia (R47.01);Apraxia (R48.2)    Recommendations for follow up therapy are one component of a multi-disciplinary discharge planning process, led by the attending physician.  Recommendations may be updated based on patient status, additional functional criteria and insurance authorization.    Follow Up  Recommendations  Acute inpatient rehab (3hours/day)     Assistance Recommended at Discharge     Functional Status Assessment    Frequency and Duration min 2x/week  1 week      SLP Evaluation Cognition  Overall Cognitive Status: Difficult to assess Arousal/Alertness: Awake/alert Orientation Level: Oriented to person;Oriented to place;Oriented to situation       Comprehension  Auditory Comprehension Overall Auditory Comprehension: Impaired Yes/No Questions: Impaired Basic Biographical Questions: 76-100% accurate Commands: Impaired One Step Basic Commands: 75-100% accurate Two Step Basic Commands: 75-100% accurate Multistep Basic Commands: 25-49% accurate    Expression Expression Primary Mode of Expression: Nonverbal - gestures Verbal Expression Overall Verbal Expression: Impaired Initiation: Impaired Automatic Speech: Name;Counting;Singing Level of Generative/Spontaneous Verbalization: Word Repetition: Impaired Level of Impairment: Word level Naming: Impairment Responsive: 0-25% accurate Confrontation: Impaired Convergent: 0-24% accurate Divergent: 0-24% accurate Verbal Errors: Language of confusion;Perseveration;Neologisms Pragmatics: No impairment Effective Techniques: Written cues Non-Verbal Means of Communication: Gestures;Writing;Communication board Written Expression Dominant Hand: Right   Oral / Motor  Oral Motor/Sensory Function Overall Oral Motor/Sensory Function: Moderate impairment Facial ROM: Reduced right Facial Symmetry: Abnormal symmetry right Facial Strength: Reduced right Facial Sensation: Reduced right Motor Speech Overall Motor Speech: Impaired Respiration: Within functional limits Articulation: Impaired Level of Impairment: Word Intelligibility: Intelligibility reduced Word: 50-74% accurate Phrase: 0-24% accurate Motor Planning: Impaired Level of Impairment: Word Motor Speech Errors: Consistent;Aware Effective Techniques: Slow rate;Pause (repetition)            Shametra Cumberland H. Romie Levee,  CCC-SLP Speech Language Pathologist   Georgetta Haber 06/15/2023, 8:06 AM

## 2023-06-15 NOTE — Progress Notes (Signed)
   [facility] has been requested to perform a transesophageal echocardiogram on Merleen Milliner for CVA.  After careful review of history and examination, the risks and benefits of transesophageal echocardiogram have been explained including risks of esophageal damage, perforation (1:10,000 risk), bleeding, pharyngeal hematoma as well as other potential complications associated with conscious sedation including aspiration, arrhythmia, respiratory failure and death. Alternatives to treatment were discussed, questions were answered. Patient is willing to proceed. A&Ox3. He has expressive aphasia but is able to answer questions by nodding or shaking his head. In agreement to proceed. Offered to review with patient's family as well but he declined.   BP has been elevated but no requirement for pressor support. Labs show Hgb at 13.1 and platelets at 278. Electrolytes WNL. TEE scheduled for 06/16/2023 due to scheduling availability.   Ellsworth Lennox, PA-C  06/15/2023 9:09 AM

## 2023-06-16 ENCOUNTER — Inpatient Hospital Stay (HOSPITAL_COMMUNITY): Payer: Medicaid Other

## 2023-06-16 ENCOUNTER — Encounter (HOSPITAL_COMMUNITY): Payer: Self-pay | Admitting: Family Medicine

## 2023-06-16 ENCOUNTER — Inpatient Hospital Stay (HOSPITAL_COMMUNITY): Payer: Medicaid Other | Admitting: Certified Registered Nurse Anesthetist

## 2023-06-16 ENCOUNTER — Encounter (HOSPITAL_COMMUNITY)
Admission: EM | Disposition: A | Discharge status: Rehabilitation Facility | Payer: Self-pay | Source: Home / Self Care | Attending: Internal Medicine

## 2023-06-16 DIAGNOSIS — I10 Essential (primary) hypertension: Secondary | ICD-10-CM | POA: Diagnosis not present

## 2023-06-16 DIAGNOSIS — I639 Cerebral infarction, unspecified: Secondary | ICD-10-CM | POA: Diagnosis not present

## 2023-06-16 DIAGNOSIS — I6389 Other cerebral infarction: Secondary | ICD-10-CM

## 2023-06-16 DIAGNOSIS — R4701 Aphasia: Secondary | ICD-10-CM | POA: Diagnosis not present

## 2023-06-16 DIAGNOSIS — E119 Type 2 diabetes mellitus without complications: Secondary | ICD-10-CM | POA: Diagnosis not present

## 2023-06-16 DIAGNOSIS — I679 Cerebrovascular disease, unspecified: Secondary | ICD-10-CM

## 2023-06-16 DIAGNOSIS — Z7984 Long term (current) use of oral hypoglycemic drugs: Secondary | ICD-10-CM

## 2023-06-16 DIAGNOSIS — F17210 Nicotine dependence, cigarettes, uncomplicated: Secondary | ICD-10-CM

## 2023-06-16 DIAGNOSIS — F17200 Nicotine dependence, unspecified, uncomplicated: Secondary | ICD-10-CM | POA: Diagnosis not present

## 2023-06-16 DIAGNOSIS — E559 Vitamin D deficiency, unspecified: Secondary | ICD-10-CM | POA: Diagnosis not present

## 2023-06-16 DIAGNOSIS — I361 Nonrheumatic tricuspid (valve) insufficiency: Secondary | ICD-10-CM

## 2023-06-16 HISTORY — PX: TEE WITHOUT CARDIOVERSION: SHX5443

## 2023-06-16 LAB — ECHO TEE

## 2023-06-16 LAB — GLUCOSE, CAPILLARY
Glucose-Capillary: 195 mg/dL — ABNORMAL HIGH (ref 70–99)
Glucose-Capillary: 201 mg/dL — ABNORMAL HIGH (ref 70–99)
Glucose-Capillary: 194 mg/dL — ABNORMAL HIGH (ref 70–99)
Glucose-Capillary: 213 mg/dL — ABNORMAL HIGH (ref 70–99)
Glucose-Capillary: 237 mg/dL — ABNORMAL HIGH (ref 70–99)

## 2023-06-16 LAB — BASIC METABOLIC PANEL
Anion gap: 8 (ref 5–15)
BUN: 14 mg/dL (ref 6–20)
CO2: 24 mmol/L (ref 22–32)
Calcium: 8.6 mg/dL — ABNORMAL LOW (ref 8.9–10.3)
Chloride: 104 mmol/L (ref 98–111)
Creatinine, Ser: 1.45 mg/dL — ABNORMAL HIGH (ref 0.61–1.24)
GFR, Estimated: >60 mL/min (ref >60)
Glucose, Bld: 192 mg/dL — ABNORMAL HIGH (ref 70–99)
Potassium: 3.8 mmol/L (ref 3.5–5.1)
Sodium: 136 mmol/L (ref 135–145)

## 2023-06-16 LAB — CBC
HCT: 34 % — ABNORMAL LOW (ref 39.0–52.0)
Hemoglobin: 11.1 g/dL — ABNORMAL LOW (ref 13.0–17.0)
MCH: 29.8 pg (ref 26.0–34.0)
MCHC: 32.6 g/dL (ref 30.0–36.0)
MCV: 91.4 fL (ref 80.0–100.0)
Platelets: 237 10*3/uL (ref 150–400)
RBC: 3.72 MIL/uL — ABNORMAL LOW (ref 4.22–5.81)
RDW: 12.4 % (ref 11.5–15.5)
WBC: 7.4 10*3/uL (ref 4.0–10.5)
nRBC: 0 % (ref 0.0–0.2)

## 2023-06-16 LAB — MAGNESIUM: Magnesium: 1.8 mg/dL (ref 1.7–2.4)

## 2023-06-16 SURGERY — ECHOCARDIOGRAM, TRANSESOPHAGEAL
Anesthesia: General

## 2023-06-16 MED ORDER — ORAL CARE MOUTH RINSE: 15.0000 mL | Freq: Once | OROMUCOSAL | Status: AC

## 2023-06-16 MED ORDER — PROPOFOL 500 MG/50ML IV EMUL
INTRAVENOUS | Status: AC
  Filled 2023-06-16: qty 100

## 2023-06-16 MED ORDER — METOPROLOL TARTRATE 25 MG PO TABS
25.0000 mg | ORAL_TABLET | Freq: Two times a day (BID) | ORAL | Status: DC
  Administered 2023-06-16 – 2023-06-19 (×5): 25 mg via ORAL
  Filled 2023-06-16 (×5): qty 1

## 2023-06-16 MED ORDER — INSULIN ASPART 100 UNIT/ML IJ SOLN: 4.0000 [IU] | Freq: Three times a day (TID) | INTRAMUSCULAR | Status: DC

## 2023-06-16 MED ORDER — METOPROLOL TARTRATE 25 MG PO TABS
12.5000 mg | ORAL_TABLET | Freq: Two times a day (BID) | ORAL | Status: DC
  Administered 2023-06-16: 12.5 mg via ORAL
  Filled 2023-06-16: qty 1

## 2023-06-16 MED ORDER — LACTATED RINGERS IV SOLN: INTRAVENOUS | Status: DC

## 2023-06-16 MED ORDER — BUTAMBEN-TETRACAINE-BENZOCAINE 2-2-14 % EX AERO
INHALATION_SPRAY | CUTANEOUS | Status: DC | PRN
  Administered 2023-06-16: 1 via TOPICAL

## 2023-06-16 MED ORDER — INSULIN GLARGINE-YFGN 100 UNIT/ML ~~LOC~~ SOLN
12.0000 [IU] | Freq: Every day | SUBCUTANEOUS | Status: DC
  Administered 2023-06-16: 12 [IU] via SUBCUTANEOUS
  Filled 2023-06-16 (×3): qty 0.12

## 2023-06-16 MED ORDER — PROPOFOL 10 MG/ML IV BOLUS
INTRAVENOUS | Status: DC | PRN
  Administered 2023-06-16 (×6): 50 mg via INTRAVENOUS

## 2023-06-16 MED ORDER — LOSARTAN POTASSIUM 50 MG PO TABS
25.0000 mg | ORAL_TABLET | Freq: Every day | ORAL | Status: DC
  Administered 2023-06-16 – 2023-06-19 (×4): 25 mg via ORAL
  Filled 2023-06-16 (×4): qty 1

## 2023-06-16 MED ORDER — PROPOFOL 500 MG/50ML IV EMUL
INTRAVENOUS | Status: DC | PRN
  Administered 2023-06-16: 125 ug/kg/min via INTRAVENOUS
  Administered 2023-06-16: 50 ug/kg/min via INTRAVENOUS

## 2023-06-16 MED ORDER — BUTAMBEN-TETRACAINE-BENZOCAINE 2-2-14 % EX AERO
INHALATION_SPRAY | CUTANEOUS | Status: AC
  Filled 2023-06-16: qty 5

## 2023-06-16 MED ORDER — VITAMIN D (ERGOCALCIFEROL) 1.25 MG (50000 UNIT) PO CAPS
50000.0000 [IU] | ORAL_CAPSULE | ORAL | Status: DC
  Filled 2023-06-16: qty 1

## 2023-06-16 MED ORDER — CHLORHEXIDINE GLUCONATE 0.12 % MT SOLN
15.0000 mL | Freq: Once | OROMUCOSAL | Status: AC
  Administered 2023-06-16: 15 mL via OROMUCOSAL

## 2023-06-16 NOTE — Progress Notes (Signed)
PROGRESS NOTE   Zachary Winters  HKV:[id number] DOB: [birth year] DOA: 06/14/2023 PCP: System, Provider Not In   Chief Complaint  Patient presents with   Weakness   Level of care: Telemetry  Brief Admission History:  42 year old gentleman with poorly controlled diabetes mellitus on insulin, chronic daily smoker, hypertension, history of marijuana use, diabetic peripheral neuropathy, hyperlipidemia was sent to the emergency department by [facility] with right-sided weakness right-sided facial droop and difficulty speaking.  He was last known to be well 2 days ago at 6 AM when his sister noticed he went out to smoke on ring camera.  He apparently lives with his sister.  Sister noticed the patient lying on the floor having a difficult time communicating only able to nod yes and no.  She noticed that for the last 2 days he has been lying around and sleeping.  He was seen in the ED and noted to have a significant aphasia and right-sided hemiparesis.  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  His blood glucose was 109.  Code Stroke was not activated due to being 48 hours since last known well time.  Pt is being admitted into the hospital for further work up and treatment of acute CVA.     Assessment and Plan:  Acute CVA of left basal ganglia  -Pt presented with a dense right hemiparesis and aphasia that is unchanged -pt presented 2 days after last known well time  -MRI brain with findings of acute to subacute perforator infarct at the left basal ganglia and adjacent white matter with petechial hemorrhage. 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 -admitted for full stroke work up and inpatient neurology consultation  -SLP recommending Dys 2 diet for now. -neurology recommending starting asp 81 mg with plavix 75 mg daily for 90 days followed by aspirin  alone -started atorvastatin 80 mg daily  -2D echocardiogram and carotid dopplers ordered -TEE recommended by neurology.  I have reached out to cardiology and tentative plan for TEE on 6/21.  -PT/OT/SLP evaluation ordered and recommending CIR evaluation for rehab  -lipid panel - suboptimally controlled; better glycemic control will also improve lipids -NPO after midnight for TEE on 6/21.  -aspiration precautions -IV normal saline for now, reducing rate to 50  -we tried to keep SBP 160-180 for last 24 hours, now will start to lower to 140-160 over next few days per neuro recs -discussed with neurologist Dr Melynda Ripple today and she is ordering a CT Chest/Abd/Pelvis to rule out occult malignancy - CT was negative for signs of malignancy but also reported Right upper lobe predominant tree-in-bud pattern suggesting chronic inflammation atypical infection such as MAC. Will refer him out to pulmonary clinic at discharge for evaluation of this.  Also CT noted advanced coronary calcifications. Will refer him out to cardiology at discharge for surveillance.  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 -Continue to monitor CBG ACHS and provide SSI coverage as needed -try to avoid hypoglycemia -follow up A1c - 11.1% as evidence of poorly controlled disease  -follow up TSH - WNL CBG (last 3)  Recent Labs    06/16/23 0739 06/16/23 1004 06/16/23 1057  GLUCAP 195* 201* 194*  Adding prandial coverage now that he is eating Adding basal coverage     Essential Hypertension - next goal BP  is 140-160 over next few days per neuro - added losartan 25 mg, metoprolol 12.5 mg BID, amlodipine 5 mg    Hyperlipidemia - working on improving glycemic control - atorvastatin 80 mg daily ordered  Severe Vitamin D deficiency - Drisdol 50 k caps once weekly started    Tobacco Abuser - Pt advised to absolutely stop all tobacco use - nicotine  patch ordered if needed - counseling on tobacco cessation ongoing   Recreational Drug use - per history, will check a urine toxicology screen to see if he has been using recently - UDS positive for marijuana     Diabetic peripheral polyneuropathy - he hadn't been taking any home med for this recently - not currently complaining of symptoms - working on improving glycemic control     DVT prophylaxis: enoxaparin Code Status: Full  Family Communication: sister, grandmother 6/19 Disposition: Status is: Inpatient    Consultants:  Neurology  Cardiology for TEE PT/OT/SLP CIR for eval   Procedures:  TEE 06/16/23 with Dr Diona Browner  Antimicrobials:    Subjective: No change to his expressive aphasia and dense right hemiparesis, no worsening of symptoms  Objective: Vitals:   06/16/23 0945 06/16/23 1000 06/16/23 1015 06/16/23 1047  BP: 107/75  (!) 155/87 (!) 168/96  Pulse: 93 98 94 82  Resp: (!) 22 (!) 25 (!) 28   Temp: 98.3 F (36.8 C)   98.5 F (36.9 C)  TempSrc:    Oral  SpO2: 93% 95% 97% 95%  Weight:      Height:        Intake/Output Summary (Last 24 hours) at 06/16/2023 1111 Last data filed at 06/16/2023 0934 Gross per 24 hour  Intake 2693.89 ml  Output 1150 ml  Net 1543.89 ml   Filed Weights   06/14/23 0842 06/16/23 0828  Weight: 68 kg 68 kg   Examination:  General exam: Appears calm and comfortable, sitting up in chair, expressive aphasia, dense right hemiparesis unchanged, he is very alert and he can express understanding with nods and blinks Respiratory system: Clear to auscultation. Respiratory effort normal. Cardiovascular system: normal S1 & S2 heard. No JVD, murmurs, rubs, gallops or clicks. No pedal edema. Gastrointestinal system: Abdomen is nondistended, soft and nontender. No organomegaly or masses felt. Normal bowel sounds heard. Central nervous system: Alert and oriented. Aphasic, dense right hemiparesis unchanged.  Extremities: dense right  hemiparesis Skin: No rashes, lesions or ulcers. Psychiatry: Judgement and insight UTD. Mood & affect UTD.   Data Reviewed: I have personally reviewed following labs and imaging studies  CBC: Recent Labs  Lab 06/14/23 0911 06/16/23 0416  WBC 10.0 7.4  NEUTROABS 7.5  --   HGB 13.1 11.1*  HCT 40.2 34.0*  MCV 91.2 91.4  PLT 278 237    Basic Metabolic Panel: Recent Labs  Lab 06/14/23 0911 06/15/23 0514 06/16/23 0416  NA 143 141 136  K 3.4* 3.6 3.8  CL 106 106 104  CO2 27 25 24   GLUCOSE 108* 157* 192*  BUN 19 16 14   CREATININE 1.57* 1.49* 1.45*  CALCIUM 9.2 8.6* 8.6*  MG  --  2.0 1.8    CBG: Recent Labs  Lab 06/15/23 1631 06/15/23 2136 06/16/23 0739 06/16/23 1004 06/16/23 1057  GLUCAP 230* 184* 195* 201* 194*    No results found for this or any previous visit (from the past 240 hour(s)).   Radiology Studies: ECHO TEE  Result Date: 06/16/2023    TRANSESOPHOGEAL ECHO REPORT   Patient Name:  Merleen Milliner Date of Exam: 06/16/2023 Medical Rec #:  [id number]       Height:       73.0 in Accession #:    [id number]      Weight:       149.9 lb Date of Birth:  1981/01/09       BSA:          1.904 m Patient Age:    42 years        BP:           172/89 mmHg Patient Gender: M               HR:           134 bpm. [facility] Procedure: Transesophageal Echo, Cardiac Doppler, Limited Color Doppler and            Saline Contrast Bubble Study Indications:    Cerebral Infarction, unspecified I63.9  History:        Patient has prior history of Echocardiogram examinations, most                 recent 06/14/2023. Stroke; Risk Factors:Current Smoker, Diabetes,                 Hypertension and Dyslipidemia.  Sonographer:    Aron Baba Referring Phys: [id number] Ellsworth Lennox PROCEDURE: After discussion of the risks and benefits of a TEE, an informed consent was obtained from the patient. The transesophogeal probe was passed without difficulty through the esophogus of the  patient. Imaged were obtained with the patient in a left lateral decubitus position. Local oropharyngeal anesthetic was provided with Cetacaine. Sedation performed by different physician. The patient was monitored while under deep sedation. Anesthestetic sedation was provided intravenously by Anesthesiology: 475.1mg  of Propofol. The patient's vital signs; including heart rate, blood pressure, and oxygen saturation; remained stable throughout the procedure. The patient developed no complications during the procedure.  IMPRESSIONS  1. Left ventricular ejection fraction, by estimation, is 55 to 60%. The left ventricle has normal function. The left ventricle has no regional wall motion abnormalities. There is moderate concentric left ventricular hypertrophy.  2. Right ventricular systolic function is normal. The right ventricular size is normal.  3. No left atrial/left atrial appendage thrombus was detected. The LAA emptying velocity was 80 cm/s.  4. The mitral valve is grossly normal. Trivial mitral valve regurgitation.  5. The aortic valve is tricuspid. Aortic valve regurgitation is not visualized.  6. Agitated saline contrast bubble study was negative, with no evidence of any interatrial shunt. FINDINGS  Left Ventricle: Left ventricular ejection fraction, by estimation, is 55 to 60%. The left ventricle has normal function. The left ventricle has no regional wall motion abnormalities. The left ventricular internal cavity size was normal in size. There is  moderate concentric left ventricular hypertrophy. Right Ventricle: The right ventricular size is normal. No increase in right ventricular wall thickness. Right ventricular systolic function is normal. Left Atrium: Left atrial size was normal in size. No left atrial/left atrial appendage thrombus was detected. The LAA emptying velocity was 80 cm/s. Right Atrium: Right atrial size was normal in size. Pericardium: There is no evidence of pericardial effusion. Mitral  Valve: The mitral valve is grossly normal. Trivial mitral valve regurgitation. Tricuspid Valve: The tricuspid valve is grossly normal. Tricuspid valve regurgitation is mild. Aortic Valve: The aortic valve is tricuspid. Aortic valve regurgitation is not visualized. Pulmonic Valve: The pulmonic valve was grossly normal.  Pulmonic valve regurgitation is trivial. Aorta: The aortic root is normal in size and structure. There is minimal (Grade I) plaque involving the descending aorta. IAS/Shunts: No atrial level shunt detected by color flow Doppler. Agitated saline contrast was given intravenously to evaluate for intracardiac shunting. Agitated saline contrast bubble study was negative, with no evidence of any interatrial shunt. Additional Comments: Spectral Doppler performed. Nona Dell MD Electronically signed by Nona Dell MD Signature Date/Time: 06/16/2023/11:10:05 AM    Final    CT CHEST ABDOMEN PELVIS W CONTRAST  Result Date: 06/15/2023 CLINICAL DATA:  Weakness.  Evaluate for occult malignancy. EXAM: CT CHEST, ABDOMEN, AND PELVIS WITH CONTRAST TECHNIQUE: Multidetector CT imaging of the chest, abdomen and pelvis was performed following the standard protocol during bolus administration of intravenous contrast. RADIATION DOSE REDUCTION: This exam was performed according to the departmental dose-optimization program which includes automated exposure control, adjustment of the mA and/or kV according to patient size and/or use of iterative reconstruction technique. CONTRAST:  OMNIPAQUE IOHEXOL 300 MG/ML  SOLN COMPARISON:  CT scan 04/18/2021 FINDINGS: CT CHEST FINDINGS Cardiovascular: The heart is normal in size. No pericardial effusion. The aorta is normal in caliber. Significant age advanced three-vessel coronary artery calcifications. Mediastinum/Nodes: No mediastinal or hilar mass or lymphadenopathy. Small scattered lymph nodes are noted. The esophagus is grossly normal. Lungs/Pleura: No worrisome  pulmonary lesions or pulmonary nodules. No pleural effusions or pleural nodules. Right upper lobe predominant tree-in-bud pattern suggesting chronic inflammation atypical infection such as MAC. No focal airspace consolidation, pulmonary edema or pleural effusions. The central tracheobronchial tree is unremarkable. Musculoskeletal: Mild symmetric bilateral gynecomastia. No lytic or sclerotic bone lesions. CT ABDOMEN PELVIS FINDINGS Hepatobiliary: No focal hepatic lesions or intrahepatic biliary dilatation. Layering sludge or dependent gallstones in the gallbladder no common bile duct dilatation. Pancreas: Normal Spleen: Normal Adrenals/Urinary Tract: Normal Stomach/Bowel: No significant findings. Vascular/Lymphatic: The aorta is normal in caliber. No dissection. Age advanced atherosclerotic calcifications involving the aorta and iliac arteries. The branch vessels are patent. The major venous structures are patent. No mesenteric or retroperitoneal mass or adenopathy. Small scattered lymph nodes are noted. Reproductive: The prostate gland and seminal vesicles are unremarkable. Other: No pelvic mass or adenopathy. No free pelvic fluid collections. No inguinal mass or adenopathy. No abdominal wall hernia or subcutaneous lesions. Musculoskeletal: No significant bony findings. IMPRESSION: 1. No acute abdominal/pelvic findings, mass lesions or adenopathy. 2. Age advanced three-vessel coronary artery calcifications. 3. Right upper lobe predominant tree-in-bud pattern suggesting chronic inflammation atypical infection such as MAC. 4. Layering sludge or dependent gallstones in the gallbladder. 5. Age advanced atherosclerotic calcifications involving the aorta and iliac arteries. Aortic Atherosclerosis (ICD10-I70.0). Electronically Signed   By: Rudie Meyer M.D.   On: 06/15/2023 13:23   ECHOCARDIOGRAM COMPLETE  Result Date: 06/14/2023    ECHOCARDIOGRAM REPORT   Patient Name:   FALLON HOWERTER Date of Exam: 06/14/2023  Medical Rec #:  [id number]       Height:       73.0 in Accession #:    [id number]      Weight:       149.9 lb Date of Birth:  1981/03/09       BSA:          1.904 m Patient Age:    42 years        BP:           180/92 mmHg Patient Gender: M  HR:           77 bpm. [facility] Procedure: 2D Echo, Cardiac Doppler and Color Doppler Indications:    Stroke I63.9  History:        Patient has no prior history of Echocardiogram examinations.                 Risk Factors:Hypertension, Diabetes, Dyslipidemia and Current                 Smoker.  Sonographer:    Celesta Gentile RCS Referring Phys: [phone] Sylas Twombly L Holliday Sheaffer IMPRESSIONS  1. Left ventricular ejection fraction, by estimation, is 60 to 65%. The left ventricle has normal function. The left ventricle has no regional wall motion abnormalities. There is moderate concentric left ventricular hypertrophy. Left ventricular diastolic parameters are consistent with Grade I diastolic dysfunction (impaired relaxation).  2. Right ventricular systolic function is normal. The right ventricular size is normal. Tricuspid regurgitation signal is inadequate for assessing PA pressure.  3. The mitral valve is grossly normal. Trivial mitral valve regurgitation.  4. The aortic valve is tricuspid. There is mild calcification of the aortic valve. Aortic valve regurgitation is not visualized. Aortic valve sclerosis is present, with no evidence of aortic valve stenosis. Aortic valve mean gradient measures 5.0 mmHg.  5. The inferior vena cava is normal in size with greater than 50% respiratory variability, suggesting right atrial pressure of 3 mmHg. Comparison(s): No prior Echocardiogram. FINDINGS  Left Ventricle: Left ventricular ejection fraction, by estimation, is 60 to 65%. The left ventricle has normal function. The left ventricle has no regional wall motion abnormalities. Definity contrast agent was given IV to delineate the left ventricular  endocardial borders.  The left ventricular internal cavity size was normal in size. There is moderate concentric left ventricular hypertrophy. Left ventricular diastolic parameters are consistent with Grade I diastolic dysfunction (impaired relaxation). Right Ventricle: The right ventricular size is normal. No increase in right ventricular wall thickness. Right ventricular systolic function is normal. Tricuspid regurgitation signal is inadequate for assessing PA pressure. Left Atrium: Left atrial size was normal in size. Right Atrium: Right atrial size was normal in size. Pericardium: There is no evidence of pericardial effusion. Mitral Valve: The mitral valve is grossly normal. Trivial mitral valve regurgitation. Tricuspid Valve: The tricuspid valve is grossly normal. Tricuspid valve regurgitation is trivial. Aortic Valve: The aortic valve is tricuspid. There is mild calcification of the aortic valve. Aortic valve regurgitation is not visualized. Aortic valve sclerosis is present, with no evidence of aortic valve stenosis. Aortic valve mean gradient measures 5.0 mmHg. Aortic valve peak gradient measures 12.7 mmHg. Aortic valve area, by VTI measures 1.55 cm. Pulmonic Valve: The pulmonic valve was grossly normal. Pulmonic valve regurgitation is trivial. Aorta: The aortic root is normal in size and structure. Venous: The inferior vena cava is normal in size with greater than 50% respiratory variability, suggesting right atrial pressure of 3 mmHg. IAS/Shunts: No atrial level shunt detected by color flow Doppler.  LEFT VENTRICLE PLAX 2D LVIDd:         4.40 cm   Diastology LVIDs:         3.20 cm   LV e' medial:    5.33 cm/s LV PW:         1.40 cm   LV E/e' medial:  10.6 LV IVS:        1.30 cm   LV e' lateral:   7.72 cm/s LVOT diam:  1.90 cm   LV E/e' lateral: 7.3 LV SV:         43 LV SV Index:   22 LVOT Area:     2.84 cm  RIGHT VENTRICLE RV S prime:     11.40 cm/s TAPSE (M-mode): 2.1 cm LEFT ATRIUM             Index        RIGHT ATRIUM            Index LA diam:        3.00 cm 1.58 cm/m   RA Area:     12.50 cm LA Vol (A2C):   63.1 ml 33.15 ml/m  RA Volume:   30.80 ml  16.18 ml/m LA Vol (A4C):   36.0 ml 18.91 ml/m LA Biplane Vol: 48.1 ml 25.27 ml/m  AORTIC VALVE AV Area (Vmax):    1.45 cm AV Area (Vmean):   1.67 cm AV Area (VTI):     1.55 cm AV Vmax:           178.00 cm/s AV Vmean:          102.000 cm/s AV VTI:            0.277 m AV Peak Grad:      12.7 mmHg AV Mean Grad:      5.0 mmHg LVOT Vmax:         91.20 cm/s LVOT Vmean:        60.000 cm/s LVOT VTI:          0.151 m LVOT/AV VTI ratio: 0.55  AORTA Ao Root diam: 3.30 cm MITRAL VALVE MV Area (PHT): 3.37 cm    SHUNTS MV Decel Time: 225 msec    Systemic VTI:  0.15 m MV E velocity: 56.30 cm/s  Systemic Diam: 1.90 cm MV A velocity: 71.90 cm/s MV E/A ratio:  0.78 Nona Dell MD Electronically signed by Nona Dell MD Signature Date/Time: 06/14/2023/5:02:28 PM    Final    MR BRAIN WO CONTRAST  Result Date: 06/14/2023 CLINICAL DATA:  Neuro deficit with acute stroke suspected EXAM: MRI HEAD WITHOUT CONTRAST MRA HEAD WITHOUT CONTRAST TECHNIQUE: Multiplanar, multi-echo pulse sequences of the brain and surrounding structures were acquired without intravenous contrast. Angiographic images of the Circle of Willis were acquired using MRA technique without intravenous contrast. COMPARISON:  Head CT from earlier today FINDINGS: MRI HEAD FINDINGS Brain: Acute perforator infarct affecting the left basal ganglia with central blood products that is petechial hemorrhage when correlated with CT. No hydrocephalus or extra-axial collection. Negative for mass Vascular: Major flow voids are preserved. Skull and upper cervical spine: Normal marrow signal Sinuses/Orbits: Left mastoid opacification with negative nasopharynx. MRA HEAD FINDINGS Anterior circulation: The left ICA is smaller than the right. This may be partially due to circle-of-Willis variation given the hypoplastic potential of left A1  segment, but may also be related to underfilling in the setting of ipsilateral stenoses. Advanced stenosis or short segment occlusion at the left M1 segment with poorly visualized downstream branch vessel flow. Atheromatous irregularity diffusely affects intracranial branches. No detected aneurysm. Posterior circulation: At least moderate stenosis of the non dominant left vertebral artery just before the PICA. The basilar is widely patent. Posterior cerebral arteries are smoothly contoured and widely patent. Negative for aneurysm These results will be called to the ordering clinician or representative by the Radiologist Assistant, and communication documented in the PACS or [employer]. Prelim sent in epic chat. IMPRESSION: 1. Acute to subacute perforator  infarct at the left basal ganglia and adjacent white matter with petechial hemorrhage. 2. Underline severe stenosis versus segmental occlusion at the left M1 segment. 3. The left ICA is smaller than the right and neck arterial imaging is recommended to exclude proximal stenosis with underfilling. Electronically Signed   By: Tiburcio Pea M.D.   On: 06/14/2023 12:44   MR ANGIO HEAD WO CONTRAST  Result Date: 06/14/2023 CLINICAL DATA:  Neuro deficit with acute stroke suspected EXAM: MRI HEAD WITHOUT CONTRAST MRA HEAD WITHOUT CONTRAST TECHNIQUE: Multiplanar, multi-echo pulse sequences of the brain and surrounding structures were acquired without intravenous contrast. Angiographic images of the Circle of Willis were acquired using MRA technique without intravenous contrast. COMPARISON:  Head CT from earlier today FINDINGS: MRI HEAD FINDINGS Brain: Acute perforator infarct affecting the left basal ganglia with central blood products that is petechial hemorrhage when correlated with CT. No hydrocephalus or extra-axial collection. Negative for mass Vascular: Major flow voids are preserved. Skull and upper cervical spine: Normal marrow signal Sinuses/Orbits: Left  mastoid opacification with negative nasopharynx. MRA HEAD FINDINGS Anterior circulation: The left ICA is smaller than the right. This may be partially due to circle-of-Willis variation given the hypoplastic potential of left A1 segment, but may also be related to underfilling in the setting of ipsilateral stenoses. Advanced stenosis or short segment occlusion at the left M1 segment with poorly visualized downstream branch vessel flow. Atheromatous irregularity diffusely affects intracranial branches. No detected aneurysm. Posterior circulation: At least moderate stenosis of the non dominant left vertebral artery just before the PICA. The basilar is widely patent. Posterior cerebral arteries are smoothly contoured and widely patent. Negative for aneurysm These results will be called to the ordering clinician or representative by the Radiologist Assistant, and communication documented in the PACS or [employer]. Prelim sent in epic chat. IMPRESSION: 1. Acute to subacute perforator infarct at the left basal ganglia and adjacent white matter with petechial hemorrhage. 2. Underline severe stenosis versus segmental occlusion at the left M1 segment. 3. The left ICA is smaller than the right and neck arterial imaging is recommended to exclude proximal stenosis with underfilling. Electronically Signed   By: Tiburcio Pea M.D.   On: 06/14/2023 12:44   US Carotid Bilateral (at [facility] and [facility] only)  Result Date: 06/14/2023 CLINICAL DATA:  42 year old male with history of ischemic stroke. EXAM: BILATERAL CAROTID DUPLEX ULTRASOUND TECHNIQUE: Wallace Cullens scale imaging, color Doppler and duplex ultrasound were performed of bilateral carotid and vertebral arteries in the neck. COMPARISON:  None Available. FINDINGS: Criteria: Quantification of carotid stenosis is based on velocity parameters that correlate the residual internal carotid diameter with NASCET-based stenosis levels, using the diameter of the distal internal carotid  lumen as the denominator for stenosis measurement. The following velocity measurements were obtained: RIGHT ICA: Peak systolic velocity 83 cm/sec, End diastolic velocity 25 cm/sec CCA: Peak systolic velocity 98 cm/sec SYSTOLIC ICA/CCA RATIO:  0.8 ECA: Peak systolic velocity 85 cm/sec LEFT ICA: Peak systolic velocity 42 cm/sec, End diastolic velocity 16 cm/sec CCA: 77 cm/sec SYSTOLIC ICA/CCA RATIO:  0.5 ECA: 89 cm/sec RIGHT CAROTID ARTERY: No atherosclerotic plaque formation. No significant tortuosity. Pulsus bisferians is noted, low resistance waveforms. RIGHT VERTEBRAL ARTERY:  Antegrade flow. LEFT CAROTID ARTERY: No atherosclerotic plaque formation. No significant tortuosity. Pulsus bisferians is noted, low resistance waveforms. LEFT VERTEBRAL ARTERY:  Antegrade flow. Upper extremity non-invasive blood pressures: Not obtained. IMPRESSION: 1. Right carotid artery system: Patent without significant atherosclerotic plaque formation. 2. Left carotid artery system: Patent  without significant atherosclerotic plaque formation. 3.  Vertebral artery system: Patent with antegrade flow bilaterally. 4. 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 Correlate with recently obtained echocardiogram. Marliss Coots, MD [facility] Electronically Signed   By: Marliss Coots M.D.   On: 06/14/2023 12:37    Scheduled Meds:  amLODipine  5 mg Oral Daily   aspirin EC  81 mg Oral Daily   atorvastatin  80 mg Oral QPM   clopidogrel  75 mg Oral Daily   enoxaparin (LOVENOX) injection  40 mg Subcutaneous Daily   insulin aspart  0-9 Units Subcutaneous TID WC   insulin aspart  3 Units Subcutaneous TID WC   losartan  25 mg Oral Daily   metoprolol tartrate  12.5 mg Oral BID   Vitamin D (Ergocalciferol)  50,000 Units Oral Q7 days   Continuous Infusions:  sodium chloride 50 mL/hr at 06/15/23 2019      LOS: 2 days   Time spent: 42 mins  Gilbert Narain Laural Benes, MD How to contact the [facility] Attending or Consulting provider 7A - 7P or covering provider during after hours 7P -7A, for this patient?  Check the care team in [facility] and look for a) attending/consulting TRH provider listed and b) the [facility] team listed Log into [email or web address] and use [place]'s universal password to access. If you do not have the password, please contact the hospital operator. Locate the [facility] provider you are looking for under [facility] and page to a number that you can be directly reached. If you still have difficulty reaching the provider, please page the [facility] (Director on Call) for the Hospitalists listed on amion for assistance.  06/16/2023, 11:11 AM

## 2023-06-16 NOTE — Anesthesia Postprocedure Evaluation (Signed)
Anesthesia Post Note  Patient: Zachary Winters  Procedure(s) Performed: TRANSESOPHAGEAL ECHOCARDIOGRAM (TEE)  Patient location during evaluation: Phase II Anesthesia Type: General Level of consciousness: awake and alert and oriented Pain management: pain level controlled Vital Signs Assessment: post-procedure vital signs reviewed and stable Respiratory status: spontaneous breathing, nonlabored ventilation and respiratory function stable Cardiovascular status: blood pressure returned to baseline and stable Postop Assessment: no apparent nausea or vomiting Anesthetic complications: no  No notable events documented.   Last Vitals:  Vitals:   06/16/23 1047 06/16/23 1200  BP: (!) 168/96 (!) 179/100  Pulse: 82 100  Resp:  20  Temp: 36.9 C 37.5 C  SpO2: 95% 98%    Last Pain:  Vitals:   06/16/23 1200  TempSrc: Oral  PainSc:                  Shalina Norfolk C Armilda Vanderlinden

## 2023-06-16 NOTE — Progress Notes (Signed)
Attempted to see pt for OT treatment session. He is currently off the floor receiving medical procedure. Will f/u as schedule allows.   Lurena Joiner, OTR/L

## 2023-06-16 NOTE — Discharge Instructions (Signed)
?  Outpatient Substance Use Treatment Services  ? ? Health Outpatient  ?[facility] ?[address] ?[place], [place] [address]  ?[phone] ?Private insurance, Medicare A&B, and GCCN  ? ?[facility]  ?[address]  ?[place], [place] [address] ?[phone] ?Medicaid, Self Pay  ? ?[facility]      ?[address]  ?[place], [place] ?[phone] ?Medicaid and Private Insurance, Self Pay  ? ?[facility] ?[address]  ?[place], [place]  ?[phone] ?Private Insurance, and Self Pay ? ?[facility]      ?[address]    ?[place], [place] [address]  ?[phone] or [phone] ?Private Insurance Only  ? ? ? ?[facility] ?[address]  ?[place], [place] [address] ?[phone] ?Medicaid, Medicare, Private Insurance ? ?[facility] ?[address]  ?[place], [place] [address] ?[phone] ?Services are free or reduced ? ?[facility]  ?[address] ?[phone]  ?Self Pay only, sliding scale ? ?[facility]  ?[address]  ?[place], [place] [address] ?[phone] ?([facility]) ?Self Pay, Medicaid Only  ? ?[facility] ?[address]  ?[place], [place] [address] ?[phone] ?Medicaid, Medicare, Private Insurance ? ? ? ? ? ? ?

## 2023-06-16 NOTE — Interval H&P Note (Signed)
History and Physical Interval Note:  06/16/2023 9:00 AM  Zachary Winters  has presented today for surgery, with the diagnosis of CVA.  The various methods of treatment have been discussed with the patient and family. After consideration of risks, benefits and other options for treatment, the patient has consented to  Procedure(s): TRANSESOPHAGEAL ECHOCARDIOGRAM (TEE) (N/A) as a surgical intervention.  The patient's history has been reviewed, patient examined, no change in status, stable for surgery.  I have reviewed the patient's chart and labs.  Questions were answered to the patient's satisfaction.     Nona Dell

## 2023-06-16 NOTE — Progress Notes (Signed)
Yellow mews for HR. Notified Crystal Reynolds, CRN and Dr. Laural Benes. Patient is resting in bed and just returned from TEE. Pt tolerated PO meds. See new orders. Yellow mews protocol initiated.     06/16/23 1047  Assess: MEWS Score  Temp 98.5 F (36.9 C)  BP (!) 168/96  MAP (mmHg) 115  Pulse Rate 82  SpO2 95 %  O2 Device Room Air  Assess: MEWS Score  MEWS Temp 0  MEWS Systolic 0  MEWS Pulse 0  MEWS RR 2  MEWS LOC 0  MEWS Score 2  MEWS Score Color Yellow  Assess: if the MEWS score is Yellow or Red  Were vital signs taken at a resting state? Yes  Focused Assessment No change from prior assessment  Does the patient meet 2 or more of the SIRS criteria? No  MEWS guidelines implemented  Yes, yellow  Treat  MEWS Interventions Considered administering scheduled or prn medications/treatments as ordered  Take Vital Signs  Increase Vital Sign Frequency  Yellow: Q2hr x1, continue Q4hrs until patient remains green for 12hrs  Escalate  MEWS: Escalate Yellow: Discuss with charge nurse and consider notifying provider and/or RRT  Notify: Charge Nurse/RN  Name of Charge Nurse/RN Notified Thomas Hoff, RN  Provider Notification  Provider Name/Title Dr. Laural Benes  Date Provider Notified 06/16/23  Time Provider Notified 1059  Method of Notification Page  Notification Reason Other (Comment) (yellow mews)  Provider response See new orders  Date of Provider Response 06/16/23  Time of Provider Response 1100  Assess: SIRS CRITERIA  SIRS Temperature  0  SIRS Pulse 0  SIRS Respirations  1  SIRS WBC 0  SIRS Score Sum  1

## 2023-06-16 NOTE — Transfer of Care (Signed)
Immediate Anesthesia Transfer of Care Note  Patient: Zachary Winters  Procedure(s) Performed: TRANSESOPHAGEAL ECHOCARDIOGRAM (TEE)  Patient Location: PACU  Anesthesia Type:General  Level of Consciousness: drowsy  Airway & Oxygen Therapy: Patient Spontanous Breathing and Patient connected to face mask oxygen  Post-op Assessment: Report given to RN and Post -op Vital signs reviewed and stable  Post vital signs: Reviewed and stable  Last Vitals:  Vitals Value Taken Time  BP 110/73   Temp 98.3   Pulse 115   Resp 21   SpO2 97%     Last Pain:  Vitals:   06/16/23 0828  TempSrc: Oral  PainSc: 0-No pain      Patients Stated Pain Goal: 5 (06/16/23 0828)  Complications: No notable events documented.

## 2023-06-16 NOTE — Progress Notes (Signed)
Inpatient Rehabilitation Admissions Coordinator   I have received insurance approval for CIR admit. I await medical workup completion as well as CIR bed availability at [facility] campus in [place].  Ottie Glazier, RN, MSN Rehab Admissions Coordinator [phone] 06/16/2023 4:35 PM

## 2023-06-16 NOTE — Progress Notes (Signed)
Physical Therapy Treatment Patient Details Name: Zachary Winters MRN: [id number] DOB: 28-Aug-1981 Today's Date: 06/16/2023   History of Present Illness Zachary Winters is a 42 year old gentleman with poorly controlled diabetes mellitus on insulin, chronic daily smoker, hypertension, history of marijuana use, diabetic peripheral neuropathy, hyperlipidemia was sent to the emergency department by [facility] with right-sided weakness right-sided facial droop and difficulty speaking.  He was last known to be well 2 days ago at 6 AM when his sister noticed he went out to smoke on ring camera.  He apparently lives with his sister.  Sister noticed the patient lying on the floor having a difficult time communicating only able to nod yes and no.  She noticed that for the last 2 days he has been lying around and sleeping.    PT Comments    Patient sleeping in bed but easily awakened upon therapist arrival and agreeable to participating in therapy session getting out of bed to chair. Patient required min to mod assist with supine to sit and HOB elevated. Assistance to move RLE out of bed and onto floor. Patient required mod to max assist for sit to stand and step pivot transfer from bed to chair with right knee buckling and patient reaching with left hand/arm for recliner despite heavy cuing to use RW and manually placing right hand on RW. Two attempt to complete step pivot with short step over to chair due to losses of balance. Patient agreeable to sitting in recliner at end of session - nursing notified. Patient encouraged to eat more of his lunch but seemed drowzy. Patient would continue to benefit from skilled physical therapy in current environment and next venue to continue return to prior function and increase strength, endurance, balance, coordination, and functional mobility and gait skills.     Recommendations for follow up therapy are one component of a multi-disciplinary discharge planning process, led by the  attending physician.  Recommendations may be updated based on patient status, additional functional criteria and insurance authorization.  Follow Up Recommendations       Assistance Recommended at Discharge Set up Supervision/Assistance  Patient can return home with the following A lot of help with bathing/dressing/bathroom;A lot of help with walking and/or transfers;Help with stairs or ramp for entrance;Assistance with cooking/housework   Equipment Recommendations  None recommended by PT;Other (comment) (to be determined)    Recommendations for Other Services       Precautions / Restrictions Precautions Precautions: Fall Restrictions Weight Bearing Restrictions: No     Mobility  Bed Mobility Overal bed mobility: Needs Assistance Bed Mobility: Supine to Sit     Supine to sit: Mod assist, Min assist     General bed mobility comments: had difficulty moving right side    Transfers Overall transfer level: Needs assistance Equipment used: Rolling walker (2 wheels) Transfers: Sit to/from Stand, Bed to chair/wheelchair/BSC Sit to Stand: Mod assist, Max assist   Step pivot transfers: Mod assist       General transfer comment: unsteady labored movement with poor coordination of RLE, frequent buckling of right knee and reaching for chair arms vs using RW with either upper extremity    Ambulation/Gait Ambulation/Gait assistance: Max assist   Assistive device: Rolling walker (2 wheels) Gait Pattern/deviations: Decreased step length - right, Decreased stance time - right, Decreased step length - left, Knees buckling, Decreased stride length, Antalgic, Ataxic Gait velocity: slow     General Gait Details: limited to a few slow short labored side steps  before having to sit due to RLE buckling with loss of balance   Stairs       Wheelchair Mobility    Modified Rankin (Stroke Patients Only)       Balance Overall balance assessment: Needs  assistance Sitting-balance support: Feet supported, No upper extremity supported Sitting balance-Leahy Scale: Poor Sitting balance - Comments: fair/poor seated at EOB Postural control: Right lateral lean, Posterior lean Standing balance support: No upper extremity supported, Bilateral upper extremity supported Standing balance-Leahy Scale: Poor Standing balance comment: using RW  ]    Cognition Arousal/Alertness: Awake/alert Behavior During Therapy: WFL for tasks assessed/performed Overall Cognitive Status: Within Functional Limits for tasks assessed    ]    Exercises      General Comments        Pertinent Vitals/Pain Pain Assessment Pain Assessment: No/denies pain    Home Living       Prior Function            PT Goals (current goals can now be found in the care plan section) Acute Rehab PT Goals Patient Stated Goal: return home PT Goal Formulation: With patient Time For Goal Achievement: 06/29/23 Potential to Achieve Goals: Good Progress towards PT goals: Progressing toward goals    Frequency    Min 4X/week      PT Plan Current plan remains appropriate       AM-PAC PT "6 Clicks" Mobility   Outcome Measure  Help needed turning from your back to your side while in a flat bed without using bedrails?: A Lot Help needed moving from lying on your back to sitting on the side of a flat bed without using bedrails?: A Lot Help needed moving to and from a bed to a chair (including a wheelchair)?: A Lot Help needed standing up from a chair using your arms (e.g., wheelchair or bedside chair)?: A Lot Help needed to walk in hospital room?: A Lot Help needed climbing 3-5 steps with a railing? : Total 6 Click Score: 11    End of Session Equipment Utilized During Treatment: Gait belt Activity Tolerance: Patient tolerated treatment well;Patient limited by fatigue Patient left: in chair;with call bell/phone within reach;with chair alarm set Nurse Communication:  Mobility status PT Visit Diagnosis: Unsteadiness on feet (R26.81);Other abnormalities of gait and mobility (R26.89);Muscle weakness (generalized) (M62.81)     Time: 4098-1191 PT Time Calculation (min) (ACUTE ONLY): 21 min  Charges:  $Therapeutic Activity: 8-22 mins                     Katina Dung. Hartnett-Rands, MS, PT [facility] [place] [phone]  Britta Mccreedy  Hartnett-Rands 06/16/2023, 1:56 PM

## 2023-06-16 NOTE — Plan of Care (Signed)
  Problem: Education: Goal: Knowledge of General Education information will improve Description: Including pain rating scale, medication(s)/side effects and non-pharmacologic comfort measures Outcome: Progressing   Problem: Health Behavior/Discharge Planning: Goal: Ability to manage health-related needs will improve Outcome: Progressing   Problem: Clinical Measurements: Goal: Ability to maintain clinical measurements within normal limits will improve Outcome: Progressing Goal: Will remain free from infection Outcome: Progressing Goal: Diagnostic test results will improve Outcome: Progressing Goal: Respiratory complications will improve Outcome: Progressing Goal: Cardiovascular complication will be avoided Outcome: Progressing   Problem: Nutrition: Goal: Adequate nutrition will be maintained Outcome: Progressing   Problem: Coping: Goal: Level of anxiety will decrease Outcome: Progressing   Problem: Elimination: Goal: Will not experience complications related to bowel motility Outcome: Progressing Goal: Will not experience complications related to urinary retention Outcome: Progressing   Problem: Pain Managment: Goal: General experience of comfort will improve Outcome: Progressing   Problem: Safety: Goal: Ability to remain free from injury will improve Outcome: Progressing   Problem: Skin Integrity: Goal: Risk for impaired skin integrity will decrease Outcome: Progressing   Problem: Education: Goal: Ability to describe self-care measures that may prevent or decrease complications (Diabetes Survival Skills Education) will improve Outcome: Progressing Goal: Individualized Educational Video(s) Outcome: Progressing   Problem: Coping: Goal: Ability to adjust to condition or change in health will improve Outcome: Progressing   Problem: Fluid Volume: Goal: Ability to maintain a balanced intake and output will improve Outcome: Progressing   Problem: Health  Behavior/Discharge Planning: Goal: Ability to identify and utilize available resources and services will improve Outcome: Progressing Goal: Ability to manage health-related needs will improve Outcome: Progressing   Problem: Metabolic: Goal: Ability to maintain appropriate glucose levels will improve Outcome: Progressing   Problem: Nutritional: Goal: Maintenance of adequate nutrition will improve Outcome: Progressing Goal: Progress toward achieving an optimal weight will improve Outcome: Progressing   Problem: Skin Integrity: Goal: Risk for impaired skin integrity will decrease Outcome: Progressing   Problem: Tissue Perfusion: Goal: Adequacy of tissue perfusion will improve Outcome: Progressing   Problem: Education: Goal: Knowledge of disease or condition will improve Outcome: Progressing Goal: Knowledge of secondary prevention will improve (MUST DOCUMENT ALL) Outcome: Progressing Goal: Knowledge of patient specific risk factors will improve Loraine Leriche N/A or DELETE if not current risk factor) Outcome: Progressing   Problem: Ischemic Stroke/TIA Tissue Perfusion: Goal: Complications of ischemic stroke/TIA will be minimized Outcome: Progressing   Problem: Health Behavior/Discharge Planning: Goal: Ability to manage health-related needs will improve Outcome: Progressing Goal: Goals will be collaboratively established with patient/family Outcome: Progressing   Problem: Self-Care: Goal: Ability to participate in self-care as condition permits will improve Outcome: Progressing Goal: Verbalization of feelings and concerns over difficulty with self-care will improve Outcome: Progressing Goal: Ability to communicate needs accurately will improve Outcome: Progressing   Problem: Nutrition: Goal: Risk of aspiration will decrease Outcome: Progressing Goal: Dietary intake will improve Outcome: Progressing

## 2023-06-16 NOTE — TOC Progression Note (Signed)
Transition of Care [facility]) - Progression Note    Patient Details  Name: Zachary Winters MRN: [id number] Date of Birth: Sep 25, 1981  Transition of Care [facility]) CM/SW Contact  Karn Cassis, [place] Phone Number: 06/16/2023, 12:10 PM  Clinical Narrative:  CIR starting insurance authorization. TOC received consult for substance abuse. LCSW added outpatient substance abuse treatment resource list to AVS as pt positive for marijuana.           Expected Discharge Plan and Services                                               Social Determinants of Health (SDOH) Interventions SDOH Screenings   Food Insecurity: No Food Insecurity (10/31/2019)  Transportation Needs: No Transportation Needs (10/31/2019)  Depression (PHQ2-9): Medium Risk (01/21/2021)  Financial Resource Strain: Low Risk  (10/31/2019)  Physical Activity: Sufficiently Active (10/31/2019)  Social Connections: Socially Isolated (10/31/2019)  Stress: No Stress Concern Present (10/31/2019)  Tobacco Use: High Risk (06/16/2023)    Readmission Risk Interventions     No data to display

## 2023-06-16 NOTE — CV Procedure (Signed)
Transesophageal echocardiogram  Indication: Stroke  Description of procedure: After informed consent was obtained and timeout performed in the PACU, patient positioned left lateral decubitus.  Cetacaine spray used for oropharyngeal anesthesia and bite-block placed.  Monitor deep sedation via propofol was utilized per anesthesia service.  A multiplane echocardiographic probe was inserted in the esophagus and images obtained as follows:   1. Left ventricular ejection fraction, by estimation, is 55 to 60%. The  left ventricle has normal function. The left ventricle has no regional  wall motion abnormalities. There is moderate concentric left ventricular  hypertrophy.   2. Right ventricular systolic function is normal. The right ventricular  size is normal.   3. No left atrial/left atrial appendage thrombus was detected. The LAA  emptying velocity was 80 cm/s.   4. The mitral valve is grossly normal. Trivial mitral valve  regurgitation.   5. The aortic valve is tricuspid. Aortic valve regurgitation is not  visualized.   6. Agitated saline contrast bubble study was negative, with no evidence  of any interatrial shunt.   There were no immediate complications.  Disposition: Patient transferred back to telemetry unit for further care by primary team and neurology.  Jonelle Sidle, M.D., F.A.C.C.

## 2023-06-16 NOTE — Progress Notes (Signed)
PRN hydralazine given as ordered for SBP 179.     06/16/23 1200  Vitals  Temp 99.5 F (37.5 C)  Temp Source Oral  BP (!) 179/100  MAP (mmHg) 123  BP Location Left Arm  BP Method Automatic  Patient Position (if appropriate) Lying  Pulse Rate 100  Pulse Rate Source Dinamap  Resp 20  Level of Consciousness  Level of Consciousness Alert  MEWS COLOR  MEWS Score Color Green  Oxygen Therapy  SpO2 98 %  O2 Device Room Air  MEWS Score  MEWS Temp 0  MEWS Systolic 0  MEWS Pulse 0  MEWS RR 0  MEWS LOC 0  MEWS Score 0

## 2023-06-16 NOTE — Progress Notes (Signed)
PRN hydralazine effective.      06/16/23 1429  Vitals  BP (!) 145/86  MAP (mmHg) 100  BP Location Left Arm  BP Method Automatic  Patient Position (if appropriate) Sitting  Pulse Rate 89  MEWS COLOR  MEWS Score Color Green  MEWS Score  MEWS Temp 0  MEWS Systolic 0  MEWS Pulse 0  MEWS RR 0  MEWS LOC 0  MEWS Score 0

## 2023-06-16 NOTE — Progress Notes (Addendum)
Inpatient Rehabilitation Admissions Coordinator   I received call from patient's sister. She verifies that she prefers CIR admit at [facility] and that when she works, her [place], can provide caregiver supports at home. I will begin Auth with [place] [employer] for possible CIR admit next week.  Ottie Glazier, RN, MSN Rehab Admissions Coordinator [phone] 06/16/2023 11:15 AM

## 2023-06-16 NOTE — Anesthesia Preprocedure Evaluation (Signed)
Anesthesia Evaluation  Patient identified by MRN, date of birth, ID band Patient awake    Reviewed: Allergy & Precautions, H&P , NPO status , Patient's Chart, lab work & pertinent test results  Airway Mallampati: III  TM Distance: >3 FB Neck ROM: Full    Dental no notable dental hx. (+) Dental Advisory Given, Teeth Intact   Pulmonary neg pulmonary ROS, Current Smoker and Patient abstained from smoking.   Pulmonary exam normal breath sounds clear to auscultation       Cardiovascular hypertension, Pt. on medications Normal cardiovascular exam Rhythm:Regular Rate:Normal     Neuro/Psych CVA, Residual Symptoms  negative psych ROS   GI/Hepatic negative GI ROS,,,(+)     substance abuse  marijuana use  Endo/Other  diabetes, Well Controlled, Type 2, Oral Hypoglycemic Agents    Renal/GU negative Renal ROS  negative genitourinary   Musculoskeletal negative musculoskeletal ROS (+)    Abdominal   Peds negative pediatric ROS (+)  Hematology negative hematology ROS (+)   Anesthesia Other Findings   Reproductive/Obstetrics negative OB ROS                             Anesthesia Physical Anesthesia Plan  ASA: 4  Anesthesia Plan: General   Post-op Pain Management: Minimal or no pain anticipated   Induction: Intravenous  PONV Risk Score and Plan: 0 and TIVA  Airway Management Planned: Nasal Cannula and Natural Airway  Additional Equipment:   Intra-op Plan:   Post-operative Plan:   Informed Consent: I have reviewed the patients History and Physical, chart, labs and discussed the procedure including the risks, benefits and alternatives for the proposed anesthesia with the patient or authorized representative who has indicated his/her understanding and acceptance.     Dental advisory given  Plan Discussed with: CRNA and Surgeon  Anesthesia Plan Comments:        Anesthesia Quick  Evaluation

## 2023-06-16 NOTE — PMR Pre-admission (Signed)
PMR Admission Coordinator Pre-Admission Assessment  Patient: Zachary Winters is an 42 y.o., male MRN: [id number] DOB: November 21, 1981 Height: 6\' 1"  (185.4 cm) Weight: 68 kg  Insurance Information HMO:     PPO:      PCP:      IPA:      80/20:      OTHER:  PRIMARY: [place] Medicaid BCBS Healthy Blue      Policy#: [id number]      Subscriber: pt CM Name: Lynden Ang      Phone#: [phone]     Fax#: [phone] Pre-Cert#: [id number] approved 6/24 for admit 06/27/23-07/03/23 with updates due 07/03/23 Employer:  Benefits:  Phone #: [phone]     Name: 6/21 Eff. Date: 11/25/22 until 11/25/23     Deduct: none      Out of Pocket Max: none      Life Max: none CIR: 100 % per medicaid      SNF: per medicaid Outpatient: per medicaid     Co-Pay:  Home Health: per medicaid      Co-Pay:  DME: per medicaid     Co-Pay:  Providers: in network  SECONDARY: none  Financial Counselor:       Phone#:   The Data processing manager" for patients in Inpatient Rehabilitation Facilities with attached "Privacy Act Statement-Health Care Records" was provided and verbally reviewed with: N/A  Emergency Contact Information Contact Information     Name Relation Home Work Mobile   [place] Sister   [phone]   Deanna Artis Father   [phone]   Elliot Dally   [phone]      Current Medical History  Patient Admitting Diagnosis: CVA  History of Present Illness: 42 year old amle with history of poorly controlled DM on insulin, smoker, HTN, h/o marijuana use, DM peripheral neuropathy, HLD who presented on 06/14/23 to [facility] with ridight sided weakness, right facial droop and difficulty speaking. Last known well 2 days ago.   CT scan showed moderate sized subacute infarct in the left basal ganglia/corona radiata with probable petechial hemorrhage. 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 SLP evaled with Dysphagia 2 diet. Neurology consulted  and recommended ASA and Plavix for 90 days followed by Jonne Ply alone. Also CT chest/ABD/Pelvis to rule out occult malignancy. CT was negative for sign of malignancy but also reported right upper lobe predominant tree-in-bud patterns suggesting chronic inflammation atypical infection such as MAC. Will refer to OP pulmonary clinic. Also noted advanced coronary calcification and to f/u OP with cardiology.   Hgb A1c 11. SSI and CBGS. Essential HTN added losartan, metoprolol and amlodipine. Atorvastin ordered for HLD. Severe vitamin D deficiency with Drisdol added. Lovenox for DVT prophylaxis. TEE 6/21 EF 55 to 60%. Moderate concentric left ventricular hypertrophy. Trivial mitral valve regurgitation. Bubble study negative.   On 6/24 Neurology reconuslted due to worsening swallowing. Repeat MRI showed an increase of as acute Left MCA infarct in the left basal ganglia and overlying frontal lobe, including new/interval infarct in the left frontal lobe. Similar petechial hemorrhage without mass occupying acute hemorrhage. New small acute infarct in the right posterior limb of the internal capsule. Pt transferred to [facility] on 06/20/23  for planned stent placement, but family ultimately opted not to have stent done. Coretrak placed 06/21/23 and placed was placed on dysphagia 1 diet with honey thick liquids following MBSS 6/28. On 7/1 core track got dislodged after episode of vomiting and coughing. Pt. Was seen by PT/OT/SLP throughout  admission and they recommended CIR to assist return to PLOF.    Complete NIHSS TOTAL: 13  Patient's medical record from [facility] has been reviewed by the rehabilitation admission coordinator and physician.  Past Medical History  Past Medical History:  Diagnosis Date   Diabetes mellitus without complication (HCC)    Has the patient had major surgery during 100 days prior to admission? Yes  Family History   family history is not on file.  Current Medications  Current  Facility-Administered Medications:    0.9 %  sodium chloride infusion, , Intravenous, Continuous, Johnson, Clanford L, MD, Last Rate: 125 mL/hr at 06/19/23 1347, New Bag at 06/19/23 1347   acetaminophen (TYLENOL) tablet 650 mg, 650 mg, Oral, Q4H PRN, 650 mg at 06/15/23 0035 **OR** acetaminophen (TYLENOL) 160 MG/5ML solution 650 mg, 650 mg, Per Tube, Q4H PRN **OR** acetaminophen (TYLENOL) suppository 650 mg, 650 mg, Rectal, Q4H PRN, Johnson, Clanford L, MD   aspirin EC tablet 81 mg, 81 mg, Oral, Daily, Johnson, Clanford L, MD, 81 mg at 06/19/23 1013   atorvastatin (LIPITOR) tablet 80 mg, 80 mg, Oral, QPM, Johnson, Clanford L, MD, 80 mg at 06/18/23 1732   clopidogrel (PLAVIX) tablet 75 mg, 75 mg, Oral, Daily, Johnson, Clanford L, MD, 75 mg at 06/19/23 1012   enoxaparin (LOVENOX) injection 40 mg, 40 mg, Subcutaneous, Daily, Johnson, Clanford L, MD, 40 mg at 06/19/23 1215   insulin aspart (novoLOG) injection 0-9 Units, 0-9 Units, Subcutaneous, Q6H, Johnson, Clanford L, MD   nicotine (NICODERM CQ - dosed in mg/24 hours) patch 21 mg, 21 mg, Transdermal, Daily PRN, Johnson, Clanford L, MD   ondansetron (ZOFRAN) injection 4 mg, 4 mg, Intravenous, Q6H PRN, Johnson, Clanford L, MD, 4 mg at 06/17/23 1713   prochlorperazine (COMPAZINE) injection 10 mg, 10 mg, Intravenous, Q4H PRN, Laural Benes, Clanford L, MD, 10 mg at 06/17/23 2135   senna-docusate (Senokot-S) tablet 1 tablet, 1 tablet, Oral, QHS PRN, Laural Benes, Clanford L, MD   [START ON 06/22/2023] Vitamin D (Ergocalciferol) (DRISDOL) 1.25 MG (50000 UNIT) capsule 50,000 Units, 50,000 Units, Oral, Q7 days, Johnson, Clanford L, MD  Patients Current Diet:  Diet Order             Diet NPO time specified  Diet effective now                  Precautions / Restrictions Precautions Precautions: Fall Restrictions Weight Bearing Restrictions: No   Has the patient had 2 or more falls or a fall with injury in the past year? No  Prior Activity Level Limited  Community (1-2x/wk): independent  Prior Functional Level Self Care: Did the patient need help bathing, dressing, using the toilet or eating? Independent  Indoor Mobility: Did the patient need assistance with walking from room to room (with or without device)? Independent  Stairs: Did the patient need assistance with internal or external stairs (with or without device)? Independent  Functional Cognition: Did the patient need help planning regular tasks such as shopping or remembering to take medications? Independent  Patient Information Are you of Hispanic, Latino/a,or Spanish origin?: A. No, not of Hispanic, Latino/a, or Spanish origin, X. Patient unable to respond (per sister) What is your race?: B. Black or African American, X. Patient unable to respond (per sister) Do you need or want an interpreter to communicate with a doctor or health care staff?: 9. Unable to respond (no per sister)  Patient's Response To:  Health Literacy and Transportation Is the patient able to respond  to health literacy and transportation needs?: No Health Literacy - How often do you need to have someone help you when you read instructions, pamphlets, or other written material from your doctor or pharmacy?: Patient unable to respond In the past 12 months, has lack of transportation kept you from medical appointments or from getting medications?: No (per sister) In the past 12 months, has lack of transportation kept you from meetings, work, or from getting things needed for daily living?: No (per sister)  Journalist, newspaper / Equipment Home Assistive Devices/Equipment: CBG Meter Home Equipment: None  Prior Device Use: Indicate devices/aids used by the patient prior to current illness, exacerbation or injury? None of the above  Current Functional Level Cognition  Arousal/Alertness: Awake/alert Overall Cognitive Status: Within Functional Limits for tasks assessed Orientation Level: Oriented to  person General Comments: Able to follow commands well. Able to answer yes and no questions for history.    Extremity Assessment (includes Sensation/Coordination)  Upper Extremity Assessment: Defer to OT evaluation RUE Deficits / Details: Minimal tone in R shoulder and elbow. Flaccid with no active movement of any kind in R UE. RUE Sensation: WNL RUE Coordination: decreased fine motor, decreased gross motor  Lower Extremity Assessment: Generalized weakness, RLE deficits/detail RLE Deficits / Details: grossly -3/5, except ankle dorsiflexion 0/5, stiffnes, spastic like movement of RLE RLE Sensation: decreased light touch, decreased proprioception RLE Coordination: decreased fine motor, decreased gross motor    ADLs  Overall ADL's : Needs assistance/impaired Eating/Feeding: Moderate assistance, Minimal assistance, Sitting Grooming: Moderate assistance, Sitting Upper Body Bathing: Moderate assistance, Sitting Lower Body Bathing: Maximal assistance, Sitting/lateral leans, Bed level Upper Body Dressing : Moderate assistance, Sitting Lower Body Dressing: Maximal assistance, Sitting/lateral leans Toilet Transfer: Moderate assistance, Maximal assistance, Stand-pivot, Rolling walker (2 wheels) Toilet Transfer Details (indicate cue type and reason): Simulated via EOB to chair transfer and sit to stand from chair. Toileting- Clothing Manipulation and Hygiene: Total assistance, Maximal assistance, Bed level Tub/ Shower Transfer: Moderate assistance, Maximal assistance, Rolling walker (2 wheels) Functional mobility during ADLs: Maximal assistance, Moderate assistance, Rolling walker (2 wheels) General ADL Comments: Pt able to ambulate one step forward from chair with RW.    Mobility  Overal bed mobility: Needs Assistance Bed Mobility: Supine to Sit Supine to sit: Mod assist General bed mobility comments: increased time, diffiuclty moving RLE, unable to use RUE    Transfers  Overall transfer  level: Needs assistance Equipment used: Hemi-walker Transfers: Sit to/from Stand, Bed to chair/wheelchair/BSC Sit to Stand: Mod assist Bed to/from chair/wheelchair/BSC transfer type:: Step pivot Step pivot transfers: Mod assist General transfer comment: able to hold onto hemiw-walker with left hand during sit to stand, but chose use armrest of chair for completing transfer    Ambulation / Gait / Stairs / Wheelchair Mobility  Ambulation/Gait Ambulation/Gait assistance: Max Chemical engineer (Feet): 2 Feet Assistive device: Hemi-walker Gait Pattern/deviations: Decreased step length - right, Decreased stance time - right, Decreased step length - left, Knees buckling, Decreased stride length, Antalgic, Ataxic, Shuffle General Gait Details: limited to a couple of slow labored unsteady side steps with difficulty advancing RLE due to weak and stiff Gait velocity: slow    Posture / Balance Dynamic Sitting Balance Sitting balance - Comments: fair/poor seated at EOB Balance Overall balance assessment: Needs assistance Sitting-balance support: Feet supported, No upper extremity supported Sitting balance-Leahy Scale: Poor Sitting balance - Comments: fair/poor seated at EOB Postural control: Right lateral lean, Posterior lean Standing balance support: Reliant on assistive device  for balance, During functional activity, Single extremity supported Standing balance-Leahy Scale: Poor Standing balance comment: uisng Hemi-walker    Special needs/care consideration Smoker Hgb A1c 11   Previous Home Environment  Living Arrangements:  (lives with sister, her spouse and 57 yo child)  Lives With: Family Available Help at Discharge:  (sister's StepDad, can provide care when she is working) Type of Home: House Home Layout: One level Home Access: Level entry [employer] Shower/Tub: Health visitor: Standard Bathroom Accessibility: Yes How Accessible: Accessible via walker, Accessible  via wheelchair Home Care Services: No  Discharge Living Setting Plans for Discharge Living Setting: Lives with (comment) (sister and her family) Type of Home at Discharge: House Discharge Home Layout: One level Discharge Home Access: Level entry Discharge Bathroom Shower/Tub: Walk-in shower Discharge Bathroom Toilet: Standard Discharge Bathroom Accessibility: Yes How Accessible: Accessible via walker Does the patient have any problems obtaining your medications?: No  Social/Family/Support Systems Contact Information: sister, Scientist, forensic Anticipated Caregiver: sister adn sister's StepDad, Andrey Campanile when sister works Anticipated Industrial/product designer Information: see contacts Ability/Limitations of Caregiver: sister works Medical laboratory scientific officer: 24/7 Discharge Plan Discussed with Primary Caregiver: Yes Is Caregiver In Agreement with Plan?: Yes Does Caregiver/Family have Issues with Lodging/Transportation while Pt is in Rehab?: Yes  Goals Patient/Family Goal for Rehab: supervision to min asisst with PT, OT and SLP Expected length of stay: ELOS 10 to 14 days Pt/Family Agrees to Admission and willing to participate: Yes Program Orientation Provided & Reviewed with Pt/Caregiver Including Roles  & Responsibilities: Yes  Decrease burden of Care through IP rehab admission: n/a  Possible need for SNF placement upon discharge: not anticipated  Patient Condition: I have reviewed medical records from [facility], spoken with  family member. I discussed via phone for inpatient rehabilitation assessment.  Patient will benefit from ongoing PT, OT, and SLP, can actively participate in 3 hours of therapy a day 5 days of the week, and can make measurable gains during the admission.  Patient will also benefit from the coordinated team approach during an Inpatient Acute Rehabilitation admission.  The patient will receive intensive therapy as well as Rehabilitation physician, nursing, social worker, and care management  interventions.  Due to bladder management, bowel management, safety, skin/wound care, disease management, medication administration, pain management, and patient education the patient requires 24 hour a day rehabilitation nursing.  The patient is currently Mod+2 with mobility and basic ADLs.  Discharge setting and therapy post discharge at home with home health is anticipated.  Patient has agreed to participate in the Acute Inpatient Rehabilitation Program and will admit today.  Preadmission Screen Completed By:  Clois Dupes, RN MSN 06/19/2023 2:08 PM ______________________________________________________________________   Discussed status with Dr. Berline Chough on 06/27/23 at 1014 and received approval for admission today.  Admission Coordinator:  Clois Dupes, RN MSN with updates by Maurie Olesen Salon, MS, CCC-SLP at  1014/Date 06/27/23   Assessment/Plan: Diagnosis: L MCA stroke-  Does the need for close, 24 hr/day Medical supervision in concert with the patient's rehab needs make it unreasonable for this patient to be served in a less intensive setting? Yes Co-Morbidities requiring supervision/potential complications: global aphasia; dysphagia; dense R hemiplegia Due to bladder management, bowel management, safety, skin/wound care, disease management, medication administration, pain management, and patient education, does the patient require 24 hr/day rehab nursing? Yes Does the patient require coordinated care of a physician, rehab nurse, PT, OT, and SLP to address physical and functional deficits in the context of the above medical diagnosis(es)? Yes  Addressing deficits in the following areas: balance, endurance, locomotion, strength, transferring, bowel/bladder control, bathing, dressing, feeding, grooming, toileting, cognition, speech, language, and swallowing Can the patient actively participate in an intensive therapy program of at least 3 hrs of therapy 5 days a week? Yes The potential  for patient to make measurable gains while on inpatient rehab is good and fair Anticipated functional outcomes upon discharge from inpatient rehab: supervision and min assist PT, supervision and min assist OT, supervision and min assist SLP Estimated rehab length of stay to reach the above functional goals is: 14-18 days Anticipated discharge destination: Home 10. Overall Rehab/Functional Prognosis: good and fair   MD Signature:

## 2023-06-16 NOTE — Progress Notes (Signed)
  Echocardiogram Echocardiogram Transesophageal has been performed.  Zachary Winters 06/16/2023, 10:01 AM

## 2023-06-17 DIAGNOSIS — I639 Cerebral infarction, unspecified: Secondary | ICD-10-CM | POA: Diagnosis not present

## 2023-06-17 DIAGNOSIS — F17200 Nicotine dependence, unspecified, uncomplicated: Secondary | ICD-10-CM | POA: Diagnosis not present

## 2023-06-17 DIAGNOSIS — E559 Vitamin D deficiency, unspecified: Secondary | ICD-10-CM | POA: Diagnosis not present

## 2023-06-17 DIAGNOSIS — R4701 Aphasia: Secondary | ICD-10-CM | POA: Diagnosis not present

## 2023-06-17 LAB — GLUCOSE, CAPILLARY
Glucose-Capillary: 232 mg/dL — ABNORMAL HIGH (ref 70–99)
Glucose-Capillary: 205 mg/dL — ABNORMAL HIGH (ref 70–99)
Glucose-Capillary: 179 mg/dL — ABNORMAL HIGH (ref 70–99)
Glucose-Capillary: 136 mg/dL — ABNORMAL HIGH (ref 70–99)
Glucose-Capillary: 157 mg/dL — ABNORMAL HIGH (ref 70–99)

## 2023-06-17 LAB — ANTITHROMBIN III: AntiThromb III Func: 113 % (ref 75–120)

## 2023-06-17 MED ORDER — ONDANSETRON HCL 4 MG/2ML IJ SOLN
4.0000 mg | Freq: Four times a day (QID) | INTRAMUSCULAR | Status: DC | PRN
  Administered 2023-06-17 – 2023-06-26 (×2): 4 mg via INTRAVENOUS
  Filled 2023-06-17: qty 2

## 2023-06-17 MED ORDER — PROCHLORPERAZINE EDISYLATE 10 MG/2ML IJ SOLN
10.0000 mg | INTRAMUSCULAR | Status: DC | PRN
  Administered 2023-06-17 (×2): 10 mg via INTRAVENOUS
  Filled 2023-06-17 (×2): qty 2

## 2023-06-17 MED ORDER — INSULIN GLARGINE-YFGN 100 UNIT/ML ~~LOC~~ SOLN
25.0000 [IU] | Freq: Every day | SUBCUTANEOUS | Status: DC
  Administered 2023-06-17 – 2023-06-18 (×2): 25 [IU] via SUBCUTANEOUS
  Filled 2023-06-17 (×4): qty 0.25

## 2023-06-17 MED ORDER — INSULIN ASPART 100 UNIT/ML IJ SOLN: 6.0000 [IU] | Freq: Three times a day (TID) | INTRAMUSCULAR | Status: DC

## 2023-06-17 NOTE — Plan of Care (Signed)
  Problem: Education: Goal: Knowledge of General Education information will improve Description: Including pain rating scale, medication(s)/side effects and non-pharmacologic comfort measures Outcome: Progressing   Problem: Health Behavior/Discharge Planning: Goal: Ability to manage health-related needs will improve Outcome: Progressing   Problem: Clinical Measurements: Goal: Ability to maintain clinical measurements within normal limits will improve Outcome: Progressing Goal: Will remain free from infection Outcome: Progressing Goal: Diagnostic test results will improve Outcome: Progressing Goal: Respiratory complications will improve Outcome: Progressing Goal: Cardiovascular complication will be avoided Outcome: Progressing   Problem: Activity: Goal: Risk for activity intolerance will decrease Outcome: Progressing   Problem: Nutrition: Goal: Adequate nutrition will be maintained Outcome: Progressing   Problem: Coping: Goal: Level of anxiety will decrease Outcome: Progressing   Problem: Elimination: Goal: Will not experience complications related to bowel motility Outcome: Progressing Goal: Will not experience complications related to urinary retention Outcome: Progressing   Problem: Pain Managment: Goal: General experience of comfort will improve Outcome: Progressing   Problem: Safety: Goal: Ability to remain free from injury will improve Outcome: Progressing   Problem: Skin Integrity: Goal: Risk for impaired skin integrity will decrease Outcome: Progressing   Problem: Education: Goal: Ability to describe self-care measures that may prevent or decrease complications (Diabetes Survival Skills Education) will improve Outcome: Progressing Goal: Individualized Educational Video(s) Outcome: Progressing   Problem: Coping: Goal: Ability to adjust to condition or change in health will improve Outcome: Progressing   Problem: Fluid Volume: Goal: Ability to  maintain a balanced intake and output will improve Outcome: Progressing   Problem: Health Behavior/Discharge Planning: Goal: Ability to identify and utilize available resources and services will improve Outcome: Progressing Goal: Ability to manage health-related needs will improve Outcome: Progressing   Problem: Metabolic: Goal: Ability to maintain appropriate glucose levels will improve Outcome: Progressing   Problem: Nutritional: Goal: Maintenance of adequate nutrition will improve Outcome: Progressing Goal: Progress toward achieving an optimal weight will improve Outcome: Progressing   Problem: Skin Integrity: Goal: Risk for impaired skin integrity will decrease Outcome: Progressing   Problem: Tissue Perfusion: Goal: Adequacy of tissue perfusion will improve Outcome: Progressing   Problem: Education: Goal: Knowledge of disease or condition will improve Outcome: Progressing Goal: Knowledge of secondary prevention will improve (MUST DOCUMENT ALL) Outcome: Progressing Goal: Knowledge of patient specific risk factors will improve (Mark N/A or DELETE if not current risk factor) Outcome: Progressing   Problem: Ischemic Stroke/TIA Tissue Perfusion: Goal: Complications of ischemic stroke/TIA will be minimized Outcome: Progressing   Problem: Coping: Goal: Will verbalize positive feelings about self Outcome: Progressing Goal: Will identify appropriate support needs Outcome: Progressing   Problem: Health Behavior/Discharge Planning: Goal: Ability to manage health-related needs will improve Outcome: Progressing Goal: Goals will be collaboratively established with patient/family Outcome: Progressing   Problem: Self-Care: Goal: Ability to participate in self-care as condition permits will improve Outcome: Progressing Goal: Verbalization of feelings and concerns over difficulty with self-care will improve Outcome: Progressing Goal: Ability to communicate needs accurately  will improve Outcome: Progressing   Problem: Nutrition: Goal: Risk of aspiration will decrease Outcome: Progressing Goal: Dietary intake will improve Outcome: Progressing   

## 2023-06-17 NOTE — Progress Notes (Signed)
PROGRESS NOTE   Zachary Winters  QMV:[id number] DOB: Jan 05, 1981 DOA: 06/14/2023 PCP: System, Provider Not In   Chief Complaint  Patient presents with   Weakness   Level of care: Telemetry  Brief Admission History:  42 year old gentleman with poorly controlled diabetes mellitus on insulin, chronic daily smoker, hypertension, history of marijuana use, diabetic peripheral neuropathy, hyperlipidemia was sent to the emergency department by [facility] with right-sided weakness right-sided facial droop and difficulty speaking.  He was last known to be well 2 days ago at 6 AM when his sister noticed he went out to smoke on ring camera.  He apparently lives with his sister.  Sister noticed the patient lying on the floor having a difficult time communicating only able to nod yes and no.  She noticed that for the last 2 days he has been lying around and sleeping.  He was seen in the ED and noted to have a significant aphasia and right-sided hemiparesis.  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  His blood glucose was 109.  Code Stroke was not activated due to being 48 hours since last known well time.  Pt is being admitted into the hospital for further work up and treatment of acute CVA.     Assessment and Plan:  Acute CVA of left basal ganglia  -Pt presented with a dense right hemiparesis and aphasia that is unchanged -pt presented 2 days after last known well time  -MRI brain with findings of acute to subacute perforator infarct at the left basal ganglia and adjacent white matter with petechial hemorrhage. 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 -admitted for full stroke work up and inpatient neurology consultation  -SLP recommending Dys 2 diet for now pending SLP follow up. -neurology recommending starting asp 81 mg with plavix 75 mg daily for 90 days followed  by aspirin alone -started atorvastatin 80 mg daily  -2D echocardiogram and carotid dopplers ordered -TEE recommended by neurology. TEE completed on 6/21 - no thrombus identified.  -PT/OT/SLP evaluation ordered and recommending CIR evaluation for rehab  -lipid panel - suboptimally controlled; better glycemic control will also improve lipids -NPO after midnight for TEE on 6/21.  -aspiration precautions -completed IV fluid  -we tried to maintain SBP 160-180 initially, then started to lower SBP to 140-160 over next days per neuro recs -CT Chest/Abd/Pelvis completed to rule out occult malignancy - CT was negative for signs of malignancy but also reported Right upper lobe predominant tree-in-bud pattern suggesting chronic inflammation atypical infection such as MAC. Will refer him out to pulmonary clinic at discharge for further evaluation of this.  Also CT noted advanced coronary calcifications. Will refer him out to cardiology at discharge for surveillance.  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 -Continue to monitor CBG ACHS and provide SSI coverage as needed -try to avoid hypoglycemia -follow up A1c - 11.1% as evidence of poorly controlled disease  -follow up TSH - WNL CBG (last 3)  Recent Labs    06/16/23 2101 06/17/23 0726 06/17/23 1114  GLUCAP 237* 232* 205*  Adding prandial coverage for meals eaten >50% Adding basal coverage  25 units glargine daily     Essential Hypertension - next goal BP is 140-160 over next few days per neuro - added losartan 25 mg, metoprolol 25 mg BID, amlodipine  5 mg    Hyperlipidemia - working on improving glycemic control - atorvastatin 80 mg daily ordered  Severe Vitamin D deficiency - Drisdol 50 k caps once weekly started    Tobacco Abuser - Pt advised to absolutely stop all tobacco use - nicotine patch ordered if needed - counseling on tobacco cessation ongoing    Recreational Drug use - per history, will check a urine toxicology screen to see if he has been using recently - UDS positive for marijuana   - TOC consulted for substance abuse resources and counseling   Diabetic peripheral polyneuropathy - he hadn't been taking any home med for this recently - not currently complaining of symptoms - working on improving glycemic control     DVT prophylaxis: enoxaparin Code Status: Full  Family Communication: sister, grandmother 6/19 Disposition: Status is: Inpatient    Consultants:  Neurology  Cardiology for TEE PT/OT/SLP CIR for eval   Procedures:  TEE 06/16/23 with Dr Diona Browner  Antimicrobials:    Subjective: Pt not eating very much today.  No change in expressive aphasia or right hemiparesis.  He nods agreement with acute inpatient rehab.   Objective: Vitals:   06/16/23 2057 06/17/23 0100 06/17/23 0500 06/17/23 0742  BP: 135/84 (!) 147/92 (!) 159/92 (!) 161/99  Pulse: 92 92 93 88  Resp:    16  Temp: 98.9 F (37.2 C) 99.7 F (37.6 C) 99.6 F (37.6 C) 98.2 F (36.8 C)  TempSrc: Oral Oral Oral Oral  SpO2: 100% 98% 100% 98%  Weight:      Height:        Intake/Output Summary (Last 24 hours) at 06/17/2023 1151 Last data filed at 06/17/2023 0500 Gross per 24 hour  Intake --  Output 2750 ml  Net -2750 ml   Filed Weights   06/14/23 0842 06/16/23 0828  Weight: 68 kg 68 kg   Examination:  General exam: Appears calm and comfortable, sitting up in chair, expressive aphasia, dense right hemiparesis unchanged, he is very alert and he can express understanding with nods and blinks Respiratory system: Clear to auscultation. Respiratory effort normal. Cardiovascular system: normal S1 & S2 heard. No JVD, murmurs, rubs, gallops or clicks. No pedal edema. Gastrointestinal system: Abdomen is nondistended, soft and nontender. No organomegaly or masses felt. Normal bowel sounds heard. Central nervous system: Alert and oriented. Expressive  aphasia, dense right hemiparesis unchanged.  Extremities: dense right hemiparesis Skin: No rashes, lesions or ulcers. Psychiatry: Judgement and insight UTD. Mood & affect UTD.   Data Reviewed: I have personally reviewed following labs and imaging studies  CBC: Recent Labs  Lab 06/14/23 0911 06/16/23 0416  WBC 10.0 7.4  NEUTROABS 7.5  --   HGB 13.1 11.1*  HCT 40.2 34.0*  MCV 91.2 91.4  PLT 278 237    Basic Metabolic Panel: Recent Labs  Lab 06/14/23 0911 06/15/23 0514 06/16/23 0416  NA 143 141 136  K 3.4* 3.6 3.8  CL 106 106 104  CO2 27 25 24   GLUCOSE 108* 157* 192*  BUN 19 16 14   CREATININE 1.57* 1.49* 1.45*  CALCIUM 9.2 8.6* 8.6*  MG  --  2.0 1.8    CBG: Recent Labs  Lab 06/16/23 1057 06/16/23 1622 06/16/23 2101 06/17/23 0726 06/17/23 1114  GLUCAP 194* 213* 237* 232* 205*    No results found for this or any previous visit (from the past 240 hour(s)).   Radiology Studies: ECHO TEE  Result Date: 06/16/2023    TRANSESOPHOGEAL ECHO REPORT  Patient Name:   Zachary Winters Date of Exam: 06/16/2023 Medical Rec #:  [id number]       Height:       73.0 in Accession #:    [id number]      Weight:       149.9 lb Date of Birth:  [birth year]       BSA:          1.904 m Patient Age:    42 years        BP:           172/89 mmHg Patient Gender: M               HR:           134 bpm. [facility] Procedure: Transesophageal Echo, Cardiac Doppler, Limited Color Doppler and            Saline Contrast Bubble Study Indications:    Cerebral Infarction, unspecified I63.9  History:        Patient has prior history of Echocardiogram examinations, most                 recent 06/14/2023. Stroke; Risk Factors:Current Smoker, Diabetes,                 Hypertension and Dyslipidemia.  Sonographer:    Aron Baba Referring Phys: [id number] Ellsworth Lennox PROCEDURE: After discussion of the risks and benefits of a TEE, an informed consent was obtained from the patient. The  transesophogeal probe was passed without difficulty through the esophogus of the patient. Imaged were obtained with the patient in a left lateral decubitus position. Local oropharyngeal anesthetic was provided with Cetacaine. Sedation performed by different physician. The patient was monitored while under deep sedation. Anesthestetic sedation was provided intravenously by Anesthesiology: 475.1mg  of Propofol. The patient's vital signs; including heart rate, blood pressure, and oxygen saturation; remained stable throughout the procedure. The patient developed no complications during the procedure.  IMPRESSIONS  1. Left ventricular ejection fraction, by estimation, is 55 to 60%. The left ventricle has normal function. The left ventricle has no regional wall motion abnormalities. There is moderate concentric left ventricular hypertrophy.  2. Right ventricular systolic function is normal. The right ventricular size is normal.  3. No left atrial/left atrial appendage thrombus was detected. The LAA emptying velocity was 80 cm/s.  4. The mitral valve is grossly normal. Trivial mitral valve regurgitation.  5. The aortic valve is tricuspid. Aortic valve regurgitation is not visualized.  6. Agitated saline contrast bubble study was negative, with no evidence of any interatrial shunt. FINDINGS  Left Ventricle: Left ventricular ejection fraction, by estimation, is 55 to 60%. The left ventricle has normal function. The left ventricle has no regional wall motion abnormalities. The left ventricular internal cavity size was normal in size. There is  moderate concentric left ventricular hypertrophy. Right Ventricle: The right ventricular size is normal. No increase in right ventricular wall thickness. Right ventricular systolic function is normal. Left Atrium: Left atrial size was normal in size. No left atrial/left atrial appendage thrombus was detected. The LAA emptying velocity was 80 cm/s. Right Atrium: Right atrial size was  normal in size. Pericardium: There is no evidence of pericardial effusion. Mitral Valve: The mitral valve is grossly normal. Trivial mitral valve regurgitation. Tricuspid Valve: The tricuspid valve is grossly normal. Tricuspid valve regurgitation is mild. Aortic Valve: The aortic valve is tricuspid. Aortic valve regurgitation is not visualized. Pulmonic Valve: The pulmonic  valve was grossly normal. Pulmonic valve regurgitation is trivial. Aorta: The aortic root is normal in size and structure. There is minimal (Grade I) plaque involving the descending aorta. IAS/Shunts: No atrial level shunt detected by color flow Doppler. Agitated saline contrast was given intravenously to evaluate for intracardiac shunting. Agitated saline contrast bubble study was negative, with no evidence of any interatrial shunt. Additional Comments: Spectral Doppler performed. Nona Dell MD Electronically signed by Nona Dell MD Signature Date/Time: 06/16/2023/11:10:05 AM    Final    CT CHEST ABDOMEN PELVIS W CONTRAST  Result Date: 06/15/2023 CLINICAL DATA:  Weakness.  Evaluate for occult malignancy. EXAM: CT CHEST, ABDOMEN, AND PELVIS WITH CONTRAST TECHNIQUE: Multidetector CT imaging of the chest, abdomen and pelvis was performed following the standard protocol during bolus administration of intravenous contrast. RADIATION DOSE REDUCTION: This exam was performed according to the departmental dose-optimization program which includes automated exposure control, adjustment of the mA and/or kV according to patient size and/or use of iterative reconstruction technique. CONTRAST:  OMNIPAQUE IOHEXOL 300 MG/ML  SOLN COMPARISON:  CT scan 04/18/2021 FINDINGS: CT CHEST FINDINGS Cardiovascular: The heart is normal in size. No pericardial effusion. The aorta is normal in caliber. Significant age advanced three-vessel coronary artery calcifications. Mediastinum/Nodes: No mediastinal or hilar mass or lymphadenopathy. Small scattered  lymph nodes are noted. The esophagus is grossly normal. Lungs/Pleura: No worrisome pulmonary lesions or pulmonary nodules. No pleural effusions or pleural nodules. Right upper lobe predominant tree-in-bud pattern suggesting chronic inflammation atypical infection such as MAC. No focal airspace consolidation, pulmonary edema or pleural effusions. The central tracheobronchial tree is unremarkable. Musculoskeletal: Mild symmetric bilateral gynecomastia. No lytic or sclerotic bone lesions. CT ABDOMEN PELVIS FINDINGS Hepatobiliary: No focal hepatic lesions or intrahepatic biliary dilatation. Layering sludge or dependent gallstones in the gallbladder no common bile duct dilatation. Pancreas: Normal Spleen: Normal Adrenals/Urinary Tract: Normal Stomach/Bowel: No significant findings. Vascular/Lymphatic: The aorta is normal in caliber. No dissection. Age advanced atherosclerotic calcifications involving the aorta and iliac arteries. The branch vessels are patent. The major venous structures are patent. No mesenteric or retroperitoneal mass or adenopathy. Small scattered lymph nodes are noted. Reproductive: The prostate gland and seminal vesicles are unremarkable. Other: No pelvic mass or adenopathy. No free pelvic fluid collections. No inguinal mass or adenopathy. No abdominal wall hernia or subcutaneous lesions. Musculoskeletal: No significant bony findings. IMPRESSION: 1. No acute abdominal/pelvic findings, mass lesions or adenopathy. 2. Age advanced three-vessel coronary artery calcifications. 3. Right upper lobe predominant tree-in-bud pattern suggesting chronic inflammation atypical infection such as MAC. 4. Layering sludge or dependent gallstones in the gallbladder. 5. Age advanced atherosclerotic calcifications involving the aorta and iliac arteries. Aortic Atherosclerosis (ICD10-I70.0). Electronically Signed   By: Rudie Meyer M.D.   On: 06/15/2023 13:23    Scheduled Meds:  amLODipine  5 mg Oral Daily   aspirin  EC  81 mg Oral Daily   atorvastatin  80 mg Oral QPM   clopidogrel  75 mg Oral Daily   enoxaparin (LOVENOX) injection  40 mg Subcutaneous Daily   insulin aspart  0-9 Units Subcutaneous TID WC   insulin aspart  6 Units Subcutaneous TID WC   insulin glargine-yfgn  25 Units Subcutaneous Daily   losartan  25 mg Oral Daily   metoprolol tartrate  25 mg Oral BID   [START ON 06/22/2023] Vitamin D (Ergocalciferol)  50,000 Units Oral Q7 days   Continuous Infusions:  sodium chloride 10 mL/hr at 06/17/23 0902     LOS: 3 days  Time spent: 39 mins  Jeryl Wilbourn Laural Benes, MD How to contact the [facility] Attending or Consulting provider 7A - 7P or covering provider during after hours 7P -7A, for this patient?  Check the care team in [facility] and look for a) attending/consulting TRH provider listed and b) the [facility] team listed Log into [email or web address] and use [place]'s universal password to access. If you do not have the password, please contact the hospital operator. Locate the [facility] provider you are looking for under [facility] and page to a number that you can be directly reached. If you still have difficulty reaching the provider, please page the [facility] (Director on Call) for the Hospitalists listed on amion for assistance.  06/17/2023, 11:51 AM

## 2023-06-17 NOTE — Progress Notes (Signed)
Patient had emesis episode times 1. When this Clinical research associate entered room Waylan Boga, LPN was in room assessing patient and messaging Dr. Laural Benes. VS taking, CBG rechecked and is 179. Patient did no eat lunch d/t nausea. Patient was diaphoretic. Temp 99.2. Lowered temp in patient room, removed covers from patient. Offered PRN tylenol patient refused d/t nausea. Notified of event. Dr. Laural Benes ordered antiemetic medications. PRN compazine given as ordered.   VS  Temp 99.2 BP 148/94 MAP 110 HR 74 RR 18 O2 100% on room air.

## 2023-06-18 DIAGNOSIS — I639 Cerebral infarction, unspecified: Secondary | ICD-10-CM | POA: Diagnosis not present

## 2023-06-18 LAB — GLUCOSE, CAPILLARY
Glucose-Capillary: 118 mg/dL — ABNORMAL HIGH (ref 70–99)
Glucose-Capillary: 134 mg/dL — ABNORMAL HIGH (ref 70–99)
Glucose-Capillary: 91 mg/dL (ref 70–99)

## 2023-06-18 MED ORDER — METOPROLOL TARTRATE 5 MG/5ML IV SOLN
2.5000 mg | Freq: Once | INTRAVENOUS | Status: AC
  Administered 2023-06-18: 2.5 mg via INTRAVENOUS
  Filled 2023-06-18: qty 5

## 2023-06-18 NOTE — Progress Notes (Signed)
PROGRESS NOTE   Zachary Winters  ZOX:[id number] DOB: Sep 07, 1981 DOA: 06/14/2023 PCP: System, Provider Not In   Chief Complaint  Patient presents with   Weakness   Level of care: Telemetry  Brief Admission History:  42 year old gentleman with poorly controlled diabetes mellitus on insulin, chronic daily smoker, hypertension, history of marijuana use, diabetic peripheral neuropathy, hyperlipidemia was sent to the emergency department by [facility] with right-sided weakness right-sided facial droop and difficulty speaking.  He was last known to be well 2 days ago at 6 AM when his sister noticed he went out to smoke on [place].  He apparently lives with his sister.  Sister noticed the patient lying on the floor having a difficult time communicating only able to nod yes and no.  She noticed that for the last 2 days he has been lying around and sleeping.  He was seen in the ED and noted to have a significant aphasia and right-sided hemiparesis.  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  His blood glucose was 109.  Code Stroke was not activated due to being 48 hours since last known well time.  Pt is being admitted into the hospital for further work up and treatment of acute CVA.       Subjective :  The patient was seen and examined this morning, stable awake alert following command, dense right hemiplegia Hemodynamically stable, no acute distress  assessment and Plan:  Acute CVA of left basal ganglia  -No changes-stable  -Pt presented with a dense right hemiparesis and aphasia that is unchanged -pt presented 2 days after last known well time  -MRI brain with findings of acute to subacute perforator infarct at the left basal ganglia and adjacent white matter with petechial hemorrhage. 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 -admitted for full stroke  work up and inpatient neurology consultation  -SLP recommending Dys 2 diet for now pending SLP follow up. -neurology recommending starting asp 81 mg with plavix 75 mg daily for 90 days followed by aspirin alone -started atorvastatin 80 mg daily  -2D echocardiogram and carotid dopplers ordered -TEE recommended by neurology. TEE completed on 6/21 - no thrombus identified.  -PT/OT/SLP evaluation ordered and recommending CIR evaluation for rehab  -lipid panel - suboptimally controlled; better glycemic control will also improve lipids  -aspiration precautions -Status post permissive hypertension -CT Chest/Abd/Pelvis completed to rule out occult malignancy - CT was negative for signs of malignancy but also reported Right upper lobe predominant tree-in-bud pattern suggesting chronic inflammation atypical infection such as MAC. Will refer him out to pulmonary clinic at discharge for further evaluation of this.  Also CT noted advanced coronary calcifications. Will refer him out to cardiology at discharge for surveillance.     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 -Continue to monitor CBG ACHS and provide SSI coverage as needed -try to avoid hypoglycemia -follow up A1c - 11.1% as evidence of poorly controlled disease  -follow up TSH - WNL CBG (last 3)  Recent Labs    06/17/23 1614 06/17/23 2052 06/18/23 0713  GLUCAP 136* 157* 118*  Adding prandial coverage for meals eaten >50% Adding basal coverage  25 units glargine daily     Essential Hypertension - next goal BP is 140-160 over next few days per neuro - added losartan 25 mg, metoprolol 25 mg BID, amlodipine 5 mg    Hyperlipidemia -  working on improving glycemic control - atorvastatin 80 mg daily ordered  Severe Vitamin D deficiency - Drisdol 50 k caps once weekly started    Tobacco Abuser - Pt advised to absolutely stop all tobacco use - nicotine patch ordered if needed - counseling on tobacco cessation  ongoing   Recreational Drug use - per history, will check a urine toxicology screen to see if he has been using recently - UDS positive for marijuana   - TOC consulted for substance abuse resources and counseling   Diabetic peripheral polyneuropathy - he hadn't been taking any home med for this recently - not currently complaining of symptoms - working on improving glycemic control     DVT prophylaxis: enoxaparin Code Status: Full  Family Communication: sister, grandmother 6/19 Disposition: Status is: Inpatient    Consultants:  Neurology  Cardiology for TEE PT/OT/SLP CIR for eval   Procedures:  TEE 06/16/23 with Dr Diona Browner  Antimicrobials:    Subjective: Pt not eating very much today.  No change in expressive aphasia or right hemiparesis.  He nods agreement with acute inpatient rehab.   Objective: Vitals:   06/17/23 1155 06/17/23 2053 06/17/23 2353 06/18/23 0419  BP: (!) 148/94 (!) 151/86 128/73 (!) 155/100  Pulse: 74 93 92 96  Resp: 18 18 19    Temp: 99.2 F (37.3 C) 99.1 F (37.3 C) 99.1 F (37.3 C) 98.8 F (37.1 C)  TempSrc: Oral Oral Oral Oral  SpO2: 100% 98% 100% 98%  Weight:      Height:        Intake/Output Summary (Last 24 hours) at 06/18/2023 1034 Last data filed at 06/18/2023 0700 Gross per 24 hour  Intake 0 ml  Output 975 ml  Net -975 ml   Filed Weights   06/14/23 0842 06/16/23 0828  Weight: 68 kg 68 kg   Examination: The patient was seen and examined, no changes to exam below Dense hemiplegia-unchanged General exam: Awake-following commands appears calm and comfortable, sitting up in chair, expressive aphasia, dense right hemiparesis unchanged, he is very alert and he can express understanding with nods and blinks Respiratory system: Clear to auscultation bilaterally negative wheezing or crackles Cardiovascular system: Sinus rhythm, S1, S2 Gastrointestinal system: Soft nontender positive bowel sounds  Central nervous system: Alert and  oriented. Expressive aphasia, dense right hemiparesis unchanged,  Extremities: dense right hemiparesis, positive pulses bilaterally Skin: No rashes, lesions or ulcers. Psychiatry: Judgement and insight UTD. Mood & affect UTD.   Data Reviewed: I have personally reviewed following labs and imaging studies  CBC: Recent Labs  Lab 06/14/23 0911 06/16/23 0416  WBC 10.0 7.4  NEUTROABS 7.5  --   HGB 13.1 11.1*  HCT 40.2 34.0*  MCV 91.2 91.4  PLT 278 237    Basic Metabolic Panel: Recent Labs  Lab 06/14/23 0911 06/15/23 0514 06/16/23 0416  NA 143 141 136  K 3.4* 3.6 3.8  CL 106 106 104  CO2 27 25 24   GLUCOSE 108* 157* 192*  BUN 19 16 14   CREATININE 1.57* 1.49* 1.45*  CALCIUM 9.2 8.6* 8.6*  MG  --  2.0 1.8    CBG: Recent Labs  Lab 06/17/23 1114 06/17/23 1151 06/17/23 1614 06/17/23 2052 06/18/23 0713  GLUCAP 205* 179* 136* 157* 118*    No results found for this or any previous visit (from the past 240 hour(s)).   Radiology Studies: No results found.  Scheduled Meds:  amLODipine  5 mg Oral Daily   aspirin EC  81 mg  Oral Daily   atorvastatin  80 mg Oral QPM   clopidogrel  75 mg Oral Daily   enoxaparin (LOVENOX) injection  40 mg Subcutaneous Daily   insulin aspart  0-9 Units Subcutaneous TID WC   insulin aspart  6 Units Subcutaneous TID WC   insulin glargine-yfgn  25 Units Subcutaneous Daily   losartan  25 mg Oral Daily   metoprolol tartrate  25 mg Oral BID   [START ON 06/22/2023] Vitamin D (Ergocalciferol)  50,000 Units Oral Q7 days   Continuous Infusions:  sodium chloride 10 mL/hr at 06/17/23 0902     LOS: 4 days   Time spent: 39 mins  Kendell Bane, MD How to contact the [facility] Attending or Consulting provider 7A - 7P or covering provider during after hours 7P -7A, for this patient?  Check the care team in [facility] and look for a) attending/consulting TRH provider listed and b) the [facility] team listed Log into [email or web address] and use [place]'s universal  password to access. If you do not have the password, please contact the hospital operator. Locate the [facility] provider you are looking for under [facility] and page to a number that you can be directly reached. If you still have difficulty reaching the provider, please page the [facility] (Director on Call) for the Hospitalists listed on amion for assistance.  06/18/2023, 10:34 AM

## 2023-06-18 NOTE — Progress Notes (Addendum)
PT has been coughing after being given anything to eat or drink. Pt seems to be having more trouble swallowing. MD notified MD verbal to keep pt NPO

## 2023-06-18 NOTE — Progress Notes (Signed)
PT has been asleep most of the day and has refused to eat. PT was given a boost per family's wishes and pt was able to drink most of it. Pt denies any pain or discomfort. Pt is alert and is able to respond by shaking his head yes or no. Pt has also been ask if staff can provide oral care such as teeth brushing and pt refuses. Family has been bedside this afternoon.

## 2023-06-18 NOTE — Progress Notes (Signed)
PT has been asleep most of the day and has refused to eat. PT was given a boost per family's wishes and pt was able to drink most of it. Pt denies any pain or discomfort. Pt is alert and is able to respond by shaking his head yes or no.

## 2023-06-19 ENCOUNTER — Inpatient Hospital Stay (HOSPITAL_COMMUNITY): Payer: Medicaid Other

## 2023-06-19 DIAGNOSIS — I639 Cerebral infarction, unspecified: Secondary | ICD-10-CM | POA: Diagnosis not present

## 2023-06-19 DIAGNOSIS — R482 Apraxia: Secondary | ICD-10-CM | POA: Diagnosis not present

## 2023-06-19 DIAGNOSIS — E1140 Type 2 diabetes mellitus with diabetic neuropathy, unspecified: Secondary | ICD-10-CM | POA: Diagnosis not present

## 2023-06-19 DIAGNOSIS — R4701 Aphasia: Secondary | ICD-10-CM | POA: Diagnosis not present

## 2023-06-19 LAB — GLUCOSE, CAPILLARY
Glucose-Capillary: 72 mg/dL (ref 70–99)
Glucose-Capillary: 75 mg/dL (ref 70–99)
Glucose-Capillary: 70 mg/dL (ref 70–99)
Glucose-Capillary: 87 mg/dL (ref 70–99)
Glucose-Capillary: 72 mg/dL (ref 70–99)

## 2023-06-19 LAB — HOMOCYSTEINE: Homocysteine: 20.9 umol/L — ABNORMAL HIGH (ref 0.0–14.5)

## 2023-06-19 LAB — LUPUS ANTICOAGULANT PANEL
DRVVT: 39.7 s (ref 0.0–47.0)
PTT Lupus Anticoagulant: 41.9 s (ref 0.0–43.5)

## 2023-06-19 LAB — PROTEIN S, TOTAL: Protein S Ag, Total: 145 % (ref 60–150)

## 2023-06-19 LAB — BETA-2-GLYCOPROTEIN I ABS, IGG/M/A
Beta-2 Glyco I IgG: <9 GPI IgG units (ref 0–20)
Beta-2-Glycoprotein I IgA: <9 GPI IgA units (ref 0–25)
Beta-2-Glycoprotein I IgM: <9 GPI IgM units (ref 0–32)

## 2023-06-19 LAB — PROTEIN S ACTIVITY: Protein S Activity: 116 % (ref 63–140)

## 2023-06-19 LAB — PROTEIN C ACTIVITY: Protein C Activity: 137 % (ref 73–180)

## 2023-06-19 MED ORDER — INSULIN GLARGINE-YFGN 100 UNIT/ML ~~LOC~~ SOLN: 15.0000 [IU] | Freq: Every day | SUBCUTANEOUS | Status: DC

## 2023-06-19 MED ORDER — SODIUM CHLORIDE 0.9 % IV SOLN: INTRAVENOUS | Status: DC

## 2023-06-19 MED ORDER — INSULIN ASPART 100 UNIT/ML IJ SOLN
0.0000 [IU] | Freq: Four times a day (QID) | INTRAMUSCULAR | Status: DC
  Administered 2023-06-21 – 2023-06-22 (×4): 1 [IU] via SUBCUTANEOUS
  Administered 2023-06-22 (×2): 2 [IU] via SUBCUTANEOUS
  Administered 2023-06-23 (×4): 3 [IU] via SUBCUTANEOUS
  Administered 2023-06-23: 2 [IU] via SUBCUTANEOUS
  Administered 2023-06-24 – 2023-06-25 (×5): 3 [IU] via SUBCUTANEOUS

## 2023-06-19 MED ORDER — INSULIN ASPART 100 UNIT/ML IJ SOLN: 3.0000 [IU] | Freq: Three times a day (TID) | INTRAMUSCULAR | Status: DC

## 2023-06-19 MED ORDER — INSULIN GLARGINE-YFGN 100 UNIT/ML ~~LOC~~ SOLN
12.0000 [IU] | Freq: Every day | SUBCUTANEOUS | Status: DC
  Filled 2023-06-19 (×2): qty 0.12

## 2023-06-19 NOTE — Progress Notes (Signed)
Pts CBG have been trending down. Pt is NPO and Md notified ordered to hold semglee.

## 2023-06-19 NOTE — Progress Notes (Signed)
Speech Language Pathology Treatment: Dysphagia  Patient Details Name: PAUL TRETTIN MRN: [id number] DOB: [birth year] Today's Date: 06/19/2023 Time: 0925-0950 SLP Time Calculation (min) (ACUTE ONLY): 25 min  Assessment / Plan / Recommendation Clinical Impression  Pt seen at bedside this AM due to reported change in swallow ability (Pt refusing oral care and PO and now made NPO). Pt nonverbal with this SLP, but occasional vocalizations heard with throat clearing and "hmmm". Pt readily accepted single ice chips and eventually sherbet via spoon (also self presented). Pt with oral holding, labial spillage and suspected delay in swallow trigger with resulting delayed cough. Will complete MBSS later this date. Pt with significant change in status compared to SLP noted from last Thursday and this was reported to RN. OK for oral care and PO medications crushed in puree if Pt is alert.     HPI HPI: KNUTE MAZZUCA is a 42 year old gentleman with poorly controlled diabetes mellitus on insulin, chronic daily smoker, hypertension, history of marijuana use, diabetic peripheral neuropathy, hyperlipidemia was sent to the emergency department by [facility] with right-sided weakness right-sided facial droop and difficulty speaking.  He was last known to be well 2 days ago at 6 AM when his sister noticed he went out to smoke on ring camera.  He apparently lives with his sister.  Sister noticed the patient lying on the floor having a difficult time communicating only able to nod yes and no.  She noticed that for the last 2 days he has been lying around and sleeping. He was seen in the ED and noted to have a significant aphasia and right-sided hemiparesis.  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      SLP Plan  MBS      Recommendations for follow up therapy are one component of a multi-disciplinary  discharge planning process, led by the attending physician.  Recommendations may be updated based on patient status, additional functional criteria and insurance authorization.    Recommendations  Diet recommendations: NPO Medication Administration: Crushed with puree                              MBS   Thank you,  [name], CCC-SLP [phone]   Jamal Pavon  06/19/2023, 2:22 PM

## 2023-06-19 NOTE — Progress Notes (Signed)
Physical Therapy Treatment Patient Details Name: Zachary Winters MRN: [id number] DOB: [birth year] Today's Date: 06/19/2023   History of Present Illness Zachary Winters is a 42 year old gentleman with poorly controlled diabetes mellitus on insulin, chronic daily smoker, hypertension, history of marijuana use, diabetic peripheral neuropathy, hyperlipidemia was sent to the emergency department by [facility] with right-sided weakness right-sided facial droop and difficulty speaking.  He was last known to be well 2 days ago at 6 AM when his sister noticed he went out to smoke on ring camera.  He apparently lives with his sister.  Sister noticed the patient lying on the floor having a difficult time communicating only able to nod yes and no.  She noticed that for the last 2 days he has been lying around and sleeping.    PT Comments    Patient agreeable for therapy, able to follow directions with occasional repeated verbal/tactile cueing mostly due to non-verbal.  Patient demonstrates slow labored movement for sitting up at bedside with most difficulty moving RLE due to weakness, spasticity, once seated frequent leaning right laterally and backwards requiring Min assist to prevent loss of balance.  Patient required active assistance for completing exercises and tolerated standing with Hemi-walker and limited to a couple of slow labored steps with mostly dragging of right foot, unable to take steps away from bedside due to fall risk.  Patient tolerated sitting up in chair with family members present in room after therapy - nurse aware. Patient will benefit from continued skilled physical therapy in hospital and recommended venue below to increase strength, balance, endurance for safe ADLs and gait.      Recommendations for follow up therapy are one component of a multi-disciplinary discharge planning process, led by the attending physician.  Recommendations may be updated based on patient status, additional  functional criteria and insurance authorization.  Follow Up Recommendations       Assistance Recommended at Discharge Set up Supervision/Assistance  Patient can return home with the following A lot of help with bathing/dressing/bathroom;A lot of help with walking and/or transfers;Help with stairs or ramp for entrance;Assistance with cooking/housework   Equipment Recommendations  None recommended by PT;Other (comment) (to be determined)    Recommendations for Other Services       Precautions / Restrictions Precautions Precautions: Fall Restrictions Weight Bearing Restrictions: No     Mobility  Bed Mobility Overal bed mobility: Needs Assistance Bed Mobility: Supine to Sit     Supine to sit: Mod assist     General bed mobility comments: increased time, diffiuclty moving RLE, unable to use RUE    Transfers Overall transfer level: Needs assistance Equipment used: Hemi-walker Transfers: Sit to/from Stand, Bed to chair/wheelchair/BSC Sit to Stand: Mod assist   Step pivot transfers: Mod assist       General transfer comment: able to hold onto hemiw-walker with left hand during sit to stand, but chose use armrest of chair for completing transfer    Ambulation/Gait Ambulation/Gait assistance: Max assist Gait Distance (Feet): 2 Feet Assistive device: Hemi-walker Gait Pattern/deviations: Decreased step length - right, Decreased stance time - right, Decreased step length - left, Knees buckling, Decreased stride length, Antalgic, Ataxic, Shuffle Gait velocity: slow     General Gait Details: limited to a couple of slow labored unsteady side steps with difficulty advancing RLE due to weak and Winters   Stairs             Wheelchair Mobility    Modified Rankin (Stroke Patients  Only)       Balance Overall balance assessment: Needs assistance Sitting-balance support: Feet supported, No upper extremity supported Sitting balance-Leahy Scale: Poor Sitting balance  - Comments: fair/poor seated at EOB Postural control: Right lateral lean, Posterior lean Standing balance support: Reliant on assistive device for balance, During functional activity, Single extremity supported Standing balance-Leahy Scale: Poor Standing balance comment: uisng Hemi-walker                            Cognition Arousal/Alertness: Awake/alert Behavior During Therapy: WFL for tasks assessed/performed Overall Cognitive Status: Within Functional Limits for tasks assessed                                          Exercises General Exercises - Lower Extremity Ankle Circles/Pumps: Seated, AAROM, Strengthening, Both, 5 reps Long Arc Quad: Seated, AAROM, Strengthening, Both, 10 reps Hip Flexion/Marching: Seated, AAROM, Strengthening, Both, 10 reps    General Comments        Pertinent Vitals/Pain Pain Assessment Pain Assessment: No/denies pain    Home Living                          Prior Function            PT Goals (current goals can now be found in the care plan section) Acute Rehab PT Goals Patient Stated Goal: return home PT Goal Formulation: With patient/family Time For Goal Achievement: 06/29/23 Potential to Achieve Goals: Good Progress towards PT goals: Progressing toward goals    Frequency    Min 4X/week      PT Plan Current plan remains appropriate    Co-evaluation              AM-PAC PT "6 Clicks" Mobility   Outcome Measure  Help needed turning from your back to your side while in a flat bed without using bedrails?: A Lot Help needed moving from lying on your back to sitting on the side of a flat bed without using bedrails?: A Lot Help needed moving to and from a bed to a chair (including a wheelchair)?: A Lot Help needed standing up from a chair using your arms (e.g., wheelchair or bedside chair)?: A Lot Help needed to walk in hospital room?: A Lot Help needed climbing 3-5 steps with a railing? :  Total 6 Click Score: 11    End of Session   Activity Tolerance: Patient tolerated treatment well;Patient limited by fatigue Patient left: in chair;with call bell/phone within reach;with family/visitor present Nurse Communication: Mobility status PT Visit Diagnosis: Unsteadiness on feet (R26.81);Other abnormalities of gait and mobility (R26.89);Muscle weakness (generalized) (M62.81)     Time: 1610-9604 PT Time Calculation (min) (ACUTE ONLY): 20 min  Charges:  $Therapeutic Exercise: 8-22 mins $Therapeutic Activity: 8-22 mins                     12:34 PM, 06/19/23 Ocie Bob, MPT Physical Therapist with [facility] 336 [phone] office [phone] mobile phone

## 2023-06-19 NOTE — Progress Notes (Signed)
Pts dad is bedside at this time. Family is trying to help this nurse encourage mouth care. Pt has is visibly  getting upset with with father by pushing him away while  he trying to complete mouth care. Father is noted to being stern with pt as pt is swatting him away. This nurse encourages family member to give the pt time and to try again at a later time.

## 2023-06-19 NOTE — Progress Notes (Signed)
Modified Barium Swallow Study  Patient Details  Name: Zachary Winters MRN: [id number] Date of Birth: 1981-12-08  Today's Date: 06/19/2023  Modified Barium Swallow completed.  Full report located under Chart Review in the Imaging Section.  History of Present Illness Zachary Winters is a 42 year old gentleman with poorly controlled diabetes mellitus on insulin, chronic daily smoker, hypertension, history of marijuana use, diabetic peripheral neuropathy, hyperlipidemia was sent to the emergency department by [facility] with right-sided weakness right-sided facial droop and difficulty speaking.  He was last known to be well 2 days ago at 6 AM when his sister noticed he went out to smoke on [place] camera.  He apparently lives with his sister.  Sister noticed the patient lying on the floor having a difficult time communicating only able to nod yes and no.  She noticed that for the last 2 days he has been lying around and sleeping. He was seen in the ED and noted to have a significant aphasia and right-sided hemiparesis.  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 Repeat MRI 06/19/23 shows extension of stroke and other involvement (see below). MBSS requested due to change in swallow function over the weekend.   Clinical Impression Pt presents with mod/severe oral phase dysphagia and mild/mod pharyngeal phase dysphagia characterized by weak lingual movement and bolus manipulation with labial spillage, oral holding, and spillover to the pharynx. Pt with variable swallow trigger, occasionally at the pyriforms. Pt with min decreased tongue base retraction, epiglottic deflection (improved with heavier/larger bolus), and hyolaryngeal excursion resulting in one episode of silent aspiration of nectar thick liquids and consistent penetration with thins/NTL. Pt accepted bolus better when he self presented to this  was implemented several times. Pt required cues to take only one sip (and liquids spilling out of oral cavity). Pt's significant cognitive communication deficits negatively impact safe and efficient PO consumption at this time. He is unlikely to meet nutritional needs in his current state, but could begin a D1/puree and HTL 1:1 in a supervised setting and oral suction available if he is transferred to inpatient rehab. For now, recommend setting up oral suction at bedside, offering single ice chips after oral care, and can present medications crushed as able in puree. Consider placement of coretrack at [facility] if alertness is an issue. Pt is at greatest risk of aspirating oral residuals that he is unable to clear (he has a difficult time initiating additional dry swallows after initial presentation- can dry dry spoon, cold stim etc). If alertness and oral phase improves, prognosis for efficient PO intake is good. Above discussed via secure chat with MD and RN.    Factors that may increase risk of adverse event in presence of aspiration Zachary Winters & Zachary Winters 2021): Reduced cognitive function;Limited mobility;Dependence for feeding and/or oral hygiene;Inadequate oral hygiene;Weak cough  Swallow Evaluation Recommendations Recommendations: Ice chips PRN after oral care;NPO except meds (consider coretrack at [facility] and 1:1 PO trials of puree and HTL with SLP) Liquid Administration via: Spoon;Cup Medication Administration: Crushed with puree Supervision: Full supervision/cueing for swallowing strategies;Staff to assist with self-feeding Swallowing strategies  : Slow rate;Small bites/sips;Check for pocketing or oral holding;Check for anterior loss;Multiple dry swallows after each bite/sip (have oral suction/oral care after meals) Postural changes: Position pt fully upright for meals;Stay upright 30-60 min after meals Oral care recommendations: Staff/trained caregiver to provide oral care;Oral care before ice  chips/water Recommended consults:  Consider dietitian consultation Caregiver Recommendations: Have oral suction available    Thank you,  Zachary Winters, CCC-SLP [phone]   Zachary Winters 06/19/2023,3:57 PM

## 2023-06-19 NOTE — Progress Notes (Signed)
Inpatient Rehabilitation Admissions Coordinator   I await medical workup completion to admit to CIR. I contacted sister and she is updated.  Ottie Glazier, RN, MSN Rehab Admissions Coordinator [phone] 06/19/2023 11:09 AM

## 2023-06-19 NOTE — TOC Progression Note (Signed)
Transition of Care [facility]) - Progression Note    Patient Details  Name: SIERRA BISSONETTE MRN: [id number] Date of Birth: 12-07-1981  [facility] CM/SW Contact  Karn Cassis, [place] Phone Number: 06/19/2023, 10:07 AM  Clinical Narrative:  Per CIR, pt has authorization. However, pt is having difficulty swallowing and is not medically stable at this time. TOC will follow.         Barriers to Discharge: Active Substance Use - Placement (CSW checked Epic/No CIR updated info yet.)  Expected Discharge Plan and Services                                               Social Determinants of Health (SDOH) Interventions SDOH Screenings   Food Insecurity: No Food Insecurity (10/31/2019)  Transportation Needs: No Transportation Needs (10/31/2019)  Depression (PHQ2-9): Medium Risk (01/21/2021)  Financial Resource Strain: Low Risk  (10/31/2019)  Physical Activity: Sufficiently Active (10/31/2019)  Social Connections: Socially Isolated (10/31/2019)  Stress: No Stress Concern Present (10/31/2019)  Tobacco Use: High Risk (06/16/2023)    Readmission Risk Interventions     No data to display

## 2023-06-19 NOTE — Progress Notes (Signed)
PROGRESS NOTE   Zachary Winters  ZOX:[id number] DOB: [birth year] DOA: 06/14/2023 PCP: System, Provider Not In   Chief Complaint  Patient presents with   Weakness   Level of care: Telemetry Medical  Brief Admission History:  42 year old gentleman with poorly controlled diabetes mellitus on insulin, chronic daily smoker, hypertension, history of marijuana use, diabetic peripheral neuropathy, hyperlipidemia was sent to the emergency department by [facility] with right-sided weakness right-sided facial droop and difficulty speaking.  He was last known to be well 2 days ago at 6 AM when his sister noticed he went out to smoke on ring camera.  He apparently lives with his sister.  Sister noticed the patient lying on the floor having a difficult time communicating only able to nod yes and no.  She noticed that for the last 2 days he has been lying around and sleeping.  He was seen in the ED and noted to have a significant aphasia and right-sided hemiparesis.  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  His blood glucose was 109.  Code Stroke was not activated due to being 48 hours since last known well time.  Pt is being admitted into the hospital for further work up and treatment of acute CVA.     Assessment and Plan:  Acute CVA of left basal ganglia  -Pt presented with a dense right hemiparesis and expressive aphasia and apraxia -pt presented 2 days after last known well time  -MRI brain done 06/14/23 with findings of acute to subacute perforator infarct at the left basal ganglia and adjacent white matter with petechial hemorrhage. 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 -admitted for full stroke work up and inpatient neurology consultation  -SLP recommending Dys 2 diet for now pending SLP follow up. -neurology recommending starting asp 81 mg with plavix 75 mg  daily for 90 days followed by aspirin alone -started atorvastatin 80 mg daily  -2D echocardiogram and carotid dopplers ordered -TEE recommended by neurology. TEE completed on 6/21 - no thrombus identified.  -PT/OT/SLP evaluation ordered and recommending CIR evaluation for rehab  -lipid panel - LDL suboptimally controlled at 265; better glycemic control will also improve lipids -atorvastatin 80 mg daily ordered  -aspiration precautions -try to maintain SBP 140-180 per neuro recs and avoid hypotension. BP meds on hold.  -CT Chest/Abd/Pelvis completed to rule out occult malignancy - CT was negative for signs of malignancy but also reported Right upper lobe predominant tree-in-bud pattern suggesting chronic inflammation atypical infection such as MAC. Will refer him out to pulmonary clinic at discharge for further evaluation of this.  Also CT noted advanced coronary calcifications. Will refer him out to cardiology at discharge for surveillance.  -TEE negative and labs drawn for hypercoag work up  -on 06/18/23 pt developed acute worsening of swallow function and made NPO pending speech therapy evaluation; repeat MRI brain ordered today to evaluate for stroke extension and MRI 06/19/23 demonstrates interval increase in size of acute left MCA territory infarction of the left basal ganglia and overlying frontal lobe, including new/interval infarct in the left frontal lobe. New/interval small acute infarct in the right posterior limb of the internal capsule.  I immediately reached out to neurologist Dr.Yadav with repeat MRI findings and her recommendation is to transfer patient to [facility] for evaluation by stroke team.  Maintain BP to avoid hypotension to goal of SBP 140-180. She  discussed case with Dr. Roda Shutters at [facility].  IV fluids ordered to help maintain BP.  I have reached out to patient placement and made arrangements to transfer patient to [facility] and signed out care to [facility] attending Dr. Ella Jubilee at [facility] I reached out to patient's  sister to notify her of the transfer to [facility] and reason for transfer. She verbalized understanding.     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 -Continue to monitor CBG ACHS and provide SSI coverage as needed -try to avoid hypoglycemia -follow up A1c - 11.1% as evidence of poorly controlled disease  -follow up TSH - WNL CBG (last 3)  Recent Labs    06/19/23 0739 06/19/23 0816 06/19/23 1157  GLUCAP 75 72 70  Holding basal insulin to avoid hypoglycemia  CBG every 4 hours ordered  Dysphagia as a result of CVA - discussed with speech therapist - recommendation is to keep NPO for now - recommendation is to have cortrak placed when transferred to [facility] - pt will need intensive speech therapy rehabilitation in CIR    Essential Hypertension - with new CVA changes, goal BP per neuro 140-180  - BP meds on hold for now     Hyperlipidemia - working on improving glycemic control - atorvastatin 80 mg daily ordered  Severe Vitamin D deficiency - Drisdol 50 k caps once weekly started    Tobacco Abuser - Pt advised to absolutely stop all tobacco use - nicotine patch ordered if needed - counseling on tobacco cessation ongoing   Recreational Drug use - per history, will check a urine toxicology screen to see if he has been using recently - UDS positive for marijuana   - TOC consulted for substance abuse resources and counseling   Diabetic peripheral polyneuropathy - he hadn't been taking any home med for this recently - not currently complaining of symptoms - working on improving glycemic control     DVT prophylaxis: enoxaparin Code Status: Full  Family Communication: sister, grandmother 6/19 Disposition: Status is: Inpatient    Consultants:  Neurology  Cardiology for TEE PT/OT/SLP CIR for eval   Procedures:  TEE 06/16/23 with Dr Diona Browner  Antimicrobials:    Subjective: Pt developed more difficulty swallowing yesterday and he is NPO pending a swallow  evaluation. Also, pt less interactive and more somnolent today, not cooperative with oral care, etc.  He remains aphasic.     Objective: Vitals:   06/18/23 0419 06/18/23 1300 06/18/23 1930 06/19/23 0429  BP: (!) 155/100 (!) 140/92 (!) 136/93 (!) 157/84  Pulse: 96 74 94 86  Resp:  18 18 20   Temp: 98.8 F (37.1 C) 98.5 F (36.9 C)  (!) 97.5 F (36.4 C)  TempSrc: Oral Oral  Oral  SpO2: 98% 100% 100% 98%  Weight:      Height:        Intake/Output Summary (Last 24 hours) at 06/19/2023 1536 Last data filed at 06/19/2023 0615 Gross per 24 hour  Intake 0 ml  Output 700 ml  Net -700 ml   Filed Weights   06/14/23 0842 06/16/23 0828  Weight: 68 kg 68 kg   Examination:  General exam: persistent expressive aphasia, pronounced right hemiparesis, somnolent Respiratory system: Clear to auscultation. Respiratory effort normal. Cardiovascular system: normal S1 & S2 heard. No JVD, murmurs, rubs, gallops or clicks. No pedal edema. Gastrointestinal system: Abdomen is nondistended, soft and nontender. No organomegaly or masses felt. Normal bowel sounds heard. Central nervous system: somnolent. Expressive aphasia, right hemiparesis, apraxia.  Extremities:  dense right hemiparesis Skin: No rashes, lesions or ulcers. Psychiatry: Judgement and insight UTD. Mood & affect seems flat, depressed.   Data Reviewed: I have personally reviewed following labs and imaging studies  CBC: Recent Labs  Lab 06/14/23 0911 06/16/23 0416  WBC 10.0 7.4  NEUTROABS 7.5  --   HGB 13.1 11.1*  HCT 40.2 34.0*  MCV 91.2 91.4  PLT 278 237    Basic Metabolic Panel: Recent Labs  Lab 06/14/23 0911 06/15/23 0514 06/16/23 0416  NA 143 141 136  K 3.4* 3.6 3.8  CL 106 106 104  CO2 27 25 24   GLUCOSE 108* 157* 192*  BUN 19 16 14   CREATININE 1.57* 1.49* 1.45*  CALCIUM 9.2 8.6* 8.6*  MG  --  2.0 1.8    CBG: Recent Labs  Lab 06/18/23 1117 06/18/23 1611 06/19/23 0739 06/19/23 0816 06/19/23 1157  GLUCAP  134* 91 75 72 70    No results found for this or any previous visit (from the past 240 hour(s)).   Radiology Studies: MR BRAIN WO CONTRAST  Result Date: 06/19/2023 CLINICAL DATA:  Neuro deficit, acute, stroke suspected CT down, checking for hemorrhagic conversion, new dysphagia symptoms EXAM: MRI HEAD WITHOUT CONTRAST TECHNIQUE: Multiplanar, multiecho pulse sequences of the brain and surrounding structures were obtained without intravenous contrast. COMPARISON:  MRI June 14, 2023. FINDINGS: Brain: Interval increase in size of an acute left MCA territory infarct in the left basal ganglia and overlying frontal lobe, including new/interval infarct in the left frontal lobe. Similar petechial hemorrhage without mass occupying acute hemorrhage. Associated edema without substantial midline shift. Small acute infarct involving the right posterior limb of the internal capsule. No hydrocephalus. Vascular: Major arterial flow voids are maintained skull base. Better evaluated on recent MRA. Skull and upper cervical spine: Normal marrow signal. Sinuses/Orbits: Mild paranasal sinus mucosal thickening. No acute orbital findings. Other: Left larger than right mastoid effusions IMPRESSION: 1. Interval increase in size of an acute left MCA territory infarct in the left basal ganglia and overlying frontal lobe, including new/interval infarct in the left frontal lobe. Similar petechial hemorrhage without mass occupying acute hemorrhage. 2. New/interval small acute infarct in the right posterior limb of the internal capsule. These results will be called to the ordering clinician or representative by the Radiologist Assistant, and communication documented in the PACS or [employer]. Electronically Signed   By: Feliberto Harts M.D.   On: 06/19/2023 12:38    Scheduled Meds:  aspirin EC  81 mg Oral Daily   atorvastatin  80 mg Oral QPM   clopidogrel  75 mg Oral Daily   enoxaparin (LOVENOX) injection  40 mg Subcutaneous  Daily   insulin aspart  0-9 Units Subcutaneous Q6H   [START ON 06/22/2023] Vitamin D (Ergocalciferol)  50,000 Units Oral Q7 days   Continuous Infusions:  sodium chloride 125 mL/hr at 06/19/23 1347     LOS: 5 days   Time spent: 49 mins  Zachary Hartung Laural Benes, MD How to contact the [facility] Attending or Consulting provider 7A - 7P or covering provider during after hours 7P -7A, for this patient?  Check the care team in [facility] and look for a) attending/consulting TRH provider listed and b) the [facility] team listed Log into [email or web address] and use [place]'s universal password to access. If you do not have the password, please contact the hospital operator. Locate the [facility] provider you are looking for under [facility] and page to a number that you can be directly reached. If  you still have difficulty reaching the provider, please page the [facility] (Director on Call) for the Hospitalists listed on amion for assistance.  06/19/2023, 3:36 PM

## 2023-06-19 NOTE — Progress Notes (Addendum)
I connected with  Zachary Winters on 06/14/23 by a video enabled telemedicine application and verified that I am speaking with the correct person using two identifiers.   I discussed the limitations of evaluation and management by telemedicine. The patient expressed understanding and agreed to proceed.   Location of patient: [facility] Location of physician: [facility]   Subjective: Reconsulted by hospitalist because patient had worsening swallowing.  Repeat MRI showed extension of previous dose and a new stroke.  Grandmother at bedside states patient is most likely noncompliant with all his home medications.  ROS: Unable to obtain likely due to aphasia  Examination  Vital signs in last 24 hours: Temp:  [97.5 F (36.4 C)] 97.5 F (36.4 C) (06/24 0429) Pulse Rate:  [86-94] 86 (06/24 0429) Resp:  [18-20] 20 (06/24 0429) BP: (136-157)/(84-93) 157/84 (06/24 0429) SpO2:  [98 %-100 %] 98 % (06/24 0429)  General: lying in bed, NAD Neuro:Neuro: awake, alert, did not look at examiner, did not follow any commands, did not name objects, right facial droop, rest of the CN appear intact, 0/5 in RUE, antigravity strength in RLE with drift, antigravity strength in LUE/LLE without drift, decreased sensation to touch on right   Basic Metabolic Panel: Recent Labs  Lab 06/14/23 0911 06/15/23 0514 06/16/23 0416  NA 143 141 136  K 3.4* 3.6 3.8  CL 106 106 104  CO2 27 25 24   GLUCOSE 108* 157* 192*  BUN 19 16 14   CREATININE 1.57* 1.49* 1.45*  CALCIUM 9.2 8.6* 8.6*  MG  --  2.0 1.8    CBC: Recent Labs  Lab 06/14/23 0911 06/16/23 0416  WBC 10.0 7.4  NEUTROABS 7.5  --   HGB 13.1 11.1*  HCT 40.2 34.0*  MCV 91.2 91.4  PLT 278 237     Coagulation Studies: No results for input(s): "LABPROT", "INR" in the last 72 hours.  Imaging MRI brain without contrast 06/19/2023: Interval increase in size of an acute left MCA territory infarctin the left basal ganglia and overlying  frontal lobe, including new/interval infarct in the left frontal lobe. Similar petechial hemorrhage without mass occupying acute hemorrhage. New/interval small acute infarct in the right posterior limb of the internal capsule.    ASSESSMENT AND PLAN:  42yo M with right hemiplegia, MRI brain showing left BG infarct   Acute ischemic stroke - etiology: likely embolic vs intracranial stenosis   Recommendations: -Continue aspirin 81 mg daily, Plavix 75 mg daily for 3 months -Continue atorvastatin 80 mg daily -Due to severe M1 stenosis, goal blood pressure is systolic 140-1 80 -Please keep patient hydrated to avoid drop in blood pressure -Discussed with Dr. Roda Shutters.  Recommend transfer to [facility].  Stroke team will follow and consider conventional angiogram - if symptoms recur, please make sure patient's head is flat, BP is at recommended goal and repeat CTH as MRI brain showing hemorrhagic conversion - PT/OT/swallow and speech eval - NIHSS per stroke protocol - Discussed plan with Dr Laural Benes  I have spent a total of   38 minutes with the patient reviewing hospital notes,  test results, labs and examining the patient as well as establishing an assessment and plan that was discussed personally with the patient.  > 50% of time was spent in direct patient care.   Lindie Spruce Epilepsy [facility] For questions after 5pm please refer to AMION to reach the Neurologist on call

## 2023-06-20 ENCOUNTER — Encounter (HOSPITAL_COMMUNITY): Payer: Self-pay | Admitting: Cardiology

## 2023-06-20 DIAGNOSIS — R4701 Aphasia: Secondary | ICD-10-CM | POA: Diagnosis not present

## 2023-06-20 DIAGNOSIS — E559 Vitamin D deficiency, unspecified: Secondary | ICD-10-CM | POA: Diagnosis not present

## 2023-06-20 DIAGNOSIS — I639 Cerebral infarction, unspecified: Secondary | ICD-10-CM | POA: Diagnosis not present

## 2023-06-20 DIAGNOSIS — F17200 Nicotine dependence, unspecified, uncomplicated: Secondary | ICD-10-CM | POA: Diagnosis not present

## 2023-06-20 LAB — GLUCOSE, CAPILLARY
Glucose-Capillary: 105 mg/dL — ABNORMAL HIGH (ref 70–99)
Glucose-Capillary: 109 mg/dL — ABNORMAL HIGH (ref 70–99)
Glucose-Capillary: 114 mg/dL — ABNORMAL HIGH (ref 70–99)
Glucose-Capillary: 116 mg/dL — ABNORMAL HIGH (ref 70–99)
Glucose-Capillary: 127 mg/dL — ABNORMAL HIGH (ref 70–99)
Glucose-Capillary: 129 mg/dL — ABNORMAL HIGH (ref 70–99)
Glucose-Capillary: 69 mg/dL — ABNORMAL LOW (ref 70–99)
Glucose-Capillary: 93 mg/dL (ref 70–99)
Glucose-Capillary: 97 mg/dL (ref 70–99)
Glucose-Capillary: 97 mg/dL (ref 70–99)

## 2023-06-20 LAB — CARDIOLIPIN ANTIBODIES, IGG, IGM, IGA
Anticardiolipin IgA: <9 APL U/mL (ref 0–11)
Anticardiolipin IgG: <9 GPL U/mL (ref 0–14)
Anticardiolipin IgM: <9 MPL U/mL (ref 0–12)

## 2023-06-20 LAB — PROTEIN C, TOTAL: Protein C, Total: 119 % (ref 60–150)

## 2023-06-20 MED ORDER — DEXTROSE 50 % IV SOLN
12.5000 g | INTRAVENOUS | Status: AC
  Administered 2023-06-20: 12.5 g via INTRAVENOUS
  Filled 2023-06-20: qty 50

## 2023-06-20 MED ORDER — LABETALOL HCL 5 MG/ML IV SOLN
10.0000 mg | Freq: Once | INTRAVENOUS | Status: AC
  Administered 2023-06-20: 10 mg via INTRAVENOUS
  Filled 2023-06-20: qty 4

## 2023-06-20 NOTE — Progress Notes (Signed)
PROGRESS NOTE   Zachary Winters  OZH:[id number] DOB: Jan 13, 1981 DOA: 06/14/2023 PCP: System, Provider Not In   Chief Complaint  Patient presents with   Weakness   Level of care: Telemetry Medical  Brief Admission History:  42 year old gentleman with poorly controlled diabetes mellitus on insulin, chronic daily smoker, hypertension, history of marijuana use, diabetic peripheral neuropathy, hyperlipidemia was sent to the emergency department by [facility] with right-sided weakness right-sided facial droop and difficulty speaking.  He was last known to be well 2 days ago at 6 AM when his sister noticed he went out to smoke on ring camera.  He apparently lives with his sister.  Sister noticed the patient lying on the floor having a difficult time communicating only able to nod yes and no.  She noticed that for the last 2 days he has been lying around and sleeping.  He was seen in the ED and noted to have a significant aphasia and right-sided hemiparesis.  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  His blood glucose was 109.  Code Stroke was not activated due to being 48 hours since last known well time.  Pt is being admitted into the hospital for further work up and treatment of acute CVA.     Assessment and Plan:  Acute CVA of left basal ganglia  -Pt presented with a dense right hemiparesis and expressive aphasia and apraxia -pt presented 2 days after last known well time  -MRI brain done 06/14/23 with findings of acute to subacute perforator infarct at the left basal ganglia and adjacent white matter with petechial hemorrhage. 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 -admitted for full stroke work up and inpatient neurology consultation  -SLP recommending Dys 2 diet for now pending SLP follow up. -neurology recommending starting asp 81 mg with plavix 75 mg  daily for 90 days followed by aspirin alone -started atorvastatin 80 mg daily  -2D echocardiogram and carotid dopplers ordered -TEE recommended by neurology. TEE completed on 6/21 - no thrombus identified.  -PT/OT/SLP evaluation ordered and recommending CIR evaluation for rehab  -lipid panel - LDL suboptimally controlled at 265; better glycemic control will also improve lipids -atorvastatin 80 mg daily ordered (currently he is NPO, may need to get a cortrak placed at [facility], cortrak not available at [facility])  -aspiration precautions -try to maintain SBP 140-180 per neuro recs and avoid hypotension. BP meds on hold.  -CT Chest/Abd/Pelvis completed to rule out occult malignancy - CT was negative for signs of malignancy but also reported Right upper lobe predominant tree-in-bud pattern suggesting chronic inflammation atypical infection such as MAC. Will refer him out to pulmonary clinic at discharge for further evaluation of this.  Also CT noted advanced coronary calcifications. Will refer him out to cardiology at discharge for surveillance.  -TEE negative and labs drawn for hypercoag work up  -on 06/18/23 pt developed acute worsening of swallow function and made NPO pending speech therapy evaluation; repeat MRI brain ordered today to evaluate for stroke extension and MRI 06/19/23 demonstrates interval increase in size of acute left MCA territory infarction of the left basal ganglia and overlying frontal lobe, including new/interval infarct in the left frontal lobe. New/interval small acute infarct in the right posterior limb of the internal capsule.  I immediately reached out to neurologist Dr.Yadav with repeat MRI findings and her recommendation is to [facility] to  [facility] for evaluation by stroke team.  Maintain BP to avoid hypotension to goal of SBP 140-180. She discussed case with Dr. Roda Shutters at [facility].  IV fluids ordered to help maintain BP.  I have reached out to patient placement and made arrangements to transfer  patient to [facility] and signed out care to [facility] attending Dr. Ella Jubilee at [facility]. I reached out to patient's sister to notify her of the transfer to [facility] and reason for transfer. She verbalized understanding.     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 -Continue to monitor CBG ACHS and provide SSI coverage as needed -try to avoid hypoglycemia -follow up A1c - 11.1% as evidence of poorly controlled disease  -follow up TSH - WNL CBG (last 3)  Recent Labs    06/20/23 0516 06/20/23 0745 06/20/23 1008  GLUCAP 97 93 105*  Holding basal insulin to avoid hypoglycemia  CBG every 2 hours ordered to try to avoid any hypoglycemia while NPO  Dysphagia as a result of CVA - discussed with speech therapist - recommendation is to keep NPO for now - recommendation is to have cortrak placed when transferred to [facility] - pt will need intensive speech therapy rehabilitation in CIR    Essential Hypertension - with new CVA changes, goal BP per neuro 140-180  - BP meds on hold for now     Hyperlipidemia - working on improving glycemic control - atorvastatin 80 mg daily ordered  Severe Vitamin D deficiency - Drisdol 50 k caps once weekly started    Tobacco Abuser - Pt advised to absolutely stop all tobacco use - nicotine patch ordered if needed - counseling on tobacco cessation ongoing   Recreational Drug use - per history, will check a urine toxicology screen to see if he has been using recently - UDS positive for marijuana   - TOC consulted for substance abuse resources and counseling   Diabetic peripheral polyneuropathy - he hadn't been taking any home med for this recently - not currently complaining of symptoms - working on improving glycemic control     DVT prophylaxis: enoxaparin Code Status: Full  Family Communication: sister, grandmother 6/19, 6/24 updated father and brother at bedside, 6/24 telephone call to sister with details of transfer and reason for transfer Disposition:  Status is: Inpatient    Consultants:  Neurology  Cardiology for TEE PT/OT/SLP CIR for eval   Procedures:  TEE 06/16/23 with Dr Diona Browner  Antimicrobials:    Subjective: Pt unchanged from yesterday, still awaiting bed for transfer to [facility].     Objective: Vitals:   06/19/23 1500 06/19/23 1933 06/19/23 2305 06/20/23 0630  BP: (!) 158/96 (!) 182/99 (!) 165/95 (!) 156/91  Pulse: 78 90 87 97  Resp: 18 18 18 18   Temp: 99.1 F (37.3 C)  98.6 F (37 C) 98.3 F (36.8 C)  TempSrc: Oral  Oral   SpO2: 99% 99% 99% 99%  Weight:      Height:        Intake/Output Summary (Last 24 hours) at 06/20/2023 1031 Last data filed at 06/20/2023 1018 Gross per 24 hour  Intake 0 ml  Output 1550 ml  Net -1550 ml   Filed Weights   06/14/23 0842 06/16/23 0828  Weight: 68 kg 68 kg   Examination:  General exam: persistent expressive aphasia, pronounced right hemiparesis, somnolent Respiratory system: Clear to auscultation. Respiratory effort normal. Cardiovascular system: normal S1 & S2 heard. No JVD, murmurs, rubs, gallops or clicks. No pedal edema. Gastrointestinal system: Abdomen is  nondistended, soft and nontender. No organomegaly or masses felt. Normal bowel sounds heard. Central nervous system: somnolent. Expressive aphasia, right hemiparesis, apraxia.  Extremities: dense right hemiparesis Skin: No rashes, lesions or ulcers. Psychiatry: Judgement and insight UTD. Mood & affect seems flat, depressed.   Data Reviewed: I have personally reviewed following labs and imaging studies  CBC: Recent Labs  Lab 06/14/23 0911 06/16/23 0416  WBC 10.0 7.4  NEUTROABS 7.5  --   HGB 13.1 11.1*  HCT 40.2 34.0*  MCV 91.2 91.4  PLT 278 237    Basic Metabolic Panel: Recent Labs  Lab 06/14/23 0911 06/15/23 0514 06/16/23 0416  NA 143 141 136  K 3.4* 3.6 3.8  CL 106 106 104  CO2 27 25 24   GLUCOSE 108* 157* 192*  BUN 19 16 14   CREATININE 1.57* 1.49* 1.45*  CALCIUM 9.2 8.6* 8.6*  MG  --  2.0  1.8    CBG: Recent Labs  Lab 06/20/23 0054 06/20/23 0249 06/20/23 0516 06/20/23 0745 06/20/23 1008  GLUCAP 129* 97 97 93 105*    No results found for this or any previous visit (from the past 240 hour(s)).   Radiology Studies: DG Swallowing Func-Speech Pathology  Result Date: 06/19/2023 Table formatting from the original result was not included. 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  He was last known to be well 2 days ago at 6 AM when his sister noticed he went out to smoke on ring camera.  He apparently lives with his sister.  Sister noticed the patient lying on the floor having a difficult time communicating only able to nod yes and no.  She noticed that for the last 2 days he has been lying around and sleeping. He was seen in the ED and noted to have a significant aphasia and right-sided hemiparesis.  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 Repeat MRI 06/19/23 shows extension of stroke and other involvement (see below). MBSS requested due to change in swallow function over the weekend. Clinical Impression: Clinical Impression: Pt presents with mod/severe oral phase dysphagia and mild/mod pharyngeal phase dysphagia characterized by weak lingual movement and bolus manipulation with labial spillage, oral holding, and spillover to the pharynx. Pt with variable swallow trigger, occasionally at the pyriforms. Pt with min decreased tongue base retraction,  epiglottic deflection (improved with heavier/larger bolus), and hyolaryngeal excursion resulting in one episode of silent aspiration of nectar thick liquids and consistent penetration with thins/NTL. Pt accepted bolus better when he self presented to this was implemented several times. Pt required cues to take only one sip (and liquids spilling out of oral cavity). Pt's significant cognitive communication deficits negatively impact safe and efficient PO consumption at this time. He is unlikely to meet nutritional needs in his current state, but could begin a D1/puree and HTL 1:1 in a supervised setting and oral suction available if he is transferred to inpatient rehab. For now, recommend setting up oral suction at bedside, offering single ice chips after oral care, and can present medications crushed as able in puree. Consider placement  of coretrack at [facility] if alertness is an issue. Pt is at greatest risk of aspirating oral residuals that he is unable to clear (he has a difficult time initiating additional dry swallows after initial presentation- can dry dry spoon, cold stim etc). If alertness and oral phase improves, prognosis for efficient PO intake is good. Above discussed via secure chat with MD and RN. Factors that may increase risk of adverse event in presence of aspiration [name] & [name] 2021): Factors that may increase risk of adverse event in presence of aspiration [name] & [name] 2021): Reduced cognitive function; Limited mobility; Dependence for feeding and/or oral hygiene; Inadequate oral hygiene; Weak cough Recommendations/Plan: Swallowing Evaluation Recommendations Swallowing Evaluation Recommendations Recommendations: Ice chips PRN after oral care; NPO except meds (consider coretrack at [facility] and 1:1 PO trials of puree and HTL with SLP) Liquid Administration via: Spoon; Cup Medication Administration: Crushed with puree Supervision: Full supervision/cueing for swallowing strategies; Staff to  assist with self-feeding Swallowing strategies  : Slow rate; Small bites/sips; Check for pocketing or oral holding; Check for anterior loss; Multiple dry swallows after each bite/sip (have oral suction/oral care after meals) Postural changes: Position pt fully upright for meals; Stay upright 30-60 min after meals Oral care recommendations: Staff/trained caregiver to provide oral care; Oral care before ice chips/water Recommended consults: Consider dietitian consultation Caregiver Recommendations: Have oral suction available Treatment Plan Treatment Plan Treatment recommendations: Therapy as outlined in treatment plan below Follow-up recommendations: Acute inpatient rehab (3 hours/day) Functional status assessment: Patient has had a recent decline in their functional status and demonstrates the ability to make significant improvements in function in a reasonable and predictable amount of time. Treatment frequency: Min 2x/week Treatment duration: 1 week Interventions: Aspiration precaution training; Oropharyngeal exercises; Patient/family education; Trials of upgraded texture/liquids Recommendations Recommendations for follow up therapy are one component of a multi-disciplinary discharge planning process, led by the attending physician.  Recommendations may be updated based on patient status, additional functional criteria and insurance authorization. Assessment: Orofacial Exam: Orofacial Exam Oral Cavity: Oral Hygiene: Lingual coating (halitosis) Oral Cavity - Dentition: Adequate natural dentition Orofacial Anatomy: WFL Oral Motor/Sensory Function: Suspected cranial nerve impairment CN V - Trigeminal: Not tested CN VII - Facial: Right motor impairment CN IX - Glossopharyngeal, CN X - Vagus: Right motor impairment Anatomy: Anatomy: Suspected cervical osteophytes Boluses Administered: Boluses Administered Boluses Administered: Thin liquids (Level 0); Mildly thick liquids (Level 2, nectar thick); Moderately thick  liquids (Level 3, honey thick); Puree  Oral Impairment Domain: Oral Impairment Domain Lip Closure: Escape beyond mid-chin Tongue control during bolus hold: Posterior escape of greater than half of bolus Bolus transport/lingual motion: Minimal-no tongue motion Oral residue: Majority of bolus remaining Location of oral residue : Floor of mouth; Tongue Initiation of pharyngeal swallow : Posterior laryngeal surface of the epiglottis  Pharyngeal Impairment Domain: Pharyngeal Impairment Domain Soft palate elevation: Trace column of contrast or air between SP and PW Laryngeal elevation: Partial superior movement of thyroid cartilage/partial approximation of arytenoids to epiglottic petiole Anterior hyoid excursion: Partial anterior movement Epiglottic movement: Partial inversion Laryngeal vestibule closure: Incomplete, narrow column air/contrast in laryngeal vestibule Pharyngeal stripping wave : Present - diminished Pharyngeal contraction (A/P view only): N/A Pharyngoesophageal segment opening: Partial distention/partial duration, partial obstruction of flow Tongue base retraction: Trace column of contrast or air between tongue base and PPW Pharyngeal residue: Collection of residue within or on pharyngeal structures Location of pharyngeal residue: Valleculae; Pyriform sinuses  Esophageal Impairment Domain: Esophageal Impairment Domain Esophageal clearance  upright position: Complete clearance, esophageal coating Pill: Pill Consistency administered: -- (not presented) Penetration/Aspiration Scale Score: Penetration/Aspiration Scale Score 1.  Material does not enter airway: Puree 2.  Material enters airway, remains ABOVE vocal cords then ejected out: Moderately thick liquids (Level 3, honey thick) 4.  Material enters airway, CONTACTS cords then ejected out: Thin liquids (Level 0) 7.  Material enters airway, passes BELOW cords and not ejected out despite cough attempt by patient: Mildly thick liquids (Level 2, nectar thick)  Compensatory Strategies: Compensatory Strategies Compensatory strategies: No   General Information: Caregiver present: No  Diet Prior to this Study: NPO   Temperature : Normal   Respiratory Status: WFL   Supplemental O2: None (Room air)   History of Recent Intubation: No  Behavior/Cognition: Alert; Pleasant mood; Requires cueing; Impulsive Self-Feeding Abilities: Needs assist with self-feeding Baseline vocal quality/speech: Not observed Volitional Cough: Unable to elicit Volitional Swallow: Unable to elicit Exam Limitations: Excessive movement (impusivity) Goal Planning: Prognosis for improved oropharyngeal function: Fair Barriers to Reach Goals: Cognitive deficits; Language deficits; Severity of deficits No data recorded No data recorded Consulted and agree with results and recommendations: Pt unable/family or caregiver not available; Nurse; Physician Pain: Pain Assessment Pain Assessment: No/denies pain End of Session: Start Time:SLP Start Time (ACUTE ONLY): 1430 Stop Time: SLP Stop Time (ACUTE ONLY): 1503 Time Calculation:SLP Time Calculation (min) (ACUTE ONLY): 33 min Charges: SLP Evaluations $ SLP Speech Visit: 1 Visit SLP Evaluations $MBS Swallow: 1 Procedure $Swallowing Treatment: 1 Procedure SLP visit diagnosis: SLP Visit Diagnosis: Dysphagia, oropharyngeal phase (R13.12) Past Medical History: Past Medical History: Diagnosis Date  Diabetes mellitus without complication (HCC)  Past Surgical History: No past surgical history on file. Thank you, [name], CCC-SLP [phone] PORTER,DABNEY 06/19/2023, 4:14 PM  MR BRAIN WO CONTRAST  Result Date: 06/19/2023 CLINICAL DATA:  Neuro deficit, acute, stroke suspected CT down, checking for hemorrhagic conversion, new dysphagia symptoms EXAM: MRI HEAD WITHOUT CONTRAST TECHNIQUE: Multiplanar, multiecho pulse sequences of the brain and surrounding structures were obtained without intravenous contrast. COMPARISON:  MRI June 14, 2023. FINDINGS: Brain: Interval  increase in size of an acute left MCA territory infarct in the left basal ganglia and overlying frontal lobe, including new/interval infarct in the left frontal lobe. Similar petechial hemorrhage without mass occupying acute hemorrhage. Associated edema without substantial midline shift. Small acute infarct involving the right posterior limb of the internal capsule. No hydrocephalus. Vascular: Major arterial flow voids are maintained skull base. Better evaluated on recent MRA. Skull and upper cervical spine: Normal marrow signal. Sinuses/Orbits: Mild paranasal sinus mucosal thickening. No acute orbital findings. Other: Left larger than right mastoid effusions IMPRESSION: 1. Interval increase in size of an acute left MCA territory infarct in the left basal ganglia and overlying frontal lobe, including new/interval infarct in the left frontal lobe. Similar petechial hemorrhage without mass occupying acute hemorrhage. 2. New/interval small acute infarct in the right posterior limb of the internal capsule. These results will be called to the ordering clinician or representative by the Radiologist Assistant, and communication documented in the PACS or [employer]. Electronically Signed   By: Feliberto Harts M.D.   On: 06/19/2023 12:38    Scheduled Meds:  aspirin EC  81 mg Oral Daily   atorvastatin  80 mg Oral QPM   clopidogrel  75 mg Oral Daily   enoxaparin (LOVENOX) injection  40 mg Subcutaneous Daily   insulin aspart  0-9 Units Subcutaneous Q6H   [START ON 06/22/2023] Vitamin D (Ergocalciferol)  50,000  Units Oral Q7 days   Continuous Infusions:  sodium chloride 125 mL/hr at 06/19/23 1347    LOS: 6 days   Time spent: 40 mins  Jantz Main Laural Benes, MD How to contact the [facility] Attending or Consulting provider 7A - 7P or covering provider during after hours 7P -7A, for this patient?  Check the care team in [facility] and look for a) attending/consulting [facility] provider listed and b) the [facility] team listed Log into  [email or web address] and use [place]'s universal password to access. If you do not have the password, please contact the hospital operator. Locate the [facility] provider you are looking for under [facility] and page to a number that you can be directly reached. If you still have difficulty reaching the provider, please page the [facility] (Director on Call) for the Hospitalists listed on amion for assistance.  06/20/2023, 10:31 AM

## 2023-06-20 NOTE — Progress Notes (Signed)
Gave report to [place] on [address].

## 2023-06-20 NOTE — Progress Notes (Signed)
Occupational Therapy Treatment Patient Details Name: KAJUAN GUYTON MRN: [id number] DOB: 07-Jul-1981 Today's Date: 06/20/2023   History of present illness JOHNTA COUTS is a 42 year old gentleman with poorly controlled diabetes mellitus on insulin, chronic daily smoker, hypertension, history of marijuana use, diabetic peripheral neuropathy, hyperlipidemia was sent to the emergency department by [facility] with right-sided weakness right-sided facial droop and difficulty speaking.  He was last known to be well 2 days ago at 6 AM when his sister noticed he went out to smoke on ring camera.  He apparently lives with his sister.  Sister noticed the patient lying on the floor having a difficult time communicating only able to nod yes and no.  She noticed that for the last 2 days he has been lying around and sleeping.   OT comments  Pt agreeable to OT treatment. Session conducted at bed level working on R UE weight bearing and R UE P/ROM. Mod A for bed mobility today with mod to max tone in R UE with P/ROM once pt was back in supine. Pt only able to shake head no and give a thumbs up using L UE for communication. Pt left in bed with bed alarm set and family present. Pt will benefit from continued OT in the hospital and recommended venue below to increase strength, balance, and endurance for safe ADL's.      Recommendations for follow up therapy are one component of a multi-disciplinary discharge planning process, led by the attending physician.  Recommendations may be updated based on patient status, additional functional criteria and insurance authorization.    Assistance Recommended at Discharge Frequent or constant Supervision/Assistance  Patient can return home with the following  A lot of help with walking and/or transfers;A lot of help with bathing/dressing/bathroom;Assistance with cooking/housework;Assistance with feeding;Assist for transportation;Direct supervision/assist for medications  management;Help with stairs or ramp for entrance   Equipment Recommendations  None recommended by OT    Recommendations for Other Services      Precautions / Restrictions Precautions Precautions: Fall Restrictions Weight Bearing Restrictions: No       Mobility Bed Mobility Overal bed mobility: Needs Assistance Bed Mobility: Supine to Sit, Sit to Supine     Supine to sit: Mod assist Sit to supine: Mod assist   General bed mobility comments: increased time, diffiuclty moving RLE, unable to use RUE    Transfers                         Balance Overall balance assessment: Needs assistance Sitting-balance support: Feet supported, No upper extremity supported Sitting balance-Leahy Scale: Poor Sitting balance - Comments: fair/poor seated at EOB                                    Extremity/Trunk Assessment Upper Extremity Assessment Upper Extremity Assessment: RUE deficits/detail RUE Deficits / Details: Significaly more tone in elbow and shoulder today. Mod to max tone noted during P/ROM.                              Cognition Arousal/Alertness: Awake/alert Behavior During Therapy: WFL for tasks assessed/performed Overall Cognitive Status: Within Functional Limits for tasks assessed  General Comments: Able to shake head no once and giving thumbs up for yest.        Exercises Exercises: General Upper Extremity, Other exercises General Exercises - Upper Extremity Shoulder Flexion: 5 reps, Right, Supine, PROM Shoulder ABduction: PROM, Right, 5 reps, Supine Elbow Flexion: 10 reps, PROM, Right, Supine Elbow Extension: PROM, 10 reps, Right, Supine Other Exercises Other Exercises: Working on R UE weight bearing via R lateral leans seated at EOB. First rep ws 5 secon hold followed by 3 reps at 10 second holds. Mod A for postrual stability throughout.                 Pertinent Vitals/  Pain       Pain Assessment Pain Assessment: No/denies pain                                                          Frequency  Min 2X/week        Progress Toward Goals  OT Goals(current goals can now be found in the care plan section)  Progress towards OT goals: Progressing toward goals  Acute Rehab OT Goals Patient Stated Goal: Agreeable to rehab to improve function. OT Goal Formulation: With patient Time For Goal Achievement: 06/29/23 Potential to Achieve Goals: Good ADL Goals Pt Will Perform Eating: with min guard assist;with adaptive utensils;sitting Pt Will Perform Grooming: with min assist;sitting Pt Will Perform Upper Body Bathing: with supervision;sitting Pt Will Perform Upper Body Dressing: with supervision;sitting Pt Will Perform Lower Body Dressing: with mod assist;bed level Pt Will Transfer to Toilet: with min guard assist;stand pivot transfer Pt Will Perform Toileting - Clothing Manipulation and hygiene: with min guard assist;sitting/lateral leans Pt/caregiver will Perform Home Exercise Program: Increased ROM;Increased strength;Right Upper extremity;With minimal assist Additional ADL Goal #1: Pt will demonstrate improved visual processing by locating objects in R field of view with set up assist 50% of the time.  Plan Discharge plan remains appropriate                                    End of Session    OT Visit Diagnosis: Unsteadiness on feet (R26.81);Other abnormalities of gait and mobility (R26.89);Muscle weakness (generalized) (M62.81);Other symptoms and signs involving the nervous system (R29.898);Cognitive communication deficit (R41.841);Hemiplegia and hemiparesis Symptoms and signs involving cognitive functions: Cerebral infarction Hemiplegia - Right/Left: Right Hemiplegia - dominant/non-dominant: Dominant Hemiplegia - caused by: Cerebral infarction   Activity Tolerance Patient tolerated treatment well   Patient  Left in bed;with call bell/phone within reach;with bed alarm set;with family/visitor present   Nurse Communication          Time: 0941-1000 OT Time Calculation (min): 19 min  Charges: OT General Charges $OT Visit: 1 Visit OT Treatments $Therapeutic Exercise: 8-22 mins  Fleetwood Pierron OT, MOT  Danie Chandler 06/20/2023, 10:18 AM

## 2023-06-20 NOTE — Progress Notes (Signed)
[facility] in to transport patient to [facility].

## 2023-06-20 NOTE — Progress Notes (Signed)
Notified pt's grandmother of pt's bed being available at [facility].

## 2023-06-20 NOTE — Plan of Care (Signed)
  Problem: Education: Goal: Knowledge of General Education information will improve Description: Including pain rating scale, medication(s)/side effects and non-pharmacologic comfort measures Outcome: Not Progressing   Problem: Health Behavior/Discharge Planning: Goal: Ability to manage health-related needs will improve Outcome: Not Progressing   Problem: Clinical Measurements: Goal: Ability to maintain clinical measurements within normal limits will improve Outcome: Not Progressing Goal: Will remain free from infection Outcome: Not Progressing Goal: Diagnostic test results will improve Outcome: Not Progressing Goal: Respiratory complications will improve Outcome: Not Progressing Goal: Cardiovascular complication will be avoided Outcome: Not Progressing   Problem: Activity: Goal: Risk for activity intolerance will decrease Outcome: Not Progressing   Problem: Nutrition: Goal: Adequate nutrition will be maintained Outcome: Not Progressing   Problem: Coping: Goal: Level of anxiety will decrease Outcome: Not Progressing   Problem: Elimination: Goal: Will not experience complications related to bowel motility Outcome: Not Progressing Goal: Will not experience complications related to urinary retention Outcome: Not Progressing   Problem: Pain Managment: Goal: General experience of comfort will improve Outcome: Not Progressing   Problem: Safety: Goal: Ability to remain free from injury will improve Outcome: Not Progressing   Problem: Skin Integrity: Goal: Risk for impaired skin integrity will decrease Outcome: Not Progressing   Problem: Education: Goal: Ability to describe self-care measures that may prevent or decrease complications (Diabetes Survival Skills Education) will improve Outcome: Not Progressing Goal: Individualized Educational Video(s) Outcome: Not Progressing   Problem: Coping: Goal: Ability to adjust to condition or change in health will  improve Outcome: Not Progressing   Problem: Fluid Volume: Goal: Ability to maintain a balanced intake and output will improve Outcome: Not Progressing   Problem: Health Behavior/Discharge Planning: Goal: Ability to identify and utilize available resources and services will improve Outcome: Not Progressing Goal: Ability to manage health-related needs will improve Outcome: Not Progressing   Problem: Metabolic: Goal: Ability to maintain appropriate glucose levels will improve Outcome: Not Progressing   Problem: Nutritional: Goal: Maintenance of adequate nutrition will improve Outcome: Not Progressing Goal: Progress toward achieving an optimal weight will improve Outcome: Not Progressing   Problem: Skin Integrity: Goal: Risk for impaired skin integrity will decrease Outcome: Not Progressing   Problem: Tissue Perfusion: Goal: Adequacy of tissue perfusion will improve Outcome: Not Progressing   Problem: Education: Goal: Knowledge of disease or condition will improve Outcome: Not Progressing Goal: Knowledge of secondary prevention will improve (MUST DOCUMENT ALL) Outcome: Not Progressing Goal: Knowledge of patient specific risk factors will improve Loraine Leriche N/A or DELETE if not current risk factor) Outcome: Not Progressing   Problem: Ischemic Stroke/TIA Tissue Perfusion: Goal: Complications of ischemic stroke/TIA will be minimized Outcome: Not Progressing   Problem: Coping: Goal: Will verbalize positive feelings about self Outcome: Not Progressing Goal: Will identify appropriate support needs Outcome: Not Progressing   Problem: Health Behavior/Discharge Planning: Goal: Ability to manage health-related needs will improve Outcome: Not Progressing Goal: Goals will be collaboratively established with patient/family Outcome: Not Progressing   Problem: Self-Care: Goal: Ability to participate in self-care as condition permits will improve Outcome: Not Progressing Goal:  Verbalization of feelings and concerns over difficulty with self-care will improve Outcome: Not Progressing Goal: Ability to communicate needs accurately will improve Outcome: Not Progressing   Problem: Nutrition: Goal: Risk of aspiration will decrease Outcome: Not Progressing Goal: Dietary intake will improve Outcome: Not Progressing

## 2023-06-20 NOTE — Progress Notes (Signed)
Physical Therapy Treatment Patient Details Name: Zachary Winters MRN: [id number] DOB: [birth year] Today's Date: 06/20/2023   History of Present Illness Zachary Winters is a 42 year old gentleman with poorly controlled diabetes mellitus on insulin, chronic daily smoker, hypertension, history of marijuana use, diabetic peripheral neuropathy, hyperlipidemia was sent to the emergency department by [facility] with right-sided weakness right-sided facial droop and difficulty speaking.  He was last known to be well 2 days ago at 6 AM when his sister noticed he went out to smoke on ring camera.  He apparently lives with his sister.  Sister noticed the patient lying on the floor having a difficult time communicating only able to nod yes and no.  She noticed that for the last 2 days he has been lying around and sleeping.    PT Comments    Patient agreeable for therapy.  Patient demonstrates slow labored movement for sitting up at bedside with most difficulty moving RLE, once seated had frequent posterior and right lateral leaning requiring frequent use of bed rail to maintain sitting balance, very unsteady on feet with poor carryover for attempting transfer using hemi-walker, had to lean on arm rest of chair with Max assist to transfer to chair.  Patient demonstrated fair/good return for completing LLE LAQ's, otherwise required Max verbal/tactile curing for completing BLE exercises.  Patient tolerated sitting up in chair with family members present after therapy - nursing staff notified.  Patient will benefit from continued skilled physical therapy in hospital and recommended venue below to increase strength, balance, endurance for safe ADLs and gait.      Recommendations for follow up therapy are one component of a multi-disciplinary discharge planning process, led by the attending physician.  Recommendations may be updated based on patient status, additional functional criteria and insurance authorization.  Follow  Up Recommendations       Assistance Recommended at Discharge    Patient can return home with the following A lot of help with bathing/dressing/bathroom;A lot of help with walking and/or transfers;Help with stairs or ramp for entrance;Assistance with cooking/housework   Equipment Recommendations  None recommended by PT;Other (comment)    Recommendations for Other Services       Precautions / Restrictions Precautions Precautions: Fall Restrictions Weight Bearing Restrictions: No     Mobility  Bed Mobility Overal bed mobility: Needs Assistance Bed Mobility: Supine to Sit     Supine to sit: Mod assist     General bed mobility comments: had to use bed rail, difficulty moving legs    Transfers Overall transfer level: Needs assistance Equipment used: 1 person hand held assist Transfers: Sit to/from Stand, Bed to chair/wheelchair/BSC Sit to Stand: Mod assist   Step pivot transfers: Mod assist, Max assist       General transfer comment: unsteady labored movement with frequent buckling of RLE due to weakness    Ambulation/Gait Ambulation/Gait assistance: Max assist Gait Distance (Feet): 2 Feet Assistive device: 1 person hand held assist Gait Pattern/deviations: Decreased step length - right, Decreased stance time - right, Decreased step length - left, Knees buckling, Decreased stride length, Ataxic, Shuffle Gait velocity: slow     General Gait Details: limited to a couple of shuffling side steps before having to sit due to RLE buckling with loss of balance   Stairs             Wheelchair Mobility    Modified Rankin (Stroke Patients Only)       Balance Overall balance assessment: Needs assistance  Sitting-balance support: Feet supported, No upper extremity supported Sitting balance-Leahy Scale: Poor Sitting balance - Comments: fair/poor seated at EOB   Standing balance support: Reliant on assistive device for balance, During functional activity, Single  extremity supported Standing balance-Leahy Scale: Poor Standing balance comment: leaning on armrest of chair                            Cognition Arousal/Alertness: Awake/alert Behavior During Therapy: WFL for tasks assessed/performed Overall Cognitive Status: Within Functional Limits for tasks assessed                                          Exercises General Exercises - Lower Extremity Ankle Circles/Pumps: Seated, AAROM, Strengthening, Both, 5 reps Long Arc Quad: Seated, AAROM, Strengthening, Both, 10 reps, AROM Hip Flexion/Marching: Seated, AAROM, Strengthening, Both, 10 reps    General Comments        Pertinent Vitals/Pain Pain Assessment Pain Assessment: No/denies pain    Home Living                          Prior Function            PT Goals (current goals can now be found in the care plan section) Acute Rehab PT Goals Patient Stated Goal: return home PT Goal Formulation: With patient/family Time For Goal Achievement: 06/29/23 Potential to Achieve Goals: Good Progress towards PT goals: Progressing toward goals    Frequency    Min 4X/week      PT Plan Current plan remains appropriate    Co-evaluation              AM-PAC PT "6 Clicks" Mobility   Outcome Measure  Help needed turning from your back to your side while in a flat bed without using bedrails?: A Lot Help needed moving from lying on your back to sitting on the side of a flat bed without using bedrails?: A Lot Help needed moving to and from a bed to a chair (including a wheelchair)?: A Lot Help needed standing up from a chair using your arms (e.g., wheelchair or bedside chair)?: A Lot Help needed to walk in hospital room?: A Lot Help needed climbing 3-5 steps with a railing? : Total 6 Click Score: 11    End of Session Equipment Utilized During Treatment: Gait belt Activity Tolerance: Patient tolerated treatment well;Patient limited by  fatigue Patient left: in chair;with call bell/phone within reach;with family/visitor present Nurse Communication: Mobility status PT Visit Diagnosis: Unsteadiness on feet (R26.81);Other abnormalities of gait and mobility (R26.89);Muscle weakness (generalized) (M62.81)     Time: 2130-8657 PT Time Calculation (min) (ACUTE ONLY): 32 min  Charges:  $Therapeutic Exercise: 8-22 mins $Therapeutic Activity: 8-22 mins                     12:19 PM, 06/20/23 Ocie Bob, MPT Physical Therapist with [facility] [phone] office [phone] mobile phone

## 2023-06-20 NOTE — Progress Notes (Signed)
Inpatient Rehabilitation Admissions Coordinator   Noted plans to transfer to acute at [facility]. I will follow up once transferred.  Ottie Glazier, RN, MSN Rehab Admissions Coordinator [phone] 06/20/2023 12:50 PM

## 2023-06-21 ENCOUNTER — Inpatient Hospital Stay (HOSPITAL_COMMUNITY): Payer: Medicaid Other

## 2023-06-21 DIAGNOSIS — E10649 Type 1 diabetes mellitus with hypoglycemia without coma: Secondary | ICD-10-CM

## 2023-06-21 DIAGNOSIS — I639 Cerebral infarction, unspecified: Secondary | ICD-10-CM | POA: Diagnosis not present

## 2023-06-21 LAB — PHOSPHORUS: Phosphorus: 2.8 mg/dL (ref 2.5–4.6)

## 2023-06-21 LAB — CBC
HCT: 27.9 % — ABNORMAL LOW (ref 39.0–52.0)
Hemoglobin: 9.2 g/dL — ABNORMAL LOW (ref 13.0–17.0)
MCH: 29.3 pg (ref 26.0–34.0)
MCHC: 33 g/dL (ref 30.0–36.0)
MCV: 88.9 fL (ref 80.0–100.0)
Platelets: 252 10*3/uL (ref 150–400)
RBC: 3.14 MIL/uL — ABNORMAL LOW (ref 4.22–5.81)
RDW: 12.8 % (ref 11.5–15.5)
WBC: 9.8 10*3/uL (ref 4.0–10.5)
nRBC: 0 % (ref 0.0–0.2)

## 2023-06-21 LAB — BASIC METABOLIC PANEL
Anion gap: 16 — ABNORMAL HIGH (ref 5–15)
BUN: 9 mg/dL (ref 6–20)
CO2: 19 mmol/L — ABNORMAL LOW (ref 22–32)
Calcium: 8.5 mg/dL — ABNORMAL LOW (ref 8.9–10.3)
Chloride: 110 mmol/L (ref 98–111)
Creatinine, Ser: 1.57 mg/dL — ABNORMAL HIGH (ref 0.61–1.24)
GFR, Estimated: 57 mL/min — ABNORMAL LOW (ref >60)
Glucose, Bld: 133 mg/dL — ABNORMAL HIGH (ref 70–99)
Potassium: 3.6 mmol/L (ref 3.5–5.1)
Sodium: 145 mmol/L (ref 135–145)

## 2023-06-21 LAB — GLUCOSE, CAPILLARY
Glucose-Capillary: 117 mg/dL — ABNORMAL HIGH (ref 70–99)
Glucose-Capillary: 118 mg/dL — ABNORMAL HIGH (ref 70–99)
Glucose-Capillary: 120 mg/dL — ABNORMAL HIGH (ref 70–99)
Glucose-Capillary: 127 mg/dL — ABNORMAL HIGH (ref 70–99)
Glucose-Capillary: 142 mg/dL — ABNORMAL HIGH (ref 70–99)

## 2023-06-21 LAB — MAGNESIUM: Magnesium: 2 mg/dL (ref 1.7–2.4)

## 2023-06-21 MED ORDER — ASPIRIN 81 MG PO CHEW
81.0000 mg | CHEWABLE_TABLET | Freq: Every day | ORAL | Status: DC
  Administered 2023-06-21 – 2023-06-27 (×7): 81 mg
  Filled 2023-06-21 (×7): qty 1

## 2023-06-21 MED ORDER — IOHEXOL 350 MG/ML SOLN
75.0000 mL | Freq: Once | INTRAVENOUS | Status: AC | PRN
  Administered 2023-06-21: 75 mL via INTRAVENOUS

## 2023-06-21 MED ORDER — GLUCERNA 1.5 CAL PO LIQD
1000.0000 mL | ORAL | Status: DC
  Administered 2023-06-21 – 2023-06-25 (×6): 1000 mL
  Filled 2023-06-21 (×8): qty 1000

## 2023-06-21 MED ORDER — POLYETHYLENE GLYCOL 3350 17 G PO PACK: 17.0000 g | PACK | Freq: Every day | ORAL | Status: DC | PRN

## 2023-06-21 MED ORDER — CLOPIDOGREL BISULFATE 75 MG PO TABS
75.0000 mg | ORAL_TABLET | Freq: Every day | ORAL | Status: DC
  Administered 2023-06-22 – 2023-06-27 (×6): 75 mg via NASOGASTRIC
  Filled 2023-06-21 (×6): qty 1

## 2023-06-21 NOTE — Progress Notes (Signed)
Speech Language Pathology Treatment: Dysphagia;Cognitive-Linquistic  Patient Details Name: Zachary Winters MRN: [id number] DOB: [birth year] Today's Date: 06/21/2023 Time: 5638-7564 SLP Time Calculation (min) (ACUTE ONLY): 23 min  Assessment / Plan / Recommendation Clinical Impression  Pt seen for ongoing dysphagia and communication assessment and intervention following transfer from [facility].  Pt with extension of stroke on 6/24.  Significant R facial asymmetry.  MBS 6/24 with recs for NPO: "Pt presents with mod/severe oral phase dysphagia and mild/mod pharyngeal phase dysphagia characterized by weak lingual movement and bolus manipulation with labial spillage, oral holding, and spillover to the pharynx." There was no penetration of puree and only transient penetration of HTL, but cognition is a barrier to initiating diet.   SWALLOWING Pt with decreased oral manipulation of ice chip.  Multiple swallows observed with immediate, strong cough and watery eyes.  SLP used oral suction to assist in clearance.  There was intermittent coughing with additional trials of thin liquid by spoon and with puree.  Pt exhibited fair oral clearance with anterior residue noted later in session, suggesting that fatigue may be impacting function.  Pt is not yet ready for repeat instrumental swallow evaluation.  SLP will continue to follow for PO readiness.   COMMUNICATION Pt presents with severe-profound expressive deficits and severe receptive language deficits.  He did not phonate during this session.  Tandem rote speech tasks and choral singing were not beneficial.  Pt demonstrated ability to nod/shake head.  He was able to answer personal questions with 57% accuracy.  Pt followed simple 1 step commands with 66% accuracy.  Pt demonstrated difficulty with more complex single step command (ex: show me two fingers).   Pt would benefit from continue ST intervention in house and intensive therapy at next level of care to address  the above noted deficits.  Consider IPR placement if appropriate.      HPI HPI: Zachary Winters is a 42 year old gentleman who presented to [facility] ED by EMS with right-sided weakness right-sided facial droop and difficulty speaking.  He was last known to be well 2 days PTA at 6 AM when his sister, with whom he lives, noticed he went out to smoke on ring camera. CT of the head with findings of moderate-sized subacute infarct in the left basal ganglia/corona radiata with probable petechial hemorrhage. Repeat MRI 06/19/23 shows extension of stroke and other involvement (see below). MBS 6/24 with recs for NPO, now with cortrak  Pt with hx poorly controlled diabetes mellitus on insulin, chronic daily smoker, hypertension, history of marijuana use, diabetic peripheral neuropathy, hyperlipidemia      SLP Plan  Continue with current plan of care      Recommendations for follow up therapy are one component of a multi-disciplinary discharge planning process, led by the attending physician.  Recommendations may be updated based on patient status, additional functional criteria and insurance authorization.    Recommendations  Diet recommendations: NPO Medication Administration: Via alternative means                  Oral care QID     Dysphagia, oropharyngeal phase (R13.12);Aphasia (R47.01)     Continue with current plan of care     Kerrie Pleasure, MA, CCC-SLP Acute Rehabilitation Services Office: [phone] 06/21/2023, 11:05 AM

## 2023-06-21 NOTE — Progress Notes (Signed)
Initial Nutrition Assessment  DOCUMENTATION CODES:  Not applicable  INTERVENTION:  Initiate tube feeding via cortrak tube: Glucerna 1.5 at 60 ml/h (1440 ml per day) Start at 9mL/h and increase by 10mL q8h to goal of 60 Free water 150 q4h once IVF discontinued Provides 2160 kcal, 119 gm protein, 1093 ml free water daily ( TF+flush) Recommend scheduled bowel regimen - no BM x 5 days New measured weight  NUTRITION DIAGNOSIS:   Inadequate oral intake related to dysphagia as evidenced by NPO status.  GOAL:   Patient will meet greater than or equal to 90% of their needs  MONITOR:   TF tolerance, Diet advancement, Labs, Weight trends  REASON FOR ASSESSMENT:   Other (Comment) (cortrak)    ASSESSMENT:   Pt with hx of DM, HTN, HLD, and tobacco abuse presented to [facility] with right-sided weakness and facial droop. Workup in ED revealed an acute ischemic stroke.  6/19 - presented to [facility] 6/20 - SLP evaluation, DYS2, thin 6/21 - TEE 6/23 - change in status, coughing after eating, NPO 6/24 - MBS, SLP recommends NPO 6/25 - transfer to [facility] 6/26 - cortrak tube (gastric)  Pt resting in bed at the time of assessment. Sleepy and did not interact during exam. Grandmother at bedside assists with hx. Also called sister this AM prior to cortrak placement and discussed nutrition plan. Pt has been without nutrition since 6/23. Most recent MBS on 6/24 showed significant swallowing impairments and SLP recommended cortrak placement as an alternate means of nutrition.    Noted no BM x 5 days. Discussed with MD and RN. Pt would benefit from a scheduled bowel regimen.   Grandmother also noted that pt has a significant smoking hx and that he was receiving a nicotine patch at [facility]. Noted order remains in place prn.   Sister reports that pt's appetite is generally pretty good at home. Grandmother reports that she has not noticed any significant weight changes. Will start TF slowly and  advance up to monitor for refeeding but do not feel pt is at significant risk based on family report and well nourished state   Intake/Output Summary (Last 24 hours) at 06/21/2023 1136 Last data filed at 06/21/2023 0543 Gross per 24 hour  Intake 2561.36 ml  Output 900 ml  Net 1661.36 ml  Net IO Since Admission: -2,319.75 mL [06/21/23 1136]  Nutritionally Relevant Medications: Scheduled Meds:  atorvastatin  80 mg Oral QPM   insulin aspart  0-9 Units Subcutaneous Q6H   [START ON 06/22/2023] Vitamin D (Ergocalciferol)  50,000 Units Oral Q7 days   Continuous Infusions:  sodium chloride 125 mL/hr at 06/21/23 0425   PRN Meds: ondansetron, prochlorperazine, senna-docusate  Labs Reviewed: Creatinine 1.57 CBG ranges from 93-127 mg/dL over the last 24 hours HgbA1c 11.7% (6/19)  NUTRITION - FOCUSED PHYSICAL EXAM: Flowsheet Row Most Recent Value  Orbital Region No depletion  Upper Arm Region No depletion  Thoracic and Lumbar Region No depletion  Buccal Region No depletion  Temple Region No depletion  Clavicle Bone Region No depletion  Clavicle and Acromion Bone Region Mild depletion  Scapular Bone Region No depletion  Dorsal Hand No depletion  Patellar Region Mild depletion  Anterior Thigh Region Mild depletion  Posterior Calf Region No depletion  Edema (RD Assessment) None  Hair Reviewed  Eyes Reviewed  Skin Reviewed  Nails Reviewed   Diet Order:   Diet Order             Diet NPO  time specified  Diet effective now                   EDUCATION NEEDS:   Not appropriate for education at this time  Skin:  Skin Assessment: Reviewed RN Assessment  Last BM:  6/21 - type 6  Height:   Ht Readings from Last 1 Encounters:  06/16/23 6\' 1"  (1.854 m)    Weight:   Wt Readings from Last 1 Encounters:  06/16/23 68 kg    Ideal Body Weight:  83.6 kg  BMI:  Body mass index is 19.78 kg/m.  Estimated Nutritional Needs:   Kcal:  2000-2300 kcal/d  Protein:   95-115g/d  Fluid:  2-2.3L/d    Greig Castilla, RD, LDN Clinical Dietitian RD pager # available in [facility]  After hours/weekend pager # available in [facility]

## 2023-06-21 NOTE — Progress Notes (Signed)
Physical Therapy Treatment Patient Details Name: Zachary Winters MRN: [id number] DOB: 01-19-1981 Today's Date: 06/21/2023   History of Present Illness [name] is a 42 year old gentleman who presents 6/19 to the [facility] emergency department by [facility] with right-sided weakness right-sided facial droop and difficulty speaking. 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 6/23 pt developed acute worsening of swallow function; MRI showing interval increase in size of acute L MCA infarction.Transported to [facility] 6/25. History of poorly controlled diabetes mellitus on insulin, chronic daily smoker, hypertension, history of marijuana use, diabetic peripheral neuropathy, and hyperlipidemia.    PT Comments    Session today focused on progressing bed mobility and functional transferring. Pt with verbal cues to initiate R side roll to reach for bed rail with grasp, requires mod A x2 person to achieve upright seated posture and EOB dangle. Presents with intermittent posterior and R lateral lean that he can self correct by grasping the foot of the bed rail with LUE, although cannot maintain for extended periods of time. Pt with medial/lateral unsteadiness on x2 person HHA to stand, stedy introduced for serial sit to stands and promotion of midline alignment. Mod A x2 person within the stedy, maintains grasp with LUE and intermittent RUE, responds well to multimodal cues for weight-shifting. Overall pt with slight impulsivity for movement and decreased level of communication but engages with therapy in a positive manner. Pt will continue to benefit from skilled PT during their acute admission to facilitate progress in listed deficits, and will from benefit from intensive inpatient follow up therapy, >3 hours/day.   Recommendations for  follow up therapy are one component of a multi-disciplinary discharge planning process, led by the attending physician.  Recommendations may be updated based on patient status, additional functional criteria and insurance authorization.  Follow Up Recommendations       Assistance Recommended at Discharge Frequent or constant Supervision/Assistance  Patient can return home with the following Two people to help with walking and/or transfers;Two people to help with bathing/dressing/bathroom;Assistance with cooking/housework;Assistance with feeding;Direct supervision/assist for financial management;Direct supervision/assist for medications management;Assist for transportation;Help with stairs or ramp for entrance   Equipment Recommendations  Other (comment) (to be determined in next venue of care)    Recommendations for Other Services       Precautions / Restrictions Precautions Precautions: Fall Restrictions Weight Bearing Restrictions: No     Mobility  Bed Mobility Overal bed mobility: Needs Assistance Bed Mobility: Supine to Sit     Supine to sit: Mod assist, +2 for physical assistance Sit to supine: Max assist, +2 for physical assistance   General bed mobility comments: Able to initiate log roll with cueing, maintains graps to bed rail. Mod A required to achieve upright torso EOB with HOB elevated. Assist required for legs to achieve dangle. On return to supine with more assist.    Transfers Overall transfer level: Needs assistance Equipment used: Ambulation equipment used, 2 person hand held assist (stedy) Transfers: Sit to/from Stand, Bed to chair/wheelchair/BSC Sit to Stand: Mod assist, +2 physical assistance           General transfer comment: Unsteady with 1x HHA, increased medial/lateral sway and legs resting against the bed. Introduced stedy, pt able to grasp with LUE to pull to stand with mod A x2 person. 4 repetitions of sit to stand  performed     Ambulation/Gait             Pre-gait activities: weight shifting in stedy, verbal/tactile/external cues provided for midline shift and weight bearing through either side. Serial sit to stands     Social research officer, government Rankin (Stroke Patients Only) Modified Rankin (Stroke Patients Only) Pre-Morbid Rankin Score: No symptoms Modified Rankin: Severe disability     Balance Overall balance assessment: Needs assistance Sitting-balance support: Feet supported, Single extremity supported Sitting balance-Leahy Scale: Poor Sitting balance - Comments: intermittent with LUE to grab foot of bed rail to maintain seated with no therapist assist. Fatigues quickly or releases grasp and loses seated balance. Able to correct with verbal cueing and manual facilitation Postural control: Right lateral lean, Posterior lean Standing balance support: Reliant on assistive device for balance, During functional activity, Single extremity supported Standing balance-Leahy Scale: Poor Standing balance comment:  (Double knee block with stedy, LUE grasps and RUE intermittently graps but requires manual facilitation to make initial contact. Midline shifted to the R but able to correct with max cueing)                            Cognition Arousal/Alertness: Awake/alert Behavior During Therapy: WFL for tasks assessed/performed Overall Cognitive Status: Difficult to assess Area of Impairment: Safety/judgement, Awareness, Problem solving, Attention, Memory, Following commands                   Current Attention Level: Focused Memory: Decreased short-term memory Following Commands: Follows one step commands with increased time, Follows multi-step commands inconsistently Safety/Judgement: Decreased awareness of safety, Decreased awareness of deficits Awareness: Intellectual Problem Solving: Slow processing, Decreased initiation, Difficulty sequencing,  Requires verbal cues, Requires tactile cues General Comments: Shakes head no to several different questions and encouragement, unable to determine if intentional as pt never nodded positively. Impulsive with movements        Exercises      General Comments        Pertinent Vitals/Pain Pain Assessment Pain Assessment: Faces Faces Pain Scale: No hurt    Home Living                          Prior Function            PT Goals (current goals can now be found in the care plan section) Acute Rehab PT Goals Patient Stated Goal: none stated PT Goal Formulation: With patient Time For Goal Achievement: 06/29/23 Potential to Achieve Goals: Good Progress towards PT goals: Progressing toward goals    Frequency    Min 4X/week      PT Plan Current plan remains appropriate    Co-evaluation              AM-PAC PT "6 Clicks" Mobility   Outcome Measure  Help needed turning from your back to your side while in a flat bed without using bedrails?: A Lot Help needed moving from lying on your back to sitting on the side of a flat bed without using bedrails?: A Lot Help needed moving to and from a bed to a chair (including a wheelchair)?: A Lot Help needed standing up from a chair using your arms (e.g., wheelchair or bedside chair)?: A Lot Help needed to walk in hospital room?: A Lot Help needed climbing 3-5 steps  with a railing? : Total 6 Click Score: 11    End of Session Equipment Utilized During Treatment: Gait belt Activity Tolerance: Patient tolerated treatment well Patient left: in bed;with call bell/phone within reach;with bed alarm set Nurse Communication: Mobility status PT Visit Diagnosis: Unsteadiness on feet (R26.81);Other abnormalities of gait and mobility (R26.89);Muscle weakness (generalized) (M62.81)     Time: 1425-1450 PT Time Calculation (min) (ACUTE ONLY): 25 min  Charges:  $Therapeutic Activity: 8-22 mins $Neuromuscular Re-education: 8-22  mins                     Hendricks Milo, SPT  Acute Rehabilitation Services    Hendricks Milo 06/21/2023, 4:11 PM

## 2023-06-21 NOTE — Progress Notes (Addendum)
STROKE TEAM PROGRESS NOTE   INTERVAL HISTORY [name] with his grandmother at the bedside.  He was initially admitted to [facility] on 6/19 with right hemiplegia and aphasia.  He presented outside the window for TNK and thrombectomy.  He was found to have a left basal ganglia infarct, which is now extended into much of the left MCA territory, and he also developed a small acute infarct in posterior limb of right internal capsule.  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  Yesterday, he was transferred here to be seen by stroke team and possibly for conventional angiogram given severe stenosis.  Vitals:   06/20/23 2107 06/20/23 2332 06/21/23 0409 06/21/23 0721  BP: (!) 190/94 (!) 157/89 (!) 163/94 (!) 181/87  Pulse: 93 100 (!) 109 (!) 103  Resp:  (!) 21 19 18   Temp:  98.5 F (36.9 C) 98.2 F (36.8 C) 98.3 F (36.8 C)  TempSrc:  Oral Oral   SpO2:  100% 100% 100%  Weight:      Height:       CBC:  Recent Labs  Lab 06/16/23 0416 06/21/23 0743  WBC 7.4 9.8  HGB 11.1* 9.2*  HCT 34.0* 27.9*  MCV 91.4 88.9  PLT 237 252   Basic Metabolic Panel:  Recent Labs  Lab 06/15/23 0514 06/16/23 0416 06/21/23 0743  NA 141 136 145  K 3.6 3.8 3.6  CL 106 104 110  CO2 25 24 19*  GLUCOSE 157* 192* 133*  BUN 16 14 9   CREATININE 1.49* 1.45* 1.57*  CALCIUM 8.6* 8.6* 8.5*  MG 2.0 1.8  --    Lipid Panel: No results for input(s): "CHOL", "TRIG", "HDL", "CHOLHDL", "VLDL", "LDLCALC" in the last 168 hours. HgbA1c: No results for input(s): "HGBA1C" in the last 168 hours. Urine Drug Screen:  Recent Labs  Lab 06/15/23 1345  LABOPIA NONE DETECTED  COCAINSCRNUR NONE DETECTED  LABBENZ NONE DETECTED  AMPHETMU NONE DETECTED  THCU POSITIVE*  LABBARB NONE DETECTED    Alcohol Level No results for input(s): "ETH" in the last 168 hours.  IMAGING past 24 hours DG Abd Portable 1V  Result Date: 06/21/2023 CLINICAL DATA:  Feeding tube placement. EXAM: PORTABLE ABDOMEN - 1 VIEW  COMPARISON:  07/31/2021 FINDINGS: Feeding tube has been placed, tip overlying the level of the distal stomach. There is contrast within the transverse colon. Visualized lungs are clear. UPPER abdomen is unremarkable. IMPRESSION: Feeding tube tip overlies the distal stomach. Electronically Signed   By: Zachary Winters M.D.   On: 06/21/2023 10:07    PHYSICAL EXAM General: Patient with global aphasia resting in bed with eyes open in no acute distress Respiratory: Regular, unlabored respirations on room air Neurologic: Patient with global aphasia does not respond to name or follow commands.  He blinks to threat on the left but not on the right and has a left gaze preference but can cross midline.  Right facial droop noted moves left upper and lower extremities spontaneously and will withdraw to right upper and lower extremities to noxious stimuli.  ASSESSMENT/PLAN [name] is a 42 y.o. male with history of diabetes, hypertension, hyperlipidemia and smoking who was initially admitted to [facility] on 6/19 with right hemiplegia and aphasia.  He presented outside the window for TNK and thrombectomy.  He was found to have a left basal ganglia infarct, which is now extended into much of the left MCA territory, and he also developed a small acute infarct in  posterior limb of right internal capsule.  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  Yesterday, he was transferred here to be seen by stroke team and possibly for conventional angiogram given severe stenosis.  Admission urine toxicology was positive for THC.  Patient's grandmother states that he does not often go to the doctor and does smoke cigarettes.  Stroke:  left MCA territory stroke due to R M1 occlusion, etiology likely large vessel disease Stroke: small infarct in R PLIC, likely synchronized small vessel disease CT head subacute infarct in left basal ganglia with probable petechial hemorrhage MRI 6/19 acute to subacute perforator  infarct left basal ganglia with petechial hemorrhage MRA severe stenosis versus segmental occlusion at the left M1 segment, left ICA smaller than right MRI 6/24 increased size of left MCA territory infarct in the left BG and overlying frontal lobe.  Similar petechial hemorrhage.  New interval small acute infarct in the right PLIC CTA head and neck pending Carotid Doppler no significant plaque formation 2D Echo EF 60 to 65%  LDL 265 HgbA1c 11.7 VTE prophylaxis -Lovenox No antithrombotic prior to admission, now on aspirin 81 mg daily and clopidogrel 75 mg daily for at least 3 months due to severe M1 stenosis. Therapy recommendations: CIR Disposition: Pending  Hypertension Home meds: None Stable Outside of permissive hypertension window BP goal 140-180 for 2 days and then gradually normalize BP in 2 to 3 days  Long-term BP goal normotensive  Hyperlipidemia Home meds:  Atorvastatin 10 mg daily  LDL 265, goal < 70 Lipitor increased to 80 Continue statin at discharge  Diabetes type II Uncontrolled Home meds: Lantus 30 units daily HgbA1c 11.7, goal < 7.0 CBGs SSI Close PCP follow-up for better DM control  Tobacco abuse Current smoker Smoking cessation counseling will be provided  AKI Creatinine 1.57-1.45-1.57 On IV fluid CVP monitoring  Dysphagia Did not pass a swallow Currently n.p.o. Core track placed On tube feeding Speech on board  Other Stroke Risk Factors Substance abuse - UDS:  THC POSITIVE. Patient will be advised to stop using due to stroke risk.  Other Active Problems None  Hospital day # 7  Zachary Winters , MSN, AGACNP-BC [facility] See Amion for schedule and pager information 06/21/2023 11:21 AM   ATTENDING NOTE: I reviewed above note and agree with the assessment and plan. Pt was seen and examined.   Grandma and speech therapist are at bedside.  Patient eyes open, however global aphasia, not following commands, nonverbal.   Right hemiplegia.  Right facial droop.  Left gaze preference, however able to cross midline with incomplete right gaze.  Not blinking to visual threat bilaterally.  Etiology for patient stroke and stroke extension likely due to large vessel occlusion.  Recommend BP goal 140-180 for 48 hours before gradually normalize BP.  Did not pass swallow, currently on tube feeding, put on DAPT and statin.  Pending CTA head and neck.  PT and OT recommend CIR.  For detailed assessment and plan, please refer to above/below as I have made changes wherever appropriate.   Marvel Plan, MD PhD Stroke Neurology 06/21/2023 6:23 PM   To contact Stroke Continuity provider, please refer to [email or web address]. After hours, contact General Neurology

## 2023-06-21 NOTE — Progress Notes (Signed)
Inpatient Rehabilitation Admissions Coordinator   I met at bedside with pt's grandmother at bedside. I await further medical workup before pursuing CIR admit.  Ottie Glazier, RN, MSN Rehab Admissions Coordinator [phone] 06/21/2023 2:05 PM

## 2023-06-21 NOTE — Progress Notes (Signed)
PROGRESS NOTE Zachary Winters  EXB:[id number] DOB: 16-Apr-1981 DOA: 06/14/2023 PCP: System, Provider Not In  Brief Narrative/Hospital Course: 42 year old gentleman with poorly controlled diabetes mellitus on insulin, chronic daily smoker, hypertension, history of marijuana use, diabetic peripheral neuropathy, hyperlipidemia was sent to the emergency department by [facility] with right-sided weakness right-sided facial droop and difficulty speaking.  He was last known to be well 2 days ago at 6 AM when his sister noticed he went out to smoke on ring camera.  He apparently lives with his sister.  Sister noticed the patient lying on the floor having a difficult time communicating only able to nod yes and no.  She noticed that for the last 2 days he has been lying around and sleeping. In ED: had significant aphasia and right-sided hemiparesis.  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  His blood glucose was 109.  Code Stroke was not activated due to being 48 hours since last known well time.  06/14/23:Admitted at [facility]> MRI brain:acute to subacute perforator infarct at the left basal ganglia and adjacent white matter with petechial hemorrhage. 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 Subsequently underwent TTE, TEE that was negative for hyper coagulable labs ordered seen by neuro PT OT speech, CT chest abdomen pelvis unremarkable and occult malignancy. 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 New/interval small acute infarct in the right posterior limb of the internal capsule> neurology was notified advised to transfer to [facility] for further neurology  management. 06/20/23 night patient arrived at [facility] 06/21/23;Cortrak ordered    Subjective: Patient seen and examined this morning Patient's mother is at the bedside, neurology talking with her. SLP working with the patient.  Assessment and Plan: Principal Problem:   Acute stroke due to ischemia [facility]) Active Problems:   Hypertension associated with diabetes (HCC)   Type 2 diabetes mellitus with diabetic neuropathy (HCC)   Hyperlipidemia associated with type 2 diabetes mellitus (HCC)   Dysphonia   Aphasia   Tobacco use disorder   Recreational drug use   Diabetic neuropathy (HCC)   Vitamin D deficiency   Apraxia   Left MCA territory stroke First is starting in the basal ganglia then extending as well as a small infarct in the posterior limb of right internal capsule Severe M1 left stenosis: Etiology likely severe left M1 M2 stenosis, large vessel disease.  Patient had extensive workup including CT head MRI, MRA carotid Dopplers 2D echo and TEE CAD above for results, HbA1c uncontrolled 1.7 LDL uncontrolled to 65. Once creatinine improves consider traditional angiogram for M1 left stenosis Continue aspirin 81, Plavix 75 x 3 months due to severe M1 stenosis continue PT OT speech.  Has global aphasia, core track placement for nutrition.  Remains to be seen home how much functional recovery he will make-discussed about risk of aspiration, possible need for long-term feeding if remains dysphagic.  Dysphagia: due to stroke core track placed for nutrition, speech following  Hypertension: Not on meds at home, outside of permissive hypertension-Allow BP on higher side due to stenosis.  Await any recommendations to initiate antihypertensive  Hyperlipidemia: not at goal continue atorvastatintatin 80 mg  Type 2 diabetes mellitus with uncontrolled hyperglycemia A1c 9.7, noncompliant,  with diabetic peripheral neuropathy: Blood sugar currently well-controlled, continue SSI, consider Lantus once  starting tube feeding  Recent Labs  Lab 06/20/23 1635 06/20/23 2018 06/21/23 0039 06/21/23 0609 06/21/23 1215  GLUCAP 109* 114* 127* 120* 142*   AKI vs CKD 3a Metabolic acidosis: Creatinine previously 1.0 in 2022 on admission 1.5, holding about the same, keep on gentle IV fluid hydration monitor bmp Recent Labs    06/14/23 0911 06/15/23 0514 06/16/23 0416 06/21/23 0743  BUN 19 16 14 9   CREATININE 1.57* 1.49* 1.45* 1.57*  CO2 27 25 24  19*    Anemia hemoglobin slowly downtrending monitor no obvious bleeding noted check anemia panel in the morning Recent Labs  Lab 06/16/23 0416 06/21/23 0743  HGB 11.1* 9.2*  HCT 34.0* 27.9*   Hypokalemia resolved   Severe vitamin D deficiency: continue weekly high-dose vitamin D replacement  Tobacco abuse: will need to stop smoking.  Recreational drug use UDS positive for marijuana:TOC consult for resources  Deconditioning debility: Continue aggressive PT OT speech eval. use restraint on the left side to protect core track tube if needed  Right upper lobe predominant tree-in-bud pattern suggesting chronic inflammation atypical infection such as MAC: Will refer him out to pulmonary clinic at discharge for further evaluation of this.  CT noted advanced coronary calcifications:Will refer him out to cardiology at discharge for surveillance.   DVT prophylaxis: enoxaparin (LOVENOX) injection 40 mg Start: 06/14/23 1045 Code Status:   Code Status: Full Code Family Communication: plan of care discussed with patient/mother at bedside. Patient status is:  inpatient because of acute stroke Level of care: Telemetry Medical   Dispo: The patient is from: Home            Anticipated disposition: CIR Objective: Vitals last 24 hrs: Vitals:   06/20/23 2332 06/21/23 0409 06/21/23 0721 06/21/23 1106  BP: (!) 157/89 (!) 163/94 (!) 181/87 (!) 172/87  Pulse: 100 (!) 109 (!) 103 92  Resp: (!) 21 19 18    Temp: 98.5 F (36.9 C) 98.2 F (36.8 C)  98.3 F (36.8 C) 98.4 F (36.9 C)  TempSrc: Oral Oral  Oral  SpO2: 100% 100% 100% 100%  Weight:      Height:       Weight change:   Physical Examination: General exam: alert awake, older than stated age HEENT:Oral mucosa moist, Ear/Nose WNL grossly Respiratory system: bilaterally clear BS, no use of accessory muscle Cardiovascular system: S1 & S2 +, No JVD. Gastrointestinal system: Abdomen soft,NT,ND, BS+ Nervous System:Has global aphasia resting in bed not in distress blinks on the left eye to threat but not on the right has right facial droop, moving left upper and lower extremities, withdraws on right upper and lower extremities to noxious stimuli Extremities: LE edema neg,distal peripheral pulses palpable.  Skin: No rashes,no icterus. MSK: Normal muscle bulk,tone, power  Medications reviewed:  Scheduled Meds:  aspirin  81 mg Per Tube Daily   atorvastatin  80 mg Oral QPM   clopidogrel  75 mg Oral Daily   enoxaparin (LOVENOX) injection  40 mg Subcutaneous Daily   insulin aspart  0-9 Units Subcutaneous Q6H   [START ON 06/22/2023] Vitamin D (Ergocalciferol)  50,000 Units Oral Q7 days   Continuous Infusions:  sodium chloride 125 mL/hr at 06/21/23 1241   feeding supplement (GLUCERNA 1.5 CAL) 1,000 mL (06/21/23 1106)      Diet Order             Diet NPO time specified  Diet effective now  Nutrition Problem: Inadequate oral intake Etiology: dysphagia Signs/Symptoms: NPO status Interventions: Refer to RD note for recommendations   Intake/Output Summary (Last 24 hours) at 06/21/2023 1256 Last data filed at 06/21/2023 0543 Gross per 24 hour  Intake 2561.36 ml  Output 900 ml  Net 1661.36 ml   Net IO Since Admission: -2,319.75 mL [06/21/23 1256]  Wt Readings from Last 3 Encounters:  06/16/23 68 kg  09/05/21 68 kg  07/31/21 65.3 kg     Unresulted Labs (From admission, onward)     Start     Ordered   06/21/23 1700  Magnesium  (ICU Tube  Feeding: PEPuP )  5A & 5P,   R (with TIMED occurrences)     Question:  Specimen collection method  Answer:  Lab=Lab collect   06/21/23 1043   06/21/23 1700  Phosphorus  (ICU Tube Feeding: PEPuP )  5A & 5P,   R (with TIMED occurrences)     Question:  Specimen collection method  Answer:  Lab=Lab collect   06/21/23 1043   06/21/23 0500  Creatinine, serum  (enoxaparin (LOVENOX)    CrCl >/= 30 ml/min)  Weekly,   R     Comments: while on enoxaparin therapy    06/14/23 1038   06/17/23 0500  Factor 5 leiden  (Hypercoagulable Panel, Comprehensive (PNL))  Tomorrow morning,   R        06/16/23 1253   06/17/23 0500  Prothrombin gene mutation  (Hypercoagulable Panel, Comprehensive (PNL))  Tomorrow morning,   R        06/16/23 1253          Data Reviewed: I have personally reviewed following labs and imaging studies CBC: Recent Labs  Lab 06/16/23 0416 06/21/23 0743  WBC 7.4 9.8  HGB 11.1* 9.2*  HCT 34.0* 27.9*  MCV 91.4 88.9  PLT 237 252   Basic Metabolic Panel: Recent Labs  Lab 06/15/23 0514 06/16/23 0416 06/21/23 0743  NA 141 136 145  K 3.6 3.8 3.6  CL 106 104 110  CO2 25 24 19*  GLUCOSE 157* 192* 133*  BUN 16 14 9   CREATININE 1.49* 1.45* 1.57*  CALCIUM 8.6* 8.6* 8.5*  MG 2.0 1.8  --    Recent Labs  Lab 06/20/23 1635 06/20/23 2018 06/21/23 0039 06/21/23 0609 06/21/23 1215  GLUCAP 109* 114* 127* 120* 142*  No results found for this or any previous visit (from the past 240 hour(s)).  Antimicrobials: Anti-infectives (From admission, onward)    None      Culture/Microbiology    Component Value Date/Time   SDES  03/14/2021 1728    IN/OUT CATH URINE Performed at [facility], [address], [place], [place] [address]    [facility]  03/14/2021 1728    NONE Performed at [facility], [address], [place], [place] [address]    CULT MULTIPLE SPECIES PRESENT, SUGGEST RECOLLECTION (A) 03/14/2021 1728   REPTSTATUS 03/16/2021 FINAL 03/14/2021 1728     Radiology Studies: DG Abd Portable 1V  Result Date: 06/21/2023 CLINICAL DATA:  Feeding tube placement. EXAM: PORTABLE ABDOMEN - 1 VIEW COMPARISON:  07/31/2021 FINDINGS: Feeding tube has been placed, tip overlying the level of the distal stomach. There is contrast within the transverse colon. Visualized lungs are clear. UPPER abdomen is unremarkable. IMPRESSION: Feeding tube tip overlies the distal stomach. Electronically Signed   By: Norva Pavlov M.D.   On: 06/21/2023 10:07   DG Swallowing Func-Speech Pathology  Result Date: 06/19/2023 Table formatting from  the original result was not included. Modified Barium Swallow Study Patient Details Name: Zachary Winters MRN: [id number] Date of Birth: 02-20-1981 Today's Date: 06/19/2023 HPI/PMH: HPI: SHANDY VI is a 42 year old gentleman with poorly controlled diabetes mellitus on insulin, chronic daily smoker, hypertension, history of marijuana use, diabetic peripheral neuropathy, hyperlipidemia was sent to the emergency department by [facility] with right-sided weakness right-sided facial droop and difficulty speaking.  He was last known to be well 2 days ago at 6 AM when his sister noticed he went out to smoke on [place] camera.  He apparently lives with his sister.  Sister noticed the patient lying on the floor having a difficult time communicating only able to nod yes and no.  She noticed that for the last 2 days he has been lying around and sleeping. He was seen in the ED and noted to have a significant aphasia and right-sided hemiparesis.  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 Repeat MRI 06/19/23 shows extension of stroke and other involvement (see below). MBSS requested due to change in swallow function over the weekend. Clinical Impression: Clinical Impression: Pt presents with mod/severe oral phase dysphagia and mild/mod pharyngeal phase  dysphagia characterized by weak lingual movement and bolus manipulation with labial spillage, oral holding, and spillover to the pharynx. Pt with variable swallow trigger, occasionally at the pyriforms. Pt with min decreased tongue base retraction, epiglottic deflection (improved with heavier/larger bolus), and hyolaryngeal excursion resulting in one episode of silent aspiration of nectar thick liquids and consistent penetration with thins/NTL. Pt accepted bolus better when he self presented to this was implemented several times. Pt required cues to take only one sip (and liquids spilling out of oral cavity). Pt's significant cognitive communication deficits negatively impact safe and efficient PO consumption at this time. He is unlikely to meet nutritional needs in his current state, but could begin a D1/puree and HTL 1:1 in a supervised setting and oral suction available if he is transferred to inpatient rehab. For now, recommend setting up oral suction at bedside, offering single ice chips after oral care, and can present medications crushed as able in puree. Consider placement of coretrack at [facility] if alertness is an issue. Pt is at greatest risk of aspirating oral residuals that he is unable to clear (he has a difficult time initiating additional dry swallows after initial presentation- can dry dry spoon, cold stim etc). If alertness and oral phase improves, prognosis for efficient PO intake is good. Above discussed via secure chat with MD and RN. Factors that may increase risk of adverse event in presence of aspiration [name] & [name] 2021): Factors that may increase risk of adverse event in presence of aspiration [name] & [name] 2021): Reduced cognitive function; Limited mobility; Dependence for feeding and/or oral hygiene; Inadequate oral hygiene; Weak cough Recommendations/Plan: Swallowing Evaluation Recommendations Swallowing Evaluation Recommendations Recommendations: Ice chips PRN after oral  care; NPO except meds (consider coretrack at [facility] and 1:1 PO trials of puree and HTL with SLP) Liquid Administration via: Spoon; Cup Medication Administration: Crushed with puree Supervision: Full supervision/cueing for swallowing strategies; Staff to assist with self-feeding Swallowing strategies  : Slow rate; Small bites/sips; Check for pocketing or oral holding; Check for anterior loss; Multiple dry swallows after each bite/sip (have oral suction/oral care after meals) Postural changes: Position pt fully upright for meals; Stay upright 30-60 min after meals Oral care recommendations:  Staff/trained caregiver to provide oral care; Oral care before ice chips/water Recommended consults: Consider dietitian consultation Caregiver Recommendations: Have oral suction available Treatment Plan Treatment Plan Treatment recommendations: Therapy as outlined in treatment plan below Follow-up recommendations: Acute inpatient rehab (3 hours/day) Functional status assessment: Patient has had a recent decline in their functional status and demonstrates the ability to make significant improvements in function in a reasonable and predictable amount of time. Treatment frequency: Min 2x/week Treatment duration: 1 week Interventions: Aspiration precaution training; Oropharyngeal exercises; Patient/family education; Trials of upgraded texture/liquids Recommendations Recommendations for follow up therapy are one component of a multi-disciplinary discharge planning process, led by the attending physician.  Recommendations may be updated based on patient status, additional functional criteria and insurance authorization. Assessment: Orofacial Exam: Orofacial Exam Oral Cavity: Oral Hygiene: Lingual coating (halitosis) Oral Cavity - Dentition: Adequate natural dentition Orofacial Anatomy: WFL Oral Motor/Sensory Function: Suspected cranial nerve impairment CN V - Trigeminal: Not tested CN VII - Facial: Right motor impairment CN IX -  Glossopharyngeal, CN X - Vagus: Right motor impairment Anatomy: Anatomy: Suspected cervical osteophytes Boluses Administered: Boluses Administered Boluses Administered: Thin liquids (Level 0); Mildly thick liquids (Level 2, nectar thick); Moderately thick liquids (Level 3, honey thick); Puree  Oral Impairment Domain: Oral Impairment Domain Lip Closure: Escape beyond mid-chin Tongue control during bolus hold: Posterior escape of greater than half of bolus Bolus transport/lingual motion: Minimal-no tongue motion Oral residue: Majority of bolus remaining Location of oral residue : Floor of mouth; Tongue Initiation of pharyngeal swallow : Posterior laryngeal surface of the epiglottis  Pharyngeal Impairment Domain: Pharyngeal Impairment Domain Soft palate elevation: Trace column of contrast or air between SP and PW Laryngeal elevation: Partial superior movement of thyroid cartilage/partial approximation of arytenoids to epiglottic petiole Anterior hyoid excursion: Partial anterior movement Epiglottic movement: Partial inversion Laryngeal vestibule closure: Incomplete, narrow column air/contrast in laryngeal vestibule Pharyngeal stripping wave : Present - diminished Pharyngeal contraction (A/P view only): N/A Pharyngoesophageal segment opening: Partial distention/partial duration, partial obstruction of flow Tongue base retraction: Trace column of contrast or air between tongue base and PPW Pharyngeal residue: Collection of residue within or on pharyngeal structures Location of pharyngeal residue: Valleculae; Pyriform sinuses  Esophageal Impairment Domain: Esophageal Impairment Domain Esophageal clearance upright position: Complete clearance, esophageal coating Pill: Pill Consistency administered: -- (not presented) Penetration/Aspiration Scale Score: Penetration/Aspiration Scale Score 1.  Material does not enter airway: Puree 2.  Material enters airway, remains ABOVE vocal cords then ejected out: Moderately thick liquids  (Level 3, honey thick) 4.  Material enters airway, CONTACTS cords then ejected out: Thin liquids (Level 0) 7.  Material enters airway, passes BELOW cords and not ejected out despite cough attempt by patient: Mildly thick liquids (Level 2, nectar thick) Compensatory Strategies: Compensatory Strategies Compensatory strategies: No   General Information: Caregiver present: No  Diet Prior to this Study: NPO   Temperature : Normal   Respiratory Status: WFL   Supplemental O2: None (Room air)   History of Recent Intubation: No  Behavior/Cognition: Alert; Pleasant mood; Requires cueing; Impulsive Self-Feeding Abilities: Needs assist with self-feeding Baseline vocal quality/speech: Not observed Volitional Cough: Unable to elicit Volitional Swallow: Unable to elicit Exam Limitations: Excessive movement (impusivity) Goal Planning: Prognosis for improved oropharyngeal function: Fair Barriers to Reach Goals: Cognitive deficits; Language deficits; Severity of deficits No data recorded No data recorded Consulted and agree with results and recommendations: Pt unable/family or caregiver not available; Nurse; Physician Pain: Pain Assessment Pain Assessment: No/denies pain End of Session:  Start Time:SLP Start Time (ACUTE ONLY): 1430 Stop Time: SLP Stop Time (ACUTE ONLY): 1503 Time Calculation:SLP Time Calculation (min) (ACUTE ONLY): 33 min Charges: SLP Evaluations $ SLP Speech Visit: 1 Visit SLP Evaluations $MBS Swallow: 1 Procedure $Swallowing Treatment: 1 Procedure SLP visit diagnosis: SLP Visit Diagnosis: Dysphagia, oropharyngeal phase (R13.12) Past Medical History: Past Medical History: Diagnosis Date  Diabetes mellitus without complication (HCC)  Past Surgical History: No past surgical history on file. Thank you, [name], CCC-SLP [phone] PORTER,DABNEY 06/19/2023, 4:14 PM    LOS: 7 days   Lanae Boast, MD [facility]  06/21/2023, 12:56 PM

## 2023-06-21 NOTE — Inpatient Diabetes Management (Signed)
Inpatient Diabetes Program Recommendations  AACE/ADA: New Consensus Statement on Inpatient Glycemic Control (2015)  Target Ranges:  Prepandial:   less than 140 mg/dL      Peak postprandial:   less than 180 mg/dL (1-2 hours)      Critically ill patients:  140 - 180 mg/dL   Lab Results  Component Value Date   GLUCAP 142 (H) 06/21/2023   HGBA1C 11.7 (H) 06/14/2023    Review of Glycemic Control  Latest Reference Range & Units 06/20/23 10:08 06/20/23 13:28 06/20/23 15:54 06/20/23 16:35 06/20/23 20:18 06/21/23 00:39 06/21/23 06:09 06/21/23 12:15  Glucose-Capillary 70 - 99 mg/dL 161 (H) 096 (H) 045 (H) 109 (H) 114 (H) 127 (H) 120 (H) 142 (H)   Diabetes history: DM 2 Outpatient Diabetes medications:  Lantus 30 units daily Current orders for Inpatient glycemic control:  Novolog 0-9 units q 6 hours  Inpatient Diabetes Program Recommendations:    Note referral.  DM coordinator spoke to patient's father by phone when he was at [facility] campus (on 6/20) regarding medications, A1C, etc.  Of note blood sugars are currently well controlled with only correction.  Father stated that he and other family are knowledgeable regarding injections and how to administer insulin if needed at home.  Will continue to monitor while in the hospital.   Thanks,   Beryl Meager, RN, BC-ADM Inpatient Diabetes Coordinator Pager [phone]  (8a-5p)

## 2023-06-21 NOTE — Procedures (Signed)
Cortrak  Person Inserting Tube:  Lisa-Marie Rueger T, RD Tube Type:  Cortrak - 43 inches Tube Size:  10 Tube Location:  Left nare Secured by: Bridle Technique Used to Measure Tube Placement:  Marking at nare/corner of mouth Cortrak Secured At:  64 cm   Cortrak Tube Team Note:  Consult received to place a Cortrak feeding tube.   X-ray is required, abdominal x-ray has been ordered by the Cortrak team. Please confirm tube placement before using the Cortrak tube.   If the tube becomes dislodged please keep the tube and contact the Cortrak team at [email or web address] for replacement.  If after hours and replacement cannot be delayed, place a NG tube and confirm placement with an abdominal x-ray.    Blayton Huttner RD, LDN For contact information, refer to AMiON.   

## 2023-06-22 DIAGNOSIS — I639 Cerebral infarction, unspecified: Secondary | ICD-10-CM | POA: Diagnosis not present

## 2023-06-22 LAB — BASIC METABOLIC PANEL
Anion gap: 8 (ref 5–15)
BUN: 7 mg/dL (ref 6–20)
CO2: 23 mmol/L (ref 22–32)
Calcium: 8.6 mg/dL — ABNORMAL LOW (ref 8.9–10.3)
Chloride: 112 mmol/L — ABNORMAL HIGH (ref 98–111)
Creatinine, Ser: 1.27 mg/dL — ABNORMAL HIGH (ref 0.61–1.24)
GFR, Estimated: >60 mL/min (ref >60)
Glucose, Bld: 136 mg/dL — ABNORMAL HIGH (ref 70–99)
Potassium: 3.6 mmol/L (ref 3.5–5.1)
Sodium: 143 mmol/L (ref 135–145)

## 2023-06-22 LAB — MAGNESIUM
Magnesium: 1.8 mg/dL (ref 1.7–2.4)
Magnesium: 1.8 mg/dL (ref 1.7–2.4)

## 2023-06-22 LAB — GLUCOSE, CAPILLARY
Glucose-Capillary: 128 mg/dL — ABNORMAL HIGH (ref 70–99)
Glucose-Capillary: 131 mg/dL — ABNORMAL HIGH (ref 70–99)
Glucose-Capillary: 148 mg/dL — ABNORMAL HIGH (ref 70–99)
Glucose-Capillary: 182 mg/dL — ABNORMAL HIGH (ref 70–99)
Glucose-Capillary: 200 mg/dL — ABNORMAL HIGH (ref 70–99)

## 2023-06-22 LAB — PHOSPHORUS
Phosphorus: 2.7 mg/dL (ref 2.5–4.6)
Phosphorus: 2.9 mg/dL (ref 2.5–4.6)

## 2023-06-22 MED ORDER — ATORVASTATIN CALCIUM 80 MG PO TABS
80.0000 mg | ORAL_TABLET | Freq: Every evening | ORAL | Status: DC
  Administered 2023-06-22 – 2023-06-27 (×6): 80 mg
  Filled 2023-06-22 (×6): qty 1

## 2023-06-22 MED ORDER — NICOTINE 21 MG/24HR TD PT24
21.0000 mg | MEDICATED_PATCH | Freq: Every day | TRANSDERMAL | Status: DC
  Administered 2023-06-22 – 2023-06-27 (×6): 21 mg via TRANSDERMAL
  Filled 2023-06-22 (×6): qty 1

## 2023-06-22 MED ORDER — LABETALOL HCL 5 MG/ML IV SOLN
5.0000 mg | INTRAVENOUS | Status: DC | PRN
  Administered 2023-06-22 (×2): 5 mg via INTRAVENOUS
  Filled 2023-06-22 (×2): qty 4

## 2023-06-22 MED ORDER — AMLODIPINE BESYLATE 5 MG PO TABS
5.0000 mg | ORAL_TABLET | Freq: Every day | ORAL | Status: DC
  Administered 2023-06-22: 5 mg via NASOGASTRIC
  Filled 2023-06-22: qty 1

## 2023-06-22 MED ORDER — CHOLECALCIFEROL 10 MCG/ML (400 UNIT/ML) PO LIQD
400.0000 [IU] | Freq: Every day | ORAL | Status: DC
  Administered 2023-06-22: 400 [IU]
  Filled 2023-06-22 (×2): qty 1

## 2023-06-22 NOTE — Progress Notes (Signed)
PROGRESS NOTE Zachary Winters  QMV:[id number] DOB: May 01, 1981 DOA: 06/14/2023 PCP: System, Provider Not In  Brief Narrative/Hospital Course: 42 year old gentleman with poorly controlled diabetes mellitus on insulin, chronic daily smoker, hypertension, history of marijuana use, diabetic peripheral neuropathy, hyperlipidemia was sent to the emergency department by [facility] with right-sided weakness right-sided facial droop and difficulty speaking.  He was last known to be well 2 days ago at 6 AM when his sister noticed he went out to smoke on ring camera.  He apparently lives with his sister.  Sister noticed the patient lying on the floor having a difficult time communicating only able to nod yes and no.  She noticed that for the last 2 days he has been lying around and sleeping. In ED: had significant aphasia and right-sided hemiparesis.  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  His blood glucose was 109.  Code Stroke was not activated due to being 48 hours since last known well time.  06/14/23:Admitted at [facility]> MRI brain:acute to subacute perforator infarct at the left basal ganglia and adjacent white matter with petechial hemorrhage. 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 Subsequently underwent TTE, TEE that was negative for hyper coagulable labs ordered seen by neuro PT OT speech, CT chest abdomen pelvis unremarkable and occult malignancy. 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 New/interval small acute infarct in the right posterior limb of the internal capsule> neurology was notified advised to transfer to [facility] for further neurology  management. 06/20/23 night patient arrived at [facility] 06/21/23;Cortrak ordered  Subjective: Patient seen and examined this morning His mother is at the bedside Left arm remains on restraint core track tube in place Able to follow commands and moving his left leg but remains aphasic. Overnight afebrile blood pressure remains high 160s to 190s systolic, not hypoxic Labs shows creatinine downtrending at 1.2 blood sugar 136 Remains on IV fluid hydration, Glucerna tube feeding started 6/26  Assessment and Plan: Principal Problem:   Acute stroke due to ischemia [facility]) Active Problems:   Hypertension associated with diabetes (HCC)   Type 2 diabetes mellitus with diabetic neuropathy (HCC)   Hyperlipidemia associated with type 2 diabetes mellitus (HCC)   Dysphonia   Aphasia   Tobacco use disorder   Recreational drug use   Diabetic neuropathy (HCC)   Vitamin D deficiency   Apraxia  Left MCA territory stroke First is starting in the basal ganglia then extending as well as a small infarct in the posterior limb of right internal capsule Severe M1 left stenosis: Etiology likely severe left M1 M2 stenosis, large vessel disease.  Patient had extensive workup including CT head MRI, MRA carotid Dopplers 2D echo and TEE CAD above for results, HbA1c uncontrolled 1.7 LDL uncontrolled 65. Continue aspirin 81, Plavix 75 x 3 months due to severe M1 stenosis continue PT OT speech.  Has global aphasia, core track placed for nutrition.Remains to be seen home how much functional recovery he will make-discussed about risk of aspiration, possible need for long-term feeding if remains dysphagic.  Continue oral suctioning as he has been having cough.  Severe M1 left stenosis: Discussed with neurology, given petechial hemorrhage unable to do it stent at this time  possibly in near future will defer to neurology  Dysphagia: due to stroke core track placed for nutrition, speech following  Hypertension: Not on meds at  home, outside of permissive hypertension.BP remains poorly controlled up to 190s, added amlodipine 5 mg, monitor and adjust meds   Hyperlipidemia: not at goal continue atorvastatintatin 80 mg  Type 2 diabetes mellitus with uncontrolled hyperglycemia A1c 9.7, noncompliant, with diabetic peripheral neuropathy: Blood sugar currently well-controlled, continue SSI, consider Lantus once blood sugar starts to trend up with tube feeding  at goal Recent Labs  Lab 06/21/23 1632 06/21/23 2052 06/22/23 0022 06/22/23 0418 06/22/23 0833  GLUCAP 117* 118* 131* 128* 148*   AKI vs CKD 3a Metabolic acidosis: Creatinine previously 1.0 in 2022 on admission 1.5, creatinine improved to 1.2 with ongoing IV fluid hydration, metabolic acidosis improved.  Cont same Recent Labs    06/14/23 0911 06/15/23 0514 06/16/23 0416 06/21/23 0743 06/22/23 0713  BUN 19 16 14 9 7   CREATININE 1.57* 1.49* 1.45* 1.57* 1.27*  CO2 27 25 24  19* 23    Anemia hemoglobin slowly downtrending monitor no obvious bleeding noted, likely dilutional Recent Labs  Lab 06/16/23 0416 06/21/23 0743  HGB 11.1* 9.2*  HCT 34.0* 27.9*   Hypokalemia resolved   Severe vitamin D deficiency: continue weekly high-dose vitamin D replacement  Tobacco abuse: will need to stop smoking.  Continue scheduled nicotine patch  Recreational drug use UDS positive for marijuana: TOC consult for resources  Deconditioning debility Continue aggressive PT OT speech eval. use restraint on the left side to protect core track tube.  Right upper lobe predominant tree-in-bud pattern suggesting chronic inflammation atypical infection such as MAC: Will refer him out to pulmonary clinic at discharge for further evaluation of this.  At risk of aspiration continue suctioning  CT noted advanced coronary calcifications:Will refer him out to cardiology at discharge for surveillance.   DVT prophylaxis: enoxaparin (LOVENOX) injection 40 mg Start: 06/14/23  1045 Code Status:   Code Status: Full Code Family Communication: plan of care discussed with patient/mother at bedside. Patient status is:  inpatient because of acute stroke Level of care: Telemetry Medical   Dispo: The patient is from: Home            Anticipated disposition: CIR Objective: Vitals last 24 hrs: Vitals:   06/22/23 0418 06/22/23 0500 06/22/23 0747 06/22/23 0800  BP: (!) 176/91  (!) 196/91 (!) 165/80  Pulse: 91  81   Resp: 18     Temp: 98.1 F (36.7 C)  98.6 F (37 C)   TempSrc: Oral  Oral   SpO2: 100%  97%   Weight:  86.1 kg    Height:       Weight change:   General exam: alert awake, eyes closed, aphasic, follows commands and able to move left side HEENT:Oral mucosa moist, Ear/Nose WNL grossly.cortrak+ Respiratory system: Bilaterally clear BS,no use of accessory muscle Cardiovascular system: S1 & S2 +, No JVD. Gastrointestinal system: Abdomen soft,NT,ND, BS+ Nervous System: Alert, awake, moving left side, not able ot move rt- sometime withdraws to noxious stimuli on right  Extremities: LE edema neg,distal peripheral pulses palpable and warm.  Skin: No rashes,no icterus. MSK: Normal muscle bulk,tone, power  Medications reviewed:  Scheduled Meds:  amLODipine  5 mg Per NG tube Daily   aspirin  81 mg Per Tube Daily   atorvastatin  80 mg Oral QPM   clopidogrel  75 mg Per NG tube Daily   enoxaparin (LOVENOX) injection  40  mg Subcutaneous Daily   insulin aspart  0-9 Units Subcutaneous Q6H   nicotine  21 mg Transdermal Daily   Vitamin D (Ergocalciferol)  50,000 Units Oral Q7 days  Continuous Infusions:  sodium chloride 125 mL/hr at 06/22/23 0535   feeding supplement (GLUCERNA 1.5 CAL) 50 mL/hr at 06/22/23 0600    Diet Order             Diet NPO time specified  Diet effective now                 Nutrition Problem: Inadequate oral intake Etiology: dysphagia Signs/Symptoms: NPO status Interventions: Refer to RD note for  recommendations   Intake/Output Summary (Last 24 hours) at 06/22/2023 1126 Last data filed at 06/22/2023 0418 Gross per 24 hour  Intake 1980.33 ml  Output 1500 ml  Net 480.33 ml   Net IO Since Admission: -1,839.42 mL [06/22/23 1126]  Wt Readings from Last 3 Encounters:  06/22/23 86.1 kg  09/05/21 68 kg  07/31/21 65.3 kg     Unresulted Labs (From admission, onward)     Start     Ordered   06/22/23 0500  Basic metabolic panel  Daily,   R     Question:  Specimen collection method  Answer:  Lab=Lab collect   06/21/23 1310   06/21/23 1700  Magnesium  (ICU Tube Feeding: PEPuP )  5A & 5P,   R (with TIMED occurrences)     Question:  Specimen collection method  Answer:  Lab=Lab collect   06/21/23 1043   06/21/23 1700  Phosphorus  (ICU Tube Feeding: PEPuP )  5A & 5P,   R (with TIMED occurrences)     Question:  Specimen collection method  Answer:  Lab=Lab collect   06/21/23 1043   06/21/23 0500  Creatinine, serum  (enoxaparin (LOVENOX)    CrCl >/= 30 ml/min)  Weekly,   R     Comments: while on enoxaparin therapy    06/14/23 1038   06/17/23 0500  Factor 5 leiden  (Hypercoagulable Panel, Comprehensive (PNL))  Tomorrow morning,   R        06/16/23 1253   06/17/23 0500  Prothrombin gene mutation  (Hypercoagulable Panel, Comprehensive (PNL))  Tomorrow morning,   R        06/16/23 1253          Data Reviewed: I have personally reviewed following labs and imaging studies CBC: Recent Labs  Lab 06/16/23 0416 06/21/23 0743  WBC 7.4 9.8  HGB 11.1* 9.2*  HCT 34.0* 27.9*  MCV 91.4 88.9  PLT 237 252   Basic Metabolic Panel: Recent Labs  Lab 06/16/23 0416 06/21/23 0743 06/21/23 1721 06/22/23 0713  NA 136 145  --  143  K 3.8 3.6  --  3.6  CL 104 110  --  112*  CO2 24 19*  --  23  GLUCOSE 192* 133*  --  136*  BUN 14 9  --  7  CREATININE 1.45* 1.57*  --  1.27*  CALCIUM 8.6* 8.5*  --  8.6*  MG 1.8  --  2.0 1.8  PHOS  --   --  2.8 2.7   Recent Labs  Lab 06/21/23 1632  06/21/23 2052 06/22/23 0022 06/22/23 0418 06/22/23 0833  GLUCAP 117* 118* 131* 128* 148*  No results found for this or any previous visit (from the past 240 hour(s)).  Antimicrobials: Anti-infectives (From admission, onward)    None      Culture/Microbiology  Component Value Date/Time   SDES  03/14/2021 1728    IN/OUT CATH URINE Performed at [facility], [address] [place], [place] [address]    [facility]  03/14/2021 1728    NONE Performed at [facility], [address] [place], [place] [address]    CULT MULTIPLE SPECIES PRESENT, SUGGEST RECOLLECTION (A) 03/14/2021 1728   REPTSTATUS 03/16/2021 FINAL 03/14/2021 1728    Radiology Studies: CT ANGIO HEAD NECK W WO CM  Result Date: 06/21/2023 CLINICAL DATA:  Stroke, determine embolic source EXAM: CT ANGIOGRAPHY HEAD AND NECK WITH AND WITHOUT CONTRAST TECHNIQUE: Multidetector CT imaging of the head and neck was performed using the standard protocol during bolus administration of intravenous contrast. Multiplanar CT image reconstructions and MIPs were obtained to evaluate the vascular anatomy. Carotid stenosis measurements (when applicable) are obtained utilizing NASCET criteria, using the distal internal carotid diameter as the denominator. RADIATION DOSE REDUCTION: This exam was performed according to the departmental dose-optimization program which includes automated exposure control, adjustment of the mA and/or kV according to patient size and/or use of iterative reconstruction technique. CONTRAST:  75mL OMNIPAQUE IOHEXOL 350 MG/ML SOLN COMPARISON:  06/14/2023 CT head, MRI head, and MRA head; 06/19/2023 MRI head FINDINGS: CT HEAD FINDINGS Brain: Hypodensity throughout the left basal ganglia, extending into the lateral thalamus, as well as in the left frontal and temporal lobes, which is more extensive than on the 06/14/2023 CT but correlates with the area of infarction on the 06/19/2023 MRI. Some increased density in the  left basal ganglia correlates with petechial hemorrhage in this area. Mass effect on the left lateral ventricle, with 3 mm of right to left midline shift. A small amount of hypodensity in the right posterior limb the internal capsule correlates with an additional acute infarct in this area. No additional area of infarct. No other parenchymal hemorrhage. No mass, hydrocephalus, or extra-axial collection. Vascular: No hyperdense vessel. Skull: Negative for fracture or focal lesion. Sinuses/Orbits: Mucosal thickening in the ethmoid air cells and frontal sinuses. No acute finding in the orbits. Other: Nasogastric tube noted. CTA NECK FINDINGS Aortic arch: Two-vessel arch with a common origin of the brachiocephalic and left common carotid arteries. Imaged portion shows no evidence of aneurysm or dissection. No significant stenosis of the major arch vessel origins. Right carotid system: No evidence of stenosis, dissection, or occlusion. Left carotid system: No evidence of stenosis, dissection, or occlusion. Vertebral arteries: Multifocal irregularity and mild-to-moderate stenosis in the left V1 and V2 segments (series 16, images 217, 255, 283, 300). Multifocal irregularity and mild stenosis in the right V1 and proximal V2 segment (series 16, image 256 and 268). The more distal V2 and V3 segments are patent bilaterally. No evidence of dissection. Skeleton: No acute osseous abnormality. Degenerative changes in the cervical spine. Other neck: No acute finding. Upper chest: Heterogeneous opacities in the dependent right upper lobe (series 16, image 44, for example), which appears increased compared to the 06/15/2023 CT chest. No pleural effusion. Review of the MIP images confirms the above findings CTA HEAD FINDINGS Anterior circulation: Both internal carotid arteries are patent to the termini, with mild stenosis in the left cavernous segment, moderate stenosis in the right supraclinoid segment, and severe stenosis in the  left supraclinoid segment. Poor opacification of the left ICA terminus. A1 segments patent, hypoplastic on the left. Normal anterior communicating artery. Anterior cerebral arteries are patent to their distal aspects without significant stenosis. Severe, near occlusive stenosis in the left M1 (series 16, image  [phone]), with poor perfusion of the proximal M2 branches. Slightly better perfusion is seen in the posterior left MCA branches, some of which may be due to collaterals. The right M1 and MCA branches are perfused to their distal aspects without significant stenosis. Posterior circulation: Severe stenosis in the left V4 (series 16, images 453-470), proximal to the PICA takeoff. The distal left V4 is patent without significant stenosis. The right V4 is patent to the vertebrobasilar junction without significant stenosis. Posteroinferior cerebellar arteries are patent bilaterally. Basilar patent to its distal aspect without significant stenosis. Superior cerebellar arteries patent proximally. Patent P1 segments. PCAs perfused to their distal aspects without significant stenosis. The bilateral posterior communicating arteries are not visualized. Venous sinuses: As permitted by contrast timing, patent. Anatomic variants: None significant. Review of the MIP images confirms the above findings IMPRESSION: 1. Evolving infarcts in the left basal ganglia, left frontal and temporal lobes, and right posterior limb of the internal capsule, with petechial hemorrhage in the left basal ganglia, as seen on the 06/19/2023 MRI. Mass effect on the left lateral ventricle with 3 mm of left-to-right midline shift. 2. Severe, near occlusive stenosis in the left M1, with poor perfusion of the proximal M2 branches, and slightly better perfusion in the posterior left MCA branches, some of which may be due to collaterals. 3. Severe stenosis in the left V4, proximal to the PICA takeoff. 4. Severe stenosis in the left supraclinoid ICA,  moderate stenosis in the right supraclinoid ICA, and mild stenosis in the left cavernous ICA. 5. Multifocal irregularity and mild-to-moderate stenosis in the left V1 and V2 segments, and mild stenosis in the right V1 and proximal V2 segments. 6. No other hemodynamically significant stenosis in the neck. 7. Heterogeneous opacities in the dependent right upper lobe, which appears increased compared to the 06/15/2023 CT chest and could represent aspiration or infection. Electronically Signed   By: Wiliam Ke M.D.   On: 06/21/2023 18:51   DG Abd Portable 1V  Result Date: 06/21/2023 CLINICAL DATA:  Feeding tube placement. EXAM: PORTABLE ABDOMEN - 1 VIEW COMPARISON:  07/31/2021 FINDINGS: Feeding tube has been placed, tip overlying the level of the distal stomach. There is contrast within the transverse colon. Visualized lungs are clear. UPPER abdomen is unremarkable. IMPRESSION: Feeding tube tip overlies the distal stomach. Electronically Signed   By: Norva Pavlov M.D.   On: 06/21/2023 10:07     LOS: 8 days   Lanae Boast, MD [facility]  06/22/2023, 11:26 AM

## 2023-06-22 NOTE — Progress Notes (Addendum)
Inpatient Rehabilitation Admissions Coordinator   Noted plans for stent next Tuesday., I will re Auth with [place] Medicaid heathy Blue postoperatively for CIR admit as initial approval received 6/21.  Ottie Glazier, RN, MSN Rehab Admissions Coordinator [phone] 06/22/2023 4:36 PM

## 2023-06-22 NOTE — Progress Notes (Signed)
Physical Therapy Treatment Patient Details Name: Zachary Winters MRN: [id number] DOB: [birth year] Today's Date: 06/22/2023   History of Present Illness [name] is a 42 year old gentleman who presents 6/19 to the [facility] emergency department by [facility] with right-sided weakness right-sided facial droop and difficulty speaking. 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 6/23 pt developed acute worsening of swallow function; MRI showing interval increase in size of acute L MCA infarction.Transported to [facility] 6/25. History of poorly controlled diabetes mellitus on insulin, chronic daily smoker, hypertension, history of marijuana use, diabetic peripheral neuropathy, and hyperlipidemia.    PT Comments    Pt was non-verbal but able to nod or shake his head yes/no to questions, less consistently as he fatigued. He also would often shake his head no first then quickly correct it to a nod yes. Pt tends to lean to the R, impacting his balance and safety with all functional mobility. Thus, focused session on midline orientation in sitting and standing while utilizing a mirror for visual feedback. He still needed extensive cues but did appear to benefit from the visual cues to facilitate midline alignment. He is requiring mod-maxAx2 for bed mobility and modAx2 for transfers utilizing the stedy at this time. Will continue to follow acutely.    Recommendations for follow up therapy are one component of a multi-disciplinary discharge planning process, led by the attending physician.  Recommendations may be updated based on patient status, additional functional criteria and insurance authorization.  Follow Up Recommendations       Assistance Recommended at Discharge Frequent or constant Supervision/Assistance  Patient can return  home with the following Two people to help with walking and/or transfers;Two people to help with bathing/dressing/bathroom;Assistance with cooking/housework;Assistance with feeding;Direct supervision/assist for financial management;Direct supervision/assist for medications management;Assist for transportation;Help with stairs or ramp for entrance   Equipment Recommendations  Other (comment) (to be determined in next venue of care)    Recommendations for Other Services       Precautions / Restrictions Precautions Precautions: Fall Precaution Comments: L wrist restraint and mitten Restrictions Weight Bearing Restrictions: No     Mobility  Bed Mobility Overal bed mobility: Needs Assistance Bed Mobility: Supine to Sit, Sit to Supine     Supine to sit: Mod assist, +2 for safety/equipment, HOB elevated Sit to supine: Max assist, +2 for physical assistance, +2 for safety/equipment, HOB elevated   General bed mobility comments: Pt initiated bringing L leg off EOB and pushing up with L UE to sit L EOB, but needed modA to bring R leg off bed surface and provide trunk stability. MaxAx2 to direct trunk to supine and raise legs up onto bed, cuing pt to lean laterally to L elbow    Transfers Overall transfer level: Needs assistance Equipment used: Ambulation equipment used (stedy) Transfers: Sit to/from Stand Sit to Stand: Mod assist, +2 physical assistance, +2 safety/equipment          Lateral/Scoot Transfers: Max assist, +2 safety/equipment, +2 physical assistance General transfer comment: ModAx2 needed to initiate transfers to stand from EOB or stedy flaps with utilization of the stedy for support. Good activation by pt once transfers were initiated. Pt only utilizing his L UE as his R UE would move into a flexion synergy, resulting in him being unable to grasp the stedy bar with his R hand.  MaxAx2 using the bed pads to scoot hips to L towards HOB.    Ambulation/Gait                General Gait Details: unable at this time   Stairs             Wheelchair Mobility    Modified Rankin (Stroke Patients Only) Modified Rankin (Stroke Patients Only) Pre-Morbid Rankin Score: No symptoms Modified Rankin: Severe disability     Balance Overall balance assessment: Needs assistance Sitting-balance support: Feet supported, Single extremity supported Sitting balance-Leahy Scale: Poor Sitting balance - Comments: Pt with R lateral lean, needing modA initially for static sitting balance. Progressed to min guard-minA when pt had the stedy bar anterior to him to hold with his L UE and was provided a mirror for visual feedback. Postural control: Right lateral lean Standing balance support: Reliant on assistive device for balance, During functional activity, Single extremity supported Standing balance-Leahy Scale: Poor Standing balance comment: Min-modA +2 for safety to stand statically in the stedy with L UE support. Pt turns his R leg into external rotation at flexes his R knee in standing, needing tactile cues to extend. Mirror provided for visual feedback to promote midline alignment, pt needing mod-max cues to correct R lateral lean.                            Cognition Arousal/Alertness: Awake/alert Behavior During Therapy: Flat affect Overall Cognitive Status: Impaired/Different from baseline Area of Impairment: Safety/judgement, Awareness, Problem solving, Attention, Memory, Following commands                   Current Attention Level: Sustained Memory: Decreased short-term memory Following Commands: Follows one step commands with increased time, Follows multi-step commands inconsistently, Follows one step commands inconsistently Safety/Judgement: Decreased awareness of safety, Decreased awareness of deficits Awareness: Intellectual Problem Solving: Slow processing, Decreased initiation, Difficulty sequencing, Requires verbal cues, Requires  tactile cues General Comments: Pt nodding head yes/no to respond to questions, inconsistently at times especially as he fatigued. He sometimes shakes his head no before he corrects it to a nod yes. Slow processing and poor awareness of his leaning and problem-solving utilizating a mirror for visual feedback to correct his leaning.        Exercises Other Exercises Other Exercises: PROM to R leg in sitting and supine to assess spasticity, none noted    General Comments General comments (skin integrity, edema, etc.): encouraged grandmother to stretch his R elbow into extension as able and tolerated; no spasticity noted in R leg      Pertinent Vitals/Pain Pain Assessment Pain Assessment: Faces Faces Pain Scale: Hurts little more Pain Location: grimacing with R UE ROM and activity in general Pain Descriptors / Indicators: Discomfort, Grimacing Pain Intervention(s): Limited activity within patient's tolerance, Monitored during session, Repositioned    Home Living                          Prior Function            PT Goals (current goals can now be found in the care plan section) Acute Rehab PT Goals Patient Stated Goal: none stated PT Goal Formulation: With patient/family Time For Goal Achievement: 06/29/23 Potential to Achieve Goals: Good Progress towards PT goals: Progressing toward goals    Frequency    Min 4X/week      PT Plan Current plan remains  appropriate    Co-evaluation PT/OT/SLP Co-Evaluation/Treatment: Yes Reason for Co-Treatment: Complexity of the patient's impairments (multi-system involvement);To address functional/ADL transfers;For patient/therapist safety PT goals addressed during session: Mobility/safety with mobility;Balance;Strengthening/ROM;Proper use of DME        AM-PAC PT "6 Clicks" Mobility   Outcome Measure  Help needed turning from your back to your side while in a flat bed without using bedrails?: A Lot Help needed moving from  lying on your back to sitting on the side of a flat bed without using bedrails?: A Lot Help needed moving to and from a bed to a chair (including a wheelchair)?: Total Help needed standing up from a chair using your arms (e.g., wheelchair or bedside chair)?: Total Help needed to walk in hospital room?: Total Help needed climbing 3-5 steps with a railing? : Total 6 Click Score: 8    End of Session Equipment Utilized During Treatment: Gait belt Activity Tolerance: Patient tolerated treatment well Patient left: in bed;with call bell/phone within reach;with bed alarm set;with restraints reapplied;with family/visitor present Nurse Communication: Mobility status PT Visit Diagnosis: Unsteadiness on feet (R26.81);Other abnormalities of gait and mobility (R26.89);Muscle weakness (generalized) (M62.81);Difficulty in walking, not elsewhere classified (R26.2);Other symptoms and signs involving the nervous system (R29.898)     Time: 4132-4401 PT Time Calculation (min) (ACUTE ONLY): 33 min  Charges:  $Neuromuscular Re-education: 8-22 mins                     Raymond Gurney, PT, DPT Acute Rehabilitation Services  Office: [phone]    Jewel Baize 06/22/2023, 1:34 PM

## 2023-06-22 NOTE — Progress Notes (Signed)
Occupational Therapy Treatment Patient Details Name: Zachary Winters MRN: [id number] DOB: [birth year] Today's Date: 06/22/2023   History of present illness Zachary Winters is a 42 year old gentleman who presents 6/19 to the [facility] emergency department by [facility] with right-sided weakness right-sided facial droop and difficulty speaking. 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 6/23 pt developed acute worsening of swallow function; MRI showing interval increase in size of acute L MCA infarction.Transported to [facility] 6/25. History of poorly controlled diabetes mellitus on insulin, chronic daily smoker, hypertension, history of marijuana use, diabetic peripheral neuropathy, and hyperlipidemia.   OT comments  Focused session on sitting/standing balance in [place] and command following. Overall, pt requires Mod A x 2 for bed mobility and standing trials in [place] w/ R lateral lean tendency. Use of mirror for visual feedback appeared helpful. Pt also noted with increased tone in R elbow (flexor pattern which increased w/ activity) though with increased time, able to stretch [facility]. Will continue to monitor for potential elbow splinting needs.    Recommendations for follow up therapy are one component of a multi-disciplinary discharge planning process, led by the attending physician.  Recommendations may be updated based on patient status, additional functional criteria and insurance authorization.    Assistance Recommended at Discharge Frequent or constant Supervision/Assistance  Patient can return home with the following  A lot of help with walking and/or transfers;A lot of help with bathing/dressing/bathroom;Assistance with cooking/housework;Assistance with feeding;Assist for transportation;Direct supervision/assist for medications  management;Help with stairs or ramp for entrance   Equipment Recommendations  None recommended by OT    Recommendations for Other Services Rehab consult    Precautions / Restrictions Precautions Precautions: Fall Precaution Comments: L wrist restraint and mitten, cortrak Restrictions Weight Bearing Restrictions: No       Mobility Bed Mobility Overal bed mobility: Needs Assistance Bed Mobility: Supine to Sit, Sit to Supine     Supine to sit: Mod assist, +2 for safety/equipment, HOB elevated Sit to supine: Max assist, +2 for physical assistance, +2 for safety/equipment, HOB elevated   General bed mobility comments: Pt initiated bringing L leg off EOB and pushing up with L UE to sit L EOB, but needed modA to bring R leg off bed surface and provide trunk stability. MaxAx2 to direct trunk to supine and raise legs up onto bed, cuing pt to lean laterally to L elbow    Transfers Overall transfer level: Needs assistance Equipment used: Ambulation equipment used (stedy) Transfers: Sit to/from Stand Sit to Stand: Mod assist, +2 physical assistance, +2 safety/equipment          Lateral/Scoot Transfers: Max assist, +2 safety/equipment, +2 physical assistance General transfer comment: ModAx2 needed to initiate transfers to stand from EOB or stedy flaps with utilization of the stedy for support. Good activation by pt once transfers were initiated. Pt only utilizing his L UE as his R UE would move into a flexion synergy, resulting in him being unable to grasp the stedy bar with his R hand. MaxAx2 using the bed pads to scoot hips to L towards HOB.     Balance Overall balance assessment: Needs assistance Sitting-balance support: Feet supported, Single extremity supported Sitting balance-Leahy Scale: Poor Sitting balance - Comments: Pt with R lateral lean, needing modA initially for static sitting balance. Progressed to min guard-minA when pt had the  stedy bar anterior to him to hold with  his L UE and was provided a mirror for visual feedback. Postural control: Right lateral lean Standing balance support: Reliant on assistive device for balance, During functional activity, Single extremity supported Standing balance-Leahy Scale: Poor Standing balance comment: Min-modA +2 for safety to stand statically in the stedy with L UE support. Pt turns his R leg into external rotation at flexes his R knee in standing, needing tactile cues to extend. Mirror provided for visual feedback to promote midline alignment, pt needing mod-max cues to correct R lateral lean.                           ADL either performed or assessed with clinical judgement   ADL                                              Extremity/Trunk Assessment Upper Extremity Assessment Upper Extremity Assessment: RUE deficits/detail RUE Deficits / Details: Significaly more tone in elbow and shoulder; tone increased with activity (sitting/standing EOB). Increased time, able to fully stretch and then able to stretch more easily in bed; consider pillow splint to R elbow if tone continues RUE Coordination: decreased fine motor;decreased gross motor            Vision       Perception     Praxis      Cognition Arousal/Alertness: Awake/alert Behavior During Therapy: Flat affect Overall Cognitive Status: Impaired/Different from baseline Area of Impairment: Safety/judgement, Awareness, Problem solving, Attention, Memory, Following commands                   Current Attention Level: Sustained Memory: Decreased short-term memory Following Commands: Follows one step commands with increased time, Follows multi-step commands inconsistently, Follows one step commands inconsistently Safety/Judgement: Decreased awareness of safety, Decreased awareness of deficits Awareness: Intellectual Problem Solving: Slow processing, Decreased initiation, Difficulty sequencing, Requires verbal cues,  Requires tactile cues General Comments: Pt nodding head yes/no to respond to questions, inconsistently at times especially as he fatigued. He sometimes shakes his head no before he corrects it to a nod yes. Slow processing and poor awareness of his leaning and problem-solving utilizating a mirror for visual feedback to correct his leaning.        Exercises Exercises: Other exercises Other Exercises Other Exercises: PROM to RUE; increased time to stretch elbow into extension Other Exercises: Finding self in mirror/correcting balance sitting/standing EOB w/ Stedy    Shoulder Instructions       General Comments encouraged grandmother to stretch his R elbow into extension as able and tolerated; and keep digits resting in extension (able to be easily stretched)    Pertinent Vitals/ Pain       Pain Assessment Pain Assessment: Faces Faces Pain Scale: Hurts little more Pain Location: grimacing with R UE ROM and activity in general Pain Descriptors / Indicators: Discomfort, Grimacing Pain Intervention(s): Limited activity within patient's tolerance, Monitored during session  Home Living                                          Prior Functioning/Environment              Frequency  Min 2X/week  Progress Toward Goals  OT Goals(current goals can now be found in the care plan section)  Progress towards OT goals: OT to reassess next treatment  Acute Rehab OT Goals Patient Stated Goal: none stated OT Goal Formulation: With patient Time For Goal Achievement: 06/29/23 Potential to Achieve Goals: Good ADL Goals Pt Will Perform Eating: with min guard assist;with adaptive utensils;sitting Pt Will Perform Grooming: with min assist;sitting Pt Will Perform Upper Body Bathing: with supervision;sitting Pt Will Perform Upper Body Dressing: with supervision;sitting Pt Will Perform Lower Body Dressing: with mod assist;bed level Pt Will Transfer to Toilet: with min  guard assist;stand pivot transfer Pt Will Perform Toileting - Clothing Manipulation and hygiene: with min guard assist;sitting/lateral leans Pt/caregiver will Perform Home Exercise Program: Increased ROM;Increased strength;Right Upper extremity;With minimal assist Additional ADL Goal #1: Pt will demonstrate improved visual processing by locating objects in R field of view with set up assist 50% of the time.  Plan Discharge plan remains appropriate    Co-evaluation    PT/OT/SLP Co-Evaluation/Treatment: Yes Reason for Co-Treatment: Complexity of the patient's impairments (multi-system involvement);To address functional/ADL transfers;For patient/therapist safety PT goals addressed during session: Mobility/safety with mobility;Balance;Strengthening/ROM;Proper use of DME OT goals addressed during session: ADL's and self-care      AM-PAC OT "6 Clicks" Daily Activity     Outcome Measure   Help from another person eating meals?: Total Help from another person taking care of personal grooming?: A Lot Help from another person toileting, which includes using toliet, bedpan, or urinal?: Total Help from another person bathing (including washing, rinsing, drying)?: A Lot Help from another person to put on and taking off regular upper body clothing?: A Lot Help from another person to put on and taking off regular lower body clothing?: Total 6 Click Score: 9    End of Session Equipment Utilized During Treatment: Gait belt  OT Visit Diagnosis: Unsteadiness on feet (R26.81);Other abnormalities of gait and mobility (R26.89);Muscle weakness (generalized) (M62.81);Other symptoms and signs involving the nervous system (R29.898);Cognitive communication deficit (R41.841);Hemiplegia and hemiparesis Symptoms and signs involving cognitive functions: Cerebral infarction Hemiplegia - Right/Left: Right Hemiplegia - dominant/non-dominant: Dominant Hemiplegia - caused by: Cerebral infarction   Activity Tolerance  Patient tolerated treatment well   Patient Left in bed;with call bell/phone within reach;with bed alarm set;with family/visitor present   Nurse Communication          Time: 8657-8469 OT Time Calculation (min): 32 min  Charges: OT General Charges $OT Visit: 1 Visit OT Treatments $Therapeutic Activity: 8-22 mins  Bradd Canary, OTR/L Acute Rehab Services Office: [phone]   Lorre Munroe 06/22/2023, 2:25 PM

## 2023-06-22 NOTE — Progress Notes (Addendum)
STROKE TEAM PROGRESS NOTE   INTERVAL HISTORY [name] with his grandmother at the bedside.  He has been hemodynamically stable overnight, and his neurological exam remains stable.  Plan is for stenting of left MCA on Tuesday.  Vitals:   06/22/23 0500 06/22/23 0747 06/22/23 0800 06/22/23 1127  BP:  (!) 196/91 (!) 165/80 (!) 183/92  Pulse:  81  89  Resp:    18  Temp:  98.6 F (37 C)  98 F (36.7 C)  TempSrc:  Oral  Oral  SpO2:  97%  100%  Weight: 86.1 kg     Height:       CBC:  Recent Labs  Lab 06/16/23 0416 06/21/23 0743  WBC 7.4 9.8  HGB 11.1* 9.2*  HCT 34.0* 27.9*  MCV 91.4 88.9  PLT 237 252    Basic Metabolic Panel:  Recent Labs  Lab 06/21/23 0743 06/21/23 1721 06/22/23 0713  NA 145  --  143  K 3.6  --  3.6  CL 110  --  112*  CO2 19*  --  23  GLUCOSE 133*  --  136*  BUN 9  --  7  CREATININE 1.57*  --  1.27*  CALCIUM 8.5*  --  8.6*  MG  --  2.0 1.8  PHOS  --  2.8 2.7    Lipid Panel: No results for input(s): "CHOL", "TRIG", "HDL", "CHOLHDL", "VLDL", "LDLCALC" in the last 168 hours. HgbA1c: No results for input(s): "HGBA1C" in the last 168 hours. Urine Drug Screen:  Recent Labs  Lab 06/15/23 1345  LABOPIA NONE DETECTED  COCAINSCRNUR NONE DETECTED  LABBENZ NONE DETECTED  AMPHETMU NONE DETECTED  THCU POSITIVE*  LABBARB NONE DETECTED     Alcohol Level No results for input(s): "ETH" in the last 168 hours.  IMAGING past 24 hours CT ANGIO HEAD NECK W WO CM  Result Date: 06/21/2023 CLINICAL DATA:  Stroke, determine embolic source EXAM: CT ANGIOGRAPHY HEAD AND NECK WITH AND WITHOUT CONTRAST TECHNIQUE: Multidetector CT imaging of the head and neck was performed using the standard protocol during bolus administration of intravenous contrast. Multiplanar CT image reconstructions and MIPs were obtained to evaluate the vascular anatomy. Carotid stenosis measurements (when applicable) are obtained utilizing NASCET criteria, using the distal  internal carotid diameter as the denominator. RADIATION DOSE REDUCTION: This exam was performed according to the departmental dose-optimization program which includes automated exposure control, adjustment of the mA and/or kV according to patient size and/or use of iterative reconstruction technique. CONTRAST:  75mL OMNIPAQUE IOHEXOL 350 MG/ML SOLN COMPARISON:  06/14/2023 CT head, MRI head, and MRA head; 06/19/2023 MRI head FINDINGS: CT HEAD FINDINGS Brain: Hypodensity throughout the left basal ganglia, extending into the lateral thalamus, as well as in the left frontal and temporal lobes, which is more extensive than on the 06/14/2023 CT but correlates with the area of infarction on the 06/19/2023 MRI. Some increased density in the left basal ganglia correlates with petechial hemorrhage in this area. Mass effect on the left lateral ventricle, with 3 mm of right to left midline shift. A small amount of hypodensity in the right posterior limb the internal capsule correlates with an additional acute infarct in this area. No additional area of infarct. No other parenchymal hemorrhage. No mass, hydrocephalus, or extra-axial collection. Vascular: No hyperdense vessel. Skull: Negative for fracture or focal lesion. Sinuses/Orbits: Mucosal thickening in the ethmoid air cells and frontal sinuses. No acute finding in the orbits. Other: Nasogastric tube noted.  CTA NECK FINDINGS Aortic arch: Two-vessel arch with a common origin of the brachiocephalic and left common carotid arteries. Imaged portion shows no evidence of aneurysm or dissection. No significant stenosis of the major arch vessel origins. Right carotid system: No evidence of stenosis, dissection, or occlusion. Left carotid system: No evidence of stenosis, dissection, or occlusion. Vertebral arteries: Multifocal irregularity and mild-to-moderate stenosis in the left V1 and V2 segments (series 16, images 217, 255, 283, 300). Multifocal irregularity and mild stenosis in  the right V1 and proximal V2 segment (series 16, image 256 and 268). The more distal V2 and V3 segments are patent bilaterally. No evidence of dissection. Skeleton: No acute osseous abnormality. Degenerative changes in the cervical spine. Other neck: No acute finding. Upper chest: Heterogeneous opacities in the dependent right upper lobe (series 16, image 44, for example), which appears increased compared to the 06/15/2023 CT chest. No pleural effusion. Review of the MIP images confirms the above findings CTA HEAD FINDINGS Anterior circulation: Both internal carotid arteries are patent to the termini, with mild stenosis in the left cavernous segment, moderate stenosis in the right supraclinoid segment, and severe stenosis in the left supraclinoid segment. Poor opacification of the left ICA terminus. A1 segments patent, hypoplastic on the left. Normal anterior communicating artery. Anterior cerebral arteries are patent to their distal aspects without significant stenosis. Severe, near occlusive stenosis in the left M1 (series 16, image [phone]), with poor perfusion of the proximal M2 branches. Slightly better perfusion is seen in the posterior left MCA branches, some of which may be due to collaterals. The right M1 and MCA branches are perfused to their distal aspects without significant stenosis. Posterior circulation: Severe stenosis in the left V4 (series 16, images 453-470), proximal to the PICA takeoff. The distal left V4 is patent without significant stenosis. The right V4 is patent to the vertebrobasilar junction without significant stenosis. Posteroinferior cerebellar arteries are patent bilaterally. Basilar patent to its distal aspect without significant stenosis. Superior cerebellar arteries patent proximally. Patent P1 segments. PCAs perfused to their distal aspects without significant stenosis. The bilateral posterior communicating arteries are not visualized. Venous sinuses: As permitted by contrast  timing, patent. Anatomic variants: None significant. Review of the MIP images confirms the above findings IMPRESSION: 1. Evolving infarcts in the left basal ganglia, left frontal and temporal lobes, and right posterior limb of the internal capsule, with petechial hemorrhage in the left basal ganglia, as seen on the 06/19/2023 MRI. Mass effect on the left lateral ventricle with 3 mm of left-to-right midline shift. 2. Severe, near occlusive stenosis in the left M1, with poor perfusion of the proximal M2 branches, and slightly better perfusion in the posterior left MCA branches, some of which may be due to collaterals. 3. Severe stenosis in the left V4, proximal to the PICA takeoff. 4. Severe stenosis in the left supraclinoid ICA, moderate stenosis in the right supraclinoid ICA, and mild stenosis in the left cavernous ICA. 5. Multifocal irregularity and mild-to-moderate stenosis in the left V1 and V2 segments, and mild stenosis in the right V1 and proximal V2 segments. 6. No other hemodynamically significant stenosis in the neck. 7. Heterogeneous opacities in the dependent right upper lobe, which appears increased compared to the 06/15/2023 CT chest and could represent aspiration or infection. Electronically Signed   By: Wiliam Ke M.D.   On: 06/21/2023 18:51    PHYSICAL EXAM General: Patient with global aphasia resting in bed with eyes open in no acute distress Respiratory: Regular, unlabored  respirations on room air Neurologic: Patient with global aphasia does not respond to name but intermittently follows commands.  He blinks to threat on the left but not on the right and has a left gaze preference but can cross midline.  Right facial droop noted, moves left upper and lower extremities spontaneously and will withdraw to right upper and lower extremities to noxious stimuli.  ASSESSMENT/PLAN Zachary Winters is a 42 y.o. male with history of diabetes, hypertension, hyperlipidemia and smoking who was  initially admitted to [facility] on 6/19 with right hemiplegia and aphasia.  He presented outside the window for TNK and thrombectomy.  He was found to have a left basal ganglia infarct, which is now extended into much of the left MCA territory, and he also developed a small acute infarct in posterior limb of right internal capsule.  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  Yesterday, he was transferred here to be seen by stroke team and possibly for conventional angiogram given severe stenosis.  Admission urine toxicology was positive for THC.  Patient's grandmother states that he does not often go to the doctor and does smoke cigarettes.  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  Similar petechial hemorrhage.  New interval small acute infarct in the right PLIC CTA head and neck severe, near occlusive stenosis in left M1 with poor perfusion of proximal M2 branches, severe stenosis in left V4, severe stenosis in left supraclinoid ICA, moderate stenosis in right supraclinoid ICA, multifocal irregularity and mild to moderate stenosis in left V1 and V2 segments, mild stenosis in right V1 and proximal V2 segments Carotid Doppler no significant plaque formation 2D Echo EF 60 to 65%  LDL 265 HgbA1c 11.7 VTE prophylaxis -Lovenox No antithrombotic prior to admission, now on aspirin 81 mg daily and clopidogrel 75 mg daily for at least 3 months due to severe M1 stenosis. Therapy recommendations: CIR Disposition:  Pending  ICA and MCA stenosis CT head and neck severe, near occlusive stenosis in left M1 with poor perfusion of proximal M2 branches, severe stenosis in left supraclinoid ICA, moderate stenosis in right supraclinoid ICA Plan for angiogram with intent of stenting next Tuesday given petechial hemorrhage  Hypertension Home meds: None Stable Outside of permissive hypertension window On amlodipine 5 BP goal 140-180 before the angiogram next Tuesday  Long-term BP goal normotensive  Hyperlipidemia Home meds:  Atorvastatin 10 mg daily  LDL 265, goal < 70 Lipitor increased to 80 Continue statin at discharge  Diabetes type II Uncontrolled Home meds: Lantus 30 units daily HgbA1c 11.7, goal < 7.0 CBGs SSI Close PCP follow-up for better DM control  Tobacco abuse Current smoker Smoking cessation counseling will be provided  AKI Creatinine 1.57-1.45-1.57->1.27 On IV fluid and TF CVP monitoring  Dysphagia Did not pass a swallow Currently n.p.o. Core track placed On tube feeding Speech on board  Other Stroke Risk Factors Substance abuse - UDS:  THC POSITIVE. Patient will be advised to stop using due to stroke risk.  Other Active Problems None  Hospital  day # 8  [name] , MSN, AGACNP-BC [facility] See Amion for schedule and pager information 06/22/2023 1:04 PM  ATTENDING NOTE: I reviewed above note and agree with the assessment and plan. Pt was seen and examined.   Grandma is at bedside.  Patient lying in bed, neuro unchanged, still has global aphasia, right hemiplegia and right neglect.  CTA showed severe near occlusive left M1, severe stenosis left ICA siphon, which explains patient large vessel stroke this time.  Discussed with IR Dr. Sherlon Handing, plan for next Tuesday cerebral angiogram with intent of stenting if possible.  Continue permissive hypertension with BP goal 1 40-80 before intervention.  Continue DAPT and statin.  Creatinine improved,  on IV fluid and tube feeding.  Will follow.  For detailed assessment and plan, please refer to above/below as I have made changes wherever appropriate.   Marvel Plan, MD PhD Stroke Neurology 06/22/2023 6:46 PM      To contact Stroke Continuity provider, please refer to [email or web address]. After hours, contact General Neurology

## 2023-06-23 ENCOUNTER — Inpatient Hospital Stay (HOSPITAL_COMMUNITY): Payer: Medicaid Other

## 2023-06-23 DIAGNOSIS — I639 Cerebral infarction, unspecified: Secondary | ICD-10-CM | POA: Diagnosis not present

## 2023-06-23 LAB — GLUCOSE, CAPILLARY
Glucose-Capillary: 176 mg/dL — ABNORMAL HIGH (ref 70–99)
Glucose-Capillary: 223 mg/dL — ABNORMAL HIGH (ref 70–99)
Glucose-Capillary: 182 mg/dL — ABNORMAL HIGH (ref 70–99)
Glucose-Capillary: 245 mg/dL — ABNORMAL HIGH (ref 70–99)
Glucose-Capillary: 181 mg/dL — ABNORMAL HIGH (ref 70–99)
Glucose-Capillary: 222 mg/dL — ABNORMAL HIGH (ref 70–99)
Glucose-Capillary: 199 mg/dL — ABNORMAL HIGH (ref 70–99)
Glucose-Capillary: 229 mg/dL — ABNORMAL HIGH (ref 70–99)

## 2023-06-23 LAB — BASIC METABOLIC PANEL
Anion gap: 10 (ref 5–15)
BUN: 9 mg/dL (ref 6–20)
CO2: 24 mmol/L (ref 22–32)
Calcium: 8.6 mg/dL — ABNORMAL LOW (ref 8.9–10.3)
Chloride: 109 mmol/L (ref 98–111)
Creatinine, Ser: 1.29 mg/dL — ABNORMAL HIGH (ref 0.61–1.24)
GFR, Estimated: >60 mL/min (ref >60)
Glucose, Bld: 236 mg/dL — ABNORMAL HIGH (ref 70–99)
Potassium: 3.4 mmol/L — ABNORMAL LOW (ref 3.5–5.1)
Sodium: 143 mmol/L (ref 135–145)

## 2023-06-23 LAB — PHOSPHORUS: Phosphorus: 3.8 mg/dL (ref 2.5–4.6)

## 2023-06-23 LAB — MAGNESIUM: Magnesium: 1.7 mg/dL (ref 1.7–2.4)

## 2023-06-23 MED ORDER — AMLODIPINE BESYLATE 10 MG PO TABS
10.0000 mg | ORAL_TABLET | Freq: Every day | ORAL | Status: DC
  Administered 2023-06-23 – 2023-06-27 (×5): 10 mg via NASOGASTRIC
  Filled 2023-06-23 (×5): qty 1

## 2023-06-23 MED ORDER — CHOLECALCIFEROL 10 MCG (400 UNIT) PO TABS
400.0000 [IU] | ORAL_TABLET | Freq: Every day | ORAL | Status: DC
  Administered 2023-06-23 – 2023-06-27 (×5): 400 [IU]
  Filled 2023-06-23 (×5): qty 1

## 2023-06-23 MED ORDER — INSULIN GLARGINE-YFGN 100 UNIT/ML ~~LOC~~ SOLN
5.0000 [IU] | Freq: Every day | SUBCUTANEOUS | Status: DC
  Administered 2023-06-23: 5 [IU] via SUBCUTANEOUS
  Filled 2023-06-23 (×2): qty 0.05

## 2023-06-23 MED ORDER — CARVEDILOL 6.25 MG PO TABS
6.2500 mg | ORAL_TABLET | Freq: Two times a day (BID) | ORAL | Status: DC
  Administered 2023-06-23 – 2023-06-27 (×9): 6.25 mg via ORAL
  Filled 2023-06-23 (×9): qty 1

## 2023-06-23 MED ORDER — POTASSIUM CHLORIDE 10 MEQ/100ML IV SOLN
10.0000 meq | Freq: Once | INTRAVENOUS | Status: AC
  Administered 2023-06-23: 10 meq via INTRAVENOUS
  Filled 2023-06-23: qty 100

## 2023-06-23 NOTE — Inpatient Diabetes Management (Signed)
Inpatient Diabetes Program Recommendations  AACE/ADA: New Consensus Statement on Inpatient Glycemic Control (2015)  Target Ranges:  Prepandial:   less than 140 mg/dL      Peak postprandial:   less than 180 mg/dL (1-2 hours)      Critically ill patients:  140 - 180 mg/dL   Lab Results  Component Value Date   GLUCAP 181 (H) 06/23/2023   HGBA1C 11.7 (H) 06/14/2023    Review of Glycemic Control  Latest Reference Range & Units 06/22/23 08:33 06/22/23 11:54 06/22/23 16:41 06/22/23 20:11 06/23/23 01:05 06/23/23 04:16 06/23/23 06:26 06/23/23 07:50  Glucose-Capillary 70 - 99 mg/dL 409 (H) 811 (H) 914 (H) 182 (H) 223 (H) 182 (H) 245 (H) 181 (H)   Current orders for Inpatient glycemic control: Novolog 0-9 units Q6 hours  Note: pt on Tube Feeds now Glucerna 60 ml/hour, glucose trends increased to 180-200's.  Inpatient Diabetes Program Recommendations:    -  consider increasing the frequency of Novolog Correction to Q4 hours  Thanks,  Christena Deem RN, MSN, BC-ADM Inpatient Diabetes Coordinator Team Pager [phone] (8a-5p)

## 2023-06-23 NOTE — Progress Notes (Signed)
Modified Barium Swallow Study  Patient Details  Name: Zachary Winters MRN: [id number] Date of Birth: [birth year]  Today's Date: 06/23/2023  Modified Barium Swallow completed.  Full report located under Chart Review in the Imaging Section.  History of Present Illness Pt is a 42 year old male who presented with right-sided weakness, right-sided facial droop and difficulty speaking. 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 BSE 6/19 with minimal R anterior spillage, decreased coordination and R anterior spillage; dysphagia 2 diet with thin liquids recommended. 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 Similar petechial hemorrhage without mass occupying acute hemorrhage. New/interval small acute infarct in the right posterior limb of the internal capsule. MBS 6/24: mod/severe oral phase dysphagia and mild/mod pharyngeal phase dysphagia characterized by weak lingual movement and bolus manipulation with labial spillage, oral holding, and spillover to the pharynx. An NPO status recommended with snacks of puree and honey thick liquids. PMH: diabetes mellitus on insulin, chronic daily smoker, hypertension, history of marijuana use, diabetic peripheral neuropathy, hyperlipidemia   Clinical Impression Pt presents with oropharyngeal dysphagia characterized by characterized by weak bolus manipulation, a pharyngeal delay and reduction in labial seal, bolus cohesion, tongue base retraction, and anterior laryngeal movement. Pt demonstrated right-sided anterior spillage across liquid consistenties, impaired A-P transport, inadequate lingual movement for mastication (bolus ultimately removed), and premature spillage to the pyriform sinuses. Pt intermittently used  a posterior head tilt to facilitate A-P transport, but moderate oral residue remained in the lateral sulci and floor of the mouth. Penetration (PAS 3) and aspiration (PAS 7) were inconsistently noted nectar thick liquids via cup secondary to premature spillage combined with the pharyngeal delay. Pt's violent cough projected oral residue across the fluoro suite and propelled some aspirated material superiorly, but was ineffective in expelling all of the aspirated material. No instances of laryngeal invasion were observed with thin liquids, but this is likely since significant anterior spillage and oral residue resulted in pt only swallowing very small boluses of thin liquids. Pt's overall swallow function is improved with aspiration now triggering a violent cough, pt more consistently swallowing, and having improved bolus manipulation but pt's dysphagia remains significant. A dysphagia 1 diet with honey thick liquids is recommended at this time and SLP will continue to follow for treatment.  Factors that may increase risk of adverse event in presence of aspiration Zachary Winters & Zachary Winters 2021): Limited mobility;Dependence for feeding and/or oral hygiene;Inadequate oral hygiene  Swallow Evaluation Recommendations Recommendations: PO diet PO Diet Recommendation: Dysphagia 1 (Pureed);Moderately thick liquids (Level 3, honey thick) Liquid Administration via: Spoon;Cup Medication Administration: Crushed with puree (or via cortrak) Supervision: Full supervision/cueing for swallowing strategies;Staff to assist with self-feeding Swallowing strategies  : Slow rate;Small bites/sips;Check for pocketing or oral holding;Check for anterior loss;Multiple dry swallows after each bite/sip Postural changes: Position pt fully upright for meals;Stay upright 30-60 min after meals Oral care recommendations: Staff/trained caregiver to provide oral care;Oral care before ice chips/water Caregiver Recommendations: Have oral suction  available   Zachary Winters I. Vear Clock, MS, CCC-SLP Neuro Diagnostic Specialist  Acute Rehabilitation Services Office number: [phone]  Zachary Winters 06/23/2023,3:04 PM

## 2023-06-23 NOTE — Plan of Care (Signed)
  Problem: Education: Goal: Knowledge of disease or condition will improve Outcome: Progressing Goal: Knowledge of secondary prevention will improve (MUST DOCUMENT ALL) Outcome: Progressing Goal: Knowledge of patient specific risk factors will improve (Mark N/A or DELETE if not current risk factor) Outcome: Progressing   Problem: Ischemic Stroke/TIA Tissue Perfusion: Goal: Complications of ischemic stroke/TIA will be minimized Outcome: Progressing   Problem: Coping: Goal: Will verbalize positive feelings about self Outcome: Progressing Goal: Will identify appropriate support needs Outcome: Progressing   

## 2023-06-23 NOTE — Progress Notes (Signed)
PROGRESS NOTE Zachary Winters  QQV:[id number] DOB: 1981-04-24 DOA: 06/14/2023 PCP: System, Provider Not In  Brief Narrative/Hospital Course: 42 year old gentleman with poorly controlled diabetes mellitus on insulin, chronic daily smoker, hypertension, history of marijuana use, diabetic peripheral neuropathy, hyperlipidemia was sent to the emergency department by [facility] with right-sided weakness right-sided facial droop and difficulty speaking.  He was last known to be well 2 days ago at 6 AM when his sister noticed he went out to smoke on ring camera.  He apparently lives with his sister.  Sister noticed the patient lying on the floor having a difficult time communicating only able to nod yes and no.  She noticed that for the last 2 days he has been lying around and sleeping. In ED: had significant aphasia and right-sided hemiparesis.  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  His blood glucose was 109.  Code Stroke was not activated due to being 48 hours since last known well time.  06/14/23:Admitted at [facility]> MRI brain:acute to subacute perforator infarct at the left basal ganglia and adjacent white matter with petechial hemorrhage. 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 Subsequently underwent TTE, TEE that was negative for hyper coagulable labs ordered seen by neuro PT OT speech, CT chest abdomen pelvis unremarkable and occult malignancy. 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 New/interval small acute infarct in the right posterior limb of the internal capsule> neurology was notified advised to transfer to [facility] for further neurology  management. 06/20/23 night patient arrived at [facility] 06/21/23;Cortrak ordered  Subjective: Seen and examined this morning.  He is alert awake following commands but has global aphasia.  Mother at the bedside tells me  he said I love you Overnight afebrile BP slowly trending down 169/100 this morning Not hypoxic Labs showed mild hypokalemia blood sugar in 222 AT Times coughing during secretion management with suction  Assessment and Plan: Principal Problem:   Acute stroke due to ischemia [facility]) Active Problems:   Hypertension associated with diabetes (HCC)   Type 2 diabetes mellitus with diabetic neuropathy (HCC)   Hyperlipidemia associated with type 2 diabetes mellitus (HCC)   Dysphonia   Aphasia   Tobacco use disorder   Recreational drug use   Diabetic neuropathy (HCC)   Vitamin D deficiency   Apraxia  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 Plan for angiogram with intent of stenting next Tuesday given potential hemorrhage.  Continue high intensity statin, aspirin 81 Plavix 75 x 3 months for now per neurology Continue Dobbhoff tube feeding, PT OT speech therapy Has global aphasia, core track placed for nutrition.Remains to be seen home how much functional recovery he will make-discussed about risk of aspiration, possible need for long-term feeding if remains dysphagic.  Continue oral suctioning as he has been having cough.  Dysphagia: due to stroke core track placed for nutrition, speech  following  Hypertension: BP poorly controlled increasing amlodipine to 10 mg, if still high will add additional agent  cont prn labetalol   Hyperlipidemia:  LDL 265 started on Atorvastatin 80 mg  T2DM uncontrolled hyperglycemia A1c 9.7, noncompliant, with diabetic peripheral neuropathy: Blood sugar currently fairly controlled, continue SSI, ADD Semglee 5 units daily Recent Labs  Lab 06/23/23 0105 06/23/23 0416 06/23/23 0626 06/23/23 0750 06/23/23 1118  GLUCAP 223* 182* 245* 181* 222*    AKI vs CKD 3a Metabolic acidosis: Creatinine previously 1.0 in 2022 on admission 1.5, creatinine improved to 1.2 with ongoing IV fluid hydration, metabolic acidosis improved.  Cont ivf. Recent Labs    06/14/23 0911 06/15/23 0514 06/16/23 0416 06/21/23 0743 06/22/23 0713 06/23/23 0628  BUN 19 16 14 9 7 9   CREATININE 1.57* 1.49* 1.45* 1.57* 1.27* 1.29*  CO2 27 25 24  19* 23 24     Anemia hemoglobin slowly downtrending monitor no obvious bleeding noted, likely dilutional Recent Labs  Lab 06/21/23 0743  HGB 9.2*  HCT 27.9*    Hypokalemia replaced/recheck. Recent Labs  Lab 06/21/23 0743 06/22/23 0713 06/23/23 0628  K 3.6 3.6 3.4*     Severe vitamin D deficiency: Unable to use weekly high-dose vitamin D replacement w/ NGT.  Continue vitamin D liquid daily until able to take p.o.  Tobacco abuse: will need to stop smoking.  Continue scheduled nicotine patch  Recreational drug use UDS positive for marijuana: TOC consult for resources  Deconditioning debility Continue aggressive PT OT speech eval. use restraint on the left side to protect core track tube.  Right upper lobe predominant tree-in-bud pattern suggesting chronic inflammation atypical infection such as MAC: Will refer him out to pulmonary clinic at discharge for further evaluation of this.  At risk of aspiration continue suctioning  CT noted advanced coronary calcifications:Will refer him out to cardiology at discharge for surveillance.   DVT prophylaxis: enoxaparin (LOVENOX) injection 40 mg Start: 06/14/23 1045 Code Status:   Code Status: Full Code Family Communication:  plan of care discussed with patient/mother at bedside. Patient status is:  inpatient because of acute stroke Level of care: Telemetry Medical   Dispo: The patient is from: Home            Anticipated disposition: CIR Objective: Vitals last 24 hrs: Vitals:   06/23/23 0353 06/23/23 0425 06/23/23 0729 06/23/23 1114  BP: (!) 174/94  (!) 175/89 (!) 169/100  Pulse: 86  82 88  Resp:   19 17  Temp: 98.1 F (36.7 C)  97.8 F (36.6 C) 98.3 F (36.8 C)  TempSrc: Oral  Oral Oral  SpO2: 99%  99% 99%  Weight:  87.9 kg    Height:       Weight change: 1.8 kg Physical exam General exam: alert awake, aphasic follows commands  HEENT:Oral mucosa moist, Ear/Nose WNL grossly Respiratory system: Bilaterally clear BS,no use of accessory muscle Cardiovascular system: S1 & S2 +, No JVD. Gastrointestinal system: Abdomen soft,NT,ND, BS+ Nervous System: Alert, awake, moving left side wll, w/ weakness on rt side Extremities: LE edema neg,distal peripheral pulses palpable and warm.  Skin: No rashes,no icterus. MSK: Normal muscle bulk,tone, power  Cortrak+  Medications reviewed:  Scheduled Meds:  amLODipine  10 mg Per NG tube Daily   aspirin  81 mg Per Tube Daily   atorvastatin  80 mg Per Tube QPM   cholecalciferol  400 Units Per Tube Daily   clopidogrel  75 mg Per NG tube Daily   enoxaparin (LOVENOX)  injection  40 mg Subcutaneous Daily   insulin aspart  0-9 Units Subcutaneous Q6H   nicotine  21 mg Transdermal Daily  Continuous Infusions:  sodium chloride 75 mL/hr at 06/23/23 0332   feeding supplement (GLUCERNA 1.5 CAL) 60 mL/hr at 06/23/23 0131    Diet Order             Diet NPO time specified  Diet effective now                 Nutrition Problem: Inadequate oral intake Etiology: dysphagia Signs/Symptoms: NPO status Interventions: Refer to RD note for recommendations   Intake/Output Summary (Last 24 hours) at 06/23/2023 1334 Last data filed at 06/23/2023 1230 Gross per 24 hour   Intake 3570.54 ml  Output 1875 ml  Net 1695.54 ml    Net IO Since Admission: -143.88 mL [06/23/23 1334]  Wt Readings from Last 3 Encounters:  06/23/23 87.9 kg  09/05/21 68 kg  07/31/21 65.3 kg     Unresulted Labs (From admission, onward)     Start     Ordered   06/22/23 0500  Basic metabolic panel  Daily,   R     Question:  Specimen collection method  Answer:  Lab=Lab collect   06/21/23 1310   06/21/23 0500  Creatinine, serum  (enoxaparin (LOVENOX)    CrCl >/= 30 ml/min)  Weekly,   R     Comments: while on enoxaparin therapy    06/14/23 1038   06/17/23 0500  Factor 5 leiden  (Hypercoagulable Panel, Comprehensive (PNL))  Tomorrow morning,   R        06/16/23 1253   06/17/23 0500  Prothrombin gene mutation  (Hypercoagulable Panel, Comprehensive (PNL))  Tomorrow morning,   R        06/16/23 1253          Data Reviewed: I have personally reviewed following labs and imaging studies CBC: Recent Labs  Lab 06/21/23 0743  WBC 9.8  HGB 9.2*  HCT 27.9*  MCV 88.9  PLT 252    Basic Metabolic Panel: Recent Labs  Lab 06/21/23 0743 06/21/23 1721 06/22/23 0713 06/22/23 1631 06/23/23 0628  NA 145  --  143  --  143  K 3.6  --  3.6  --  3.4*  CL 110  --  112*  --  109  CO2 19*  --  23  --  24  GLUCOSE 133*  --  136*  --  236*  BUN 9  --  7  --  9  CREATININE 1.57*  --  1.27*  --  1.29*  CALCIUM 8.5*  --  8.6*  --  8.6*  MG  --  2.0 1.8 1.8 1.7  PHOS  --  2.8 2.7 2.9 3.8    Recent Labs  Lab 06/23/23 0105 06/23/23 0416 06/23/23 0626 06/23/23 0750 06/23/23 1118  GLUCAP 223* 182* 245* 181* 222*   No results found for this or any previous visit (from the past 240 hour(s)).  Antimicrobials: Anti-infectives (From admission, onward)    None      Culture/Microbiology    Component Value Date/Time   SDES  03/14/2021 1728    IN/OUT CATH URINE Performed at [facility], [address] [place], [place] [address]    [facility]  03/14/2021 1728     NONE Performed at [facility], [address] [place], [place] [address]    CULT MULTIPLE SPECIES PRESENT, SUGGEST RECOLLECTION (A) 03/14/2021 1728  REPTSTATUS 03/16/2021 FINAL 03/14/2021 1728    Radiology Studies: CT ANGIO HEAD NECK W WO CM  Result Date: 06/21/2023 CLINICAL DATA:  Stroke, determine embolic source EXAM: CT ANGIOGRAPHY HEAD AND NECK WITH AND WITHOUT CONTRAST TECHNIQUE: Multidetector CT imaging of the head and neck was performed using the standard protocol during bolus administration of intravenous contrast. Multiplanar CT image reconstructions and MIPs were obtained to evaluate the vascular anatomy. Carotid stenosis measurements (when applicable) are obtained utilizing NASCET criteria, using the distal internal carotid diameter as the denominator. RADIATION DOSE REDUCTION: This exam was performed according to the departmental dose-optimization program which includes automated exposure control, adjustment of the mA and/or kV according to patient size and/or use of iterative reconstruction technique. CONTRAST:  75mL OMNIPAQUE IOHEXOL 350 MG/ML SOLN COMPARISON:  06/14/2023 CT head, MRI head, and MRA head; 06/19/2023 MRI head FINDINGS: CT HEAD FINDINGS Brain: Hypodensity throughout the left basal ganglia, extending into the lateral thalamus, as well as in the left frontal and temporal lobes, which is more extensive than on the 06/14/2023 CT but correlates with the area of infarction on the 06/19/2023 MRI. Some increased density in the left basal ganglia correlates with petechial hemorrhage in this area. Mass effect on the left lateral ventricle, with 3 mm of right to left midline shift. A small amount of hypodensity in the right posterior limb the internal capsule correlates with an additional acute infarct in this area. No additional area of infarct. No other parenchymal hemorrhage. No mass, hydrocephalus, or extra-axial collection. Vascular: No hyperdense vessel. Skull: Negative for  fracture or focal lesion. Sinuses/Orbits: Mucosal thickening in the ethmoid air cells and frontal sinuses. No acute finding in the orbits. Other: Nasogastric tube noted. CTA NECK FINDINGS Aortic arch: Two-vessel arch with a common origin of the brachiocephalic and left common carotid arteries. Imaged portion shows no evidence of aneurysm or dissection. No significant stenosis of the major arch vessel origins. Right carotid system: No evidence of stenosis, dissection, or occlusion. Left carotid system: No evidence of stenosis, dissection, or occlusion. Vertebral arteries: Multifocal irregularity and mild-to-moderate stenosis in the left V1 and V2 segments (series 16, images 217, 255, 283, 300). Multifocal irregularity and mild stenosis in the right V1 and proximal V2 segment (series 16, image 256 and 268). The more distal V2 and V3 segments are patent bilaterally. No evidence of dissection. Skeleton: No acute osseous abnormality. Degenerative changes in the cervical spine. Other neck: No acute finding. Upper chest: Heterogeneous opacities in the dependent right upper lobe (series 16, image 44, for example), which appears increased compared to the 06/15/2023 CT chest. No pleural effusion. Review of the MIP images confirms the above findings CTA HEAD FINDINGS Anterior circulation: Both internal carotid arteries are patent to the termini, with mild stenosis in the left cavernous segment, moderate stenosis in the right supraclinoid segment, and severe stenosis in the left supraclinoid segment. Poor opacification of the left ICA terminus. A1 segments patent, hypoplastic on the left. Normal anterior communicating artery. Anterior cerebral arteries are patent to their distal aspects without significant stenosis. Severe, near occlusive stenosis in the left M1 (series 16, image [phone]), with poor perfusion of the proximal M2 branches. Slightly better perfusion is seen in the posterior left MCA branches, some of which may be  due to collaterals. The right M1 and MCA branches are perfused to their distal aspects without significant stenosis. Posterior circulation: Severe stenosis in the left V4 (series 16, images 453-470), proximal to the PICA takeoff. The distal left V4  is patent without significant stenosis. The right V4 is patent to the vertebrobasilar junction without significant stenosis. Posteroinferior cerebellar arteries are patent bilaterally. Basilar patent to its distal aspect without significant stenosis. Superior cerebellar arteries patent proximally. Patent P1 segments. PCAs perfused to their distal aspects without significant stenosis. The bilateral posterior communicating arteries are not visualized. Venous sinuses: As permitted by contrast timing, patent. Anatomic variants: None significant. Review of the MIP images confirms the above findings IMPRESSION: 1. Evolving infarcts in the left basal ganglia, left frontal and temporal lobes, and right posterior limb of the internal capsule, with petechial hemorrhage in the left basal ganglia, as seen on the 06/19/2023 MRI. Mass effect on the left lateral ventricle with 3 mm of left-to-right midline shift. 2. Severe, near occlusive stenosis in the left M1, with poor perfusion of the proximal M2 branches, and slightly better perfusion in the posterior left MCA branches, some of which may be due to collaterals. 3. Severe stenosis in the left V4, proximal to the PICA takeoff. 4. Severe stenosis in the left supraclinoid ICA, moderate stenosis in the right supraclinoid ICA, and mild stenosis in the left cavernous ICA. 5. Multifocal irregularity and mild-to-moderate stenosis in the left V1 and V2 segments, and mild stenosis in the right V1 and proximal V2 segments. 6. No other hemodynamically significant stenosis in the neck. 7. Heterogeneous opacities in the dependent right upper lobe, which appears increased compared to the 06/15/2023 CT chest and could represent aspiration or  infection. Electronically Signed   By: Wiliam Ke M.D.   On: 06/21/2023 18:51     LOS: 9 days   Lanae Boast, MD [facility]  06/23/2023, 1:34 PM

## 2023-06-23 NOTE — Progress Notes (Signed)
STROKE TEAM PROGRESS NOTE   INTERVAL HISTORY Grandma is at the bedside. Pt seems more interactive today and able to follow some simple commands. However, still has intermittent cough due to difficulty with secretions.   Vitals:   06/23/23 0353 06/23/23 0425 06/23/23 0729 06/23/23 1114  BP: (!) 174/94  (!) 175/89 (!) 169/100  Pulse: 86  82 88  Resp:   19 17  Temp: 98.1 F (36.7 C)  97.8 F (36.6 C) 98.3 F (36.8 C)  TempSrc: Oral  Oral Oral  SpO2: 99%  99% 99%  Weight:  87.9 kg    Height:       CBC:  Recent Labs  Lab 06/21/23 0743  WBC 9.8  HGB 9.2*  HCT 27.9*  MCV 88.9  PLT 252   Basic Metabolic Panel:  Recent Labs  Lab 06/22/23 0713 06/22/23 1631 06/23/23 0628  NA 143  --  143  K 3.6  --  3.4*  CL 112*  --  109  CO2 23  --  24  GLUCOSE 136*  --  236*  BUN 7  --  9  CREATININE 1.27*  --  1.29*  CALCIUM 8.6*  --  8.6*  MG 1.8 1.8 1.7  PHOS 2.7 2.9 3.8    IMAGING past 24 hours No results found.  PHYSICAL EXAM General: Patient with global aphasia resting in bed with eyes open in no acute distress Respiratory: Regular, unlabored respirations on room air Neurologic: awake, alert, eyes open, expressive aphasia, but seems able to follow eye open/close and able to show two fingers and making fist on the left hand on request. Left gaze preference and barely cross midline, blinking to visual threat on the left but not on the right, PERRL. Right mild facial droop. Tongue midline. LUE and LLE 5/5. LUE flaccid. LLE withdraw to pain. Sensation, coordination not cooperative and gait not tested. Intermittent coughing due to difficulty managing secretions.      ASSESSMENT/PLAN Mr. KHIREE NEWMAN is a 42 y.o. male with history of diabetes, hypertension, hyperlipidemia and smoking who was initially admitted to [facility] on 6/19 with right hemiplegia and aphasia.  He presented outside the window for TNK and thrombectomy.  He was found to have a left basal ganglia infarct,  which is now extended into much of the left MCA territory, and he also developed a small acute infarct in posterior limb of right internal capsule.  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  Yesterday, he was transferred here to be seen by stroke team and possibly for conventional angiogram given severe stenosis.  Admission urine toxicology was positive for THC.  Patient's grandmother states that he does not often go to the doctor and does smoke cigarettes.  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  Similar petechial hemorrhage.  New interval small acute infarct in the right PLIC CTA head and neck severe, near occlusive stenosis in left M1 with poor perfusion of proximal M2 branches, severe stenosis in left V4, severe stenosis in left supraclinoid ICA, moderate stenosis  in right supraclinoid ICA, multifocal irregularity and mild to moderate stenosis in left V1 and V2 segments, mild stenosis in right V1 and proximal V2 segments Carotid Doppler no significant plaque formation 2D Echo EF 60 to 65%  LDL 265 HgbA1c 11.7 VTE prophylaxis -Lovenox No antithrombotic prior to admission, now on aspirin 81 mg daily and clopidogrel 75 mg daily for at least 3 months due to severe M1 stenosis. Therapy recommendations: CIR Disposition: Pending  ICA and MCA stenosis CT head and neck severe, near occlusive stenosis in left M1 with poor perfusion of proximal M2 branches, severe stenosis in left supraclinoid ICA, moderate  stenosis in right supraclinoid ICA Plan for angiogram with intent of stenting next Tuesday given petechial hemorrhage  Hypertension Home meds: None Stable Outside of permissive hypertension window On amlodipine 5 BP goal 140-180 before the angiogram next Tuesday  Long-term BP goal normotensive  Hyperlipidemia Home meds:  Atorvastatin 10 mg daily  LDL 265, goal < 70 Lipitor increased to 80 Continue statin at discharge  Diabetes type II Uncontrolled Home meds: Lantus 30 units daily HgbA1c 11.7, goal < 7.0 On insulin  CBGs SSI Close PCP follow-up for better DM control  Tobacco abuse Current smoker Smoking cessation counseling will be provided On nicotine patch  AKI Creatinine 1.57-1.45-1.57->1.27->1.29 On IV fluid and TF  Dysphagia Did not pass a swallow Currently n.p.o. Core track placed On tube feeding Speech on board  Other Stroke Risk Factors Substance abuse - UDS:  THC POSITIVE. Patient will be advised to stop using due to stroke risk.  Other Active Problems Hypokalemia - K 3.4  Hospital day # 9   Marvel Plan, MD PhD Stroke Neurology 06/23/2023 2:31 PM      To contact Stroke Continuity provider, please refer to [email or web address]. After hours, contact General Neurology

## 2023-06-23 NOTE — TOC Progression Note (Signed)
Transition of Care [facility]) - Progression Note    Patient Details  Name: Zachary Winters MRN: [id number] Date of Birth: 04-27-1981  Transition of Care [facility]) CM/SW Contact  Kermit Balo, RN Phone Number: 06/23/2023, 1:06 PM  Clinical Narrative:    Noted plans for stent next Tuesday. CIR following and will restart insurance auth for a rehab admit.  TOC following.   Expected Discharge Plan: IP Rehab Facility Barriers to Discharge: Continued Medical Work up  Expected Discharge Plan and Services                                               Social Determinants of Health (SDOH) Interventions SDOH Screenings   Food Insecurity: No Food Insecurity (10/31/2019)  Transportation Needs: No Transportation Needs (10/31/2019)  Depression (PHQ2-9): Medium Risk (01/21/2021)  Financial Resource Strain: Low Risk  (10/31/2019)  Physical Activity: Sufficiently Active (10/31/2019)  Social Connections: Socially Isolated (10/31/2019)  Stress: No Stress Concern Present (10/31/2019)  Tobacco Use: High Risk (06/20/2023)    Readmission Risk Interventions     No data to display

## 2023-06-23 NOTE — Progress Notes (Signed)
Speech Language Pathology Treatment: Dysphagia;Cognitive-Linquistic  Patient Details Name: Zachary Winters MRN: [id number] DOB: Jan 30, 1981 Today's Date: 06/23/2023 Time: 4098-1191 SLP Time Calculation (min) (ACUTE ONLY): 32 min  Assessment / Plan / Recommendation Clinical Impression  SWALLOWING Pt seen for ongoing dysphagia management.  RN providing thorough oral care on SLP arrival.  Today pt tolerate puree and HTL with no clinical s/s of aspiration.  There was decreased oral transit with moderate oral residue, especially with puree.  Pt benefited from cues for secondary swallow which decreased, but did not fully eliminate residuals.  Pt noted to cough on secretions later in session and expectorated a moderate amount of clear secretions.  There did not appear to be any content from bolus trials in secretions.  Recommend MBSS for further evaluation of PO trials to determine if pt is able to initiation oral diet. Will attempt to schedule for later this date v next weekend depending on SLP and rad availability.  COMMUNICATION Pt presents with expressive > receptive deficit.  And is showing improvement from prior session 6/26.  Pt answered Y/N questions with 80% accuracy, including 75% accuracy on comparison questions.  Pt had difficulty following commands, especially for upper extremity use (?apraxia), but followed 2 step commands for body movement with 67% accuracy.  Pt correctly identified objects from Fo2 with 100% accuracy following initial training trial. With expressive language tasks pt was unable to participate in rote speech task in tandem.  With choral singing, pt did not join in, but afterward showed some communicative intent.  Pt vocalized independently.  This initial message was incomprehensible.  Pt and grandmother in room were very moved by pt communication attempt.  Grandmother said she just wanted to hear him say "granny."  Pt was agreeable to try.  He spoke what sounded like "I love you  granny" very dysarthric with largely only vowel sound present, but definite attempt at "gr" cluster was heard and he confirmed message when asked. Pt's successful communication attempt was celebrated.  Pt would continue to benefit from ST while in house and at next level of care.  Pt appears to be a good candidate for IPR.   HPI HPI: Zachary Winters is a 42 year old gentleman who presented to [facility] ED by EMS with right-sided weakness right-sided facial droop and difficulty speaking.  He was last known to be well 2 days PTA at 6 AM when his sister, with whom he lives, noticed he went out to smoke on ring camera. CT of the head with findings of moderate-sized subacute infarct in the left basal ganglia/corona radiata with probable petechial hemorrhage. Repeat MRI 06/19/23 shows extension of stroke and other involvement (see below). MBS 6/24 with recs for NPO, now with cortrak  Pt with hx poorly controlled diabetes mellitus on insulin, chronic daily smoker, hypertension, history of marijuana use, diabetic peripheral neuropathy, hyperlipidemia      SLP Plan  MBS;Continue with current plan of care      Recommendations for follow up therapy are one component of a multi-disciplinary discharge planning process, led by the attending physician.  Recommendations may be updated based on patient status, additional functional criteria and insurance authorization.    Recommendations  Diet recommendations: NPO Medication Administration: Via alternative means                  Oral care QID   Frequent or constant Supervision/Assistance Dysphagia, oropharyngeal phase (R13.12);Aphasia (R47.01)     MBS;Continue with current plan of care  [name]  06/23/2023, 11:16 AM

## 2023-06-24 DIAGNOSIS — I639 Cerebral infarction, unspecified: Secondary | ICD-10-CM | POA: Diagnosis not present

## 2023-06-24 LAB — BASIC METABOLIC PANEL
Anion gap: 8 (ref 5–15)
BUN: 9 mg/dL (ref 6–20)
CO2: 25 mmol/L (ref 22–32)
Calcium: 8.5 mg/dL — ABNORMAL LOW (ref 8.9–10.3)
Chloride: 110 mmol/L (ref 98–111)
Creatinine, Ser: 1.26 mg/dL — ABNORMAL HIGH (ref 0.61–1.24)
GFR, Estimated: >60 mL/min (ref >60)
Glucose, Bld: 199 mg/dL — ABNORMAL HIGH (ref 70–99)
Potassium: 3.6 mmol/L (ref 3.5–5.1)
Sodium: 143 mmol/L (ref 135–145)

## 2023-06-24 LAB — GLUCOSE, CAPILLARY
Glucose-Capillary: 189 mg/dL — ABNORMAL HIGH (ref 70–99)
Glucose-Capillary: 234 mg/dL — ABNORMAL HIGH (ref 70–99)
Glucose-Capillary: 243 mg/dL — ABNORMAL HIGH (ref 70–99)
Glucose-Capillary: 286 mg/dL — ABNORMAL HIGH (ref 70–99)

## 2023-06-24 MED ORDER — INSULIN GLARGINE-YFGN 100 UNIT/ML ~~LOC~~ SOLN
10.0000 [IU] | Freq: Every day | SUBCUTANEOUS | Status: DC
  Administered 2023-06-24: 10 [IU] via SUBCUTANEOUS
  Filled 2023-06-24 (×2): qty 0.1

## 2023-06-24 NOTE — Progress Notes (Signed)
STROKE TEAM PROGRESS NOTE   INTERVAL HISTORY [name] with his nurse at the bedside.  He has been hemodynamically stable overnight, and his neurological exam remains stable.  Remains globally aphasic and can follow only occasional midline and left-sided commands.  Remains with dense right hemiplegia. Vitals:   06/24/23 0430 06/24/23 0443 06/24/23 0820 06/24/23 1232  BP: (!) 163/91  (!) 176/97 (!) 165/94  Pulse: 80  99 89  Resp: 18  18   Temp:   99.3 F (37.4 C) 99.3 F (37.4 C)  TempSrc:   Oral Oral  SpO2: 99%  99% 98%  Weight:  89.4 kg    Height:       CBC:  Recent Labs  Lab 06/21/23 0743  WBC 9.8  HGB 9.2*  HCT 27.9*  MCV 88.9  PLT 252   Basic Metabolic Panel:  Recent Labs  Lab 06/22/23 1631 06/23/23 0628 06/24/23 0358  NA  --  143 143  K  --  3.4* 3.6  CL  --  109 110  CO2  --  24 25  GLUCOSE  --  236* 199*  BUN  --  9 9  CREATININE  --  1.29* 1.26*  CALCIUM  --  8.6* 8.5*  MG 1.8 1.7  --   PHOS 2.9 3.8  --    Lipid Panel: No results for input(s): "CHOL", "TRIG", "HDL", "CHOLHDL", "VLDL", "LDLCALC" in the last 168 hours. HgbA1c: No results for input(s): "HGBA1C" in the last 168 hours. Urine Drug Screen:  No results for input(s): "LABOPIA", "COCAINSCRNUR", "LABBENZ", "AMPHETMU", "THCU", "LABBARB" in the last 168 hours.  Alcohol Level No results for input(s): "ETH" in the last 168 hours.  IMAGING past 24 hours DG Swallowing Func-Speech Pathology  Result Date: 06/23/2023 Table formatting from the original result was not included. Modified Barium Swallow Study Patient Details Name: REINHARD SIRAK MRN: [id number] Date of Birth: 09-03-1981 Today's Date: 06/23/2023 HPI/PMH: HPI: Pt is a 42 year old male who presented with right-sided weakness, right-sided facial droop and difficulty speaking. 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 BSE 6/19 with minimal R  anterior spillage, decreased coordination and R anterior spillage; dysphagia 2 diet with thin liquids recommended. 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 Similar petechial hemorrhage without mass occupying acute hemorrhage. New/interval small acute infarct in the right posterior limb of the internal capsule. MBS 6/24: mod/severe oral phase dysphagia and mild/mod pharyngeal phase dysphagia characterized by weak lingual movement and bolus manipulation with labial spillage, oral holding, and spillover to the pharynx. An NPO status recommended with snacks of puree and honey thick liquids. PMH: diabetes mellitus on insulin, chronic daily smoker, hypertension, history of marijuana use, diabetic peripheral neuropathy, hyperlipidemia Clinical Impression: Clinical Impression: Pt presents with oropharyngeal dysphagia characterized by characterized by weak bolus manipulation, a pharyngeal delay and reduction in labial seal, bolus cohesion, tongue base retraction, and anterior laryngeal movement. Pt demonstrated right-sided anterior spillage across liquid consistenties, impaired A-P transport, inadequate lingual movement for mastication (bolus ultimately removed), and premature spillage to the pyriform sinuses. Pt intermittently used a posterior head tilt to facilitate A-P transport, but moderate oral residue remained in the lateral sulci and floor of the mouth. Penetration (PAS 3) and aspiration (PAS 7) were  inconsistently noted nectar thick liquids via cup secondary to premature spillage combined with the pharyngeal delay. Pt's violent cough projected oral residue across the fluoro suite and propelled some aspirated material superiorly, but was ineffective in expelling all of the aspirated material. No instances of laryngeal invasion were observed with  thin liquids, but this is likely since significant anterior spillage and oral residue resulted in pt only swallowing very small boluses of thin liquids. Pt's overall swallow function is improved with aspiration now triggering a violent cough, pt more consistently swallowing, and having improved bolus manipulation but pt's dysphagia remains significant. A dysphagia 1 diet with honey thick liquids is recommended at this time and SLP will continue to follow for treatment. Factors that may increase risk of adverse event in presence of aspiration [name] & [name] 2021): Factors that may increase risk of adverse event in presence of aspiration [name] & [name] 2021): Limited mobility; Dependence for feeding and/or oral hygiene; Inadequate oral hygiene Recommendations/Plan: Swallowing Evaluation Recommendations Swallowing Evaluation Recommendations Recommendations: PO diet PO Diet Recommendation: Dysphagia 1 (Pureed); Moderately thick liquids (Level 3, honey thick) Liquid Administration via: Spoon; Cup Medication Administration: Crushed with puree (or via cortrak) Supervision: Full supervision/cueing for swallowing strategies; Staff to assist with self-feeding Swallowing strategies  : Slow rate; Small bites/sips; Check for pocketing or oral holding; Check for anterior loss; Multiple dry swallows after each bite/sip Postural changes: Position pt fully upright for meals; Stay upright 30-60 min after meals Oral care recommendations: Staff/trained caregiver to provide oral care; Oral care before ice chips/water Caregiver Recommendations: Have oral suction available Treatment Plan Treatment Plan Treatment recommendations: Therapy as outlined in treatment plan below Follow-up recommendations: Acute inpatient rehab (3 hours/day) Functional status assessment: Patient has had a recent decline in their functional status and demonstrates the ability to make significant improvements in function in a reasonable and predictable amount  of time. Treatment frequency: Min 2x/week Treatment duration: 2 weeks Interventions: Aspiration precaution training; Oropharyngeal exercises; Patient/family education; Trials of upgraded texture/liquids Recommendations Recommendations for follow up therapy are one component of a multi-disciplinary discharge planning process, led by the attending physician.  Recommendations may be updated based on patient status, additional functional criteria and insurance authorization. Assessment: Orofacial Exam: Orofacial Exam Oral Cavity - Dentition: Adequate natural dentition Anatomy: Anatomy: Suspected cervical osteophytes Boluses Administered: Boluses Administered Boluses Administered: Thin liquids (Level 0); Mildly thick liquids (Level 2, nectar thick); Moderately thick liquids (Level 3, honey thick); Puree; Solid  Oral Impairment Domain: Oral Impairment Domain Lip Closure: Escape beyond mid-chin Tongue control during bolus hold: Posterior escape of greater than half of bolus Bolus preparation/mastication: Minimal chewing/mashing with majority of bolus unchewed Bolus transport/lingual motion: Repetitive/disorganized tongue motion Oral residue: Residue collection on oral structures Location of oral residue : Floor of mouth; Tongue Initiation of pharyngeal swallow : Pyriform sinuses  Pharyngeal Impairment Domain: Pharyngeal Impairment Domain Soft palate elevation: No bolus between soft palate (SP)/pharyngeal wall (PW) Laryngeal elevation: Complete superior movement of thyroid cartilage with complete approximation of arytenoids to epiglottic petiole Anterior hyoid excursion: Partial anterior movement Epiglottic movement: Complete inversion Laryngeal vestibule closure: Incomplete, narrow column air/contrast in laryngeal vestibule Pharyngeal stripping wave : Present - diminished Pharyngeal contraction (A/P view only): N/A Pharyngoesophageal segment opening: Complete distension and complete duration, no obstruction of flow Tongue  base retraction: Narrow column of contrast or air between tongue base and PPW Pharyngeal residue: Collection of residue within or on pharyngeal structures Location of pharyngeal residue: Valleculae; Pyriform sinuses  Esophageal Impairment Domain: Esophageal  Impairment Domain Esophageal clearance upright position: Complete clearance, esophageal coating Pill: Pill Consistency administered: -- (NT) Penetration/Aspiration Scale Score: Penetration/Aspiration Scale Score 1.  Material does not enter airway: Puree; Moderately thick liquids (Level 3, honey thick) 3.  Material enters airway, remains ABOVE vocal cords and not ejected out: Mildly thick liquids (Level 2, nectar thick) 7.  Material enters airway, passes BELOW cords and not ejected out despite cough attempt by patient: Mildly thick liquids (Level 2, nectar thick) Compensatory Strategies: Compensatory Strategies Compensatory strategies: No   General Information: Caregiver present: No  Diet Prior to this Study: NPO   Temperature : Normal   Respiratory Status: WFL   Supplemental O2: None (Room air)   History of Recent Intubation: No  Behavior/Cognition: Alert; Pleasant mood; Requires cueing; Impulsive Self-Feeding Abilities: Needs assist with self-feeding Baseline vocal quality/speech: Not observed Volitional Cough: Unable to elicit Volitional Swallow: Unable to elicit Exam Limitations: No limitations (impusivity) Goal Planning: Prognosis for improved oropharyngeal function: Fair Barriers to Reach Goals: Language deficits; Severity of deficits No data recorded Patient/Family Stated Goal: For pt to say "granny" - MET! Consulted and agree with results and recommendations: Nurse; Pt unable/family or caregiver not available Pain: Pain Assessment Pain Assessment: Faces Faces Pain Scale: 4 Breathing: 0 Negative Vocalization: 0 Facial Expression: 0 Body Language: 0 Consolability: 0 PAINAD Score: 0 Pain Location: grimacing with R UE ROM and activity in general Pain  Descriptors / Indicators: Discomfort; Grimacing Pain Intervention(s): Limited activity within patient's tolerance; Monitored during session End of Session: Start Time:SLP Start Time (ACUTE ONLY): 1255 Stop Time: SLP Stop Time (ACUTE ONLY): 1315 Time Calculation:SLP Time Calculation (min) (ACUTE ONLY): 20 min Charges: SLP Evaluations $ SLP Speech Visit: 1 Visit SLP Evaluations $MBS Swallow: 1 Procedure $Swallowing Treatment: 1 Procedure $Speech Treatment for Individual: 1 Procedure SLP visit diagnosis: SLP Visit Diagnosis: Dysphagia, oropharyngeal phase (R13.12) Past Medical History: Past Medical History: Diagnosis Date  Diabetes mellitus without complication (HCC)  Past Surgical History: Past Surgical History: Procedure Laterality Date  TEE WITHOUT CARDIOVERSION N/A 06/16/2023  Procedure: TRANSESOPHAGEAL ECHOCARDIOGRAM (TEE);  Surgeon: Jonelle Sidle, MD;  Location: [facility];  Service: Cardiovascular;  Laterality: N/A; Yvone Neu I. Vear Clock, MS, CCC-SLP Neuro Diagnostic Specialist Acute Rehabilitation Services Office number: [phone] Scheryl Marten 06/23/2023, 3:06 PM   PHYSICAL EXAM General: Patient with global aphasia resting in bed with eyes open in no acute distress Respiratory: Regular, unlabored respirations on room air Neurologic: Patient with global aphasia does not respond to name but intermittently follows commands.  He blinks to threat on the left but not on the right and has a left gaze preference but can cross midline.  Right facial droop noted, moves left upper and lower extremities spontaneously and will withdraw to right upper and lower extremities to noxious stimuli.  ASSESSMENT/PLAN Mr. NKOSI KULZER is a 42 y.o. male with history of diabetes, hypertension, hyperlipidemia and smoking who was initially admitted to [facility] on 6/19 with right hemiplegia and aphasia.  He presented outside the window for TNK and thrombectomy.  He was found to have a left basal ganglia infarct, which  is now extended into much of the left MCA territory, and he also developed a small acute infarct in posterior limb of right internal capsule.  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  Yesterday, he was transferred here to be seen by stroke team and possibly for conventional angiogram given severe stenosis.  Admission urine toxicology was positive for THC.  Patient's grandmother states that  he does not often go to the doctor and does smoke cigarettes.  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  Similar petechial hemorrhage.  New interval small acute infarct in the right PLIC CTA head and neck severe, near occlusive stenosis in left M1 with poor perfusion of proximal M2 branches, severe stenosis in left V4, severe stenosis in left supraclinoid ICA, moderate stenosis in right supraclinoid ICA, multifocal irregularity and mild to moderate stenosis in left V1 and V2 segments, mild stenosis in right V1 and proximal V2 segments Carotid Doppler no significant plaque formation 2D Echo EF 60 to 65%  LDL 265 HgbA1c 11.7 VTE prophylaxis -Lovenox No antithrombotic prior to admission, now on aspirin 81 mg daily and clopidogrel 75 mg daily for at least 3 months due to severe M1 stenosis. Therapy recommendations: CIR Disposition: Pending  ICA and MCA stenosis CT head and neck severe, near occlusive stenosis in left M1 with poor perfusion of proximal M2 branches, severe stenosis in left supraclinoid ICA, moderate  stenosis in right supraclinoid ICA Plan for angiogram with intent of stenting next Tuesday given petechial hemorrhage  Hypertension Home meds: None Stable Outside of permissive hypertension window On amlodipine 5 BP goal 140-180 before the angiogram next Tuesday  Long-term BP goal normotensive  Hyperlipidemia Home meds:  Atorvastatin 10 mg daily  LDL 265, goal < 70 Lipitor increased to 80 Continue statin at discharge  Diabetes type II Uncontrolled Home meds: Lantus 30 units daily HgbA1c 11.7, goal < 7.0 CBGs SSI Close PCP follow-up for better DM control  Tobacco abuse Current smoker Smoking cessation counseling will be provided  AKI Creatinine 1.57-1.45-1.57->1.27 On IV fluid and TF CVP monitoring  Dysphagia Did not pass a swallow Currently n.p.o. Core track placed On tube feeding Speech on board  Other Stroke Risk Factors Substance abuse - UDS:  THC POSITIVE. Patient will be advised to stop using due to stroke risk.  Other Active Problems None  Hospital day # 10 Patient continues to have significant global aphasia with dense right hemiplegia.  Continue permissive hypertension and dual antiplatelet therapy.  Continue physical occupational and speech therapy.  No family at the bedside.  Greater than 50% time during this 50-minute visit was spent in counseling and coordination of care and discussion with patient and care team and answering questions.   Delia Heady, MD  Stroke Neurology 06/24/2023 12:39 PM      To contact Stroke Continuity provider, please refer to [email or web address]. After hours, contact General Neurology

## 2023-06-24 NOTE — Plan of Care (Signed)
  Problem: Clinical Measurements: Goal: Diagnostic test results will improve Outcome: Progressing Goal: Respiratory complications will improve Outcome: Progressing Goal: Cardiovascular complication will be avoided Outcome: Progressing   Problem: Activity: Goal: Risk for activity intolerance will decrease Outcome: Progressing   Problem: Nutrition: Goal: Adequate nutrition will be maintained Outcome: Progressing   Problem: Coping: Goal: Level of anxiety will decrease Outcome: Progressing   

## 2023-06-24 NOTE — Progress Notes (Signed)
PROGRESS NOTE Zachary Winters  WUJ:[id number] DOB: [birth year] DOA: 06/14/2023 PCP: System, Provider Not In  Brief Narrative/Hospital Course: 42 year old gentleman with poorly controlled diabetes mellitus on insulin, chronic daily smoker, hypertension, history of marijuana use, diabetic peripheral neuropathy, hyperlipidemia was sent to the emergency department by [facility] with right-sided weakness right-sided facial droop and difficulty speaking.  He was last known to be well 2 days ago at 6 AM when his sister noticed he went out to smoke on ring camera.  He apparently lives with his sister.  Sister noticed the patient lying on the floor having a difficult time communicating only able to nod yes and no.  She noticed that for the last 2 days he has been lying around and sleeping. In ED: had significant aphasia and right-sided hemiparesis.  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  His blood glucose was 109.  Code Stroke was not activated due to being 48 hours since last known well time.  06/14/23:Admitted at [facility]> MRI brain:acute to subacute perforator infarct at the left basal ganglia and adjacent white matter with petechial hemorrhage. 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 Subsequently underwent TTE, TEE that was negative for hyper coagulable labs ordered seen by neuro PT OT speech, CT chest abdomen pelvis unremarkable and occult malignancy. 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 New/interval small acute infarct in the right posterior limb of the internal capsule> neurology was notified advised to transfer to [facility] for further neurology  management. 06/20/23 night patient arrived at [facility] 06/21/23;Cortrak ordered  Subjective: Seen and examined this morning No family at bedside Remains aphasic able to move his left side but not the right side Overnight afebrile, BP stabilizing  Labs showed stable creatinine 1.2  Assessment and Plan: Principal Problem:   Acute stroke due to ischemia [facility]) Active Problems:   Hypertension associated with diabetes (HCC)   Type 2 diabetes mellitus with diabetic neuropathy (HCC)   Hyperlipidemia associated with type 2 diabetes mellitus (HCC)   Dysphonia   Aphasia   Tobacco use disorder   Recreational drug use   Diabetic neuropathy (HCC)   Vitamin D deficiency   Apraxia  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 Plan for angiogram with intent of stenting next Tuesday given petechial hemorrhage. Continue Lipitor 80 mg,aspirin 81 Plavix 75 x 3 months for now per neurology Has global aphasia>remains to be seen home how much functional recovery he will make-discussed about risk of aspiration, possible need for long-term feeding if remains dysphagic.  Continue oral suctioning as he has been having cough.  Dysphagia: Continue core track tube feeding , speech eval appreciated s/p MBS 6/28> placed on dysphagia 1 diet .  Hypertension: BP not fairly controlled continue amlodipine 10 mg Coreg 6.25 twice daily, monitor and slowly adjust medication for BP goal 140-180 before angiogram next Tuesday after which normotensive.  Hyperlipidemia: LDL 265 started on Atorvastatin 80 mg  T2DM  w/ uncontrolled hyperglycemia A1c 9.7, with diabetic peripheral  neuropathy: Appears to be noncompliant, blood sugar improving slowly increase Semglee  to 10 u, cont ssi Recent Labs  Lab 06/23/23 0750 06/23/23 1118 06/23/23 1619 06/23/23 2021 06/24/23 0856  GLUCAP 181* 222* 199* 229* 189*   AKI vs CKD 3a Metabolic acidosis: Creatinine previously 1.0 in 2022 on admission 1.5, creatinine improved to 1.2 with ongoing IV fluid hydration, metabolic acidosis improved.  Cont ivf. Recent Labs    06/14/23 0911 06/15/23 0514 06/16/23 0416 06/21/23 0743 06/22/23 0713 06/23/23 0628 06/24/23 0358  BUN 19 16 14 9 7 9 9   CREATININE 1.57* 1.49* 1.45* 1.57* 1.27* 1.29* 1.26*  CO2 27 25 24  19* 23 24 25     Anemia hemoglobin slowly downtrending monitor no obvious bleeding noted, likely dilutional Recent Labs  Lab 06/21/23 0743  HGB 9.2*  HCT 27.9*   Hypokalemia resolved  Recent Labs  Lab 06/21/23 0743 06/22/23 0713 06/23/23 0628 06/24/23 0358  K 3.6 3.6 3.4* 3.6   Severe vitamin D deficiency: Unable to use weekly high-dose vitamin D replacement w/ NGT.  Continue vitamin D liquid daily until able to take p.o.  Tobacco abuse: will need to stop smoking.  Continue scheduled nicotine patch  Recreational drug use UDS positive for marijuana: TOC consult for resources  Deconditioning debility Continue aggressive PT OT speech eval. use restraint on the left side to protect core track tube.  Right upper lobe predominant tree-in-bud pattern suggesting chronic inflammation atypical infection such as MAC: Will refer him out to pulmonary clinic at discharge for further evaluation of this.  At risk of aspiration continue suctioning  CT noted advanced coronary calcifications:Will refer him out to cardiology at discharge for surveillance.   DVT prophylaxis: enoxaparin (LOVENOX) injection 40 mg Start: 06/14/23 1045 Code Status:   Code Status: Full Code Family Communication: plan of care discussed with patient/mother at bedside. Patient status is:   inpatient because of acute stroke Level of care: Telemetry Medical   Dispo: The patient is from: Home            Anticipated disposition: CIR Objective: Vitals last 24 hrs: Vitals:   06/23/23 2335 06/24/23 0430 06/24/23 0443 06/24/23 0820  BP: (!) 164/94 (!) 163/91  (!) 176/97  Pulse: 80 80  99  Resp: 18 18  18   Temp: 98.7 F (37.1 C)   99.3 F (37.4 C)  TempSrc: Oral   Oral  SpO2: 99% 99%  99%  Weight:   89.4 kg   Height:       Weight change: 1.5 kg Physical exam General exam: alert awake, aphasic HEENT:Oral mucosa moist, Ear/Nose WNL grossly Respiratory system: Bilaterally clear BS,no use of accessory muscle Cardiovascular system: S1 & S2 +, No JVD. Gastrointestinal system: Abdomen soft,NT,ND, BS+,cortrak+ Nervous System: Alert, awake, moving left UE/LE well but not able ot move RUE/RLE Extremities: LE edema neg,distal peripheral pulses palpable and warm.  Skin: No rashes,no icterus. MSK: Normal muscle bulk,tone, power   Medications reviewed:  Scheduled Meds:  amLODipine  10 mg Per NG tube Daily   aspirin  81 mg Per Tube Daily   atorvastatin  80 mg Per Tube QPM   carvedilol  6.25 mg Oral BID WC   cholecalciferol  400 Units Per Tube Daily   clopidogrel  75 mg Per NG tube Daily   enoxaparin (LOVENOX) injection  40 mg Subcutaneous Daily   insulin aspart  0-9  Units Subcutaneous Q6H   insulin glargine-yfgn  10 Units Subcutaneous Daily   nicotine  21 mg Transdermal Daily  Continuous Infusions:  sodium chloride 50 mL/hr at 06/24/23 1033   feeding supplement (GLUCERNA 1.5 CAL) 1,000 mL (06/24/23 0645)    Diet Order             DIET - DYS 1 Room service appropriate? No; Fluid consistency: Honey Thick  Diet effective now                 Nutrition Problem: Inadequate oral intake Etiology: dysphagia Signs/Symptoms: NPO status Interventions: Refer to RD note for recommendations   Intake/Output Summary (Last 24 hours) at 06/24/2023 1119 Last data filed at 06/24/2023  0146 Gross per 24 hour  Intake 2561.67 ml  Output 800 ml  Net 1761.67 ml   Net IO Since Admission: 1,967.79 mL [06/24/23 1119]  Wt Readings from Last 3 Encounters:  06/24/23 89.4 kg  09/05/21 68 kg  07/31/21 65.3 kg     Unresulted Labs (From admission, onward)     Start     Ordered   06/22/23 0500  Basic metabolic panel  Daily,   R     Question:  Specimen collection method  Answer:  Lab=Lab collect   06/21/23 1310   06/21/23 0500  Creatinine, serum  (enoxaparin (LOVENOX)    CrCl >/= 30 ml/min)  Weekly,   R     Comments: while on enoxaparin therapy    06/14/23 1038   06/17/23 0500  Factor 5 leiden  (Hypercoagulable Panel, Comprehensive ([facility]))  Tomorrow morning,   R        06/16/23 1253   06/17/23 0500  Prothrombin gene mutation  (Hypercoagulable Panel, Comprehensive ([facility]))  Tomorrow morning,   R        06/16/23 1253          Data Reviewed: I have personally reviewed following labs and imaging studies CBC: Recent Labs  Lab 06/21/23 0743  WBC 9.8  HGB 9.2*  HCT 27.9*  MCV 88.9  PLT 252   Basic Metabolic Panel: Recent Labs  Lab 06/21/23 0743 06/21/23 1721 06/22/23 0713 06/22/23 1631 06/23/23 0628 06/24/23 0358  NA 145  --  143  --  143 143  K 3.6  --  3.6  --  3.4* 3.6  CL 110  --  112*  --  109 110  CO2 19*  --  23  --  24 25  GLUCOSE 133*  --  136*  --  236* 199*  BUN 9  --  7  --  9 9  CREATININE 1.57*  --  1.27*  --  1.29* 1.26*  CALCIUM 8.5*  --  8.6*  --  8.6* 8.5*  MG  --  2.0 1.8 1.8 1.7  --   PHOS  --  2.8 2.7 2.9 3.8  --    Recent Labs  Lab 06/23/23 0750 06/23/23 1118 06/23/23 1619 06/23/23 2021 06/24/23 0856  GLUCAP 181* 222* 199* 229* 189*  No results found for this or any previous visit (from the past 240 hour(s)).  Antimicrobials: Anti-infectives (From admission, onward)    None      Culture/Microbiology    Component Value Date/Time   SDES  03/14/2021 1728    IN/OUT CATH URINE Performed at [facility], [address],  [place], [place] [address]    [facility] [place]  03/14/2021 1728    NONE Performed at [facility], [address]  [place], [place] [address]    CULT MULTIPLE SPECIES PRESENT, SUGGEST RECOLLECTION (A) 03/14/2021 1728   REPTSTATUS 03/16/2021 FINAL 03/14/2021 1728    Radiology Studies: DG Swallowing Func-Speech Pathology  Result Date: 06/23/2023 Table formatting from the original result was not included. Modified Barium Swallow Study Patient Details Name: Zachary Winters MRN: [id number] Date of Birth: 12-05-1981 Today's Date: 06/23/2023 HPI/PMH: HPI: Pt is a 42 year old male who presented with right-sided weakness, right-sided facial droop and difficulty speaking. 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 BSE 6/19 with minimal R anterior spillage, decreased coordination and R anterior spillage; dysphagia 2 diet with thin liquids recommended. 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 Similar petechial hemorrhage without mass occupying acute hemorrhage. New/interval small acute infarct in the right posterior limb of the internal capsule. MBS 6/24: mod/severe oral phase dysphagia and mild/mod pharyngeal phase dysphagia characterized by weak lingual movement and bolus manipulation with labial spillage, oral holding, and spillover to the pharynx. An NPO status recommended with snacks of puree and honey thick liquids. PMH: diabetes mellitus on insulin, chronic daily smoker, hypertension, history of marijuana use, diabetic peripheral neuropathy, hyperlipidemia Clinical Impression: Clinical Impression: Pt presents with oropharyngeal dysphagia characterized by characterized by weak bolus manipulation, a pharyngeal delay and reduction in labial seal, bolus  cohesion, tongue base retraction, and anterior laryngeal movement. Pt demonstrated right-sided anterior spillage across liquid consistenties, impaired A-P transport, inadequate lingual movement for mastication (bolus ultimately removed), and premature spillage to the pyriform sinuses. Pt intermittently used a posterior head tilt to facilitate A-P transport, but moderate oral residue remained in the lateral sulci and floor of the mouth. Penetration (PAS 3) and aspiration (PAS 7) were inconsistently noted nectar thick liquids via cup secondary to premature spillage combined with the pharyngeal delay. Pt's violent cough projected oral residue across the fluoro suite and propelled some aspirated material superiorly, but was ineffective in expelling all of the aspirated material. No instances of laryngeal invasion were observed with thin liquids, but this is likely since significant anterior spillage and oral residue resulted in pt only swallowing very small boluses of thin liquids. Pt's overall swallow function is improved with aspiration now triggering a violent cough, pt more consistently swallowing, and having improved bolus manipulation but pt's dysphagia remains significant. A dysphagia 1 diet with honey thick liquids is recommended at this time and SLP will continue to follow for treatment. Factors that may increase risk of adverse event in presence of aspiration [name] & [name] 2021): Factors that may increase risk of adverse event in presence of aspiration [name] & [name] 2021): Limited mobility; Dependence for feeding and/or oral hygiene; Inadequate oral hygiene Recommendations/Plan: Swallowing Evaluation Recommendations Swallowing Evaluation Recommendations Recommendations: PO diet PO Diet Recommendation: Dysphagia 1 (Pureed); Moderately thick liquids (Level 3, honey thick) Liquid Administration via: Spoon; Cup Medication Administration: Crushed with puree (or via cortrak) Supervision: Full  supervision/cueing for swallowing strategies; Staff to assist with self-feeding Swallowing strategies  : Slow rate; Small bites/sips; Check for pocketing or oral holding; Check for anterior loss; Multiple dry swallows after each bite/sip Postural changes: Position pt fully upright for meals; Stay upright 30-60 min after meals Oral care recommendations: Staff/trained caregiver to provide oral care; Oral care before ice chips/water Caregiver Recommendations: Have oral suction available Treatment  Plan Treatment Plan Treatment recommendations: Therapy as outlined in treatment plan below Follow-up recommendations: Acute inpatient rehab (3 hours/day) Functional status assessment: Patient has had a recent decline in their functional status and demonstrates the ability to make significant improvements in function in a reasonable and predictable amount of time. Treatment frequency: Min 2x/week Treatment duration: 2 weeks Interventions: Aspiration precaution training; Oropharyngeal exercises; Patient/family education; Trials of upgraded texture/liquids Recommendations Recommendations for follow up therapy are one component of a multi-disciplinary discharge planning process, led by the attending physician.  Recommendations may be updated based on patient status, additional functional criteria and insurance authorization. Assessment: Orofacial Exam: Orofacial Exam Oral Cavity - Dentition: Adequate natural dentition Anatomy: Anatomy: Suspected cervical osteophytes Boluses Administered: Boluses Administered Boluses Administered: Thin liquids (Level 0); Mildly thick liquids (Level 2, nectar thick); Moderately thick liquids (Level 3, honey thick); Puree; Solid  Oral Impairment Domain: Oral Impairment Domain Lip Closure: Escape beyond mid-chin Tongue control during bolus hold: Posterior escape of greater than half of bolus Bolus preparation/mastication: Minimal chewing/mashing with majority of bolus unchewed Bolus transport/lingual  motion: Repetitive/disorganized tongue motion Oral residue: Residue collection on oral structures Location of oral residue : Floor of mouth; Tongue Initiation of pharyngeal swallow : Pyriform sinuses  Pharyngeal Impairment Domain: Pharyngeal Impairment Domain Soft palate elevation: No bolus between soft palate (SP)/pharyngeal wall (PW) Laryngeal elevation: Complete superior movement of thyroid cartilage with complete approximation of arytenoids to epiglottic petiole Anterior hyoid excursion: Partial anterior movement Epiglottic movement: Complete inversion Laryngeal vestibule closure: Incomplete, narrow column air/contrast in laryngeal vestibule Pharyngeal stripping wave : Present - diminished Pharyngeal contraction (A/P view only): N/A Pharyngoesophageal segment opening: Complete distension and complete duration, no obstruction of flow Tongue base retraction: Narrow column of contrast or air between tongue base and PPW Pharyngeal residue: Collection of residue within or on pharyngeal structures Location of pharyngeal residue: Valleculae; Pyriform sinuses  Esophageal Impairment Domain: Esophageal Impairment Domain Esophageal clearance upright position: Complete clearance, esophageal coating Pill: Pill Consistency administered: -- (NT) Penetration/Aspiration Scale Score: Penetration/Aspiration Scale Score 1.  Material does not enter airway: Puree; Moderately thick liquids (Level 3, honey thick) 3.  Material enters airway, remains ABOVE vocal cords and not ejected out: Mildly thick liquids (Level 2, nectar thick) 7.  Material enters airway, passes BELOW cords and not ejected out despite cough attempt by patient: Mildly thick liquids (Level 2, nectar thick) Compensatory Strategies: Compensatory Strategies Compensatory strategies: No   General Information: Caregiver present: No  Diet Prior to this Study: NPO   Temperature : Normal   Respiratory Status: WFL   Supplemental O2: None (Room air)   History of Recent  Intubation: No  Behavior/Cognition: Alert; Pleasant mood; Requires cueing; Impulsive Self-Feeding Abilities: Needs assist with self-feeding Baseline vocal quality/speech: Not observed Volitional Cough: Unable to elicit Volitional Swallow: Unable to elicit Exam Limitations: No limitations (impusivity) Goal Planning: Prognosis for improved oropharyngeal function: Fair Barriers to Reach Goals: Language deficits; Severity of deficits No data recorded Patient/Family Stated Goal: For pt to say "granny" - MET! Consulted and agree with results and recommendations: Nurse; Pt unable/family or caregiver not available Pain: Pain Assessment Pain Assessment: Faces Faces Pain Scale: 4 Breathing: 0 Negative Vocalization: 0 Facial Expression: 0 Body Language: 0 Consolability: 0 PAINAD Score: 0 Pain Location: grimacing with R UE ROM and activity in general Pain Descriptors / Indicators: Discomfort; Grimacing Pain Intervention(s): Limited activity within patient's tolerance; Monitored during session End of Session: Start Time:SLP Start Time (ACUTE ONLY): 1255 Stop Time: SLP Stop Time (  ACUTE ONLY): 1315 Time Calculation:SLP Time Calculation (min) (ACUTE ONLY): 20 min Charges: SLP Evaluations $ SLP Speech Visit: 1 Visit SLP Evaluations $MBS Swallow: 1 Procedure $Swallowing Treatment: 1 Procedure $Speech Treatment for Individual: 1 Procedure SLP visit diagnosis: SLP Visit Diagnosis: Dysphagia, oropharyngeal phase (R13.12) Past Medical History: Past Medical History: Diagnosis Date  Diabetes mellitus without complication (HCC)  Past Surgical History: Past Surgical History: Procedure Laterality Date  TEE WITHOUT CARDIOVERSION N/A 06/16/2023  Procedure: TRANSESOPHAGEAL ECHOCARDIOGRAM (TEE);  Surgeon: Jonelle Sidle, MD;  Location: [facility];  Service: Cardiovascular;  Laterality: N/A; Yvone Neu I. Vear Clock, MS, CCC-SLP Neuro Diagnostic Specialist Acute Rehabilitation Services Office number: [phone] Scheryl Marten 06/23/2023, 3:06 PM     LOS: 10 days   Lanae Boast, MD [facility]  06/24/2023, 11:19 AM

## 2023-06-24 NOTE — Plan of Care (Signed)
  Problem: Education: Goal: Knowledge of disease or condition will improve Outcome: Progressing Goal: Knowledge of secondary prevention will improve (MUST DOCUMENT ALL) Outcome: Progressing Goal: Knowledge of patient specific risk factors will improve (Mark N/A or DELETE if not current risk factor) Outcome: Progressing   Problem: Ischemic Stroke/TIA Tissue Perfusion: Goal: Complications of ischemic stroke/TIA will be minimized Outcome: Progressing   Problem: Coping: Goal: Will verbalize positive feelings about self Outcome: Progressing Goal: Will identify appropriate support needs Outcome: Progressing   

## 2023-06-25 DIAGNOSIS — I639 Cerebral infarction, unspecified: Secondary | ICD-10-CM | POA: Diagnosis not present

## 2023-06-25 LAB — BASIC METABOLIC PANEL
Anion gap: 8 (ref 5–15)
BUN: 13 mg/dL (ref 6–20)
CO2: 27 mmol/L (ref 22–32)
Calcium: 8.6 mg/dL — ABNORMAL LOW (ref 8.9–10.3)
Chloride: 105 mmol/L (ref 98–111)
Creatinine, Ser: 1.37 mg/dL — ABNORMAL HIGH (ref 0.61–1.24)
GFR, Estimated: >60 mL/min (ref >60)
Glucose, Bld: 291 mg/dL — ABNORMAL HIGH (ref 70–99)
Potassium: 3.7 mmol/L (ref 3.5–5.1)
Sodium: 140 mmol/L (ref 135–145)

## 2023-06-25 LAB — GLUCOSE, CAPILLARY
Glucose-Capillary: 168 mg/dL — ABNORMAL HIGH (ref 70–99)
Glucose-Capillary: 179 mg/dL — ABNORMAL HIGH (ref 70–99)
Glucose-Capillary: 201 mg/dL — ABNORMAL HIGH (ref 70–99)
Glucose-Capillary: 243 mg/dL — ABNORMAL HIGH (ref 70–99)
Glucose-Capillary: 268 mg/dL — ABNORMAL HIGH (ref 70–99)
Glucose-Capillary: 291 mg/dL — ABNORMAL HIGH (ref 70–99)

## 2023-06-25 MED ORDER — INSULIN GLARGINE-YFGN 100 UNIT/ML ~~LOC~~ SOLN
10.0000 [IU] | Freq: Two times a day (BID) | SUBCUTANEOUS | Status: DC
  Administered 2023-06-25 – 2023-06-27 (×5): 10 [IU] via SUBCUTANEOUS
  Filled 2023-06-25 (×6): qty 0.1

## 2023-06-25 MED ORDER — INSULIN ASPART 100 UNIT/ML IJ SOLN
0.0000 [IU] | INTRAMUSCULAR | Status: DC
  Administered 2023-06-25 (×2): 5 [IU] via SUBCUTANEOUS
  Administered 2023-06-25: 2 [IU] via SUBCUTANEOUS
  Administered 2023-06-26: 5 [IU] via SUBCUTANEOUS
  Administered 2023-06-26: 3 [IU] via SUBCUTANEOUS
  Administered 2023-06-26: 1 [IU] via SUBCUTANEOUS
  Administered 2023-06-26: 3 [IU] via SUBCUTANEOUS
  Administered 2023-06-26: 7 [IU] via SUBCUTANEOUS
  Administered 2023-06-27: 1 [IU] via SUBCUTANEOUS
  Administered 2023-06-27: 5 [IU] via SUBCUTANEOUS
  Administered 2023-06-27: 3 [IU] via SUBCUTANEOUS

## 2023-06-25 NOTE — Progress Notes (Signed)
STROKE TEAM PROGRESS NOTE   INTERVAL HISTORY [name] with no one at the bedside.  He has been hemodynamically stable overnight, and his neurological exam remains stable.  Remains globally aphasic but can follow   midline and left-sided commands.  Remains with dense right hemiplegia.  Vital signs stable.  Vital signs stablevital signs stable Vitals:   06/25/23 0015 06/25/23 0535 06/25/23 0922 06/25/23 1249  BP: 101/69 (!) 156/87 (!) 149/97 (!) 155/95  Pulse:  86 79 88  Resp:  16 18 18   Temp: 99.4 F (37.4 C) 99.4 F (37.4 C) 98.2 F (36.8 C) 98.4 F (36.9 C)  TempSrc: Oral Oral Oral Oral  SpO2: 100% 97% 96% 100%  Weight:      Height:       CBC:  Recent Labs  Lab 06/21/23 0743  WBC 9.8  HGB 9.2*  HCT 27.9*  MCV 88.9  PLT 252   Basic Metabolic Panel:  Recent Labs  Lab 06/22/23 1631 06/23/23 0628 06/24/23 0358 06/25/23 0549  NA  --  143 143 140  K  --  3.4* 3.6 3.7  CL  --  109 110 105  CO2  --  24 25 27   GLUCOSE  --  236* 199* 291*  BUN  --  9 9 13   CREATININE  --  1.29* 1.26* 1.37*  CALCIUM  --  8.6* 8.5* 8.6*  MG 1.8 1.7  --   --   PHOS 2.9 3.8  --   --    Lipid Panel: No results for input(s): "CHOL", "TRIG", "HDL", "CHOLHDL", "VLDL", "LDLCALC" in the last 168 hours. HgbA1c: No results for input(s): "HGBA1C" in the last 168 hours. Urine Drug Screen:  No results for input(s): "LABOPIA", "COCAINSCRNUR", "LABBENZ", "AMPHETMU", "THCU", "LABBARB" in the last 168 hours.  Alcohol Level No results for input(s): "ETH" in the last 168 hours.  IMAGING past 24 hours No results found.  PHYSICAL EXAM General: Patient with global aphasia resting in bed with eyes open in no acute distress Respiratory: Regular, unlabored respirations on room air Neurologic: Patient with global aphasia does not respond to name but intermittently follows  midline and left body commands .  He blinks to threat on the left but not on the right and has a left gaze preference but  can cross midline.  Right facial droop noted, moves left upper and lower extremities spontaneously and will withdraw to right upper and lower extremities to noxious stimuli.  ASSESSMENT/PLAN [name] is a 42 y.o. male with history of diabetes, hypertension, hyperlipidemia and smoking who was initially admitted to [facility] on 6/19 with right hemiplegia and aphasia.  He presented outside the window for TNK and thrombectomy.  He was found to have a left basal ganglia infarct, which is now extended into much of the left MCA territory, and he also developed a small acute infarct in posterior limb of right internal capsule.  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  Yesterday, he was transferred here to be seen by stroke team and possibly for conventional angiogram given severe stenosis.  Admission urine toxicology was positive for THC.  Patient's grandmother states that he does not often go to the doctor and does smoke cigarettes.  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  Similar petechial hemorrhage.  New interval small acute infarct in the right PLIC CTA head and neck severe, near occlusive stenosis in left M1 with poor perfusion of proximal M2 branches, severe stenosis in left V4, severe stenosis in left supraclinoid ICA, moderate stenosis in right supraclinoid ICA, multifocal irregularity and mild to moderate stenosis in left V1 and V2 segments, mild stenosis in right V1 and proximal V2  segments Carotid Doppler no significant plaque formation 2D Echo EF 60 to 65%  LDL 265 HgbA1c 11.7 VTE prophylaxis -Lovenox No antithrombotic prior to admission, now on aspirin 81 mg daily and clopidogrel 75 mg daily for at least 3 months due to severe M1 stenosis. Therapy recommendations: CIR Disposition: Pending  ICA and MCA stenosis CT head and neck severe, near occlusive stenosis in left M1 with poor perfusion of proximal M2 branches, severe stenosis in left supraclinoid ICA, moderate stenosis in right supraclinoid ICA Plan for angiogram with intent of stenting next Tuesday given petechial hemorrhage  Hypertension Home meds: None Stable Outside of permissive hypertension window On amlodipine 5 BP goal 140-180 before the angiogram next Tuesday  Long-term BP goal normotensive  Hyperlipidemia Home meds:  Atorvastatin 10 mg daily  LDL 265, goal < 70 Lipitor increased to 80 Continue statin at discharge  Diabetes type II Uncontrolled Home meds: Lantus 30 units daily HgbA1c 11.7, goal < 7.0 CBGs SSI Close PCP follow-up for better DM control  Tobacco abuse Current smoker Smoking cessation counseling will be provided  AKI Creatinine 1.57-1.45-1.57->1.27 On IV fluid and TF CVP monitoring  Dysphagia Did not pass a swallow Currently n.p.o. Core track placed On tube feeding Speech on board  Other Stroke Risk Factors Substance abuse - UDS:  THC POSITIVE. Patient will be advised to stop using due to stroke risk.  Other Active Problems None  Hospital day # 11 Patient continues to have significant global aphasia with dense right hemiplegia.  Continue permissive hypertension and dual antiplatelet therapy.  Continue physical occupational and speech therapy.  No family at the bedside.  Greater than 50% time during this 25-minute visit was spent in counseling and coordination of care and discussion with patient and care team and answering questions.D/W Dr Dayna Barker   Delia Heady, MD  Stroke Neurology 06/25/2023 1:26 PM      To contact Stroke Continuity provider, please refer to [email or web address]. After hours, contact General Neurology

## 2023-06-25 NOTE — Progress Notes (Signed)
PROGRESS NOTE Zachary Winters  ZOX:[id number] DOB: 02-03-1981 DOA: 06/14/2023 PCP: System, Provider Not In  Brief Narrative/Hospital Course: 42 year old gentleman with poorly controlled diabetes mellitus on insulin, chronic daily smoker, hypertension, history of marijuana use, diabetic peripheral neuropathy, hyperlipidemia was sent to the emergency department by [facility] with right-sided weakness right-sided facial droop and difficulty speaking.  He was last known to be well 2 days ago at 6 AM when his sister noticed he went out to smoke on ring camera.  He apparently lives with his sister.  Sister noticed the patient lying on the floor having a difficult time communicating only able to nod yes and no.  She noticed that for the last 2 days he has been lying around and sleeping. In ED: had significant aphasia and right-sided hemiparesis.  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  His blood glucose was 109.  Code Stroke was not activated due to being 48 hours since last known well time.  06/14/23:Admitted at [facility]> MRI brain:acute to subacute perforator infarct at the left basal ganglia and adjacent white matter with petechial hemorrhage. 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 Subsequently underwent TTE, TEE that was negative for hyper coagulable labs ordered seen by neuro PT OT speech, CT chest abdomen pelvis unremarkable and occult malignancy. 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 New/interval small acute infarct in the right posterior limb of the internal capsule> neurology was notified advised to transfer to [facility] for further neurology  management. 06/20/23 night patient arrived at [facility] 06/21/23;Cortrak ordered  Subjective: Seen and examined Alert awake Secretions coming our of mouth- I suctioned Follows commands Still dense hemiplegia on rt and aphasia Overnight afebrile, BP has improved holding in 140s 150s this morning Labs with creatinine 1.3 blood sugar in high 200  Assessment and Plan: Principal Problem:   Acute stroke due to ischemia [facility]) Active Problems:   Hypertension associated with diabetes (HCC)   Type 2 diabetes mellitus with diabetic neuropathy (HCC)   Hyperlipidemia associated with type 2 diabetes mellitus (HCC)   Dysphonia   Aphasia   Tobacco use disorder   Recreational drug use   Diabetic neuropathy (HCC)   Vitamin D deficiency   Apraxia  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 Plan for angiogram with intent of stenting next Tuesday given petechial hemorrhage. Continue Lipitor 80 mg,aspirin 81 Plavix 75 x 3 months for now  as per neurology Continues to have dense right hemiplegia, aphasia, swallowing function somewhat better completed MBS and on dysphagia 1 diet  We discussed about risk of aspiration cont suctioning, aspiration precaution Discussed w/ neurology  Dysphagia: Continue core track tube feeding , speech eval appreciated s/p MBS 6/28> placed on dysphagia 1 diet .  Hypertension: BP is now fairly controlled continue amlodipine 10 mg Coreg 6.25 twice daily, monitor and slowly adjust medication for BP goal  140-180 before angiogram next Tuesday after which normotensive.  Hyperlipidemia: LDL 265 started on Atorvastatin 80 mg  T2DM  w/  uncontrolled hyperglycemia A1c 9.7, with diabetic peripheral neuropathy: Appears to be noncompliant, blood sugar now poorly controlled after initiating tube feeding, increase Semglee to 10 units twice daily and ssi to q4hr > monitor and adjust.  Recent Labs  Lab 06/24/23 1235 06/24/23 1644 06/24/23 2106 06/25/23 0006 06/25/23 0924  GLUCAP 234* 243* 286* 243* 268*   AKI vs CKD 3a Metabolic acidosis: Creatinine previously 1.0 in 2022 on admission 1.5, remains overall better keep on gentle IV rehydration and tube feeding Recent Labs    06/14/23 0911 06/15/23 0514 06/16/23 0416 06/21/23 0743 06/22/23 0713 06/23/23 0628 06/24/23 0358 06/25/23 0549  BUN 19 16 14 9 7 9 9 13   CREATININE 1.57* 1.49* 1.45* 1.57* 1.27* 1.29* 1.26* 1.37*  CO2 27 25 24  19* 23 24 25 27     Anemia hemoglobin slowly downtrending monitor no obvious bleeding noted, likely dilutional Recent Labs  Lab 06/21/23 0743  HGB 9.2*  HCT 27.9*   Hypokalemia resolved  Recent Labs  Lab 06/21/23 0743 06/22/23 0713 06/23/23 0628 06/24/23 0358 06/25/23 0549  K 3.6 3.6 3.4* 3.6 3.7   Severe vitamin D deficiency: Unable to use weekly high-dose vitamin D replacement w/ NGT.  Continue vitamin D liquid daily until able to take p.o.  Tobacco abuse: will need to stop smoking.  Continue scheduled nicotine patch  Recreational drug use UDS positive for marijuana: TOC consult for resources  Deconditioning debility Continue aggressive PT OT speech eval. use restraint on the left side to protect core track tube.  Right upper lobe predominant tree-in-bud pattern suggesting chronic inflammation atypical infection such as MAC: Will refer him out to pulmonary clinic at discharge for further evaluation of this.  At risk of aspiration continue suctioning  CT noted advanced coronary calcifications:Will refer him out to cardiology at discharge for surveillance.   DVT prophylaxis: enoxaparin (LOVENOX) injection 40 mg Start:  06/14/23 1045 Code Status:   Code Status: Full Code Family Communication: plan of care discussed with patient/mother at bedside. Patient status is:  inpatient because of acute stroke Level of care: Telemetry Medical   Dispo: The patient is from: Home            Anticipated disposition: CIR Objective: Vitals last 24 hrs: Vitals:   06/24/23 2038 06/25/23 0015 06/25/23 0535 06/25/23 0922  BP: (!) 147/84 101/69 (!) 156/87 (!) 149/97  Pulse:   86 79  Resp: 16  16   Temp: 99.4 F (37.4 C) 99.4 F (37.4 C) 99.4 F (37.4 C) 98.2 F (36.8 C)  TempSrc: Oral Oral Oral Oral  SpO2: 99% 100% 97% 96%  Weight:      Height:       Weight change:  Physical exam General exam: alert awake, oriented ,older than stated age HEENT:Oral mucosa moist, Ear/Nose WNL grossly Respiratory system: Bilaterally clear BS,no use of accessory muscle Cardiovascular system: S1 & S2 +, No JVD. Gastrointestinal system: Abdomen soft,NT,ND, BS+ Nervous System: Alert, awake, dense hemiplegia on the right side, aphasic  Extremities: LE edema neg,distal peripheral pulses palpable and warm.  Skin: No rashes,no icterus. MSK: Normal muscle bulk,tone, power   Medications reviewed:  Scheduled Meds:  amLODipine  10 mg Per NG tube Daily   aspirin  81 mg Per Tube Daily   atorvastatin  80 mg Per Tube QPM   carvedilol  6.25 mg Oral BID WC   cholecalciferol  400 Units Per Tube Daily   clopidogrel  75 mg Per NG tube Daily   enoxaparin (LOVENOX) injection  40 mg Subcutaneous Daily   insulin aspart  0-9 Units Subcutaneous Q4H   insulin glargine-yfgn  10 Units Subcutaneous BID   nicotine  21 mg Transdermal Daily  Continuous Infusions:  sodium chloride 50 mL/hr at 06/24/23 2241   feeding supplement (GLUCERNA 1.5 CAL) 1,000 mL (06/24/23 2243)    Diet Order             DIET - DYS 1 Room service appropriate? No; Fluid consistency: Honey Thick  Diet effective now                 Nutrition Problem: Inadequate oral  intake Etiology: dysphagia Signs/Symptoms: NPO status Interventions: Refer to RD note for recommendations   Intake/Output Summary (Last 24 hours) at 06/25/2023 0935 Last data filed at 06/25/2023 0740 Gross per 24 hour  Intake 3946.14 ml  Output --  Net 3946.14 ml   Net IO Since Admission: 5,462.93 mL [06/25/23 0935]  Wt Readings from Last 3 Encounters:  06/24/23 89.4 kg  09/05/21 68 kg  07/31/21 65.3 kg     Unresulted Labs (From admission, onward)     Start     Ordered   06/22/23 0500  Basic metabolic panel  Daily,   R     Question:  Specimen collection method  Answer:  Lab=Lab collect   06/21/23 1310   06/21/23 0500  Creatinine, serum  (enoxaparin (LOVENOX)    CrCl >/= 30 ml/min)  Weekly,   R     Comments: while on enoxaparin therapy    06/14/23 1038   06/17/23 0500  Factor 5 leiden  (Hypercoagulable Panel, Comprehensive (PNL))  Tomorrow morning,   R        06/16/23 1253   06/17/23 0500  Prothrombin gene mutation  (Hypercoagulable Panel, Comprehensive (PNL))  Tomorrow morning,   R        06/16/23 1253          Data Reviewed: I have personally reviewed following labs and imaging studies CBC: Recent Labs  Lab 06/21/23 0743  WBC 9.8  HGB 9.2*  HCT 27.9*  MCV 88.9  PLT 252   Basic Metabolic Panel: Recent Labs  Lab 06/21/23 0743 06/21/23 1721 06/22/23 0713 06/22/23 1631 06/23/23 0628 06/24/23 0358 06/25/23 0549  NA 145  --  143  --  143 143 140  K 3.6  --  3.6  --  3.4* 3.6 3.7  CL 110  --  112*  --  109 110 105  CO2 19*  --  23  --  24 25 27   GLUCOSE 133*  --  136*  --  236* 199* 291*  BUN 9  --  7  --  9 9 13   CREATININE 1.57*  --  1.27*  --  1.29* 1.26* 1.37*  CALCIUM 8.5*  --  8.6*  --  8.6* 8.5* 8.6*  MG  --  2.0 1.8 1.8 1.7  --   --   PHOS  --  2.8 2.7 2.9 3.8  --   --    Recent Labs  Lab 06/24/23 1235 06/24/23 1644 06/24/23 2106 06/25/23 0006 06/25/23 0924  GLUCAP 234* 243* 286* 243* 268*  No results found for this or any previous visit  (from the past 240 hour(s)).  Antimicrobials: Anti-infectives (From admission, onward)    None      Culture/Microbiology    Component  Value Date/Time   SDES  03/14/2021 1728    IN/OUT CATH URINE Performed at [facility], [address], [place], [place] [address]    [facility]  03/14/2021 1728    NONE Performed at [facility], [address], [place], [place] [address]    CULT MULTIPLE SPECIES PRESENT, SUGGEST RECOLLECTION (A) 03/14/2021 1728   REPTSTATUS 03/16/2021 FINAL 03/14/2021 1728    Radiology Studies: DG Swallowing Func-Speech Pathology  Result Date: 06/23/2023 Table formatting from the original result was not included. Modified Barium Swallow Study Patient Details Name: JAYDEEN CORCORAN MRN: [id number] Date of Birth: 1981-12-25 Today's Date: 06/23/2023 HPI/PMH: HPI: Pt is a 42 year old male who presented with right-sided weakness, right-sided facial droop and difficulty speaking. 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 BSE 6/19 with minimal R anterior spillage, decreased coordination and R anterior spillage; dysphagia 2 diet with thin liquids recommended. 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 Similar petechial hemorrhage without mass occupying acute hemorrhage. New/interval small acute infarct in the right posterior limb of the internal capsule. MBS 6/24: mod/severe oral phase dysphagia and mild/mod pharyngeal phase dysphagia characterized by weak lingual movement and bolus manipulation with labial spillage, oral holding, and spillover to the pharynx. An NPO status recommended with snacks of puree and honey thick liquids. PMH: diabetes mellitus on insulin, chronic daily smoker, hypertension, history of marijuana  use, diabetic peripheral neuropathy, hyperlipidemia Clinical Impression: Clinical Impression: Pt presents with oropharyngeal dysphagia characterized by characterized by weak bolus manipulation, a pharyngeal delay and reduction in labial seal, bolus cohesion, tongue base retraction, and anterior laryngeal movement. Pt demonstrated right-sided anterior spillage across liquid consistenties, impaired A-P transport, inadequate lingual movement for mastication (bolus ultimately removed), and premature spillage to the pyriform sinuses. Pt intermittently used a posterior head tilt to facilitate A-P transport, but moderate oral residue remained in the lateral sulci and floor of the mouth. Penetration (PAS 3) and aspiration (PAS 7) were inconsistently noted nectar thick liquids via cup secondary to premature spillage combined with the pharyngeal delay. Pt's violent cough projected oral residue across the fluoro suite and propelled some aspirated material superiorly, but was ineffective in expelling all of the aspirated material. No instances of laryngeal invasion were observed with thin liquids, but this is likely since significant anterior spillage and oral residue resulted in pt only swallowing very small boluses of thin liquids. Pt's overall swallow function is improved with aspiration now triggering a violent cough, pt more consistently swallowing, and having improved bolus manipulation but pt's dysphagia remains significant. A dysphagia 1 diet with honey thick liquids is recommended at this time and SLP will continue to follow for treatment. Factors that may increase risk of adverse event in presence of aspiration [name] & [name] 2021): Factors that may increase risk of adverse event in presence of aspiration [name] & [name] 2021): Limited mobility; Dependence for feeding and/or oral hygiene; Inadequate oral hygiene Recommendations/Plan: Swallowing Evaluation Recommendations Swallowing Evaluation Recommendations  Recommendations: PO diet PO Diet Recommendation: Dysphagia 1 (Pureed); Moderately thick liquids (Level 3, honey thick) Liquid Administration via: Spoon; Cup Medication Administration: Crushed with puree (or via cortrak) Supervision: Full supervision/cueing for swallowing strategies; Staff to assist with self-feeding Swallowing strategies  : Slow rate; Small bites/sips; Check for pocketing or oral  holding; Check for anterior loss; Multiple dry swallows after each bite/sip Postural changes: Position pt fully upright for meals; Stay upright 30-60 min after meals Oral care recommendations: Staff/trained caregiver to provide oral care; Oral care before ice chips/water Caregiver Recommendations: Have oral suction available Treatment Plan Treatment Plan Treatment recommendations: Therapy as outlined in treatment plan below Follow-up recommendations: Acute inpatient rehab (3 hours/day) Functional status assessment: Patient has had a recent decline in their functional status and demonstrates the ability to make significant improvements in function in a reasonable and predictable amount of time. Treatment frequency: Min 2x/week Treatment duration: 2 weeks Interventions: Aspiration precaution training; Oropharyngeal exercises; Patient/family education; Trials of upgraded texture/liquids Recommendations Recommendations for follow up therapy are one component of a multi-disciplinary discharge planning process, led by the attending physician.  Recommendations may be updated based on patient status, additional functional criteria and insurance authorization. Assessment: Orofacial Exam: Orofacial Exam Oral Cavity - Dentition: Adequate natural dentition Anatomy: Anatomy: Suspected cervical osteophytes Boluses Administered: Boluses Administered Boluses Administered: Thin liquids (Level 0); Mildly thick liquids (Level 2, nectar thick); Moderately thick liquids (Level 3, honey thick); Puree; Solid  Oral Impairment Domain: Oral  Impairment Domain Lip Closure: Escape beyond mid-chin Tongue control during bolus hold: Posterior escape of greater than half of bolus Bolus preparation/mastication: Minimal chewing/mashing with majority of bolus unchewed Bolus transport/lingual motion: Repetitive/disorganized tongue motion Oral residue: Residue collection on oral structures Location of oral residue : Floor of mouth; Tongue Initiation of pharyngeal swallow : Pyriform sinuses  Pharyngeal Impairment Domain: Pharyngeal Impairment Domain Soft palate elevation: No bolus between soft palate (SP)/pharyngeal wall (PW) Laryngeal elevation: Complete superior movement of thyroid cartilage with complete approximation of arytenoids to epiglottic petiole Anterior hyoid excursion: Partial anterior movement Epiglottic movement: Complete inversion Laryngeal vestibule closure: Incomplete, narrow column air/contrast in laryngeal vestibule Pharyngeal stripping wave : Present - diminished Pharyngeal contraction (A/P view only): N/A Pharyngoesophageal segment opening: Complete distension and complete duration, no obstruction of flow Tongue base retraction: Narrow column of contrast or air between tongue base and PPW Pharyngeal residue: Collection of residue within or on pharyngeal structures Location of pharyngeal residue: Valleculae; Pyriform sinuses  Esophageal Impairment Domain: Esophageal Impairment Domain Esophageal clearance upright position: Complete clearance, esophageal coating Pill: Pill Consistency administered: -- (NT) Penetration/Aspiration Scale Score: Penetration/Aspiration Scale Score 1.  Material does not enter airway: Puree; Moderately thick liquids (Level 3, honey thick) 3.  Material enters airway, remains ABOVE vocal cords and not ejected out: Mildly thick liquids (Level 2, nectar thick) 7.  Material enters airway, passes BELOW cords and not ejected out despite cough attempt by patient: Mildly thick liquids (Level 2, nectar thick) Compensatory  Strategies: Compensatory Strategies Compensatory strategies: No   General Information: Caregiver present: No  Diet Prior to this Study: NPO   Temperature : Normal   Respiratory Status: WFL   Supplemental O2: None (Room air)   History of Recent Intubation: No  Behavior/Cognition: Alert; Pleasant mood; Requires cueing; Impulsive Self-Feeding Abilities: Needs assist with self-feeding Baseline vocal quality/speech: Not observed Volitional Cough: Unable to elicit Volitional Swallow: Unable to elicit Exam Limitations: No limitations (impusivity) Goal Planning: Prognosis for improved oropharyngeal function: Fair Barriers to Reach Goals: Language deficits; Severity of deficits No data recorded Patient/Family Stated Goal: For pt to say "granny" - MET! Consulted and agree with results and recommendations: Nurse; Pt unable/family or caregiver not available Pain: Pain Assessment Pain Assessment: Faces Faces Pain Scale: 4 Breathing: 0 Negative Vocalization: 0 Facial Expression: 0 Body Language: 0 Consolability:  0 PAINAD Score: 0 Pain Location: grimacing with R UE ROM and activity in general Pain Descriptors / Indicators: Discomfort; Grimacing Pain Intervention(s): Limited activity within patient's tolerance; Monitored during session End of Session: Start Time:SLP Start Time (ACUTE ONLY): 1255 Stop Time: SLP Stop Time (ACUTE ONLY): 1315 Time Calculation:SLP Time Calculation (min) (ACUTE ONLY): 20 min Charges: SLP Evaluations $ SLP Speech Visit: 1 Visit SLP Evaluations $MBS Swallow: 1 Procedure $Swallowing Treatment: 1 Procedure $Speech Treatment for Individual: 1 Procedure SLP visit diagnosis: SLP Visit Diagnosis: Dysphagia, oropharyngeal phase (R13.12) Past Medical History: Past Medical History: Diagnosis Date  Diabetes mellitus without complication (HCC)  Past Surgical History: Past Surgical History: Procedure Laterality Date  TEE WITHOUT CARDIOVERSION N/A 06/16/2023  Procedure: TRANSESOPHAGEAL ECHOCARDIOGRAM (TEE);  Surgeon:  Jonelle Sidle, MD;  Location: [facility];  Service: Cardiovascular;  Laterality: N/A; Yvone Neu I. Vear Clock, MS, CCC-SLP Neuro Diagnostic Specialist Acute Rehabilitation Services Office number: [phone] Scheryl Marten 06/23/2023, 3:06 PM    LOS: 11 days   Lanae Boast, MD [facility]  06/25/2023, 9:35 AM

## 2023-06-25 NOTE — Significant Event (Signed)
Left wrist restraints removed. Patient not pulling on tube/IV; following safety instructions at this time.

## 2023-06-26 DIAGNOSIS — I639 Cerebral infarction, unspecified: Secondary | ICD-10-CM | POA: Diagnosis not present

## 2023-06-26 LAB — BASIC METABOLIC PANEL
Anion gap: 9 (ref 5–15)
BUN: 15 mg/dL (ref 6–20)
CO2: 28 mmol/L (ref 22–32)
Calcium: 8.4 mg/dL — ABNORMAL LOW (ref 8.9–10.3)
Chloride: 104 mmol/L (ref 98–111)
Creatinine, Ser: 1.31 mg/dL — ABNORMAL HIGH (ref 0.61–1.24)
GFR, Estimated: >60 mL/min (ref >60)
Glucose, Bld: 275 mg/dL — ABNORMAL HIGH (ref 70–99)
Potassium: 3.8 mmol/L (ref 3.5–5.1)
Sodium: 141 mmol/L (ref 135–145)

## 2023-06-26 LAB — CBC
HCT: 28.5 % — ABNORMAL LOW (ref 39.0–52.0)
Hemoglobin: 9.2 g/dL — ABNORMAL LOW (ref 13.0–17.0)
MCH: 29 pg (ref 26.0–34.0)
MCHC: 32.3 g/dL (ref 30.0–36.0)
MCV: 89.9 fL (ref 80.0–100.0)
Platelets: 270 10*3/uL (ref 150–400)
RBC: 3.17 MIL/uL — ABNORMAL LOW (ref 4.22–5.81)
RDW: 12.7 % (ref 11.5–15.5)
WBC: 10.5 10*3/uL (ref 4.0–10.5)
nRBC: 0 % (ref 0.0–0.2)

## 2023-06-26 LAB — GLUCOSE, CAPILLARY
Glucose-Capillary: 230 mg/dL — ABNORMAL HIGH (ref 70–99)
Glucose-Capillary: 206 mg/dL — ABNORMAL HIGH (ref 70–99)
Glucose-Capillary: 224 mg/dL — ABNORMAL HIGH (ref 70–99)
Glucose-Capillary: 280 mg/dL — ABNORMAL HIGH (ref 70–99)
Glucose-Capillary: 306 mg/dL — ABNORMAL HIGH (ref 70–99)
Glucose-Capillary: 134 mg/dL — ABNORMAL HIGH (ref 70–99)
Glucose-Capillary: 88 mg/dL (ref 70–99)

## 2023-06-26 MED ORDER — FREE WATER
150.0000 mL | Status: DC
  Administered 2023-06-26 – 2023-06-27 (×5): 150 mL

## 2023-06-26 MED ORDER — INSULIN ASPART 100 UNIT/ML IJ SOLN
4.0000 [IU] | INTRAMUSCULAR | Status: DC
  Administered 2023-06-26: 4 [IU] via SUBCUTANEOUS

## 2023-06-26 MED ORDER — GLUCERNA 1.5 CAL PO LIQD
1280.0000 mL | ORAL | Status: DC
  Administered 2023-06-26: 1280 mL
  Filled 2023-06-26 (×2): qty 2000
  Filled 2023-06-26: qty 1422

## 2023-06-26 NOTE — Progress Notes (Signed)
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 6/23 pt developed acute worsening of swallow function; MRI showing interval increase in size of acute L MCA infarction.Transported to [facility] 6/25. History of poorly controlled diabetes mellitus on insulin, chronic daily smoker, hypertension, history of marijuana use, diabetic peripheral neuropathy, and hyperlipidemia.    PT Comments  Great effort put forth by patient this afternoon. Willing and eager to work with therapy despite family reporting pt sitting up in recliner all morning and just returning to bed prior to PT arrival. Nursing reports +2 stand pivot transfer back to bed (deferred this afternoon as pt had just gotten into bed.) He was able to practice seated and standing balance with rehab team. Performed Mod A +2 sit to stand transfers, seated balance activities finding midline (rightward lean bias). Standing pre-gait activities including weight shift and march (lifting Rt LE from floor) but requiring up to mod assist +2 for balance and Rt knee block when WB on Rt; shows ability to bear full weight on LLE without buckling. Unable to rotate head towards left and track while standing. Gentle ROM for RUE. Patient will continue to benefit from skilled physical therapy services to further improve independence with functional mobility. Patient will benefit from intensive inpatient follow up therapy, >3 hours/day      Assistance Recommended at Discharge Frequent or constant Supervision/Assistance  If plan is discharge home, recommend the following:  Can travel by private vehicle    Two people to help with  walking and/or transfers;Two people to help with bathing/dressing/bathroom;Assistance with cooking/housework;Assistance with feeding;Direct supervision/assist for financial management;Direct supervision/assist for medications management;Assist for transportation;Help with stairs or ramp for entrance      Equipment Recommendations  Other (comment) (to be determined in next venue of care)    Recommendations for Other Services       Precautions / Restrictions Precautions Precautions: Fall Restrictions Weight Bearing Restrictions: No     Mobility  Bed Mobility Overal bed mobility: Needs Assistance Bed Mobility: Supine to Sit, Sit to Supine, Rolling Rolling: Min assist   Supine to sit: Mod assist, HOB elevated Sit to supine: HOB elevated, Mod assist   General bed mobility comments: Min assist for rolling in bed, cues for LUE use on rail. Mod assist for trunk and RLE support in/out of bed with cues for awareness and technique.    Transfers Overall transfer level: Needs assistance Equipment used: 2 person hand held assist Transfers: Sit to/from Stand Sit to Stand: Mod assist, +2 physical assistance, +2 safety/equipment           General transfer comment: Mod assist +2 with hand held support for boost and balance to rise from slightly elevated bed surface x2. (Deferred transfer as pt has been in chair all morning and recently returned to bed before PT arrival.) Pt required Rt knee block at all times. Has Rightward lean. RUE supported. Facilitated leftward shift to find midline with transitions. Unable to turn head to left to target where to reach with LUE.    Ambulation/Gait             Pre-gait activities: Stood at [employer] and worked on [employer] shifting. Pt able to RLE from  ground. Unsure if automatic from spasticity/tone or if he volitionally did this - pt shook his head yes when asked if he was attempting to lift LLE from floor.     Stairs             Wheelchair  Mobility     Tilt Bed    Modified Rankin (Stroke Patients Only) Modified Rankin (Stroke Patients Only) Pre-Morbid Rankin Score: No symptoms Modified Rankin: Severe disability     Balance Overall balance assessment: Needs assistance Sitting-balance support: Feet supported, Single extremity supported Sitting balance-Leahy Scale: Poor Sitting balance - Comments: Right lateral lean. Utilized LUE to stabilize, focused on seated balance with reaching and orienting to midline. Postural control: Right lateral lean Standing balance support: During functional activity, Single extremity supported Standing balance-Leahy Scale: Poor Standing balance comment: Min-modA +2 for balance in standing. Tolerated 2 bouts (total of approx 5 min in standing.) Balance activity with weight shift to midline, target fixation, knee bends, and slight rotation (however was unable to turn head towards left). Required Rt knee block at all times.                            Cognition Arousal/Alertness: Awake/alert Behavior During Therapy: Flat affect Overall Cognitive Status: Difficult to assess Area of Impairment: Safety/judgement, Awareness, Problem solving, Attention, Memory, Following commands                   Current Attention Level: Sustained Memory: Decreased short-term memory Following Commands: Follows one step commands with increased time, Follows multi-step commands inconsistently, Follows one step commands inconsistently Safety/Judgement: Decreased awareness of safety, Decreased awareness of deficits Awareness: Intellectual Problem Solving: Slow processing, Decreased initiation, Difficulty sequencing, Requires verbal cues, Requires tactile cues General Comments: Pt nodding head yes/no to respond to questions, fairly consistent, seems to correct himself if given time and asked if yes/no was what he meant.        Exercises      General Comments General comments (skin integrity,  edema, etc.): Gentle ROM Rt elbow flex/ext.      Pertinent Vitals/Pain Pain Assessment Pain Assessment: Faces Faces Pain Scale: Hurts little more Pain Location: RLE Pain Descriptors / Indicators: Grimacing, Moaning Pain Intervention(s): Monitored during session, Repositioned    Home Living                          Prior Function            PT Goals (current goals can now be found in the care plan section) Acute Rehab PT Goals Patient Stated Goal: none stated PT Goal Formulation: With patient/family Time For Goal Achievement: 06/29/23 Potential to Achieve Goals: Good Progress towards PT goals: Progressing toward goals    Frequency    Min 4X/week      PT Plan Current plan remains appropriate    Co-evaluation              AM-PAC PT "6 Clicks" Mobility   Outcome Measure  Help needed turning from your back to your side while in a flat bed without using bedrails?: A Lot Help needed moving from lying on your back to sitting on the side of a flat bed without using bedrails?: A Lot Help needed moving to and from a bed to a chair (including a wheelchair)?: Total Help needed standing up from a chair using your arms (e.g., wheelchair or bedside chair)?: Total Help  needed to walk in hospital room?: Total Help needed climbing 3-5 steps with a railing? : Total 6 Click Score: 8    End of Session Equipment Utilized During Treatment: Gait belt Activity Tolerance: Patient tolerated treatment well Patient left: in bed;with call bell/phone within reach;with family/visitor present;with bed alarm set Nurse Communication: Mobility status PT Visit Diagnosis: Unsteadiness on feet (R26.81);Other abnormalities of gait and mobility (R26.89);Muscle weakness (generalized) (M62.81);Difficulty in walking, not elsewhere classified (R26.2);Other symptoms and signs involving the nervous system (R29.898);Hemiplegia and hemiparesis Hemiplegia - Right/Left: Right Hemiplegia -  dominant/non-dominant: Dominant Hemiplegia - caused by: Nontraumatic intracerebral hemorrhage;Cerebral infarction     Time: 1345-1405 PT Time Calculation (min) (ACUTE ONLY): 20 min  Charges:    $Neuromuscular Re-education: 8-22 mins PT General Charges $$ ACUTE PT VISIT: 1 Visit                     Kathlyn Sacramento, PT, DPT [facility]  Rehabilitation Services Physical Therapist Office: [phone] Website: [email or web address]    Berton Mount 06/26/2023, 2:45 PM

## 2023-06-26 NOTE — Progress Notes (Signed)
Nutrition Follow-up  DOCUMENTATION CODES:   Not applicable  INTERVENTION:  - Continue Dys 1, honey thick liquids.   - Modify to nocturnal feeds: Glucerna 1.5 @ 80 mL/hr x16 hrs (1280 mL) (5PM-9AM).  - This will provide 1920 kcals 105 gm protein, 971 mL free water.  (Meeting 96% of kcal needs; 100% of protein needs) - Add FWF 150 mL Q4H - this will provide a total of 1871 mL free water w/ TF regimen.   NUTRITION DIAGNOSIS:   Inadequate oral intake related to dysphagia as evidenced by NPO status.   GOAL:   Patient will meet greater than or equal to 90% of their needs - Met with TF.   MONITOR:   TF tolerance, Diet advancement, Labs, Weight trends  REASON FOR ASSESSMENT:   Other (Comment) (cortrak)    ASSESSMENT:   Pt with hx of DM, HTN, HLD, and tobacco abuse presented to [facility] with right-sided weakness and facial droop. Workup in ED revealed an acute ischemic stroke.  Meds reviewed: lipitor, Vit D3, sliding scale insulin, semglee. Labs reviewed: Creatinine elevated. IVF: NS @ 50 mL/hr. I/Os reviewed: urine color - yellow/straw.   Pt has been advanced to a Dys 1, honey thick diet. RN reports that the pt is reporting he is not hungry and has not been eating any of his meals. Pt remains with Cortrak in place and receiving TF at goal rate. RN reports that the pt is tolerating well. Now that pt has diet ordered. RD will modify pt to nocturnal feeds to allow some time off of the pump during the day for PO intakes. Will still plan to meet nutritional needs with TF due to 0% intakes. Will continue to monitor and adjust TF as intakes improve.   Pt still has IVF running, discussed with MD about switching this for FWF.  Diet Order:   Diet Order             DIET - DYS 1 Room service appropriate? No; Fluid consistency: Honey Thick  Diet effective now                   EDUCATION NEEDS:   Not appropriate for education at this time  Skin:  Skin Assessment: Reviewed RN  Assessment  Last BM:  6/29 - type 6  Height:   Ht Readings from Last 1 Encounters:  06/16/23 6\' 1"  (1.854 m)    Weight:   Wt Readings from Last 1 Encounters:  06/24/23 89.4 kg    Ideal Body Weight:  83.6 kg  BMI:  Body mass index is 26 kg/m.  Estimated Nutritional Needs:   Kcal:  2000-2300 kcal/d  Protein:  95-115g/d  Fluid:  2-2.3L/d  Bethann Humble, RD, LDN, CNSC.

## 2023-06-26 NOTE — Inpatient Diabetes Management (Signed)
Inpatient Diabetes Program Recommendations  AACE/ADA: New Consensus Statement on Inpatient Glycemic Control (2015)  Target Ranges:  Prepandial:   less than 140 mg/dL      Peak postprandial:   less than 180 mg/dL (1-2 hours)      Critically ill patients:  140 - 180 mg/dL   Lab Results  Component Value Date   GLUCAP 280 (H) 06/26/2023   HGBA1C 11.7 (H) 06/14/2023    Review of Glycemic Control  Latest Reference Range & Units 06/22/23 08:33 06/22/23 11:54 06/22/23 16:41 06/22/23 20:11 06/23/23 01:05 06/23/23 04:16 06/23/23 06:26 06/23/23 07:50  Glucose-Capillary 70 - 99 mg/dL 409 (H) 811 (H) 914 (H) 182 (H) 223 (H) 182 (H) 245 (H) 181 (H)   Current orders for Inpatient glycemic control:  Novolog 0-9 units Q6 hours Semglee 10 units bid  Note: pt on Tube Feeds, being increased to Glucerna 80 ml/hour  Inpatient Diabetes Program Recommendations:    -  consider adding Novolog Tube Feed coverage, Novolog 4 units Q4 hours.  Thanks,  Christena Deem RN, MSN, BC-ADM Inpatient Diabetes Coordinator Team Pager [phone] (8a-5p)

## 2023-06-26 NOTE — Significant Event (Signed)
Patient tolerated sitting up in the chair for several hours today. Per patient's grandmother, patient is feeling restlessness on the left leg and wants to return to bed.

## 2023-06-26 NOTE — Significant Event (Signed)
Went into patient's room after shift change to say good night and patient vomited copious amounts of emesis. Cortrak tube also came out as well through his mouth; patient was coughing. Staff gave zofran; oral care done; patient washed up and new linens/gown placed on patient. Patient's grandmother was at bedside when all this occurred. On call MD notified.

## 2023-06-26 NOTE — Progress Notes (Signed)
STROKE TEAM PROGRESS NOTE   INTERVAL HISTORY [name] with his grandmother at the bedside.  He has been hemodynamically stable overnight, and his neurological exam remains stable.  Remains globally aphasic but can follow   midline and some simple left-sided commands.  Remains with dense right hemiplegia.  Vital signs stable.   Vitals:   06/25/23 2347 06/26/23 0325 06/26/23 0838 06/26/23 1202  BP: (!) 175/91 (!) 167/90 (!) 152/90 (!) 137/90  Pulse: 81 94 96 97  Resp: 18 20 17 20   Temp: 98.6 F (37 C) 98.2 F (36.8 C) 99.4 F (37.4 C) 98.8 F (37.1 C)  TempSrc: Oral Oral Oral Oral  SpO2: 98% 100% 100% 99%  Weight:      Height:       CBC:  Recent Labs  Lab 06/21/23 0743 06/26/23 0708  WBC 9.8 10.5  HGB 9.2* 9.2*  HCT 27.9* 28.5*  MCV 88.9 89.9  PLT 252 270   Basic Metabolic Panel:  Recent Labs  Lab 06/22/23 1631 06/23/23 0628 06/24/23 0358 06/25/23 0549 06/26/23 0708  NA  --  143   < > 140 141  K  --  3.4*   < > 3.7 3.8  CL  --  109   < > 105 104  CO2  --  24   < > 27 28  GLUCOSE  --  236*   < > 291* 275*  BUN  --  9   < > 13 15  CREATININE  --  1.29*   < > 1.37* 1.31*  CALCIUM  --  8.6*   < > 8.6* 8.4*  MG 1.8 1.7  --   --   --   PHOS 2.9 3.8  --   --   --    < > = values in this interval not displayed.   Lipid Panel: No results for input(s): "CHOL", "TRIG", "HDL", "CHOLHDL", "VLDL", "LDLCALC" in the last 168 hours. HgbA1c: No results for input(s): "HGBA1C" in the last 168 hours. Urine Drug Screen:  No results for input(s): "LABOPIA", "COCAINSCRNUR", "LABBENZ", "AMPHETMU", "THCU", "LABBARB" in the last 168 hours.  Alcohol Level No results for input(s): "ETH" in the last 168 hours.  IMAGING past 24 hours No results found.  PHYSICAL EXAM General: Patient with global aphasia resting in bed with eyes open in no acute distress Respiratory: Regular, unlabored respirations on room air Neurologic: Awake and alert and will track you.  Patient with  global aphasia does not respond to name but intermittently follows  midline and son left body commands .  He blinks to threat on the left but not on the right and has a left gaze preference but can cross midline.  Right facial droop noted, moves left upper and lower extremities spontaneously and will withdraw to right upper and lower extremities to noxious stimuli.  ASSESSMENT/PLAN Mr. KEEN BOSO is a 42 y.o. male with history of diabetes, hypertension, hyperlipidemia and smoking who was initially admitted to [facility] on 6/19 with right hemiplegia and aphasia.  He presented outside the window for TNK and thrombectomy.  He was found to have a left basal ganglia infarct, which is now extended into much of the left MCA territory, and he also developed a small acute infarct in posterior limb of right internal capsule.  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  Yesterday, he was transferred here to be seen by stroke team and possibly for conventional angiogram given severe stenosis.  Admission urine toxicology was positive for THC.  Patient's grandmother states that he does not often go to the doctor and does smoke cigarettes.  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  Similar petechial hemorrhage.  New interval small acute infarct in the right PLIC CTA head and neck severe, near occlusive stenosis in left M1 with poor perfusion of proximal M2 branches, severe stenosis in left V4, severe stenosis in left  supraclinoid ICA, moderate stenosis in right supraclinoid ICA, multifocal irregularity and mild to moderate stenosis in left V1 and V2 segments, mild stenosis in right V1 and proximal V2 segments Carotid Doppler no significant plaque formation 2D Echo EF 60 to 65%  LDL 265 HgbA1c 11.7 VTE prophylaxis -Lovenox No antithrombotic prior to admission, now on aspirin 81 mg daily and clopidogrel 75 mg daily for at least 3 months due to severe M1 stenosis. Therapy recommendations: CIR Disposition: Pending  ICA and MCA stenosis CT head and neck severe, near occlusive stenosis in left M1 with poor perfusion of proximal M2 branches, severe stenosis in left supraclinoid ICA, moderate stenosis in right supraclinoid ICA Plan for elective elective angiogram with  stenting if patient shows clinical improvement  Hypertension Home meds: None Stable Outside of permissive hypertension window On amlodipine 5 BP goal 140-180 before the angiogram next Tuesday  Long-term BP goal normotensive  Hyperlipidemia Home meds:  Atorvastatin 10 mg daily  LDL 265, goal < 70 Lipitor increased to 80 Continue statin at discharge  Diabetes type II Uncontrolled Home meds: Lantus 30 units daily HgbA1c 11.7, goal < 7.0 CBGs SSI Close PCP follow-up for better DM control  Tobacco abuse Current smoker Smoking cessation counseling will be provided  AKI Creatinine 1.57-1.45-1.57->1.27 On IV fluid and TF CVP monitoring  Dysphagia Did not pass a swallow Currently n.p.o. Core track placed On tube feeding Speech on board  Other Stroke Risk Factors Substance abuse - UDS:  THC POSITIVE. Patient will be advised to stop using due to stroke risk.  Other Active Problems None  Hospital day # 12 Patient continues to have significant global aphasia with dense right hemiplegia.  Continue permissive hypertension and dual antiplatelet therapy.  Continue physical occupational and speech therapy.   Given patient's profound  aphasia and dense hemiplegia I do not believe emergent left MCA stenting would be beneficial unless he shows at least modest improvement.  Hence we will postpone left MCA angioplasty stenting till he shows at least reasonable improvement with rehab.  Long discussion with the grandmother at bedside and with the sister over the phone and answered questions.  Discussed with  Dr. Sherlon Handing neurointerventional radiology..  Greater than 50% time during this 35-minute visit was spent in counseling and coordination of care and discussion with patient and care team and answering questions.D/W Dr Dayna Barker.  Maintain aggressive risk factor modification.  Follow-up as a outpatient in stroke clinic in 2 months   Delia Heady, MD  Stroke Neurology 06/26/2023 1:05 PM      To contact Stroke Continuity provider, please refer to [email or web address]. After hours,  contact General Neurology

## 2023-06-26 NOTE — Progress Notes (Signed)
Inpatient Rehab Admissions Coordinator:   I am following this Pt. For CIR. Per Pt.'s wife they are opting not to have stent placed tomorrow as originally planned. We had insurance authorization last week but this has expired. I have placed a call to case manager with his insurance to see if auth dates can be extended.   Megan Salon, MS, CCC-SLP Rehab Admissions Coordinator  [phone] (celll) [phone] (office)

## 2023-06-26 NOTE — Significant Event (Signed)
Patient tolerated getting out of bed and sitting in recliner this afternoon; returned to bed as patient was starting to get restless.

## 2023-06-26 NOTE — Progress Notes (Signed)
Speech Language Pathology Treatment: Dysphagia (communication)  Patient Details Name: Zachary Winters MRN: [id number] DOB: 1981/11/09 Today's Date: 06/26/2023 Time: 9562-1308 SLP Time Calculation (min) (ACUTE ONLY): 23 min  Assessment / Plan / Recommendation Clinical Impression  Pt was seen for treatment. He was self-feeding breakfast upon SLP's arrival. He was alert and cooperative during the session. Verbal output was increased compared to when he was last seen by this SLP. Articulatory precision was moderately reduced and negatively impacted speech intelligibility. Pt produced automatic sequences (numbers and days of the week) with phonemic and/or part-word cues. He achieved 40% accuracy with confrontational naming given phonemic and part-word cues. Pt repeated monosyllabic words with 100% accuracy. To increase kinesthetic awareness and spatial targeting, repetition of words with increasing length was attempted, but pt was unable to complete this tasks despite verbal prompts and visual placement cues. Pt refused puree boluses, but tolerated the majority of honey thick liquids via cup and spoon without overt s/s of aspiration. A single instance of coughing was noted which appeared to be related to a suspected pharyngeal delay. Right-sided anterior spillage was observed, but was improved compared to 6/28. Pt's current diet of dysphagia 1 solids and honey thick liquids will be continued. SLP will continue to follow pt.     HPI HPI: Pt is a 42 year old male who presented with right-sided weakness, right-sided facial droop and difficulty speaking. 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 BSE 6/19 with minimal R anterior spillage, decreased coordination and R anterior spillage; dysphagia 2 diet with thin liquids recommended. MRI 6/24 due to new dysphagia symptoms: Interval increase in size of an acute left MCA territory  infarct in the left basal ganglia and overlying frontal lobe, including new/interval infarct in the left frontal lobe. Similar petechial hemorrhage without mass occupying acute hemorrhage. New/interval small acute infarct in the right posterior limb of the internal capsule. MBS 6/24: mod/severe oral phase dysphagia and mild/mod pharyngeal phase dysphagia characterized by weak lingual movement and bolus manipulation with labial spillage, oral holding, and spillover to the pharynx. An NPO status recommended with snacks of puree and honey thick liquids. PMH: diabetes mellitus on insulin, chronic daily smoker, hypertension, history of marijuana use, diabetic peripheral neuropathy, hyperlipidemia      SLP Plan  Continue with current plan of care      Recommendations for follow up therapy are one component of a multi-disciplinary discharge planning process, led by the attending physician.  Recommendations may be updated based on patient status, additional functional criteria and insurance authorization.    Recommendations  Diet recommendations: Dysphagia 1 (puree);Honey-thick liquid Liquids provided via: Cup;Teaspoon Medication Administration: Crushed with puree (or via cortrak) Supervision: Patient able to self feed Compensations: Slow rate;Small sips/bites Postural Changes and/or Swallow Maneuvers: Seated upright 90 degrees                  Oral care QID   Frequent or constant Supervision/Assistance Dysphagia, oropharyngeal phase (R13.12);Aphasia (R47.01);Dysarthria and anarthria (R47.1)     Continue with current plan of care    Carrine Kroboth I. Vear Clock, MS, CCC-SLP Neuro Diagnostic Specialist  Acute Rehabilitation Services Office number: [phone]  Scheryl Marten  06/26/2023, 9:54 AM

## 2023-06-26 NOTE — Progress Notes (Signed)
Zachary Winters  RUE:[id number] DOB: 1981-10-10 DOA: 06/14/2023 PCP: System, Provider Not In  Brief Narrative/Hospital Course: 42 year old gentleman with poorly controlled diabetes mellitus on insulin, chronic daily smoker, hypertension, history of marijuana use, diabetic peripheral neuropathy, hyperlipidemia was sent to the emergency department by [facility] with right-sided weakness right-sided facial droop and difficulty speaking.  He was last known to be well 2 days ago at 6 AM when his sister noticed he went out to smoke on ring camera.  He apparently lives with his sister.  Sister noticed the patient lying on the floor having a difficult time communicating only able to nod yes and no.  She noticed that for the last 2 days he has been lying around and sleeping. In ED: had significant aphasia and right-sided hemiparesis.  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  His blood glucose was 109.  Code Stroke was not activated due to being 48 hours since last known well time.  06/14/23:Admitted at [facility]> MRI brain:acute to subacute perforator infarct at the left basal ganglia and adjacent white matter with petechial hemorrhage. 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 Subsequently underwent TTE, TEE that was negative for hyper coagulable labs ordered seen by neuro PT OT speech, CT chest abdomen pelvis unremarkable and occult malignancy. 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 New/interval small acute infarct in the right posterior limb of the internal capsule> neurology was notified advised to transfer to [facility] for further neurology  management. 06/20/23 night patient arrived at [facility] 06/21/23;Cortrak ordered>  6/28:placed on DYS 1 diet after MBS  Subjective: Seen and examined Alert awake follows commands taught him how to suction himself and abel to do that Still aphasia and unabel to move rt side Grandmother at the bedside  Assessment and Plan: Principal Problem:   Acute stroke due to ischemia [facility]) Active Problems:   Hypertension associated with diabetes (HCC)   Type 2 diabetes mellitus with diabetic neuropathy (HCC)   Hyperlipidemia associated with type 2 diabetes mellitus (HCC)   Dysphonia   Aphasia   Tobacco use disorder   Recreational drug use   Diabetic neuropathy (HCC)   Vitamin D deficiency   Apraxia  Left MCA territory stroke due to L M1 occlusion with is a likely large vessel disease Severe M1 left stenosis: First started in the basal ganglia then extending as well as a small infarct in the posterior limb of right internal capsule S/p stroke work up w/CT head MRI x2,MRA carotid Dopplers, TTE/TEE-see report for results. HbA1c uncontrolled 1.7 LDL uncontrolled 265. Started on Lipitor 80 mg,aspirin 81 Plavix 75 x 3 months for now  as per neurology Transferred to [facility] for neuro eval due to worsening of rt sided weakness and aphasia Possibly ? angiogram with intent of stenting-await neuro plan He continues to have dense right hemiplegia, aphasia S/P MBS and on dysphagia 1 diet on 7/1  We discussed about risk of aspiration- cont suctioning, aspiration precautions- able to do himself  Dysphagia: Continue core track tube feeding , speech eval appreciated s/p MBS 6/28> placed on dysphagia 1 diet .  Hypertension: BP now fairly controlled. Started on new med w/ Amlodipine 10 mg Coreg 6.25, will  cont same.BP goal 140-180 before angiogram.  Hyperlipidemia: LDL 265 started on Atorvastatin 80 mg  T2DM  w/ uncontrolled hyperglycemia A1c 9.7, with diabetic peripheral neuropathy: Appears to be noncompliant,  blood sugar now poorly controlled after initiating tube feeding, continue Semglee 10 units twice daily, add  novolog 4 u q4hr and cont ssi q4hs Recent Labs  Lab 06/25/23 1707 06/25/23 2121 06/25/23 2352 06/26/23 0331 06/26/23 0842  GLUCAP 168* 179* 201* 230* 280*   AKI vs CKD 3a Metabolic acidosis: Creatinine previously 1.0 in 2022 on admission 1.5, remains overall better -discontinue IV fluids and add free water flush Recent Labs    06/14/23 0911 06/15/23 0514 06/16/23 0416 06/21/23 0743 06/22/23 0713 06/23/23 0628 06/24/23 0358 06/25/23 0549 06/26/23 0708  BUN 19 16 14 9 7 9 9 13 15   CREATININE 1.57* 1.49* 1.45* 1.57* 1.27* 1.29* 1.26* 1.37* 1.31*  CO2 27 25 24  19* 23 24 25 27 28     Anemia hemoglobin slowly downtrending monitor no obvious bleeding noted, likely dilutional Recent Labs  Lab 06/21/23 0743 06/26/23 0708  HGB 9.2* 9.2*  HCT 27.9* 28.5*   Hypokalemia resolved  Recent Labs  Lab 06/22/23 0713 06/23/23 0628 06/24/23 0358 06/25/23 0549 06/26/23 0708  K 3.6 3.4* 3.6 3.7 3.8   Severe vitamin D deficiency: Resume weekly high-dose supplement   Tobacco abuse: will need to stop smoking.  Continue scheduled nicotine patch  Recreational drug use UDS positive for marijuana: TOC consult for resources  Deconditioning debility Continue aggressive PT OT speech eval. use restraint on the left side to protect core track tube.  Right upper lobe predominant tree-in-bud pattern suggesting chronic inflammation atypical infection such as MAC: Will refer him out to pulmonary clinic at discharge for further evaluation of this.  At risk of aspiration continue suctioning  CT noted advanced coronary calcifications:Will refer him out to cardiology at discharge for surveillance.   DVT prophylaxis: enoxaparin (LOVENOX) injection 40 mg Start: 06/14/23 1045 Code Status:   Code Status: Full Code Family Communication: plan of care discussed with patient/mother at  bedside. Patient status is:  inpatient because of acute stroke Level of care: Telemetry Medical   Dispo: The patient is from: Home            Anticipated disposition: CIR Objective: Vitals last 24 hrs: Vitals:   06/25/23 2120 06/25/23 2347 06/26/23 0325 06/26/23 0838  BP: (!) 148/100 (!) 175/91 (!) 167/90 (!) 152/90  Pulse: 91 81 94 96  Resp: 18 18 20 17   Temp: 98.4 F (36.9 C) 98.6 F (37 C) 98.2 F (36.8 C) 99.4 F (37.4 C)  TempSrc: Oral Oral Oral Oral  SpO2: 100% 98% 100% 100%  Weight:      Height:       Weight change:  Physical exam General exam: alert awake, oriented , aphasic HEENT:Oral mucosa moist, Ear/Nose WNL grossly Respiratory system: Bilaterally clear BS,no use of accessory muscle Cardiovascular system: S1 & S2 +, No JVD. Gastrointestinal system: Abdomen soft,NT,ND, BS+ Nervous System: Alert, awake, moving left side well unable to move right side, aphasic  Extremities: LE edema neg,distal peripheral pulses palpable and warm.  Skin: No rashes,no icterus. MSK: Normal muscle bulk,tone, power  Ngt+  Medications reviewed:  Scheduled Meds:  amLODipine  10 mg Per NG tube Daily   aspirin  81 mg Per Tube Daily   atorvastatin  80 mg Per Tube QPM   carvedilol  6.25 mg Oral BID WC   cholecalciferol  400 Units Per Tube Daily  clopidogrel  75 mg Per NG tube Daily   enoxaparin (LOVENOX) injection  40 mg Subcutaneous Daily   feeding supplement (GLUCERNA 1.5 CAL)  1,280 mL Per Tube Q24H   free water  150 mL Per Tube Q4H   insulin aspart  0-9 Units Subcutaneous Q4H   insulin glargine-yfgn  10 Units Subcutaneous BID   nicotine  21 mg Transdermal Daily  Continuous Infusions:    Diet Order             DIET - DYS 1 Room service appropriate? No; Fluid consistency: Honey Thick  Diet effective now                 Nutrition Problem: Inadequate oral intake Etiology: dysphagia Signs/Symptoms: NPO status Interventions: Refer to RD note for  recommendations   Intake/Output Summary (Last 24 hours) at 06/26/2023 1019 Last data filed at 06/26/2023 1000 Gross per 24 hour  Intake 3237.11 ml  Output 2050 ml  Net 1187.11 ml   Net IO Since Admission: 6,675.04 mL [06/26/23 1019]  Wt Readings from Last 3 Encounters:  06/24/23 89.4 kg  09/05/21 68 kg  07/31/21 65.3 kg     Unresulted Labs (From admission, onward)     Start     Ordered   06/21/23 0500  Creatinine, serum  (enoxaparin (LOVENOX)    CrCl >/= 30 ml/min)  Weekly,   R     Comments: while on enoxaparin therapy    06/14/23 1038   06/17/23 0500  Factor 5 leiden  (Hypercoagulable Panel, Comprehensive (PNL))  Tomorrow morning,   R        06/16/23 1253   06/17/23 0500  Prothrombin gene mutation  (Hypercoagulable Panel, Comprehensive (PNL))  Tomorrow morning,   R        06/16/23 1253          Data Reviewed: I have personally reviewed following labs and imaging studies CBC: Recent Labs  Lab 06/21/23 0743 06/26/23 0708  WBC 9.8 10.5  HGB 9.2* 9.2*  HCT 27.9* 28.5*  MCV 88.9 89.9  PLT 252 270   Basic Metabolic Panel: Recent Labs  Lab 06/21/23 1721 06/22/23 0713 06/22/23 1631 06/23/23 0628 06/24/23 0358 06/25/23 0549 06/26/23 0708  NA  --  143  --  143 143 140 141  K  --  3.6  --  3.4* 3.6 3.7 3.8  CL  --  112*  --  109 110 105 104  CO2  --  23  --  24 25 27 28   GLUCOSE  --  136*  --  236* 199* 291* 275*  BUN  --  7  --  9 9 13 15   CREATININE  --  1.27*  --  1.29* 1.26* 1.37* 1.31*  CALCIUM  --  8.6*  --  8.6* 8.5* 8.6* 8.4*  MG 2.0 1.8 1.8 1.7  --   --   --   PHOS 2.8 2.7 2.9 3.8  --   --   --    Recent Labs  Lab 06/25/23 1707 06/25/23 2121 06/25/23 2352 06/26/23 0331 06/26/23 0842  GLUCAP 168* 179* 201* 230* 280*  No results found for this or any previous visit (from the past 240 hour(s)).  Antimicrobials: Anti-infectives (From admission, onward)    None      Culture/Microbiology    Component Value Date/Time   SDES  03/14/2021 1728     IN/OUT CATH URINE Performed at [facility], [address], [place], [place] [address]  SPECREQUEST  03/14/2021 1728    NONE Performed at [facility], [address] [place], [place] [address]    CULT MULTIPLE SPECIES PRESENT, SUGGEST RECOLLECTION (A) 03/14/2021 1728   REPTSTATUS 03/16/2021 FINAL 03/14/2021 1728    Radiology Studies: No results found.   LOS: 12 days   Lanae Boast, MD [facility]  06/26/2023, 10:19 AM

## 2023-06-26 NOTE — Plan of Care (Signed)
  Problem: Activity: Goal: Risk for activity intolerance will decrease Outcome: Progressing   Problem: Nutrition: Goal: Adequate nutrition will be maintained Outcome: Progressing   

## 2023-06-26 NOTE — TOC Progression Note (Signed)
Transition of Care [facility]) - Progression Note    Patient Details  Name: Zachary Winters MRN: [id number] Date of Birth: March 10, 1981  Transition of Care [facility]) CM/SW Contact  Kermit Balo, RN Phone Number: 06/26/2023, 2:27 PM  Clinical Narrative:    Stenting on  hold. CIR attempting to extend the auth for CIR.  TOC following.   Expected Discharge Plan: IP Rehab Facility Barriers to Discharge: Continued Medical Work up  Expected Discharge Plan and Services                                               Social Determinants of Health (SDOH) Interventions SDOH Screenings   Food Insecurity: No Food Insecurity (10/31/2019)  Transportation Needs: No Transportation Needs (10/31/2019)  Depression (PHQ2-9): Medium Risk (01/21/2021)  Financial Resource Strain: Low Risk  (10/31/2019)  Physical Activity: Sufficiently Active (10/31/2019)  Social Connections: Socially Isolated (10/31/2019)  Stress: No Stress Concern Present (10/31/2019)  Tobacco Use: High Risk (06/20/2023)    Readmission Risk Interventions     No data to display

## 2023-06-27 ENCOUNTER — Inpatient Hospital Stay (HOSPITAL_COMMUNITY): Payer: Medicaid Other

## 2023-06-27 ENCOUNTER — Inpatient Hospital Stay (HOSPITAL_COMMUNITY)
Admission: RE | Admit: 2023-06-27 | Discharge: 2023-07-25 | DRG: 057 | Disposition: A | Discharge status: Home Health | Payer: Medicaid Other | Source: Intra-hospital | Attending: Physical Medicine & Rehabilitation | Admitting: Physical Medicine & Rehabilitation

## 2023-06-27 ENCOUNTER — Other Ambulatory Visit (HOSPITAL_COMMUNITY): Payer: Medicaid Other

## 2023-06-27 DIAGNOSIS — R159 Full incontinence of feces: Secondary | ICD-10-CM | POA: Diagnosis present

## 2023-06-27 DIAGNOSIS — E1140 Type 2 diabetes mellitus with diabetic neuropathy, unspecified: Secondary | ICD-10-CM | POA: Diagnosis not present

## 2023-06-27 DIAGNOSIS — I69322 Dysarthria following cerebral infarction: Secondary | ICD-10-CM | POA: Diagnosis not present

## 2023-06-27 DIAGNOSIS — R0981 Nasal congestion: Secondary | ICD-10-CM | POA: Diagnosis present

## 2023-06-27 DIAGNOSIS — E1069 Type 1 diabetes mellitus with other specified complication: Secondary | ICD-10-CM | POA: Diagnosis present

## 2023-06-27 DIAGNOSIS — I69320 Aphasia following cerebral infarction: Secondary | ICD-10-CM

## 2023-06-27 DIAGNOSIS — I69351 Hemiplegia and hemiparesis following cerebral infarction affecting right dominant side: Principal | ICD-10-CM

## 2023-06-27 DIAGNOSIS — R32 Unspecified urinary incontinence: Secondary | ICD-10-CM | POA: Diagnosis present

## 2023-06-27 DIAGNOSIS — I63512 Cerebral infarction due to unspecified occlusion or stenosis of left middle cerebral artery: Secondary | ICD-10-CM

## 2023-06-27 DIAGNOSIS — Z87898 Personal history of other specified conditions: Secondary | ICD-10-CM

## 2023-06-27 DIAGNOSIS — Z79899 Other long term (current) drug therapy: Secondary | ICD-10-CM

## 2023-06-27 DIAGNOSIS — R1311 Dysphagia, oral phase: Secondary | ICD-10-CM | POA: Diagnosis present

## 2023-06-27 DIAGNOSIS — Z880 Allergy status to penicillin: Secondary | ICD-10-CM | POA: Diagnosis not present

## 2023-06-27 DIAGNOSIS — I639 Cerebral infarction, unspecified: Secondary | ICD-10-CM | POA: Diagnosis present

## 2023-06-27 DIAGNOSIS — I129 Hypertensive chronic kidney disease with stage 1 through stage 4 chronic kidney disease, or unspecified chronic kidney disease: Secondary | ICD-10-CM | POA: Diagnosis present

## 2023-06-27 DIAGNOSIS — D62 Acute posthemorrhagic anemia: Secondary | ICD-10-CM | POA: Diagnosis present

## 2023-06-27 DIAGNOSIS — E1169 Type 2 diabetes mellitus with other specified complication: Secondary | ICD-10-CM | POA: Diagnosis present

## 2023-06-27 DIAGNOSIS — R482 Apraxia: Secondary | ICD-10-CM

## 2023-06-27 DIAGNOSIS — E1042 Type 1 diabetes mellitus with diabetic polyneuropathy: Secondary | ICD-10-CM | POA: Diagnosis present

## 2023-06-27 DIAGNOSIS — I69359 Hemiplegia and hemiparesis following cerebral infarction affecting unspecified side: Secondary | ICD-10-CM | POA: Diagnosis not present

## 2023-06-27 DIAGNOSIS — G8111 Spastic hemiplegia affecting right dominant side: Secondary | ICD-10-CM | POA: Diagnosis not present

## 2023-06-27 DIAGNOSIS — Z794 Long term (current) use of insulin: Secondary | ICD-10-CM | POA: Diagnosis not present

## 2023-06-27 DIAGNOSIS — E559 Vitamin D deficiency, unspecified: Secondary | ICD-10-CM | POA: Diagnosis present

## 2023-06-27 DIAGNOSIS — I69391 Dysphagia following cerebral infarction: Secondary | ICD-10-CM | POA: Diagnosis not present

## 2023-06-27 DIAGNOSIS — R509 Fever, unspecified: Secondary | ICD-10-CM | POA: Diagnosis present

## 2023-06-27 DIAGNOSIS — F17210 Nicotine dependence, cigarettes, uncomplicated: Secondary | ICD-10-CM | POA: Diagnosis present

## 2023-06-27 DIAGNOSIS — E1159 Type 2 diabetes mellitus with other circulatory complications: Secondary | ICD-10-CM | POA: Diagnosis present

## 2023-06-27 DIAGNOSIS — E1165 Type 2 diabetes mellitus with hyperglycemia: Secondary | ICD-10-CM | POA: Diagnosis present

## 2023-06-27 DIAGNOSIS — R1313 Dysphagia, pharyngeal phase: Secondary | ICD-10-CM | POA: Diagnosis present

## 2023-06-27 DIAGNOSIS — I10 Essential (primary) hypertension: Secondary | ICD-10-CM | POA: Diagnosis present

## 2023-06-27 DIAGNOSIS — Z888 Allergy status to other drugs, medicaments and biological substances status: Secondary | ICD-10-CM

## 2023-06-27 DIAGNOSIS — E860 Dehydration: Secondary | ICD-10-CM | POA: Diagnosis present

## 2023-06-27 DIAGNOSIS — R4701 Aphasia: Secondary | ICD-10-CM | POA: Diagnosis present

## 2023-06-27 DIAGNOSIS — E785 Hyperlipidemia, unspecified: Secondary | ICD-10-CM | POA: Diagnosis present

## 2023-06-27 DIAGNOSIS — R1312 Dysphagia, oropharyngeal phase: Secondary | ICD-10-CM | POA: Diagnosis not present

## 2023-06-27 DIAGNOSIS — E119 Type 2 diabetes mellitus without complications: Secondary | ICD-10-CM | POA: Diagnosis not present

## 2023-06-27 DIAGNOSIS — R252 Cramp and spasm: Secondary | ICD-10-CM | POA: Diagnosis not present

## 2023-06-27 HISTORY — DX: Nausea with vomiting, unspecified: R11.2

## 2023-06-27 HISTORY — DX: Polyneuropathy, unspecified: G62.9

## 2023-06-27 HISTORY — DX: Type 2 diabetes mellitus with unspecified diabetic retinopathy with macular edema: E11.311

## 2023-06-27 HISTORY — DX: Type 1 diabetes mellitus without complications: E10.9

## 2023-06-27 LAB — GLUCOSE, CAPILLARY
Glucose-Capillary: 117 mg/dL — ABNORMAL HIGH (ref 70–99)
Glucose-Capillary: 122 mg/dL — ABNORMAL HIGH (ref 70–99)
Glucose-Capillary: 136 mg/dL — ABNORMAL HIGH (ref 70–99)
Glucose-Capillary: 174 mg/dL — ABNORMAL HIGH (ref 70–99)
Glucose-Capillary: 246 mg/dL — ABNORMAL HIGH (ref 70–99)
Glucose-Capillary: 257 mg/dL — ABNORMAL HIGH (ref 70–99)
Glucose-Capillary: 93 mg/dL (ref 70–99)

## 2023-06-27 SURGERY — IR WITH ANESTHESIA
Anesthesia: General

## 2023-06-27 MED ORDER — SODIUM CHLORIDE 0.9 % IV SOLN: INTRAVENOUS | Status: DC | PRN

## 2023-06-27 MED ORDER — CLOPIDOGREL BISULFATE 75 MG PO TABS
75.0000 mg | ORAL_TABLET | Freq: Every day | ORAL | Status: DC
  Administered 2023-06-28 – 2023-07-12 (×15): 75 mg via NASOGASTRIC
  Filled 2023-06-27 (×15): qty 1

## 2023-06-27 MED ORDER — ASPIRIN 81 MG PO CHEW: 81.0000 mg | CHEWABLE_TABLET | Freq: Every day | ORAL | Status: DC

## 2023-06-27 MED ORDER — INSULIN GLARGINE-YFGN 100 UNIT/ML ~~LOC~~ SOLN
5.0000 [IU] | Freq: Every day | SUBCUTANEOUS | Status: DC
  Administered 2023-06-28: 5 [IU] via SUBCUTANEOUS
  Filled 2023-06-27 (×2): qty 0.05

## 2023-06-27 MED ORDER — GLUCERNA SHAKE PO LIQD
237.0000 mL | Freq: Three times a day (TID) | ORAL | Status: DC
  Administered 2023-06-27 – 2023-06-28 (×2): 237 mL via ORAL

## 2023-06-27 MED ORDER — DIPHENHYDRAMINE HCL 25 MG PO CAPS: 25.0000 mg | ORAL_CAPSULE | Freq: Four times a day (QID) | ORAL | Status: DC | PRN

## 2023-06-27 MED ORDER — NICOTINE 21 MG/24HR TD PT24
21.0000 mg | MEDICATED_PATCH | Freq: Every day | TRANSDERMAL | Status: DC
  Administered 2023-06-28 – 2023-07-25 (×28): 21 mg via TRANSDERMAL
  Filled 2023-06-27 (×28): qty 1

## 2023-06-27 MED ORDER — BISACODYL 10 MG RE SUPP: 10.0000 mg | Freq: Every day | RECTAL | 0 refills | Status: DC | PRN

## 2023-06-27 MED ORDER — INSULIN GLARGINE-YFGN 100 UNIT/ML ~~LOC~~ SOLN: 5.0000 [IU] | Freq: Every day | SUBCUTANEOUS | 11 refills | Status: DC

## 2023-06-27 MED ORDER — ACETAMINOPHEN 325 MG PO TABS
325.0000 mg | ORAL_TABLET | ORAL | Status: DC | PRN
  Administered 2023-06-28 – 2023-07-14 (×7): 650 mg via ORAL
  Filled 2023-06-27 (×8): qty 2

## 2023-06-27 MED ORDER — GLUCERNA PO LIQD: 237.0000 mL | Freq: Three times a day (TID) | ORAL | Status: DC

## 2023-06-27 MED ORDER — VITAMIN D (ERGOCALCIFEROL) 1.25 MG (50000 UNIT) PO CAPS
50000.0000 [IU] | ORAL_CAPSULE | ORAL | Status: DC
  Administered 2023-07-05 – 2023-07-19 (×3): 50000 [IU] via ORAL
  Filled 2023-06-27 (×4): qty 1

## 2023-06-27 MED ORDER — ALUM & MAG HYDROXIDE-SIMETH 200-200-20 MG/5ML PO SUSP: 30.0000 mL | ORAL | Status: DC | PRN

## 2023-06-27 MED ORDER — CARVEDILOL 6.25 MG PO TABS
6.2500 mg | ORAL_TABLET | Freq: Two times a day (BID) | ORAL | Status: DC
  Administered 2023-06-28 – 2023-07-25 (×55): 6.25 mg via ORAL
  Filled 2023-06-27 (×56): qty 1

## 2023-06-27 MED ORDER — GUAIFENESIN-DM 100-10 MG/5ML PO SYRP: 5.0000 mL | ORAL_SOLUTION | Freq: Four times a day (QID) | ORAL | Status: DC | PRN

## 2023-06-27 MED ORDER — BISACODYL 10 MG RE SUPP: 10.0000 mg | Freq: Every day | RECTAL | Status: DC | PRN

## 2023-06-27 MED ORDER — FLUTICASONE PROPIONATE 50 MCG/ACT NA SUSP
2.0000 | Freq: Every day | NASAL | Status: DC
  Administered 2023-06-27 – 2023-07-23 (×11): 2 via NASAL
  Filled 2023-06-27: qty 16

## 2023-06-27 MED ORDER — SENNOSIDES-DOCUSATE SODIUM 8.6-50 MG PO TABS
1.0000 | ORAL_TABLET | Freq: Every day | ORAL | Status: DC
  Administered 2023-06-27 – 2023-07-24 (×23): 1 via ORAL
  Filled 2023-06-27 (×24): qty 1

## 2023-06-27 MED ORDER — IPRATROPIUM-ALBUTEROL 0.5-2.5 (3) MG/3ML IN SOLN: 3.0000 mL | RESPIRATORY_TRACT | Status: DC | PRN

## 2023-06-27 MED ORDER — CARVEDILOL 6.25 MG PO TABS: 6.2500 mg | ORAL_TABLET | Freq: Two times a day (BID) | ORAL | Status: DC

## 2023-06-27 MED ORDER — ATORVASTATIN CALCIUM 80 MG PO TABS: 80.0000 mg | ORAL_TABLET | Freq: Every day | ORAL | Status: DC

## 2023-06-27 MED ORDER — INSULIN GLARGINE-YFGN 100 UNIT/ML ~~LOC~~ SOLN: 5.0000 [IU] | Freq: Every day | SUBCUTANEOUS | Status: DC

## 2023-06-27 MED ORDER — AMLODIPINE BESYLATE 10 MG PO TABS: 10.0000 mg | ORAL_TABLET | Freq: Every day | ORAL | Status: DC

## 2023-06-27 MED ORDER — AMLODIPINE BESYLATE 10 MG PO TABS
10.0000 mg | ORAL_TABLET | Freq: Every day | ORAL | Status: DC
  Administered 2023-06-28 – 2023-07-25 (×28): 10 mg via ORAL
  Filled 2023-06-27 (×28): qty 1

## 2023-06-27 MED ORDER — ATORVASTATIN CALCIUM 80 MG PO TABS
80.0000 mg | ORAL_TABLET | Freq: Every evening | ORAL | Status: DC
  Administered 2023-06-28 – 2023-07-24 (×27): 80 mg via ORAL
  Filled 2023-06-27 (×27): qty 1

## 2023-06-27 MED ORDER — SENNOSIDES-DOCUSATE SODIUM 8.6-50 MG PO TABS: 1.0000 | ORAL_TABLET | Freq: Every evening | ORAL | Status: DC | PRN

## 2023-06-27 MED ORDER — ASPIRIN 81 MG PO CHEW
81.0000 mg | CHEWABLE_TABLET | Freq: Every day | ORAL | Status: DC
  Administered 2023-06-28 – 2023-07-12 (×15): 81 mg
  Filled 2023-06-27 (×15): qty 1

## 2023-06-27 MED ORDER — FLEET ENEMA 7-19 GM/118ML RE ENEM: 1.0000 | ENEMA | Freq: Once | RECTAL | Status: DC | PRN

## 2023-06-27 MED ORDER — VITAMIN D (ERGOCALCIFEROL) 1.25 MG (50000 UNIT) PO CAPS: 50000.0000 [IU] | ORAL_CAPSULE | ORAL | Status: DC

## 2023-06-27 MED ORDER — NICOTINE 21 MG/24HR TD PT24: 21.0000 mg | MEDICATED_PATCH | Freq: Every day | TRANSDERMAL | 0 refills | Status: DC

## 2023-06-27 MED ORDER — CLOPIDOGREL BISULFATE 75 MG PO TABS: 75.0000 mg | ORAL_TABLET | Freq: Every day | ORAL | Status: DC

## 2023-06-27 MED ORDER — TRAZODONE HCL 50 MG PO TABS
25.0000 mg | ORAL_TABLET | Freq: Every evening | ORAL | Status: DC | PRN
  Administered 2023-07-05: 25 mg via ORAL
  Filled 2023-06-27 (×3): qty 1

## 2023-06-27 MED ORDER — ENOXAPARIN SODIUM 40 MG/0.4ML IJ SOSY
40.0000 mg | PREFILLED_SYRINGE | INTRAMUSCULAR | Status: DC
  Administered 2023-06-28 – 2023-07-25 (×28): 40 mg via SUBCUTANEOUS
  Filled 2023-06-27 (×30): qty 0.4

## 2023-06-27 NOTE — Progress Notes (Signed)
PMR Admission Coordinator Pre-Admission Assessment   Patient: Zachary Winters is an 42 y.o., male MRN: [id number] DOB: [birth year] Height: 6\' 1"  (185.4 cm) Weight: 68 kg   Insurance Information HMO:     PPO:      PCP:      IPA:      80/20:      OTHER:  PRIMARY: [place] [employer]BS [employer]      Policy#: [id number]      Subscriber: pt CM Name: Lynden Ang      Phone#: [phone]     Fax#: [phone] Pre-Cert#: [id number] approved 6/24 for admit 06/27/23-07/03/23 with updates due 07/03/23 Employer:  Benefits:  Phone #: [phone]     Name: 6/21 Eff. Date: 11/25/22 until 11/25/23     Deduct: none      Out of Pocket Max: none      Life Max: none CIR: 100 % per medicaid      SNF: per medicaid Outpatient: per medicaid     Co-Pay:  Home Health: per medicaid      Co-Pay:  DME: per medicaid     Co-Pay:  Providers: in network  SECONDARY: none   Financial Counselor:       Phone#:    The Data processing manager" for patients in Inpatient Rehabilitation Facilities with attached "Privacy Act Statement-Health Care Records" was provided and verbally reviewed with: N/A   Emergency Contact Information Contact Information       Name Relation Home Work Mobile    [place] Sister     [phone]    Deanna Artis Father     [phone]    Elliot Dally     [phone]         Current Medical History  Patient Admitting Diagnosis: CVA   History of Present Illness: 42 year old amle with history of poorly controlled DM on insulin, smoker, HTN, h/o marijuana use, DM peripheral neuropathy, HLD who presented on 06/14/23 to [facility] with ridight sided weakness, right facial droop and difficulty speaking. Last known well 2 days ago.    CT scan showed moderate sized subacute infarct in the left basal ganglia/corona radiata with probable petechial hemorrhage. 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 SLP evaled with Dysphagia 2 diet.  Neurology consulted and recommended ASA and Plavix for 90 days followed by Jonne Ply alone. Also CT chest/ABD/Pelvis to rule out occult malignancy. CT was negative for sign of malignancy but also reported right upper lobe predominant tree-in-bud patterns suggesting chronic inflammation atypical infection such as MAC. Will refer to OP pulmonary clinic. Also noted advanced coronary calcification and to f/u OP with cardiology.    Hgb A1c 11. SSI and CBGS. Essential HTN added losartan, metoprolol and amlodipine. Atorvastin ordered for HLD. Severe vitamin D deficiency with Drisdol added. Lovenox for DVT prophylaxis. TEE 6/21 EF 55 to 60%. Moderate concentric left ventricular hypertrophy. Trivial mitral valve regurgitation. Bubble study negative.    On 6/24 Neurology reconuslted due to worsening swallowing. Repeat MRI showed an increase of as acute Left MCA infarct in the left basal ganglia and overlying frontal lobe, including new/interval infarct in the left frontal lobe. Similar petechial hemorrhage without mass occupying acute hemorrhage. New small acute infarct in the right posterior limb of the internal capsule. Pt transferred to [facility] on 06/20/23  for planned stent placement, but family ultimately opted not to have stent done. Coretrak placed 06/21/23 and placed was placed on dysphagia 1 diet with honey  thick liquids following MBSS 6/28. On 7/1 core track got dislodged after episode of vomiting and coughing. Pt. Was seen by PT/OT/SLP throughout admission and they recommended CIR to assist return to PLOF.      Complete NIHSS TOTAL: 13   Patient's medical record from [facility] has been reviewed by the rehabilitation admission coordinator and physician.   Past Medical History      Past Medical History:  Diagnosis Date   Diabetes mellitus without complication (HCC)      Has the patient had major surgery during 100 days prior to admission? Yes   Family History   family history is not on file.    Current Medications   Current Facility-Administered Medications:    0.9 %  sodium chloride infusion, , Intravenous, Continuous, Johnson, Clanford L, MD, Last Rate: 125 mL/hr at 06/19/23 1347, New Bag at 06/19/23 1347   acetaminophen (TYLENOL) tablet 650 mg, 650 mg, Oral, Q4H PRN, 650 mg at 06/15/23 0035 **OR** acetaminophen (TYLENOL) 160 MG/5ML solution 650 mg, 650 mg, Per Tube, Q4H PRN **OR** acetaminophen (TYLENOL) suppository 650 mg, 650 mg, Rectal, Q4H PRN, Johnson, Clanford L, MD   aspirin EC tablet 81 mg, 81 mg, Oral, Daily, Johnson, Clanford L, MD, 81 mg at 06/19/23 1013   atorvastatin (LIPITOR) tablet 80 mg, 80 mg, Oral, QPM, Johnson, Clanford L, MD, 80 mg at 06/18/23 1732   clopidogrel (PLAVIX) tablet 75 mg, 75 mg, Oral, Daily, Johnson, Clanford L, MD, 75 mg at 06/19/23 1012   enoxaparin (LOVENOX) injection 40 mg, 40 mg, Subcutaneous, Daily, Johnson, Clanford L, MD, 40 mg at 06/19/23 1215   insulin aspart (novoLOG) injection 0-9 Units, 0-9 Units, Subcutaneous, Q6H, Johnson, Clanford L, MD   nicotine (NICODERM CQ - dosed in mg/24 hours) patch 21 mg, 21 mg, Transdermal, Daily PRN, Johnson, Clanford L, MD   ondansetron (ZOFRAN) injection 4 mg, 4 mg, Intravenous, Q6H PRN, Johnson, Clanford L, MD, 4 mg at 06/17/23 1713   prochlorperazine (COMPAZINE) injection 10 mg, 10 mg, Intravenous, Q4H PRN, Laural Benes, Clanford L, MD, 10 mg at 06/17/23 2135   senna-docusate (Senokot-S) tablet 1 tablet, 1 tablet, Oral, QHS PRN, Laural Benes, Clanford L, MD   [START ON 06/22/2023] Vitamin D (Ergocalciferol) (DRISDOL) 1.25 MG (50000 UNIT) capsule 50,000 Units, 50,000 Units, Oral, Q7 days, Johnson, Clanford L, MD   Patients Current Diet:  Diet Order                  Diet NPO time specified  Diet effective now                       Precautions / Restrictions Precautions Precautions: Fall Restrictions Weight Bearing Restrictions: No    Has the patient had 2 or more falls or a fall with injury in the past  year? No   Prior Activity Level Limited Community (1-2x/wk): independent   Prior Functional Level Self Care: Did the patient need help bathing, dressing, using the toilet or eating? Independent   Indoor Mobility: Did the patient need assistance with walking from room to room (with or without device)? Independent   Stairs: Did the patient need assistance with internal or external stairs (with or without device)? Independent   Functional Cognition: Did the patient need help planning regular tasks such as shopping or remembering to take medications? Independent   Patient Information Are you of Hispanic, Latino/a,or Spanish origin?: A. No, not of Hispanic, Latino/a, or Spanish origin, X. Patient unable to respond (per sister)  What is your race?: B. Black or African American, X. Patient unable to respond (per sister) Do you need or want an interpreter to communicate with a doctor or health care staff?: 9. Unable to respond (no per sister)   Patient's Response To:  Health Literacy and Transportation Is the patient able to respond to health literacy and transportation needs?: No Health Literacy - How often do you need to have someone help you when you read instructions, pamphlets, or other written material from your doctor or pharmacy?: Patient unable to respond In the past 12 months, has lack of transportation kept you from medical appointments or from getting medications?: No (per sister) In the past 12 months, has lack of transportation kept you from meetings, work, or from getting things needed for daily living?: No (per sister)   Journalist, newspaper / Equipment Home Assistive Devices/Equipment: CBG Meter Home Equipment: None   Prior Device Use: Indicate devices/aids used by the patient prior to current illness, exacerbation or injury? None of the above   Current Functional Level Cognition   Arousal/Alertness: Awake/alert Overall Cognitive Status: Within Functional Limits for tasks  assessed Orientation Level: Oriented to person General Comments: Able to follow commands well. Able to answer yes and no questions for history.    Extremity Assessment (includes Sensation/Coordination)   Upper Extremity Assessment: Defer to OT evaluation RUE Deficits / Details: Minimal tone in R shoulder and elbow. Flaccid with no active movement of any kind in R UE. RUE Sensation: WNL RUE Coordination: decreased fine motor, decreased gross motor  Lower Extremity Assessment: Generalized weakness, RLE deficits/detail RLE Deficits / Details: grossly -3/5, except ankle dorsiflexion 0/5, stiffnes, spastic like movement of RLE RLE Sensation: decreased light touch, decreased proprioception RLE Coordination: decreased fine motor, decreased gross motor     ADLs   Overall ADL's : Needs assistance/impaired Eating/Feeding: Moderate assistance, Minimal assistance, Sitting Grooming: Moderate assistance, Sitting Upper Body Bathing: Moderate assistance, Sitting Lower Body Bathing: Maximal assistance, Sitting/lateral leans, Bed level Upper Body Dressing : Moderate assistance, Sitting Lower Body Dressing: Maximal assistance, Sitting/lateral leans Toilet Transfer: Moderate assistance, Maximal assistance, Stand-pivot, Rolling walker (2 wheels) Toilet Transfer Details (indicate cue type and reason): Simulated via EOB to chair transfer and sit to stand from chair. Toileting- Clothing Manipulation and Hygiene: Total assistance, Maximal assistance, Bed level Tub/ Shower Transfer: Moderate assistance, Maximal assistance, Rolling walker (2 wheels) Functional mobility during ADLs: Maximal assistance, Moderate assistance, Rolling walker (2 wheels) General ADL Comments: Pt able to ambulate one step forward from chair with RW.     Mobility   Overal bed mobility: Needs Assistance Bed Mobility: Supine to Sit Supine to sit: Mod assist General bed mobility comments: increased time, diffiuclty moving RLE, unable to  use RUE     Transfers   Overall transfer level: Needs assistance Equipment used: Hemi-walker Transfers: Sit to/from Stand, Bed to chair/wheelchair/BSC Sit to Stand: Mod assist Bed to/from chair/wheelchair/BSC transfer type:: Step pivot Step pivot transfers: Mod assist General transfer comment: able to hold onto hemiw-walker with left hand during sit to stand, but chose use armrest of chair for completing transfer     Ambulation / Gait / Stairs / Wheelchair Mobility   Ambulation/Gait Ambulation/Gait assistance: Max Chemical engineer (Feet): 2 Feet Assistive device: Hemi-walker Gait Pattern/deviations: Decreased step length - right, Decreased stance time - right, Decreased step length - left, Knees buckling, Decreased stride length, Antalgic, Ataxic, Shuffle General Gait Details: limited to a couple of slow labored unsteady side steps with  difficulty advancing RLE due to weak and stiff Gait velocity: slow     Posture / Balance Dynamic Sitting Balance Sitting balance - Comments: fair/poor seated at EOB Balance Overall balance assessment: Needs assistance Sitting-balance support: Feet supported, No upper extremity supported Sitting balance-Leahy Scale: Poor Sitting balance - Comments: fair/poor seated at EOB Postural control: Right lateral lean, Posterior lean Standing balance support: Reliant on assistive device for balance, During functional activity, Single extremity supported Standing balance-Leahy Scale: Poor Standing balance comment: uisng Hemi-walker     Special needs/care consideration Smoker Hgb A1c 11    Previous Home Environment  Living Arrangements:  (lives with sister, her spouse and 29 yo child)  Lives With: Family Available Help at Discharge:  (sister's StepDad, can provide care when she is working) Type of Home: House Home Layout: One level Home Access: Level entry [employer] Shower/Tub: Health visitor: Standard Bathroom Accessibility: Yes How  Accessible: Accessible via walker, Accessible via wheelchair Home Care Services: No   Discharge Living Setting Plans for Discharge Living Setting: Lives with (comment) (sister and her family) Type of Home at Discharge: House Discharge Home Layout: One level Discharge Home Access: Level entry Discharge Bathroom Shower/Tub: Walk-in shower Discharge Bathroom Toilet: Standard Discharge Bathroom Accessibility: Yes How Accessible: Accessible via walker Does the patient have any problems obtaining your medications?: No   Social/Family/Support Systems Contact Information: sister, Scientist, forensic Anticipated Caregiver: sister adn sister's StepDad, Andrey Campanile when sister works Anticipated Industrial/product designer Information: see contacts Ability/Limitations of Caregiver: sister works Medical laboratory scientific officer: 24/7 Discharge Plan Discussed with Primary Caregiver: Yes Is Caregiver In Agreement with Plan?: Yes Does Caregiver/Family have Issues with Lodging/Transportation while Pt is in Rehab?: Yes   Goals Patient/Family Goal for Rehab: supervision to min asisst with PT, OT and SLP Expected length of stay: ELOS 10 to 14 days Pt/Family Agrees to Admission and willing to participate: Yes Program Orientation Provided & Reviewed with Pt/Caregiver Including Roles  & Responsibilities: Yes   Decrease burden of Care through IP rehab admission: n/a   Possible need for SNF placement upon discharge: not anticipated   Patient Condition: I have reviewed medical records from [facility], spoken with  family member. I discussed via phone for inpatient rehabilitation assessment.  Patient will benefit from ongoing PT, OT, and SLP, can actively participate in 3 hours of therapy a day 5 days of the week, and can make measurable gains during the admission.  Patient will also benefit from the coordinated team approach during an Inpatient Acute Rehabilitation admission.  The patient will receive intensive therapy as well as Rehabilitation  physician, nursing, social worker, and care management interventions.  Due to bladder management, bowel management, safety, skin/wound care, disease management, medication administration, pain management, and patient education the patient requires 24 hour a day rehabilitation nursing.  The patient is currently Mod+2 with mobility and basic ADLs.  Discharge setting and therapy post discharge at home with home health is anticipated.  Patient has agreed to participate in the Acute Inpatient Rehabilitation Program and will admit today.   Preadmission Screen Completed By:  Clois Dupes, RN MSN 06/19/2023 2:08 PM ______________________________________________________________________   Discussed status with Dr. Berline Chough on 06/27/23 at 1014 and received approval for admission today.   Admission Coordinator:  Clois Dupes, RN MSN with updates by Megan Salon, MS, CCC-SLP at  1014/Date 06/27/23    Assessment/Plan: Diagnosis: L MCA stroke-  Does the need for close, 24 hr/day Medical supervision in concert with the patient's rehab needs make it  unreasonable for this patient to be served in a less intensive setting? Yes Co-Morbidities requiring supervision/potential complications: global aphasia; dysphagia; dense R hemiplegia Due to bladder management, bowel management, safety, skin/wound care, disease management, medication administration, pain management, and patient education, does the patient require 24 hr/day rehab nursing? Yes Does the patient require coordinated care of a physician, rehab nurse, PT, OT, and SLP to address physical and functional deficits in the context of the above medical diagnosis(es)? Yes Addressing deficits in the following areas: balance, endurance, locomotion, strength, transferring, bowel/bladder control, bathing, dressing, feeding, grooming, toileting, cognition, speech, language, and swallowing Can the patient actively participate in an intensive therapy program of  at least 3 hrs of therapy 5 days a week? Yes The potential for patient to make measurable gains while on inpatient rehab is good and fair Anticipated functional outcomes upon discharge from inpatient rehab: supervision and min assist PT, supervision and min assist OT, supervision and min assist SLP Estimated rehab length of stay to reach the above functional goals is: 14-18 days Anticipated discharge destination: Home 10. Overall Rehab/Functional Prognosis: good and fair     MD Signature:

## 2023-06-27 NOTE — H&P (Signed)
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  Last known well 2 days prior to admission and reports of being on the floor 2 days ago with elevated BS then seeming tired and and lying around.  UDS positive for THC and he was out of window for TNK.  CT head showed moderate size subacute infarct in left basal ganglia with probable Petechial Hemorrhage and mild regional mass effect.  MRI/MRA brain w/o contrast showed acute to subacute perforater infarct at left basal ganglia with underlying severe stenosis v/s segmental occlusion of left M1 segment and Left ICA noted to be smaller than right and neck arterial imaging recommended to rule out proximal stenosis with underfilling.   Carotid dopplers done which was negative for significant ICA stenosis. Pulses bisferiens noted throughout carotid system with question of AV disease. TEE performed 06/21 revealing EF 55-60% with normal LVF and moderate concentric LVH, tricuspid AV and negative for thrombus or IA shunt.    He has had issues with decreased energy level, emesis as well as poor p.o. intake.  Dr. Melynda Ripple felt stroke was likely due to right M1 occlusion and recommended DAPT x 3 months.  CT abdomen/pelvis/chest done to rule out occult  malignancy. This revealed age advanced three vessel coronary artery calcifications as well as atherosclerotic calcifications involving aorta and iliac arteries, predominant RUL tree in bud pattern s/o chronic atypical infection such as MAC and sludge in GB. HIV--NR. TSH-0.766 Hypercoagulopathy panel negative.    He had worsening of swallow  function, left gaze preference and global aphasia with inability to follow commands and was made n.p.o. on 06/23. Repeat MRI brain 06/24 revealing interval increase in size of left MCA infarct including new/interval infarct left frontal lobe and new/interval small acute infarcts in right posterior limb internal capsule and similar petechial hemorrhage.  MBS performed 06/24 showing moderate to severe oral phase dysphagia with mild to moderate pharyngeal phase dysphagia with 1 episode of silent aspiration of nectar liquids and consistent penetration with thins as well as significant cognitive communicative deficits affecting safety and efficient p.o. consumption.  He was made n.p.o and transferred to [facility] on 06/25 for further workup and management. BP goal 140-180 recommended to prevent hypoperfusion due to severe M1 disease and before angiogram.   CTA head/neck done for workup of embolic stroke and showed evolving infarcts in left basal ganglia, left frontal and temporal lobes, and right posterior limb of internal capsule with mass effect on left lateral ventricle and 3 mm left-to-right midline shift.  He was also found to have severe near occlusive stenosis of left M1 with poor perfusion of proximal M2 branches, severe stenosis left V4 proximal to PICA takeoff severe stenosis left supraclinoid ICA, moderate stenosis right supraclinoid ICA and mild to moderate stenosis left V1 and V2 segment with heterogenous opacities dependent in right upper lobe which appeared to be increased compared to 06/15/2023 with question of aspiration versus infection.  Plans were for left MCA stenting today however due to significant neurological deficits, procedure was postponed pending reasonable improvement  after CIR.   He has had issues with coughing due to difficulty managing secretions and core track was placed for nutritional support.  MBS was repeated on 06/28 and patient started on D1 diet with honey liquids with  aspiration precautions as well as recommendations with self-feeding.   His mentation has been improving with increase in verbal output and ability to produce automatic sequences with cues as well as ability to follow some commands.  Blood sugars noted to be poorly controlled due to tube feeds.  He was started on ergocalciferol due to vitamin D deficiency.  KUB done today due to reports of constipation and was negative for significant stool burden. On am prior to admission, he was found by nurse to have copious amount of vomitus with cortak in his mouth therefore core track was DC'd. He has been refusing pureed foods but tolerating thickened liquids per ST notes.   He continues to be limited by dense right hemiplegia requiring right knee blocked at all times your transfers, has right foot lean on standing and is unable to turn head to the left, question of emerging spasticity or tone in RLE, left inattention as well as expressive/receptive aphasia. He was independent PTA and CIR recommended due to functional decline. Family to provide 24/7 assistance after discharge.      By nodding head yes and no- pt denies pain; denies constipation, but not sure of LBM.  Doesn't know that pulled out Cortrak by vomiting, however coughed/sneezed so hard, was projectile while I was in room.      Review of Systems  Unable to perform ROS: Medical condition  All other systems reviewed and are negative.           Past Medical History:  Diagnosis Date   Diabetes mellitus without complication [facility])             Past Surgical History:  Procedure Laterality Date   TEE WITHOUT CARDIOVERSION N/A 06/16/2023    Procedure: TRANSESOPHAGEAL ECHOCARDIOGRAM (TEE);  Surgeon: Jonelle Sidle, MD;  Location: [facility];  Service: Cardiovascular;  Laterality: N/A;      History reviewed. No pertinent family history.     Social History:  reports that he has been smoking cigarettes. He has a 5.00 pack-year smoking history. He has  never used smokeless tobacco. He reports current alcohol use. He reports current drug use. Frequency: 2.00 times per week. Drug: Marijuana.         Allergies  Allergen Reactions   Metformin Nausea And Vomiting   Penicillins Swelling          Medications Prior to Admission  Medication Sig Dispense Refill   insulin glargine (LANTUS) 100 UNIT/ML injection Inject 30 Units into the skin daily.       atorvastatin (LIPITOR) 10 MG tablet Take 1 tablet (10 mg total) by mouth daily. (Patient not taking: Reported on 04/20/2021) 30 tablet 1   blood glucose meter kit and supplies Dispense based on patient and insurance preference. Check blood sugar four times daily as directed. (FOR ICD-10 E10.9, E11.9). 1 each 0   Insulin Syringe-Needle U-100 (GNP INSULIN SYRINGES 31GX5/16") 31G X 5/16" 0.3 ML MISC Use as instructed to inject insulin twice a day. 300 each 3   potassium chloride SA (KLOR-CON) 20 MEQ tablet Take 1 tablet (20 mEq total) by mouth 2 (two) times daily for 7 days. (Patient not taking: Reported on 06/14/2023) 14 tablet 0          Home: Home Living  Family/patient expects to be discharged to:: Private residence Living Arrangements:  (lives with sister, her spouse and 78 yo child) Available Help at Discharge:  (sister's StepDad, can provide care when she is working) Type of Home: House Home Access: Level entry Home Layout: One level Bathroom Shower/Tub: Health visitor: Pharmacist, community: Yes Home Equipment: None  Lives With: Family   Functional History: Prior Function Prior Level of Function : Independent/Modified Independent Mobility Comments: Tourist information centre manager without AD. ADLs Comments: Independent   Functional Status:  Mobility: Bed Mobility Overal bed mobility: Needs Assistance Bed Mobility: Supine to Sit, Sit to Supine, Rolling Rolling: Min assist Supine to sit: Mod assist, HOB elevated Sit to supine: HOB elevated, Mod assist General bed  mobility comments: Min assist for rolling in bed, cues for LUE use on rail. Mod assist for trunk and RLE support in/out of bed with cues for awareness and technique. Transfers Overall transfer level: Needs assistance Equipment used: 2 person hand held assist Transfers: Sit to/from Stand Sit to Stand: Mod assist, +2 physical assistance, +2 safety/equipment Bed to/from chair/wheelchair/BSC transfer type:: Lateral/scoot transfer Step pivot transfers: Mod assist, Max assist  Lateral/Scoot Transfers: Max assist, +2 safety/equipment, +2 physical assistance General transfer comment: Mod assist +2 with hand held support for boost and balance to rise from slightly elevated bed surface x2. (Deferred transfer as pt has been in chair all morning and recently returned to bed before PT arrival.) Pt required Rt knee block at all times. Has Rightward lean. RUE supported. Facilitated leftward shift to find midline with transitions. Unable to turn head to left to target where to reach with LUE. Ambulation/Gait Ambulation/Gait assistance: Max assist Gait Distance (Feet): 2 Feet Assistive device: 1 person hand held assist Gait Pattern/deviations: Decreased step length - right, Decreased stance time - right, Decreased step length - left, Knees buckling, Decreased stride length, Ataxic, Shuffle General Gait Details: unable at this time Gait velocity: slow Pre-gait activities: Stood at EOB and worked on [employer] shifting. Pt able to RLE from ground. Unsure if automatic from spasticity/tone or if he volitionally did this - pt shook his head yes when asked if he was attempting to lift LLE from floor.   ADL: ADL Overall ADL's : Needs assistance/impaired Eating/Feeding: Moderate assistance, Minimal assistance, Sitting Grooming: Moderate assistance, Sitting Upper Body Bathing: Moderate assistance, Sitting Lower Body Bathing: Maximal assistance, Sitting/lateral leans, Bed level Upper Body Dressing : Moderate assistance,  Sitting Lower Body Dressing: Maximal assistance, Sitting/lateral leans Toilet Transfer: Moderate assistance, Maximal assistance, Stand-pivot, Rolling walker (2 wheels) Toilet Transfer Details (indicate cue type and reason): Simulated via EOB to chair transfer and sit to stand from chair. Toileting- Clothing Manipulation and Hygiene: Total assistance, Maximal assistance, Bed level Tub/ Shower Transfer: Moderate assistance, Maximal assistance, Rolling walker (2 wheels) Functional mobility during ADLs: Maximal assistance, Moderate assistance, Rolling walker (2 wheels) General ADL Comments: Pt able to ambulate one step forward from chair with RW.   Cognition: Cognition Overall Cognitive Status: Difficult to assess Arousal/Alertness: Awake/alert Orientation Level: Other (comment) (nods/shakes head in response to questions. Able to say the word "no" clearly) Cognition Arousal/Alertness: Awake/alert Behavior During Therapy: Flat affect Overall Cognitive Status: Difficult to assess Area of Impairment: Safety/judgement, Awareness, Problem solving, Attention, Memory, Following commands Current Attention Level: Sustained Memory: Decreased short-term memory Following Commands: Follows one step commands with increased time, Follows multi-step commands inconsistently, Follows one step commands inconsistently Safety/Judgement: Decreased awareness of safety, Decreased awareness of deficits Awareness: Intellectual Problem Solving: Slow processing,  Decreased initiation, Difficulty sequencing, Requires verbal cues, Requires tactile cues General Comments: Pt nodding head yes/no to respond to questions, fairly consistent, seems to correct himself if given time and asked if yes/no was what he meant. Difficult to assess due to: Impaired communication   Physical Exam: Blood pressure (!) 164/89, pulse 94, temperature 98.7 F (37.1 C), temperature source Oral, resp. rate 19, height 6\' 1"  (1.854 m), weight 88.1  kg, SpO2 99 %. Physical Exam Vitals and nursing note reviewed.  Constitutional:      Appearance: Normal appearance.     Comments: Pt awake, somewhat alert; sitting up in bed; sneezed/coughed so hard, was projectile across room; Cortrak out; NAD- appears sad  HENT:     Head: Normocephalic and atraumatic.     Comments: R facial droop- severe R tongue deviation Sensation on face- cannot tell, but pt says intact?    Right Ear: External ear normal.     Left Ear: External ear normal.     Nose: Congestion and rhinorrhea present.     Mouth/Throat:     Mouth: Mucous membranes are moist.     Pharynx: Oropharyngeal exudate present.  Eyes:     General:        Right eye: No discharge.        Left eye: No discharge.  Cardiovascular:     Rate and Rhythm: Normal rate and regular rhythm.     Heart sounds: Normal heart sounds. No murmur heard.    No gallop.  Pulmonary:     Comments: Upper airway sounds, some sneezing; 1 time large cough- projectile sputum- clear Abdominal:     General: Bowel sounds are normal. There is no distension.     Palpations: Abdomen is soft.     Tenderness: There is no abdominal tenderness.  Genitourinary:    Comments: Condom catheter in place- looks OK Musculoskeletal:     Cervical back: Neck supple. No tenderness.     Comments: LUE 5/5 in Biceps, triceps, WE, and grip RUE biceps 2-/5- otherwise 0/5 LLE- 5/5 in HF/KE/DF and PF RLE- 0/5 in same muscles  Skin:    General: Skin is warm and dry.     Comments: IV L AC fossa- looks OK  Neurological:     Mental Status: He is alert.     Comments: Nods yes and no inconsistently to basic biographic questions. He was able to make some sounds and was able to state "left" and choose name from choice of two. Oral secretions note with attempts at vocalization as well as occasional cough. Lunch tray untouched at bedside.  Right facial weakness with global aphasia. He was unable to follow simple commands without visual/tactile  cues. Unable to point or imitate use of call bell. Noted to have dense right hemiplegia with flexor tone RLE>RUE. Did move eyes to right field to command.   Followed muscle exam for me- with delayed responses Pt insistent intact to light touch in all 4 exremities, but appears to have R sided neglect as well   Psychiatric:     Comments: Sad, flat affect- tearful while I was in room, when asked how he was doing        Lab Results Last 48 Hours        Results for orders placed or performed during the hospital encounter of 06/14/23 (from the past 48 hour(s))  Glucose, capillary     Status: Abnormal    Collection Time: 06/25/23 12:50 PM  Result Value Ref Range  Glucose-Capillary 291 (H) 70 - 99 mg/dL      Comment: Glucose reference range applies only to samples taken after fasting for at least 8 hours.    Comment 1 Notify RN    Glucose, capillary     Status: Abnormal    Collection Time: 06/25/23  5:07 PM  Result Value Ref Range    Glucose-Capillary 168 (H) 70 - 99 mg/dL      Comment: Glucose reference range applies only to samples taken after fasting for at least 8 hours.    Comment 1 Notify RN    Glucose, capillary     Status: Abnormal    Collection Time: 06/25/23  9:21 PM  Result Value Ref Range    Glucose-Capillary 179 (H) 70 - 99 mg/dL      Comment: Glucose reference range applies only to samples taken after fasting for at least 8 hours.  Glucose, capillary     Status: Abnormal    Collection Time: 06/25/23 11:52 PM  Result Value Ref Range    Glucose-Capillary 201 (H) 70 - 99 mg/dL      Comment: Glucose reference range applies only to samples taken after fasting for at least 8 hours.  Glucose, capillary     Status: Abnormal    Collection Time: 06/26/23  3:31 AM  Result Value Ref Range    Glucose-Capillary 230 (H) 70 - 99 mg/dL      Comment: Glucose reference range applies only to samples taken after fasting for at least 8 hours.  Basic metabolic panel     Status: Abnormal     Collection Time: 06/26/23  7:08 AM  Result Value Ref Range    Sodium 141 135 - 145 mmol/L    Potassium 3.8 3.5 - 5.1 mmol/L    Chloride 104 98 - 111 mmol/L    CO2 28 22 - 32 mmol/L    Glucose, Bld 275 (H) 70 - 99 mg/dL      Comment: Glucose reference range applies only to samples taken after fasting for at least 8 hours.    BUN 15 6 - 20 mg/dL    Creatinine, Ser 1.30 (H) 0.61 - 1.24 mg/dL    Calcium 8.4 (L) 8.9 - 10.3 mg/dL    GFR, Estimated >86 >57 mL/min      Comment: (NOTE) Calculated using the CKD-EPI Creatinine Equation (2021)      Anion gap 9 5 - 15      Comment: Performed at [facility], [address], [place], [place] [address]  CBC     Status: Abnormal    Collection Time: 06/26/23  7:08 AM  Result Value Ref Range    WBC 10.5 4.0 - 10.5 K/uL    RBC 3.17 (L) 4.22 - 5.81 MIL/uL    Hemoglobin 9.2 (L) 13.0 - 17.0 g/dL    HCT 29.5 (L) 28.4 - 52.0 %    MCV 89.9 80.0 - 100.0 fL    MCH 29.0 26.0 - 34.0 pg    MCHC 32.3 30.0 - 36.0 g/dL    RDW 13.2 44.0 - 10.2 %    Platelets 270 150 - 400 K/uL    nRBC 0.0 0.0 - 0.2 %      Comment: Performed at [facility], [address], [place], [place] [address]  Glucose, capillary     Status: Abnormal    Collection Time: 06/26/23  8:42 AM  Result Value Ref Range    Glucose-Capillary 280 (H) 70 -  99 mg/dL      Comment: Glucose reference range applies only to samples taken after fasting for at least 8 hours.    Comment 1 Notify RN      Comment 2 Document in Chart    Glucose, capillary     Status: Abnormal    Collection Time: 06/26/23 11:56 AM  Result Value Ref Range    Glucose-Capillary 306 (H) 70 - 99 mg/dL      Comment: Glucose reference range applies only to samples taken after fasting for at least 8 hours.    Comment 1 Notify RN      Comment 2 Document in Chart    Glucose, capillary     Status: Abnormal    Collection Time: 06/26/23  4:32 PM  Result Value Ref Range    Glucose-Capillary 134 (H) 70 - 99 mg/dL       Comment: Glucose reference range applies only to samples taken after fasting for at least 8 hours.    Comment 1 Notify RN      Comment 2 Document in Chart    Glucose, capillary     Status: None    Collection Time: 06/26/23  8:51 PM  Result Value Ref Range    Glucose-Capillary 88 70 - 99 mg/dL      Comment: Glucose reference range applies only to samples taken after fasting for at least 8 hours.  Glucose, capillary     Status: None    Collection Time: 06/27/23 12:21 AM  Result Value Ref Range    Glucose-Capillary 93 70 - 99 mg/dL      Comment: Glucose reference range applies only to samples taken after fasting for at least 8 hours.  Glucose, capillary     Status: Abnormal    Collection Time: 06/27/23  4:28 AM  Result Value Ref Range    Glucose-Capillary 117 (H) 70 - 99 mg/dL      Comment: Glucose reference range applies only to samples taken after fasting for at least 8 hours.  Glucose, capillary     Status: Abnormal    Collection Time: 06/27/23  8:47 AM  Result Value Ref Range    Glucose-Capillary 122 (H) 70 - 99 mg/dL      Comment: Glucose reference range applies only to samples taken after fasting for at least 8 hours.      Imaging Results (Last 48 hours)  No results found.         Blood pressure (!) 164/89, pulse 94, temperature 98.7 F (37.1 C), temperature source Oral, resp. rate 19, height 6\' 1"  (1.854 m), weight 88.1 kg, SpO2 99 %.   Medical Problem List and Plan: 1. 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  Antithrombotics: -DVT/anticoagulation:  Pharmaceutical: Lovenox             -antiplatelet therapy: DAPT for at leasat  3 months. Has declined stenting.   3. Pain Management: Tylenol prn.  4. Mood/Behavior/Sleep: LCSW to follow for evaluation and support when appropriate.              -antipsychotic  agents: N/A 5. Neuropsych/cognition: This patient is not capable of making decisions on his own behalf. 6. Skin/Wound Care: Routine pressure relief  measures.  7. Fluids/Electrolytes/Nutrition: Strict I/O--start calorie count but will likely need cortak replaced.  --check CMET in am.  8. 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 HTN: Monitor BP TID--long term goal normotensive             --continue amlodipine 10 mg/day and coreg  6.25 mg BID.  10. T2DM w/peripheral neuropathy: Hgb A1c- 11.7. Was on Lantus 30 units PTA?  --Continue Insulin glargline-->was decreased to  5 units/HS today(to start tomorrow as received 10 units this am).   --Hold every 4 hours novolog as not on TF at this moment.  11. Acute on chronic renal failure?:  Improved with IVF for hydration-->was d/c yesterday but will resume with questionable intake --.SCr 1.02 in 09/22 per chart review. SCr 1.57 @ admission-->1.29-->1.31 12. Severe dysphagia: Continue D1, honey liquids. Willing to take liquids-->will offer thickened Glucerna with meals for now.             --check CXR to rule out aspiration event this am.  13. Hyperlipidemia: LDL 265. On Lipitor 80 mg daily. 14. Severe vitamin D deficiency: On Ergocalciferol weekly with dialy low supplement.  15. ABLA: Hgb 13.1 @ admission-->11.1-->9.2. Will order stool guaiacs.              --Senna added at bedtime as last BM 06/29.  16. Low grade fevers: Encourage pulmonary hygiene.  17. Hx tobacco/Marijuana use: Continue  nicotine patch.  Encourage cessation of Marijuana.  18. Intermittent episodes of N/V: Question due to gastroparesis (Multiple ED visits in the past for intractable N/V). 19. Nasal congestion- ordered Flonase 1 sprays daily.            Jacquelynn Cree, PA-C 06/27/2023   I have personally performed a face to face diagnostic evaluation of this patient and formulated the key components  of the plan.  Additionally, I have personally reviewed laboratory data, imaging studies, as well as relevant notes and concur with the physician assistant's documentation above.   The patient's status has not changed from the original H&P.  Any changes in documentation from the acute care chart have been noted above.

## 2023-06-27 NOTE — H&P (Signed)
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  Last known well 2 days prior to admission and reports of being on the floor 2 days ago with elevated BS then seeming tired and and lying around.  UDS positive for THC and he was out of window for TNK.  CT head showed moderate size subacute infarct in left basal ganglia with probable Petechial Hemorrhage and mild regional mass effect.  MRI/MRA brain w/o contrast showed acute to subacute perforater infarct at left basal ganglia with underlying severe stenosis v/s segmental occlusion of left M1 segment and Left ICA noted to be smaller than right and neck arterial imaging recommended to rule out proximal stenosis with underfilling.   Carotid dopplers done which was negative for significant ICA stenosis. Pulses bisferiens noted throughout carotid system with question of AV disease. TEE performed 06/21 revealing EF 55-60% with normal LVF and moderate concentric LVH, tricuspid AV and negative for thrombus or IA shunt.    He has had issues with decreased energy level, emesis as well as poor p.o. intake.  Dr. Melynda Ripple felt stroke was likely due to right M1 occlusion and recommended DAPT x 3 months.  CT abdomen/pelvis/chest done to rule out occult  malignancy. This revealed age advanced three vessel coronary artery calcifications as well as atherosclerotic calcifications involving aorta and iliac arteries, predominant RUL tree in bud pattern s/o chronic atypical infection such as MAC and sludge in GB. HIV--NR. TSH-0.766 Hypercoagulopathy panel negative.   He had worsening of swallow function,  left gaze preference and global aphasia with inability to follow commands and was made n.p.o. on 06/23. Repeat MRI brain 06/24 revealing interval increase in size of left MCA infarct including new/interval infarct left frontal lobe and new/interval small acute infarcts in right posterior limb internal capsule and similar petechial hemorrhage.  MBS performed 06/24 showing moderate to severe oral phase dysphagia with mild to moderate pharyngeal phase dysphagia with 1 episode of silent aspiration of nectar liquids and consistent penetration with thins as well as significant cognitive communicative deficits affecting safety and efficient p.o. consumption.  He was made n.p.o and transferred to [facility] on 06/25 for further workup and management. BP goal 140-180 recommended to prevent hypoperfusion due to severe M1 disease and before angiogram.  CTA head/neck done for workup of embolic stroke and showed evolving infarcts in left basal ganglia, left frontal and temporal lobes, and right posterior limb of internal capsule with mass effect on left lateral ventricle and 3 mm left-to-right midline shift.  He was also found to have severe near occlusive stenosis of left M1 with poor perfusion of proximal M2 branches, severe stenosis left V4 proximal to PICA takeoff severe stenosis left supraclinoid ICA, moderate stenosis right supraclinoid ICA and mild to moderate stenosis left V1 and V2 segment with heterogenous opacities dependent in right upper lobe which appeared to be increased compared to 06/15/2023 with question of aspiration versus infection.  Plans were for left MCA stenting today however due to significant neurological deficits, procedure was postponed pending reasonable improvement after CIR.   He has had issues with coughing  due to difficulty managing secretions and core track was placed for nutritional support.  MBS was repeated on 06/28 and patient started on D1 diet with honey liquids with aspiration  precautions as well as recommendations with self-feeding.  His mentation has been improving with increase in verbal output and ability to produce automatic sequences with cues as well as ability to follow some commands.  Blood sugars noted to be poorly controlled due to tube feeds.  He was started on ergocalciferol due to vitamin D deficiency.  KUB done today due to reports of constipation and was negative for significant stool burden. On am prior to admission, he was found by nurse to have copious amount of vomitus with cortak in his mouth therefore core track was DC'd. He has been refusing pureed foods but tolerating thickened liquids per ST notes.   He continues to be limited by dense right hemiplegia requiring right knee blocked at all times your transfers, has right foot lean on standing and is unable to turn head to the left, question of emerging spasticity or tone in RLE, left inattention as well as expressive/receptive aphasia. He was independent PTA and CIR recommended due to functional decline. Family to provide 24/7 assistance after discharge.    By nodding head yes and no- pt denies pain; denies constipation, but not sure of LBM.  Doesn't know that pulled out Cortrak by vomiting, however coughed/sneezed so hard, was projectile while I was in room.     Review of Systems  Unable to perform ROS: Medical condition  All other systems reviewed and are negative.    Past Medical History:  Diagnosis Date   Diabetes mellitus without complication [facility])     Past Surgical History:  Procedure Laterality Date   TEE WITHOUT CARDIOVERSION N/A 06/16/2023   Procedure: TRANSESOPHAGEAL ECHOCARDIOGRAM (TEE);  Surgeon: Jonelle Sidle, MD;  Location: [facility];  Service: Cardiovascular;  Laterality: N/A;    History reviewed. No pertinent family history.   Social History:  reports that he has been smoking cigarettes. He has a 5.00 pack-year smoking history. He has never used smokeless tobacco. He  reports current alcohol use. He reports current drug use. Frequency: 2.00 times per week. Drug: Marijuana.   Allergies  Allergen Reactions   Metformin Nausea And Vomiting   Penicillins Swelling   Medications Prior to Admission  Medication Sig Dispense Refill   insulin glargine (LANTUS) 100 UNIT/ML injection Inject 30 Units into the skin daily.     atorvastatin (LIPITOR) 10 MG tablet Take 1 tablet (10 mg total) by mouth daily. (Patient not taking: Reported on 04/20/2021) 30 tablet 1   blood glucose meter kit and supplies Dispense based on patient and insurance preference. Check blood sugar four times daily as directed. (FOR ICD-10 E10.9, E11.9). 1 each 0   Insulin Syringe-Needle U-100 (GNP INSULIN SYRINGES 31GX5/16") 31G X 5/16" 0.3 ML MISC Use as instructed to inject insulin twice a day. 300 each 3   potassium chloride SA (KLOR-CON) 20 MEQ tablet Take 1 tablet (20 mEq total) by mouth 2 (two) times daily for 7 days. (Patient not taking: Reported on 06/14/2023) 14 tablet 0      Home: Home Living Family/patient expects to be discharged to:: Private residence Living Arrangements:  (lives with sister, her spouse and 44 yo child) Available Help at Discharge:  (sister's StepDad, can provide care when she is working) Type of Home: House Home Access: Level entry Home Layout: One level Bathroom Shower/Tub: Health visitor: Standard  Bathroom Accessibility: Yes Home Equipment: None  Lives With: Family   Functional History: Prior Function Prior Level of Function : Independent/Modified Independent Mobility Comments: Tourist information centre manager without AD. ADLs Comments: Independent  Functional Status:  Mobility: Bed Mobility Overal bed mobility: Needs Assistance Bed Mobility: Supine to Sit, Sit to Supine, Rolling Rolling: Min assist Supine to sit: Mod assist, HOB elevated Sit to supine: HOB elevated, Mod assist General bed mobility comments: Min assist for rolling in bed, cues  for LUE use on rail. Mod assist for trunk and RLE support in/out of bed with cues for awareness and technique. Transfers Overall transfer level: Needs assistance Equipment used: 2 person hand held assist Transfers: Sit to/from Stand Sit to Stand: Mod assist, +2 physical assistance, +2 safety/equipment Bed to/from chair/wheelchair/BSC transfer type:: Lateral/scoot transfer Step pivot transfers: Mod assist, Max assist  Lateral/Scoot Transfers: Max assist, +2 safety/equipment, +2 physical assistance General transfer comment: Mod assist +2 with hand held support for boost and balance to rise from slightly elevated bed surface x2. (Deferred transfer as pt has been in chair all morning and recently returned to bed before PT arrival.) Pt required Rt knee block at all times. Has Rightward lean. RUE supported. Facilitated leftward shift to find midline with transitions. Unable to turn head to left to target where to reach with LUE. Ambulation/Gait Ambulation/Gait assistance: Max assist Gait Distance (Feet): 2 Feet Assistive device: 1 person hand held assist Gait Pattern/deviations: Decreased step length - right, Decreased stance time - right, Decreased step length - left, Knees buckling, Decreased stride length, Ataxic, Shuffle General Gait Details: unable at this time Gait velocity: slow Pre-gait activities: Stood at EOB and worked on [employer] shifting. Pt able to RLE from ground. Unsure if automatic from spasticity/tone or if he volitionally did this - pt shook his head yes when asked if he was attempting to lift LLE from floor.    ADL: ADL Overall ADL's : Needs assistance/impaired Eating/Feeding: Moderate assistance, Minimal assistance, Sitting Grooming: Moderate assistance, Sitting Upper Body Bathing: Moderate assistance, Sitting Lower Body Bathing: Maximal assistance, Sitting/lateral leans, Bed level Upper Body Dressing : Moderate assistance, Sitting Lower Body Dressing: Maximal assistance,  Sitting/lateral leans Toilet Transfer: Moderate assistance, Maximal assistance, Stand-pivot, Rolling walker (2 wheels) Toilet Transfer Details (indicate cue type and reason): Simulated via EOB to chair transfer and sit to stand from chair. Toileting- Clothing Manipulation and Hygiene: Total assistance, Maximal assistance, Bed level Tub/ Shower Transfer: Moderate assistance, Maximal assistance, Rolling walker (2 wheels) Functional mobility during ADLs: Maximal assistance, Moderate assistance, Rolling walker (2 wheels) General ADL Comments: Pt able to ambulate one step forward from chair with RW.  Cognition: Cognition Overall Cognitive Status: Difficult to assess Arousal/Alertness: Awake/alert Orientation Level: Other (comment) (nods/shakes head in response to questions. Able to say the word "no" clearly) Cognition Arousal/Alertness: Awake/alert Behavior During Therapy: Flat affect Overall Cognitive Status: Difficult to assess Area of Impairment: Safety/judgement, Awareness, Problem solving, Attention, Memory, Following commands Current Attention Level: Sustained Memory: Decreased short-term memory Following Commands: Follows one step commands with increased time, Follows multi-step commands inconsistently, Follows one step commands inconsistently Safety/Judgement: Decreased awareness of safety, Decreased awareness of deficits Awareness: Intellectual Problem Solving: Slow processing, Decreased initiation, Difficulty sequencing, Requires verbal cues, Requires tactile cues General Comments: Pt nodding head yes/no to respond to questions, fairly consistent, seems to correct himself if given time and asked if yes/no was what he meant. Difficult to assess due to: Impaired communication  Physical Exam: Blood pressure (!) 164/89, pulse 94, temperature  98.7 F (37.1 C), temperature source Oral, resp. rate 19, height 6\' 1"  (1.854 m), weight 88.1 kg, SpO2 99 %. Physical Exam Vitals and nursing  note reviewed.  Constitutional:      Appearance: Normal appearance.     Comments: Pt awake, somewhat alert; sitting up in bed; sneezed/coughed so hard, was projectile across room; Cortrak out; NAD- appears sad  HENT:     Head: Normocephalic and atraumatic.     Comments: R facial droop- severe R tongue deviation Sensation on face- cannot tell, but pt says intact?    Right Ear: External ear normal.     Left Ear: External ear normal.     Nose: Congestion and rhinorrhea present.     Mouth/Throat:     Mouth: Mucous membranes are moist.     Pharynx: Oropharyngeal exudate present.  Eyes:     General:        Right eye: No discharge.        Left eye: No discharge.  Cardiovascular:     Rate and Rhythm: Normal rate and regular rhythm.     Heart sounds: Normal heart sounds. No murmur heard.    No gallop.  Pulmonary:     Comments: Upper airway sounds, some sneezing; 1 time large cough- projectile sputum- clear Abdominal:     General: Bowel sounds are normal. There is no distension.     Palpations: Abdomen is soft.     Tenderness: There is no abdominal tenderness.  Genitourinary:    Comments: Condom catheter in place- looks OK Musculoskeletal:     Cervical back: Neck supple. No tenderness.     Comments: LUE 5/5 in Biceps, triceps, WE, and grip RUE biceps 2-/5- otherwise 0/5 LLE- 5/5 in HF/KE/DF and PF RLE- 0/5 in same muscles  Skin:    General: Skin is warm and dry.     Comments: IV L AC fossa- looks OK  Neurological:     Mental Status: He is alert.     Comments: Nods yes and no inconsistently to basic biographic questions. He was able to make some sounds and was able to state "left" and choose name from choice of two. Oral secretions note with attempts at vocalization as well as occasional cough. Lunch tray untouched at bedside.  Right facial weakness with global aphasia. He was unable to follow simple commands without visual/tactile cues. Unable to point or imitate use of call bell.  Noted to have dense right hemiplegia with flexor tone RLE>RUE. Did move eyes to right field to command.   Followed muscle exam for me- with delayed responses Pt insistent intact to light touch in all 4 exremities, but appears to have R sided neglect as well   Psychiatric:     Comments: Sad, flat affect- tearful while I was in room, when asked how he was doing     Results for orders placed or performed during the hospital encounter of 06/14/23 (from the past 48 hour(s))  Glucose, capillary     Status: Abnormal   Collection Time: 06/25/23 12:50 PM  Result Value Ref Range   Glucose-Capillary 291 (H) 70 - 99 mg/dL    Comment: Glucose reference range applies only to samples taken after fasting for at least 8 hours.   Comment 1 Notify RN   Glucose, capillary     Status: Abnormal   Collection Time: 06/25/23  5:07 PM  Result Value Ref Range   Glucose-Capillary 168 (H) 70 - 99 mg/dL    Comment: Glucose  reference range applies only to samples taken after fasting for at least 8 hours.   Comment 1 Notify RN   Glucose, capillary     Status: Abnormal   Collection Time: 06/25/23  9:21 PM  Result Value Ref Range   Glucose-Capillary 179 (H) 70 - 99 mg/dL    Comment: Glucose reference range applies only to samples taken after fasting for at least 8 hours.  Glucose, capillary     Status: Abnormal   Collection Time: 06/25/23 11:52 PM  Result Value Ref Range   Glucose-Capillary 201 (H) 70 - 99 mg/dL    Comment: Glucose reference range applies only to samples taken after fasting for at least 8 hours.  Glucose, capillary     Status: Abnormal   Collection Time: 06/26/23  3:31 AM  Result Value Ref Range   Glucose-Capillary 230 (H) 70 - 99 mg/dL    Comment: Glucose reference range applies only to samples taken after fasting for at least 8 hours.  Basic metabolic panel     Status: Abnormal   Collection Time: 06/26/23  7:08 AM  Result Value Ref Range   Sodium 141 135 - 145 mmol/L   Potassium 3.8 3.5 -  5.1 mmol/L   Chloride 104 98 - 111 mmol/L   CO2 28 22 - 32 mmol/L   Glucose, Bld 275 (H) 70 - 99 mg/dL    Comment: Glucose reference range applies only to samples taken after fasting for at least 8 hours.   BUN 15 6 - 20 mg/dL   Creatinine, Ser 8.29 (H) 0.61 - 1.24 mg/dL   Calcium 8.4 (L) 8.9 - 10.3 mg/dL   GFR, Estimated >56 >21 mL/min    Comment: (NOTE) Calculated using the CKD-EPI Creatinine Equation (2021)    Anion gap 9 5 - 15    Comment: Performed at [facility], [address] [place], [place] [address]  CBC     Status: Abnormal   Collection Time: 06/26/23  7:08 AM  Result Value Ref Range   WBC 10.5 4.0 - 10.5 K/uL   RBC 3.17 (L) 4.22 - 5.81 MIL/uL   Hemoglobin 9.2 (L) 13.0 - 17.0 g/dL   HCT 78.4 (L) 69.6 - 29.5 %   MCV 89.9 80.0 - 100.0 fL   MCH 29.0 26.0 - 34.0 pg   MCHC 32.3 30.0 - 36.0 g/dL   RDW 28.4 13.2 - 44.0 %   Platelets 270 150 - 400 K/uL   nRBC 0.0 0.0 - 0.2 %    Comment: Performed at [facility], [address] [place], [place] [address]  Glucose, capillary     Status: Abnormal   Collection Time: 06/26/23  8:42 AM  Result Value Ref Range   Glucose-Capillary 280 (H) 70 - 99 mg/dL    Comment: Glucose reference range applies only to samples taken after fasting for at least 8 hours.   Comment 1 Notify RN    Comment 2 Document in Chart   Glucose, capillary     Status: Abnormal   Collection Time: 06/26/23 11:56 AM  Result Value Ref Range   Glucose-Capillary 306 (H) 70 - 99 mg/dL    Comment: Glucose reference range applies only to samples taken after fasting for at least 8 hours.   Comment 1 Notify RN    Comment 2 Document in Chart   Glucose, capillary     Status: Abnormal   Collection Time: 06/26/23  4:32 PM  Result Value Ref Range  Glucose-Capillary 134 (H) 70 - 99 mg/dL    Comment: Glucose reference range applies only to samples taken after fasting for at least 8 hours.   Comment 1 Notify RN    Comment 2 Document in Chart   Glucose,  capillary     Status: None   Collection Time: 06/26/23  8:51 PM  Result Value Ref Range   Glucose-Capillary 88 70 - 99 mg/dL    Comment: Glucose reference range applies only to samples taken after fasting for at least 8 hours.  Glucose, capillary     Status: None   Collection Time: 06/27/23 12:21 AM  Result Value Ref Range   Glucose-Capillary 93 70 - 99 mg/dL    Comment: Glucose reference range applies only to samples taken after fasting for at least 8 hours.  Glucose, capillary     Status: Abnormal   Collection Time: 06/27/23  4:28 AM  Result Value Ref Range   Glucose-Capillary 117 (H) 70 - 99 mg/dL    Comment: Glucose reference range applies only to samples taken after fasting for at least 8 hours.  Glucose, capillary     Status: Abnormal   Collection Time: 06/27/23  8:47 AM  Result Value Ref Range   Glucose-Capillary 122 (H) 70 - 99 mg/dL    Comment: Glucose reference range applies only to samples taken after fasting for at least 8 hours.   No results found.    Blood pressure (!) 164/89, pulse 94, temperature 98.7 F (37.1 C), temperature source Oral, resp. rate 19, height 6\' 1"  (1.854 m), weight 88.1 kg, SpO2 99 %.  Medical Problem List and Plan: 1. 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  Antithrombotics: -DVT/anticoagulation:  Pharmaceutical: Lovenox  -antiplatelet therapy: DAPT for at leasat  3 months. Has declined stenting.   3. Pain Management: Tylenol prn.  4. Mood/Behavior/Sleep: LCSW to follow for evaluation and support when appropriate.   -antipsychotic agents: N/A 5. Neuropsych/cognition: This patient is not capable of making decisions on his own behalf. 6. Skin/Wound Care: Routine pressure relief measures.  7. Fluids/Electrolytes/Nutrition: Strict I/O--start calorie count but will likely need cortak replaced.   --check CMET in am.  8. 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 HTN: Monitor BP TID--long term goal normotensive  --continue amlodipine 10 mg/day and coreg  6.25 mg BID.  10. T2DM w/peripheral neuropathy: Hgb A1c- 11.7. Was on Lantus 30 units PTA?  --Continue Insulin glargline-->was decreased to  5 units/HS today(to start tomorrow as received 10 units this am).   --Hold every 4 hours novolog as not on TF at this moment.  11. Acute on chronic renal failure?:  Improved with IVF for hydration-->was d/c yesterday but will resume with questionable intake --.SCr 1.02 in 09/22 per chart review. SCr 1.57 @ admission-->1.29-->1.31 12. Severe dysphagia: Continue D1, honey liquids. Willing to take liquids-->will offer thickened Glucerna with meals for now.  --check CXR to rule out aspiration event this am.  13. Hyperlipidemia: LDL 265. On Lipitor 80 mg daily. 14. Severe vitamin D deficiency: On Ergocalciferol weekly with dialy low supplement.  15. ABLA: Hgb 13.1 @ admission-->11.1-->9.2. Will order stool guaiacs.   --Senna added at bedtime as last BM 06/29.  16. Low grade fevers: Encourage pulmonary hygiene.  17. Hx tobacco/Marijuana use: Continue  nicotine patch.  Encourage cessation of Marijuana.  18. Intermittent episodes of N/V: Question due to gastroparesis (Multiple ED visits in the past for intractable N/V). 19. Nasal congestion- ordered Flonase 1 sprays daily.       Jacquelynn Cree, PA-C 06/27/2023  I have personally performed a face to face diagnostic evaluation of this patient and formulated the key components of the plan.  Additionally, I have personally reviewed laboratory data, imaging studies, as well as relevant notes and concur with the physician assistant's documentation above.   The patient's status has not changed from the original H&P.  Any changes in documentation from the acute care chart have  been noted above.

## 2023-06-27 NOTE — Progress Notes (Signed)
Inpatient Rehabilitation Center Individual Statement of Services  Patient Name:  Zachary Winters  Date:  06/27/2023  Welcome to the Inpatient Rehabilitation Center.  Our goal is to provide you with an individualized program based on your diagnosis and situation, designed to meet your specific needs.  With this comprehensive rehabilitation program, you will be expected to participate in at least 3 hours of rehabilitation therapies Monday-Friday, with modified therapy programming on the weekends.  Your rehabilitation program will include the following services:  Physical Therapy (PT), Occupational Therapy (OT), Speech Therapy (ST), 24 hour per day rehabilitation nursing, Therapeutic Recreaction (TR), Neuropsychology, Care Coordinator, Rehabilitation Medicine, Nutrition Services, Pharmacy Services, and Other  Weekly team conferences will be held on Wednesdays to discuss your progress.  Your Inpatient Rehabilitation Care Coordinator will talk with you frequently to get your input and to update you on team discussions.  Team conferences with you and your family in attendance may also be held.  Expected length of stay: 10-14 Days  Overall anticipated outcome:  Supervision- Min A  Depending on your progress and recovery, your program may change. Your Inpatient Rehabilitation Care Coordinator will coordinate services and will keep you informed of any changes. Your Inpatient Rehabilitation Care Coordinator's name and contact numbers are listed  below.  The following services may also be recommended but are not provided by the Inpatient Rehabilitation Center:   Home Health Rehabiltiation Services Outpatient Rehabilitation Services    Arrangements will be made to provide these services after discharge if needed.  Arrangements include referral to agencies that provide these services.  Your insurance has been verified to be:   [place] MEDICAID Your primary doctor is:  NO PCP  Pertinent information will be  shared with your doctor and your insurance company.  Inpatient Rehabilitation Care Coordinator:  Lavera Guise, [place] [phone] or [phone]  Information discussed with and copy given to patient by: Andria Rhein, 06/27/2023, 1:30 PM

## 2023-06-27 NOTE — TOC Transition Note (Signed)
Transition of Care [facility]) - CM/SW Discharge Note   Patient Details  Name:  Zachary Winters MRN: [id number] Date of Birth: [birth year]  Transition of Care [facility]) CM/SW Contact:  Kermit Balo, RN Phone Number: 06/27/2023, 1:53 PM   Clinical Narrative:    Pt is discharging to CIR today. No further needs per TOC.   Final next level of care: IP Rehab Facility Barriers to Discharge: No Barriers Identified   Patient Goals and CMS Choice CMS Medicare.gov Compare Post Acute Care list provided to:: Patient Represenative (must comment) Choice offered to / list presented to : Parent  Discharge Placement                         Discharge Plan and Services Additional resources added to the After Visit Summary for                                       Social Determinants of Health (SDOH) Interventions SDOH Screenings   Food Insecurity: No Food Insecurity (10/31/2019)  Transportation Needs: No Transportation Needs (10/31/2019)  Depression (PHQ2-9): Medium Risk (01/21/2021)  Financial Resource Strain: Low Risk  (10/31/2019)  Physical Activity: Sufficiently Active (10/31/2019)  Social Connections: Socially Isolated (10/31/2019)  Stress: No Stress Concern Present (10/31/2019)  Tobacco Use: High Risk (06/20/2023)     Readmission Risk Interventions     No data to display

## 2023-06-27 NOTE — Significant Event (Signed)
Report has been given to receiving staff in Rehab. Room is currently not ready at this time and staff will call RN when it is ready. Patient and family updated regarding today's plan.

## 2023-06-27 NOTE — Progress Notes (Addendum)
Inpatient Rehab Admissions Coordinator:    I have a CIR bed for this Pt. Today. RN may call report to [phone].  Pt.'s family in agreement to admit to CIR today, with the goal of participating in an intensive rehab program for 10-14  days, reaching min A goals, and returning home with 24/7 support of sister and stepfather.   Megan Salon, MS, CCC-SLP Rehab Admissions Coordinator  [phone] (celll) [phone] (office)

## 2023-06-27 NOTE — Progress Notes (Signed)
Inpatient Rehabilitation Admission Medication Review by a Pharmacist  A complete drug regimen review was completed for this patient to identify any potential clinically significant medication issues.  High Risk Drug Classes Is patient taking? Indication by Medication  Antipsychotic No   Anticoagulant Yes Lovenox - VTE ppx  Antibiotic No   Opioid No   Antiplatelet Yes bASA/Plavix - stroke ppx for at least 3 months  Hypoglycemics/insulin Yes Semglee - T2DM  Vasoactive Medication Yes Coreg, amlodipine - HTN  Chemotherapy No   Other Yes Benadryl - itching Robitussin DM - cough Trazodone - sleep Atorvastatin - HLD Nicotine patch - smoking cessation Vit D - supplementation     Type of Medication Issue Identified Description of Issue Recommendation(s)  Drug Interaction(s) (clinically significant)     Duplicate Therapy     Allergy     No Medication Administration End Date     Incorrect Dose     Additional Drug Therapy Needed     Significant med changes from prior encounter (inform family/care partners about these prior to discharge).    Other       Clinically significant medication issues were identified that warrant physician communication and completion of prescribed/recommended actions by midnight of the next day:  No  Name of provider notified for urgent issues identified:   Provider Method of Notification:    Pharmacist comments:   Time spent performing this drug regimen review (minutes):  15  Rexford Maus, PharmD, BCPS

## 2023-06-27 NOTE — Discharge Summary (Signed)
Physician Discharge Summary  Zachary PEFLEY WFU:[id number] DOB: 12-Aug-1981 DOA: 06/14/2023  PCP: System, Provider Not In  Admit date: 06/14/2023 Discharge date: 06/27/2023 Recommendations for Outpatient Follow-up:  Follow up with PCP in 1 weeks-call for appointment Please obtain BMP/CBC in one week  Discharge Dispo:CIR Discharge Condition: Stable Code Status:   Code Status: Full Code Diet recommendation:  Diet Order             DIET - DYS 1 Room service appropriate? No; Fluid consistency: Honey Thick  Diet effective now                    Brief/Interim Summary: 42 year old gentleman with poorly controlled diabetes mellitus on insulin, chronic daily smoker, hypertension, history of marijuana use, diabetic peripheral neuropathy, hyperlipidemia was sent to the emergency department by [facility] with right-sided weakness right-sided facial droop and difficulty speaking.  He was last known to be well 2 days ago at 6 AM when his sister noticed he went out to smoke on ring camera.  He apparently lives with his sister.  Sister noticed the patient lying on the floor having a difficult time communicating only able to nod yes and no.  She noticed that for the last 2 days he has been lying around and sleeping. In ED: had significant aphasia and right-sided hemiparesis.  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  His blood glucose was 109.  Code Stroke was not activated due to being 40 hours since last known well time.  06/14/23:Admitted at [facility]> MRI brain:acute to subacute perforator infarct at the left basal ganglia and adjacent white matter with petechial hemorrhage. 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 Subsequently underwent TTE, TEE that was negative for hyper coagulable labs ordered seen by neuro PT OT speech, CT chest abdomen pelvis unremarkable and occult  malignancy. 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 New/interval small acute infarct in the right posterior limb of the internal capsule> neurology was notified advised to transfer to [facility] for further neurology management. 06/20/23 night patient arrived at [facility] 06/21/23;Cortrak ordered>  06/23/23:placed on DYS 1 diet after MBS. 06/26/23 > patient family after discussion with neurology holding off angiogram/stenting . 7/1 night core track got dislodged after episode of vomiting following cough CIR has been approved and is being transferred to [facility] 7/2     Discharge Diagnoses:  Principal Problem:   Acute stroke due to ischemia [facility]) Active Problems:   Hypertension associated with diabetes (HCC)   Type 2 diabetes mellitus with diabetic neuropathy (HCC)   Hyperlipidemia associated with type 2 diabetes mellitus (HCC)   Dysphonia   Aphasia   Tobacco use disorder   Recreational drug use   Diabetic neuropathy (HCC)   Vitamin D deficiency   Apraxia  Left MCA territory stroke due to L M1 occlusion with is a likely large vessel disease Severe M1 left stenosis: First started in the basal ganglia then extending as well as a small infarct in the posterior limb of right internal capsule. stroke work up completed w/ CT head MRI x2,MRA carotid Dopplers, TTE/TEE-see report for results. HbA1c uncontrolled 1.7 LDL uncontrolled 265. Started on Lipitor 80 mg,aspirin 81 Plavix 75 > cont  at least for 3 months 2/2 M1 stenosis. Transferred to [facility] for neuro eval due to worsening of rt sided weakness and aphasia Discussed Dr. Pearlean Brownie after discussion not pursuing stenting and plan for elective angiogram with stenting if patient shows clinical improvement Awaiting for CIR Continue dysphagia 1  diet, reinsert core track tube feeding if not meeting dietary goals. Dietitian and speech PT OT following We discussed about risk of aspiration- cont suctioning, aspiration precautions- he is able to do himself  Dysphagia: Tube got dislodged 7/1 due to vomiting.  Continue dysphagia 1 diet, reinsert core track tube feeding if not meeting dietary goals.core track team not here today  Hypertension: BP now fairly controlled. Started on new med w/ Amlodipine 10 mg Coreg 6.25-goal is to keep in 149-160s for now.  Hyperlipidemia: LDL 265 started on Atorvastatin 80 mg  T2DM  w/ uncontrolled hyperglycemia A1c 9.7, with diabetic peripheral neuropathy: Appears to be noncompliant, blood sugar now poorly controlled after initiating tube feeding.  Being maanged w/ semeglee 10 bid and  novolog 4 u q4hr- but will change to ssi and semeglee 5 at bedtime as TF not running.  AKI vs CKD 3a Metabolic acidosis: Creatinine previously 1.0 in 2022 on admission 1.5, remains overall better in  ~1.3-1.5. monitor  Constipation checked x-ray abdomen-unremarkable, cont laxative, add  Dulcolax PR PRN.   Anemia hemoglobin in 9 gm, monitor  Hypokalemia resolved   Severe vitamin D deficiency: Resume weekly high-dose supplement   Tobacco abuse: will need to stop smoking.  Continue scheduled nicotine patch  Recreational drug use UDS positive for marijuana: TOC consult for resources  Deconditioning debility Continue aggressive PT OT speech eval. use restraint on the left side to protect core track tube.  Right upper lobe predominant tree-in-bud pattern suggesting chronic inflammation atypical infection such as MAC: Will refer him out to pulmonary clinic at discharge for further evaluation of this.  At risk of aspiration continue suctioning  CT noted advanced coronary calcifications:Will refer him out to cardiology at discharge for surveillance.     Consults: Neurology, rehab Subjective: Alert awake follows  commands nods for response  Discharge Exam: Vitals:   06/27/23 0843 06/27/23 1305  BP: (!) 164/89 (!) 165/96  Pulse: 94 (!) 106  Resp: 19 17  Temp: 98.7 F (37.1 C) 97.8 F (36.6 C)  SpO2: 99% 99%   General: Pt is alert, awake, not in acute distress Cardiovascular: RRR, S1/S2 +, no rubs, no gallops Respiratory: CTA bilaterally, no wheezing, no rhonchi Abdominal: Soft, NT, ND, bowel sounds + Extremities: no edema, no cyanosis  Discharge Instructions  Discharge Instructions     Ambulatory referral to Cardiology   Complete by: As directed    Ambulatory referral to Neurology   Complete by: As directed    An appointment is requested in approximately: 3-4 weeks   Ambulatory referral to Pulmonology   Complete by: As directed    Chest CT with findings in RUL of chronic inflammation suggestive of atypical infection such as MAC   Reason for referral: Other      Allergies as of 06/27/2023       Reactions   Metformin Nausea And Vomiting   Penicillins Swelling        Medication List     STOP taking these medications    insulin glargine 100 UNIT/ML injection Commonly known as: LANTUS   potassium chloride SA 20 MEQ tablet Commonly known as: KLOR-CON M       TAKE these medications  amLODipine 10 MG tablet Commonly known as: NORVASC Take 1 tablet (10 mg total) by mouth daily. Start taking on: June 28, 2023   aspirin 81 MG chewable tablet Place 1 tablet (81 mg total) into feeding tube daily. Start taking on: June 28, 2023   atorvastatin 80 MG tablet Commonly known as: LIPITOR Take 1 tablet (80 mg total) by mouth daily. What changed:  medication strength how much to take   bisacodyl 10 MG suppository Commonly known as: DULCOLAX Place 1 suppository (10 mg total) rectally daily as needed for moderate constipation.   blood glucose meter kit and supplies Dispense based on patient and insurance preference. Check blood sugar four times daily as directed. (FOR  ICD-10 E10.9, E11.9).   carvedilol 6.25 MG tablet Commonly known as: COREG Take 1 tablet (6.25 mg total) by mouth 2 (two) times daily with a meal.   clopidogrel 75 MG tablet Commonly known as: PLAVIX 1 tablet (75 mg total) by Per NG tube route daily. Start taking on: June 28, 2023   insulin glargine-yfgn 100 UNIT/ML injection Commonly known as: SEMGLEE Inject 0.05 mLs (5 Units total) into the skin at bedtime. Start taking on: June 28, 2023   Insulin Syringe-Needle U-100 31G X 5/16" 0.3 ML Misc Commonly known as: GNP Insulin Syringes 31Gx5/16" Use as instructed to inject insulin twice a day.   nicotine 21 mg/24hr patch Commonly known as: NICODERM CQ - dosed in mg/24 hours Place 1 patch (21 mg total) onto the skin daily. Start taking on: June 28, 2023   senna-docusate 8.6-50 MG tablet Commonly known as: Senokot-S Take 1 tablet by mouth at bedtime as needed for mild constipation.   Vitamin D (Ergocalciferol) 1.25 MG (50000 UNIT) Caps capsule Commonly known as: DRISDOL Take 1 capsule (50,000 Units total) by mouth every 7 (seven) days. Start taking on: June 28, 2023        Follow-up Information     [facility]. Schedule an appointment as soon as possible for a visit in 1 month(s).   Why: Hospital Follow Up, to evaluate for atypical lung infection        [facility]. Schedule an appointment as soon as possible for a visit in 6 week(s).   Specialty: Cardiology Why: Hospital Follow Up Contact information: [address] [id number] mc [place] [address] [phone]        Micki Riley, MD. Schedule an appointment as soon as possible for a visit in 8 week(s).   Specialties: Neurology, Radiology Why: Hospital Follow Up Contact information: [address] [place] [address] [phone]                Allergies  Allergen Reactions   Metformin Nausea And Vomiting   Penicillins  Swelling    The results of significant diagnostics from this hospitalization (including imaging, microbiology, ancillary and laboratory) are listed below for reference.    Microbiology: No results found for this or any previous visit (from the past 240 hour(s)).  Procedures/Studies: DG Abd Portable 1V  Result Date: 06/27/2023 CLINICAL DATA:  Constipation EXAM: PORTABLE ABDOMEN - 1 VIEW COMPARISON:  06/21/2023 FINDINGS: Contrast within the colon and rectum is identified. No dilated bowel loops are noted. No significant stool burden identified. No other significant abnormalities noted. IMPRESSION: No evidence of bowel obstruction or significant stool burden. Electronically Signed   By: Harmon Pier M.D.   On: 06/27/2023 13:17   DG Swallowing Func-Speech Pathology  Result Date: 06/23/2023 Table formatting from the original result was not included. Modified Barium Swallow Study Patient Details Name: Zachary Winters MRN: [id number] Date of Birth: 03-11-1981 Today's Date: 06/23/2023 HPI/PMH: HPI: Pt is a 42 year old male who presented with right-sided weakness, right-sided facial droop and difficulty speaking. 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 BSE 6/19 with minimal R anterior spillage, decreased coordination and R anterior spillage; dysphagia 2 diet with thin liquids recommended. 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 Similar petechial hemorrhage without mass occupying acute hemorrhage. New/interval small acute infarct in the right posterior limb of the internal capsule. MBS 6/24: mod/severe oral phase dysphagia and mild/mod pharyngeal phase dysphagia characterized by weak lingual movement and bolus manipulation with labial spillage, oral  holding, and spillover to the pharynx. An NPO status recommended with snacks of puree and honey thick liquids. PMH: diabetes mellitus on insulin, chronic daily smoker, hypertension, history of marijuana use, diabetic peripheral neuropathy, hyperlipidemia Clinical Impression: Clinical Impression: Pt presents with oropharyngeal dysphagia characterized by characterized by weak bolus manipulation, a pharyngeal delay and reduction in labial seal, bolus cohesion, tongue base retraction, and anterior laryngeal movement. Pt demonstrated right-sided anterior spillage across liquid consistenties, impaired A-P transport, inadequate lingual movement for mastication (bolus ultimately removed), and premature spillage to the pyriform sinuses. Pt intermittently used a posterior head tilt to facilitate A-P transport, but moderate oral residue remained in the lateral sulci and floor of the mouth. Penetration (PAS 3) and aspiration (PAS 7) were inconsistently noted nectar thick liquids via cup secondary to premature spillage combined with the pharyngeal delay. Pt's violent cough projected oral residue across the fluoro suite and propelled some aspirated material superiorly, but was ineffective in expelling all of the aspirated material. No instances of laryngeal invasion were observed with thin liquids, but this is likely since significant anterior spillage and oral residue resulted in pt only swallowing very small boluses of thin liquids. Pt's overall swallow function is improved with aspiration now triggering a violent cough, pt more consistently swallowing, and having improved bolus manipulation but pt's dysphagia remains significant. A dysphagia 1 diet with honey thick liquids is recommended at this time and SLP will continue to follow for treatment. Factors that may increase risk of adverse event in presence of aspiration [name] & [name] 2021): Factors that may increase risk of adverse event in presence of aspiration [name] &  [name] 2021): Limited mobility; Dependence for feeding and/or oral hygiene; Inadequate oral hygiene Recommendations/Plan: Swallowing Evaluation Recommendations Swallowing Evaluation Recommendations Recommendations: PO diet PO Diet Recommendation: Dysphagia 1 (Pureed); Moderately thick liquids (Level 3, honey thick) Liquid Administration via: Spoon; Cup Medication Administration: Crushed with puree (or via cortrak) Supervision: Full supervision/cueing for swallowing strategies; Staff to assist with self-feeding Swallowing strategies  : Slow rate; Small bites/sips; Check for pocketing or oral holding; Check for anterior loss; Multiple dry swallows after each bite/sip Postural changes: Position pt fully upright for meals; Stay upright 30-60 min after meals Oral care recommendations: Staff/trained caregiver to provide oral care; Oral care before ice chips/water Caregiver Recommendations: Have oral suction available Treatment Plan Treatment Plan Treatment recommendations: Therapy as outlined in treatment plan below Follow-up recommendations: Acute inpatient rehab (3 hours/day) Functional status assessment: Patient has had a recent decline in their functional status  and demonstrates the ability to make significant improvements in function in a reasonable and predictable amount of time. Treatment frequency: Min 2x/week Treatment duration: 2 weeks Interventions: Aspiration precaution training; Oropharyngeal exercises; Patient/family education; Trials of upgraded texture/liquids Recommendations Recommendations for follow up therapy are one component of a multi-disciplinary discharge planning process, led by the attending physician.  Recommendations may be updated based on patient status, additional functional criteria and insurance authorization. Assessment: Orofacial Exam: Orofacial Exam Oral Cavity - Dentition: Adequate natural dentition Anatomy: Anatomy: Suspected cervical osteophytes Boluses Administered: Boluses  Administered Boluses Administered: Thin liquids (Level 0); Mildly thick liquids (Level 2, nectar thick); Moderately thick liquids (Level 3, honey thick); Puree; Solid  Oral Impairment Domain: Oral Impairment Domain Lip Closure: Escape beyond mid-chin Tongue control during bolus hold: Posterior escape of greater than half of bolus Bolus preparation/mastication: Minimal chewing/mashing with majority of bolus unchewed Bolus transport/lingual motion: Repetitive/disorganized tongue motion Oral residue: Residue collection on oral structures Location of oral residue : Floor of mouth; Tongue Initiation of pharyngeal swallow : Pyriform sinuses  Pharyngeal Impairment Domain: Pharyngeal Impairment Domain Soft palate elevation: No bolus between soft palate (SP)/pharyngeal wall (PW) Laryngeal elevation: Complete superior movement of thyroid cartilage with complete approximation of arytenoids to epiglottic petiole Anterior hyoid excursion: Partial anterior movement Epiglottic movement: Complete inversion Laryngeal vestibule closure: Incomplete, narrow column air/contrast in laryngeal vestibule Pharyngeal stripping wave : Present - diminished Pharyngeal contraction (A/P view only): N/A Pharyngoesophageal segment opening: Complete distension and complete duration, no obstruction of flow Tongue base retraction: Narrow column of contrast or air between tongue base and PPW Pharyngeal residue: Collection of residue within or on pharyngeal structures Location of pharyngeal residue: Valleculae; Pyriform sinuses  Esophageal Impairment Domain: Esophageal Impairment Domain Esophageal clearance upright position: Complete clearance, esophageal coating Pill: Pill Consistency administered: -- (NT) Penetration/Aspiration Scale Score: Penetration/Aspiration Scale Score 1.  Material does not enter airway: Puree; Moderately thick liquids (Level 3, honey thick) 3.  Material enters airway, remains ABOVE vocal cords and not ejected out: Mildly thick  liquids (Level 2, nectar thick) 7.  Material enters airway, passes BELOW cords and not ejected out despite cough attempt by patient: Mildly thick liquids (Level 2, nectar thick) Compensatory Strategies: Compensatory Strategies Compensatory strategies: No   General Information: Caregiver present: No  Diet Prior to this Study: NPO   Temperature : Normal   Respiratory Status: WFL   Supplemental O2: None (Room air)   History of Recent Intubation: No  Behavior/Cognition: Alert; Pleasant mood; Requires cueing; Impulsive Self-Feeding Abilities: Needs assist with self-feeding Baseline vocal quality/speech: Not observed Volitional Cough: Unable to elicit Volitional Swallow: Unable to elicit Exam Limitations: No limitations (impusivity) Goal Planning: Prognosis for improved oropharyngeal function: Fair Barriers to Reach Goals: Language deficits; Severity of deficits No data recorded Patient/Family Stated Goal: For pt to say "granny" - MET! Consulted and agree with results and recommendations: Nurse; Pt unable/family or caregiver not available Pain: Pain Assessment Pain Assessment: Faces Faces Pain Scale: 4 Breathing: 0 Negative Vocalization: 0 Facial Expression: 0 Body Language: 0 Consolability: 0 PAINAD Score: 0 Pain Location: grimacing with R UE ROM and activity in general Pain Descriptors / Indicators: Discomfort; Grimacing Pain Intervention(s): Limited activity within patient's tolerance; Monitored during session End of Session: Start Time:SLP Start Time (ACUTE ONLY): 1255 Stop Time: SLP Stop Time (ACUTE ONLY): 1315 Time Calculation:SLP Time Calculation (min) (ACUTE ONLY): 20 min Charges: SLP Evaluations $ SLP Speech Visit: 1 Visit SLP Evaluations $MBS Swallow: 1 Procedure $Swallowing Treatment: 1 Procedure $Speech  Treatment for Individual: 1 Procedure SLP visit diagnosis: SLP Visit Diagnosis: Dysphagia, oropharyngeal phase (R13.12) Past Medical History: Past Medical History: Diagnosis Date  Diabetes mellitus without  complication (HCC)  Past Surgical History: Past Surgical History: Procedure Laterality Date  TEE WITHOUT CARDIOVERSION N/A 06/16/2023  Procedure: TRANSESOPHAGEAL ECHOCARDIOGRAM (TEE);  Surgeon: Jonelle Sidle, MD;  Location: [facility];  Service: Cardiovascular;  Laterality: N/A; Yvone Neu I. Vear Clock, MS, CCC-SLP Neuro Diagnostic Specialist Acute Rehabilitation Services Office number: [phone] Scheryl Marten 06/23/2023, 3:06 PM  CT ANGIO HEAD NECK W WO CM  Result Date: 06/21/2023 CLINICAL DATA:  Stroke, determine embolic source EXAM: CT ANGIOGRAPHY HEAD AND NECK WITH AND WITHOUT CONTRAST TECHNIQUE: Multidetector CT imaging of the head and neck was performed using the standard protocol during bolus administration of intravenous contrast. Multiplanar CT image reconstructions and MIPs were obtained to evaluate the vascular anatomy. Carotid stenosis measurements (when applicable) are obtained utilizing NASCET criteria, using the distal internal carotid diameter as the denominator. RADIATION DOSE REDUCTION: This exam was performed according to the departmental dose-optimization program which includes automated exposure control, adjustment of the mA and/or kV according to patient size and/or use of iterative reconstruction technique. CONTRAST:  75mL OMNIPAQUE IOHEXOL 350 MG/ML SOLN COMPARISON:  06/14/2023 CT head, MRI head, and MRA head; 06/19/2023 MRI head FINDINGS: CT HEAD FINDINGS Brain: Hypodensity throughout the left basal ganglia, extending into the lateral thalamus, as well as in the left frontal and temporal lobes, which is more extensive than on the 06/14/2023 CT but correlates with the area of infarction on the 06/19/2023 MRI. Some increased density in the left basal ganglia correlates with petechial hemorrhage in this area. Mass effect on the left lateral ventricle, with 3 mm of right to left midline shift. A small amount of hypodensity in the right posterior limb the internal capsule correlates with  an additional acute infarct in this area. No additional area of infarct. No other parenchymal hemorrhage. No mass, hydrocephalus, or extra-axial collection. Vascular: No hyperdense vessel. Skull: Negative for fracture or focal lesion. Sinuses/Orbits: Mucosal thickening in the ethmoid air cells and frontal sinuses. No acute finding in the orbits. Other: Nasogastric tube noted. CTA NECK FINDINGS Aortic arch: Two-vessel arch with a common origin of the brachiocephalic and left common carotid arteries. Imaged portion shows no evidence of aneurysm or dissection. No significant stenosis of the major arch vessel origins. Right carotid system: No evidence of stenosis, dissection, or occlusion. Left carotid system: No evidence of stenosis, dissection, or occlusion. Vertebral arteries: Multifocal irregularity and mild-to-moderate stenosis in the left V1 and V2 segments (series 16, images 217, 255, 283, 300). Multifocal irregularity and mild stenosis in the right V1 and proximal V2 segment (series 16, image 256 and 268). The more distal V2 and V3 segments are patent bilaterally. No evidence of dissection. Skeleton: No acute osseous abnormality. Degenerative changes in the cervical spine. Other neck: No acute finding. Upper chest: Heterogeneous opacities in the dependent right upper lobe (series 16, image 44, for example), which appears increased compared to the 06/15/2023 CT chest. No pleural effusion. Review of the MIP images confirms the above findings CTA HEAD FINDINGS Anterior circulation: Both internal carotid arteries are patent to the termini, with mild stenosis in the left cavernous segment, moderate stenosis in the right supraclinoid segment, and severe stenosis in the left supraclinoid segment. Poor opacification of the left ICA terminus. A1 segments patent, hypoplastic on the left. Normal anterior communicating artery. Anterior cerebral arteries are patent to their distal aspects without  significant stenosis.  Severe, near occlusive stenosis in the left M1 (series 16, image [phone]), with poor perfusion of the proximal M2 branches. Slightly better perfusion is seen in the posterior left MCA branches, some of which may be due to collaterals. The right M1 and MCA branches are perfused to their distal aspects without significant stenosis. Posterior circulation: Severe stenosis in the left V4 (series 16, images [id number]), proximal to the PICA takeoff. The distal left V4 is patent without significant stenosis. The right V4 is patent to the vertebrobasilar junction without significant stenosis. Posteroinferior cerebellar arteries are patent bilaterally. Basilar patent to its distal aspect without significant stenosis. Superior cerebellar arteries patent proximally. Patent P1 segments. PCAs perfused to their distal aspects without significant stenosis. The bilateral posterior communicating arteries are not visualized. Venous sinuses: As permitted by contrast timing, patent. Anatomic variants: None significant. Review of the MIP images confirms the above findings IMPRESSION: 1. Evolving infarcts in the left basal ganglia, left frontal and temporal lobes, and right posterior limb of the internal capsule, with petechial hemorrhage in the left basal ganglia, as seen on the 06/19/2023 MRI. Mass effect on the left lateral ventricle with 3 mm of left-to-right midline shift. 2. Severe, near occlusive stenosis in the left M1, with poor perfusion of the proximal M2 branches, and slightly better perfusion in the posterior left MCA branches, some of which may be due to collaterals. 3. Severe stenosis in the left V4, proximal to the PICA takeoff. 4. Severe stenosis in the left supraclinoid ICA, moderate stenosis in the right supraclinoid ICA, and mild stenosis in the left cavernous ICA. 5. Multifocal irregularity and mild-to-moderate stenosis in the left V1 and V2 segments, and mild stenosis in the right V1 and proximal V2 segments. 6. No  other hemodynamically significant stenosis in the neck. 7. Heterogeneous opacities in the dependent right upper lobe, which appears increased compared to the 06/15/2023 CT chest and could represent aspiration or infection. Electronically Signed   By: Wiliam Ke M.D.   On: 06/21/2023 18:51   DG Abd Portable 1V  Result Date: 06/21/2023 CLINICAL DATA:  Feeding tube placement. EXAM: PORTABLE ABDOMEN - 1 VIEW COMPARISON:  07/31/2021 FINDINGS: Feeding tube has been placed, tip overlying the level of the distal stomach. There is contrast within the transverse colon. Visualized lungs are clear. UPPER abdomen is unremarkable. IMPRESSION: Feeding tube tip overlies the distal stomach. Electronically Signed   By: Norva Pavlov M.D.   On: 06/21/2023 10:07   DG Swallowing Func-Speech Pathology  Result Date: 06/19/2023 Table formatting from the original result was not included. Modified Barium Swallow Study Patient Details Name: Zachary Winters MRN: [id number] Date of Birth: 19-Apr-1981 Today's Date: 06/19/2023 HPI/PMH: HPI: ANTWIONE OWUSU is a 42 year old gentleman with poorly controlled diabetes mellitus on insulin, chronic daily smoker, hypertension, history of marijuana use, diabetic peripheral neuropathy, hyperlipidemia was sent to the emergency department by [facility] with right-sided weakness right-sided facial droop and difficulty speaking.  He was last known to be well 2 days ago at 6 AM when his sister noticed he went out to smoke on ring camera.  He apparently lives with his sister.  Sister noticed the patient lying on the floor having a difficult time communicating only able to nod yes and no.  She noticed that for the last 2 days he has been lying around and sleeping. He was seen in the ED and noted to have a significant aphasia and right-sided hemiparesis.  He was sent for CT  of the head with findings of moderate-sized subacute infarct in the left basal ganglia/corona radiata with probable petechial  hemorrhage. Repeat MRI 06/19/23 shows extension of stroke and other involvement (see below). MBSS requested due to change in swallow function over the weekend. Clinical Impression: Clinical Impression: Pt presents with mod/severe oral phase dysphagia and mild/mod pharyngeal phase dysphagia characterized by weak lingual movement and bolus manipulation with labial spillage, oral holding, and spillover to the pharynx. Pt with variable swallow trigger, occasionally at the pyriforms. Pt with min decreased tongue base retraction, epiglottic deflection (improved with heavier/larger bolus), and hyolaryngeal excursion resulting in one episode of silent aspiration of nectar thick liquids and consistent penetration with thins/NTL. Pt accepted bolus better when he self presented to this was implemented several times. Pt required cues to take only one sip (and liquids spilling out of oral cavity). Pt's significant cognitive communication deficits negatively impact safe and efficient PO consumption at this time. He is unlikely to meet nutritional needs in his current state, but could begin a D1/puree and HTL 1:1 in a supervised setting and oral suction available if he is transferred to inpatient rehab. For now, recommend setting up oral suction at bedside, offering single ice chips after oral care, and can present medications crushed as able in puree. Consider placement of coretrack at [facility] if alertness is an issue. Pt is at greatest risk of aspirating oral residuals that he is unable to clear (he has a difficult time initiating additional dry swallows after initial presentation- can dry dry spoon, cold stim etc). If alertness and oral phase improves, prognosis for efficient PO intake is good. Above discussed via secure chat with MD and RN. Factors that may increase risk of adverse event in presence of aspiration [name] & [name] 2021): Factors that may increase risk of adverse event in presence of aspiration [name] &  [name] 2021): Reduced cognitive function; Limited mobility; Dependence for feeding and/or oral hygiene; Inadequate oral hygiene; Weak cough Recommendations/Plan: Swallowing Evaluation Recommendations Swallowing Evaluation Recommendations Recommendations: Ice chips PRN after oral care; NPO except meds (consider coretrack at [facility] and 1:1 PO trials of puree and HTL with SLP) Liquid Administration via: Spoon; Cup Medication Administration: Crushed with puree Supervision: Full supervision/cueing for swallowing strategies; Staff to assist with self-feeding Swallowing strategies  : Slow rate; Small bites/sips; Check for pocketing or oral holding; Check for anterior loss; Multiple dry swallows after each bite/sip (have oral suction/oral care after meals) Postural changes: Position pt fully upright for meals; Stay upright 30-60 min after meals Oral care recommendations: Staff/trained caregiver to provide oral care; Oral care before ice chips/water Recommended consults: Consider dietitian consultation Caregiver Recommendations: Have oral suction available Treatment Plan Treatment Plan Treatment recommendations: Therapy as outlined in treatment plan below Follow-up recommendations: Acute inpatient rehab (3 hours/day) Functional status assessment: Patient has had a recent decline in their functional status and demonstrates the ability to make significant improvements in function in a reasonable and predictable amount of time. Treatment frequency: Min 2x/week Treatment duration: 1 week Interventions: Aspiration precaution training; Oropharyngeal exercises; Patient/family education; Trials of upgraded texture/liquids Recommendations Recommendations for follow up therapy are one component of a multi-disciplinary discharge planning process, led by the attending physician.  Recommendations may be updated based on patient status, additional functional criteria and insurance authorization. Assessment: Orofacial Exam: Orofacial Exam  Oral Cavity: Oral Hygiene: Lingual coating (halitosis) Oral Cavity - Dentition: Adequate natural dentition Orofacial Anatomy: WFL Oral Motor/Sensory Function: Suspected cranial nerve impairment CN V - Trigeminal: Not  tested CN VII - Facial: Right motor impairment CN IX - Glossopharyngeal, CN X - Vagus: Right motor impairment Anatomy: Anatomy: Suspected cervical osteophytes Boluses Administered: Boluses Administered Boluses Administered: Thin liquids (Level 0); Mildly thick liquids (Level 2, nectar thick); Moderately thick liquids (Level 3, honey thick); Puree  Oral Impairment Domain: Oral Impairment Domain Lip Closure: Escape beyond mid-chin Tongue control during bolus hold: Posterior escape of greater than half of bolus Bolus transport/lingual motion: Minimal-no tongue motion Oral residue: Majority of bolus remaining Location of oral residue : Floor of mouth; Tongue Initiation of pharyngeal swallow : Posterior laryngeal surface of the epiglottis  Pharyngeal Impairment Domain: Pharyngeal Impairment Domain Soft palate elevation: Trace column of contrast or air between SP and PW Laryngeal elevation: Partial superior movement of thyroid cartilage/partial approximation of arytenoids to epiglottic petiole Anterior hyoid excursion: Partial anterior movement Epiglottic movement: Partial inversion Laryngeal vestibule closure: Incomplete, narrow column air/contrast in laryngeal vestibule Pharyngeal stripping wave : Present - diminished Pharyngeal contraction (A/P view only): N/A Pharyngoesophageal segment opening: Partial distention/partial duration, partial obstruction of flow Tongue base retraction: Trace column of contrast or air between tongue base and PPW Pharyngeal residue: Collection of residue within or on pharyngeal structures Location of pharyngeal residue: Valleculae; Pyriform sinuses  Esophageal Impairment Domain: Esophageal Impairment Domain Esophageal clearance upright position: Complete clearance, esophageal  coating Pill: Pill Consistency administered: -- (not presented) Penetration/Aspiration Scale Score: Penetration/Aspiration Scale Score 1.  Material does not enter airway: Puree 2.  Material enters airway, remains ABOVE vocal cords then ejected out: Moderately thick liquids (Level 3, honey thick) 4.  Material enters airway, CONTACTS cords then ejected out: Thin liquids (Level 0) 7.  Material enters airway, passes BELOW cords and not ejected out despite cough attempt by patient: Mildly thick liquids (Level 2, nectar thick) Compensatory Strategies: Compensatory Strategies Compensatory strategies: No   General Information: Caregiver present: No  Diet Prior to this Study: NPO   Temperature : Normal   Respiratory Status: WFL   Supplemental O2: None (Room air)   History of Recent Intubation: No  Behavior/Cognition: Alert; Pleasant mood; Requires cueing; Impulsive Self-Feeding Abilities: Needs assist with self-feeding Baseline vocal quality/speech: Not observed Volitional Cough: Unable to elicit Volitional Swallow: Unable to elicit Exam Limitations: Excessive movement (impusivity) Goal Planning: Prognosis for improved oropharyngeal function: Fair Barriers to Reach Goals: Cognitive deficits; Language deficits; Severity of deficits No data recorded No data recorded Consulted and agree with results and recommendations: Pt unable/family or caregiver not available; Nurse; Physician Pain: Pain Assessment Pain Assessment: No/denies pain End of Session: Start Time:SLP Start Time (ACUTE ONLY): 1430 Stop Time: SLP Stop Time (ACUTE ONLY): 1503 Time Calculation:SLP Time Calculation (min) (ACUTE ONLY): 33 min Charges: SLP Evaluations $ SLP Speech Visit: 1 Visit SLP Evaluations $MBS Swallow: 1 Procedure $Swallowing Treatment: 1 Procedure SLP visit diagnosis: SLP Visit Diagnosis: Dysphagia, oropharyngeal phase (R13.12) Past Medical History: Past Medical History: Diagnosis Date  Diabetes mellitus without complication (HCC)  Past Surgical  History: No past surgical history on file. Thank you, [name], CCC-SLP [phone] PORTER,DABNEY 06/19/2023, 4:14 PM  MR BRAIN WO CONTRAST  Result Date: 06/19/2023 CLINICAL DATA:  Neuro deficit, acute, stroke suspected CT down, checking for hemorrhagic conversion, new dysphagia symptoms EXAM: MRI HEAD WITHOUT CONTRAST TECHNIQUE: Multiplanar, multiecho pulse sequences of the brain and surrounding structures were obtained without intravenous contrast. COMPARISON:  MRI June 14, 2023. FINDINGS: Brain: Interval increase in size of an acute left MCA territory infarct in the left basal ganglia and overlying frontal lobe,  including new/interval infarct in the left frontal lobe. Similar petechial hemorrhage without mass occupying acute hemorrhage. Associated edema without substantial midline shift. Small acute infarct involving the right posterior limb of the internal capsule. No hydrocephalus. Vascular: Major arterial flow voids are maintained skull base. Better evaluated on recent MRA. Skull and upper cervical spine: Normal marrow signal. Sinuses/Orbits: Mild paranasal sinus mucosal thickening. No acute orbital findings. Other: Left larger than right mastoid effusions IMPRESSION: 1. Interval increase in size of an acute left MCA territory infarct in the left basal ganglia and overlying frontal lobe, including new/interval infarct in the left frontal lobe. Similar petechial hemorrhage without mass occupying acute hemorrhage. 2. New/interval small acute infarct in the right posterior limb of the internal capsule. These results will be called to the ordering clinician or representative by the Radiologist Assistant, and communication documented in the PACS or [employer]. Electronically Signed   By: Feliberto Harts M.D.   On: 06/19/2023 12:38   ECHO TEE  Result Date: 06/16/2023    TRANSESOPHOGEAL ECHO REPORT   Patient Name:   Zachary Winters Date of Exam: 06/16/2023 Medical Rec #:  [id number]       Height:        73.0 in Accession #:    [id number]      Weight:       149.9 lb Date of Birth:  [birth year]       BSA:          1.904 m Patient Age:    42 years        BP:           172/89 mmHg Patient Gender: M               HR:           134 bpm. [facility] Procedure: Transesophageal Echo, Cardiac Doppler, Limited Color Doppler and            Saline Contrast Bubble Study Indications:    Cerebral Infarction, unspecified I63.9  History:        Patient has prior history of Echocardiogram examinations, most                 recent 06/14/2023. Stroke; Risk Factors:Current Smoker, Diabetes,                 Hypertension and Dyslipidemia.  Sonographer:    Aron Baba Referring Phys: [id number] Ellsworth Lennox PROCEDURE: After discussion of the risks and benefits of a TEE, an informed consent was obtained from the patient. The transesophogeal probe was passed without difficulty through the esophogus of the patient. Imaged were obtained with the patient in a left lateral decubitus position. Local oropharyngeal anesthetic was provided with Cetacaine. Sedation performed by different physician. The patient was monitored while under deep sedation. Anesthestetic sedation was provided intravenously by Anesthesiology: 475.1mg  of Propofol. The patient's vital signs; including heart rate, blood pressure, and oxygen saturation; remained stable throughout the procedure. The patient developed no complications during the procedure.  IMPRESSIONS  1. Left ventricular ejection fraction, by estimation, is 55 to 60%. The left ventricle has normal function. The left ventricle has no regional wall motion abnormalities. There is moderate concentric left ventricular hypertrophy.  2. Right ventricular systolic function is normal. The right ventricular size is normal.  3. No left atrial/left atrial appendage thrombus was detected. The LAA emptying velocity was 80 cm/s.  4. The mitral valve is grossly normal. Trivial mitral valve regurgitation.  5. The aortic valve is tricuspid. Aortic valve regurgitation is not visualized.  6. Agitated saline contrast bubble study was negative, with no evidence of any interatrial shunt. FINDINGS  Left Ventricle: Left ventricular ejection fraction, by estimation, is 55 to 60%. The left ventricle has normal function. The left ventricle has no regional wall motion abnormalities. The left ventricular internal cavity size was normal in size. There is  moderate concentric left ventricular hypertrophy. Right Ventricle: The right ventricular size is normal. No increase in right ventricular wall thickness. Right ventricular systolic function is normal. Left Atrium: Left atrial size was normal in size. No left atrial/left atrial appendage thrombus was detected. The LAA emptying velocity was 80 cm/s. Right Atrium: Right atrial size was normal in size. Pericardium: There is no evidence of pericardial effusion. Mitral Valve: The mitral valve is grossly normal. Trivial mitral valve regurgitation. Tricuspid Valve: The tricuspid valve is grossly normal. Tricuspid valve regurgitation is mild. Aortic Valve: The aortic valve is tricuspid. Aortic valve regurgitation is not visualized. Pulmonic Valve: The pulmonic valve was grossly normal. Pulmonic valve regurgitation is trivial. Aorta: The aortic root is normal in size and structure. There is minimal (Grade I) plaque involving the descending aorta. IAS/Shunts: No atrial level shunt detected by color flow Doppler. Agitated saline contrast was given intravenously to evaluate for intracardiac shunting. Agitated saline contrast bubble study was negative, with no evidence of any interatrial shunt. Additional Comments: Spectral Doppler performed. Nona Dell MD Electronically signed by Nona Dell MD Signature Date/Time: 06/16/2023/11:10:05 AM    Final    CT CHEST ABDOMEN PELVIS W CONTRAST  Result Date: 06/15/2023 CLINICAL DATA:  Weakness.  Evaluate for occult malignancy. EXAM: CT CHEST,  ABDOMEN, AND PELVIS WITH CONTRAST TECHNIQUE: Multidetector CT imaging of the chest, abdomen and pelvis was performed following the standard protocol during bolus administration of intravenous contrast. RADIATION DOSE REDUCTION: This exam was performed according to the departmental dose-optimization program which includes automated exposure control, adjustment of the mA and/or kV according to patient size and/or use of iterative reconstruction technique. CONTRAST:  OMNIPAQUE IOHEXOL 300 MG/ML  SOLN COMPARISON:  CT scan 04/18/2021 FINDINGS: CT CHEST FINDINGS Cardiovascular: The heart is normal in size. No pericardial effusion. The aorta is normal in caliber. Significant age advanced three-vessel coronary artery calcifications. Mediastinum/Nodes: No mediastinal or hilar mass or lymphadenopathy. Small scattered lymph nodes are noted. The esophagus is grossly normal. Lungs/Pleura: No worrisome pulmonary lesions or pulmonary nodules. No pleural effusions or pleural nodules. Right upper lobe predominant tree-in-bud pattern suggesting chronic inflammation atypical infection such as MAC. No focal airspace consolidation, pulmonary edema or pleural effusions. The central tracheobronchial tree is unremarkable. Musculoskeletal: Mild symmetric bilateral gynecomastia. No lytic or sclerotic bone lesions. CT ABDOMEN PELVIS FINDINGS Hepatobiliary: No focal hepatic lesions or intrahepatic biliary dilatation. Layering sludge or dependent gallstones in the gallbladder no common bile duct dilatation. Pancreas: Normal Spleen: Normal Adrenals/Urinary Tract: Normal Stomach/Bowel: No significant findings. Vascular/Lymphatic: The aorta is normal in caliber. No dissection. Age advanced atherosclerotic calcifications involving the aorta and iliac arteries. The branch vessels are patent. The major venous structures are patent. No mesenteric or retroperitoneal mass or adenopathy. Small scattered lymph nodes are noted. Reproductive: The  prostate gland and seminal vesicles are unremarkable. Other: No pelvic mass or adenopathy. No free pelvic fluid collections. No inguinal mass or adenopathy. No abdominal wall hernia or subcutaneous lesions. Musculoskeletal: No significant bony findings. IMPRESSION: 1. No acute abdominal/pelvic findings, mass lesions or adenopathy. 2. Age advanced three-vessel coronary artery  calcifications. 3. Right upper lobe predominant tree-in-bud pattern suggesting chronic inflammation atypical infection such as MAC. 4. Layering sludge or dependent gallstones in the gallbladder. 5. Age advanced atherosclerotic calcifications involving the aorta and iliac arteries. Aortic Atherosclerosis (ICD10-I70.0). Electronically Signed   By: Rudie Meyer M.D.   On: 06/15/2023 13:23   ECHOCARDIOGRAM COMPLETE  Result Date: 06/14/2023    ECHOCARDIOGRAM REPORT   Patient Name:   Zachary Winters Date of Exam: 06/14/2023 Medical Rec #:  [id number]       Height:       73.0 in Accession #:    [id number]      Weight:       149.9 lb Date of Birth:  [birth year]       BSA:          1.904 m Patient Age:    42 years        BP:           180/92 mmHg Patient Gender: M               HR:           77 bpm. [facility] Procedure: 2D Echo, Cardiac Doppler and Color Doppler Indications:    Stroke I63.9  History:        Patient has no prior history of Echocardiogram examinations.                 Risk Factors:Hypertension, Diabetes, Dyslipidemia and Current                 Smoker.  Sonographer:    Celesta Gentile RCS Referring Phys: [phone] CLANFORD L JOHNSON IMPRESSIONS  1. Left ventricular ejection fraction, by estimation, is 60 to 65%. The left ventricle has normal function. The left ventricle has no regional wall motion abnormalities. There is moderate concentric left ventricular hypertrophy. Left ventricular diastolic parameters are consistent with Grade I diastolic dysfunction (impaired relaxation).  2. Right ventricular systolic function is  normal. The right ventricular size is normal. Tricuspid regurgitation signal is inadequate for assessing PA pressure.  3. The mitral valve is grossly normal. Trivial mitral valve regurgitation.  4. The aortic valve is tricuspid. There is mild calcification of the aortic valve. Aortic valve regurgitation is not visualized. Aortic valve sclerosis is present, with no evidence of aortic valve stenosis. Aortic valve mean gradient measures 5.0 mmHg.  5. The inferior vena cava is normal in size with greater than 50% respiratory variability, suggesting right atrial pressure of 3 mmHg. Comparison(s): No prior Echocardiogram. FINDINGS  Left Ventricle: Left ventricular ejection fraction, by estimation, is 60 to 65%. The left ventricle has normal function. The left ventricle has no regional wall motion abnormalities. Definity contrast agent was given IV to delineate the left ventricular  endocardial borders. The left ventricular internal cavity size was normal in size. There is moderate concentric left ventricular hypertrophy. Left ventricular diastolic parameters are consistent with Grade I diastolic dysfunction (impaired relaxation). Right Ventricle: The right ventricular size is normal. No increase in right ventricular wall thickness. Right ventricular systolic function is normal. Tricuspid regurgitation signal is inadequate for assessing PA pressure. Left Atrium: Left atrial size was normal in size. Right Atrium: Right atrial size was normal in size. Pericardium: There is no evidence of pericardial effusion. Mitral Valve: The mitral valve is grossly normal. Trivial mitral valve regurgitation. Tricuspid Valve: The tricuspid valve is grossly normal. Tricuspid valve regurgitation is trivial. Aortic Valve: The aortic valve is tricuspid.  There is mild calcification of the aortic valve. Aortic valve regurgitation is not visualized. Aortic valve sclerosis is present, with no evidence of aortic valve stenosis. Aortic valve mean  gradient measures 5.0 mmHg. Aortic valve peak gradient measures 12.7 mmHg. Aortic valve area, by VTI measures 1.55 cm. Pulmonic Valve: The pulmonic valve was grossly normal. Pulmonic valve regurgitation is trivial. Aorta: The aortic root is normal in size and structure. Venous: The inferior vena cava is normal in size with greater than 50% respiratory variability, suggesting right atrial pressure of 3 mmHg. IAS/Shunts: No atrial level shunt detected by color flow Doppler.  LEFT VENTRICLE PLAX 2D LVIDd:         4.40 cm   Diastology LVIDs:         3.20 cm   LV e' medial:    5.33 cm/s LV PW:         1.40 cm   LV E/e' medial:  10.6 LV IVS:        1.30 cm   LV e' lateral:   7.72 cm/s LVOT diam:     1.90 cm   LV E/e' lateral: 7.3 LV SV:         43 LV SV Index:   22 LVOT Area:     2.84 cm  RIGHT VENTRICLE RV S prime:     11.40 cm/s TAPSE (M-mode): 2.1 cm LEFT ATRIUM             Index        RIGHT ATRIUM           Index LA diam:        3.00 cm 1.58 cm/m   RA Area:     12.50 cm LA Vol (A2C):   63.1 ml 33.15 ml/m  RA Volume:   30.80 ml  16.18 ml/m LA Vol (A4C):   36.0 ml 18.91 ml/m LA Biplane Vol: 48.1 ml 25.27 ml/m  AORTIC VALVE AV Area (Vmax):    1.45 cm AV Area (Vmean):   1.67 cm AV Area (VTI):     1.55 cm AV Vmax:           178.00 cm/s AV Vmean:          102.000 cm/s AV VTI:            0.277 m AV Peak Grad:      12.7 mmHg AV Mean Grad:      5.0 mmHg LVOT Vmax:         91.20 cm/s LVOT Vmean:        60.000 cm/s LVOT VTI:          0.151 m LVOT/AV VTI ratio: 0.55  AORTA Ao Root diam: 3.30 cm MITRAL VALVE MV Area (PHT): 3.37 cm    SHUNTS MV Decel Time: 225 msec    Systemic VTI:  0.15 m MV E velocity: 56.30 cm/s  Systemic Diam: 1.90 cm MV A velocity: 71.90 cm/s MV E/A ratio:  0.78 Nona Dell MD Electronically signed by Nona Dell MD Signature Date/Time: 06/14/2023/5:02:28 PM    Final    MR BRAIN WO CONTRAST  Result Date: 06/14/2023 CLINICAL DATA:  Neuro deficit with acute stroke suspected EXAM: MRI HEAD  WITHOUT CONTRAST MRA HEAD WITHOUT CONTRAST TECHNIQUE: Multiplanar, multi-echo pulse sequences of the brain and surrounding structures were acquired without intravenous contrast. Angiographic images of the Circle of Willis were acquired using MRA technique without intravenous contrast. COMPARISON:  Head CT from earlier today FINDINGS: MRI HEAD FINDINGS  Brain: Acute perforator infarct affecting the left basal ganglia with central blood products that is petechial hemorrhage when correlated with CT. No hydrocephalus or extra-axial collection. Negative for mass Vascular: Major flow voids are preserved. Skull and upper cervical spine: Normal marrow signal Sinuses/Orbits: Left mastoid opacification with negative nasopharynx. MRA HEAD FINDINGS Anterior circulation: The left ICA is smaller than the right. This may be partially due to circle-of-Willis variation given the hypoplastic potential of left A1 segment, but may also be related to underfilling in the setting of ipsilateral stenoses. Advanced stenosis or short segment occlusion at the left M1 segment with poorly visualized downstream branch vessel flow. Atheromatous irregularity diffusely affects intracranial branches. No detected aneurysm. Posterior circulation: At least moderate stenosis of the non dominant left vertebral artery just before the PICA. The basilar is widely patent. Posterior cerebral arteries are smoothly contoured and widely patent. Negative for aneurysm These results will be called to the ordering clinician or representative by the Radiologist Assistant, and communication documented in the PACS or [employer]. Prelim sent in epic chat. IMPRESSION: 1. Acute to subacute perforator infarct at the left basal ganglia and adjacent white matter with petechial hemorrhage. 2. Underline severe stenosis versus segmental occlusion at the left M1 segment. 3. The left ICA is smaller than the right and neck arterial imaging is recommended to exclude proximal  stenosis with underfilling. Electronically Signed   By: Tiburcio Pea M.D.   On: 06/14/2023 12:44   MR ANGIO HEAD WO CONTRAST  Result Date: 06/14/2023 CLINICAL DATA:  Neuro deficit with acute stroke suspected EXAM: MRI HEAD WITHOUT CONTRAST MRA HEAD WITHOUT CONTRAST TECHNIQUE: Multiplanar, multi-echo pulse sequences of the brain and surrounding structures were acquired without intravenous contrast. Angiographic images of the Circle of Willis were acquired using MRA technique without intravenous contrast. COMPARISON:  Head CT from earlier today FINDINGS: MRI HEAD FINDINGS Brain: Acute perforator infarct affecting the left basal ganglia with central blood products that is petechial hemorrhage when correlated with CT. No hydrocephalus or extra-axial collection. Negative for mass Vascular: Major flow voids are preserved. Skull and upper cervical spine: Normal marrow signal Sinuses/Orbits: Left mastoid opacification with negative nasopharynx. MRA HEAD FINDINGS Anterior circulation: The left ICA is smaller than the right. This may be partially due to circle-of-Willis variation given the hypoplastic potential of left A1 segment, but may also be related to underfilling in the setting of ipsilateral stenoses. Advanced stenosis or short segment occlusion at the left M1 segment with poorly visualized downstream branch vessel flow. Atheromatous irregularity diffusely affects intracranial branches. No detected aneurysm. Posterior circulation: At least moderate stenosis of the non dominant left vertebral artery just before the PICA. The basilar is widely patent. Posterior cerebral arteries are smoothly contoured and widely patent. Negative for aneurysm These results will be called to the ordering clinician or representative by the Radiologist Assistant, and communication documented in the PACS or [employer]. Prelim sent in epic chat. IMPRESSION: 1. Acute to subacute perforator infarct at the left basal ganglia and  adjacent white matter with petechial hemorrhage. 2. Underline severe stenosis versus segmental occlusion at the left M1 segment. 3. The left ICA is smaller than the right and neck arterial imaging is recommended to exclude proximal stenosis with underfilling. Electronically Signed   By: Tiburcio Pea M.D.   On: 06/14/2023 12:44   US Carotid Bilateral (at [facility] and [facility] only)  Result Date: 06/14/2023 CLINICAL DATA:  42 year old male with history of ischemic stroke. EXAM: BILATERAL CAROTID DUPLEX ULTRASOUND TECHNIQUE: Wallace Cullens  scale imaging, color Doppler and duplex ultrasound were performed of bilateral carotid and vertebral arteries in the neck. COMPARISON:  None Available. FINDINGS: Criteria: Quantification of carotid stenosis is based on velocity parameters that correlate the residual internal carotid diameter with NASCET-based stenosis levels, using the diameter of the distal internal carotid lumen as the denominator for stenosis measurement. The following velocity measurements were obtained: RIGHT ICA: Peak systolic velocity 83 cm/sec, End diastolic velocity 25 cm/sec CCA: Peak systolic velocity 98 cm/sec SYSTOLIC ICA/CCA RATIO:  0.8 ECA: Peak systolic velocity 85 cm/sec LEFT ICA: Peak systolic velocity 42 cm/sec, End diastolic velocity 16 cm/sec CCA: 77 cm/sec SYSTOLIC ICA/CCA RATIO:  0.5 ECA: 89 cm/sec RIGHT CAROTID ARTERY: No atherosclerotic plaque formation. No significant tortuosity. Pulsus bisferians is noted, low resistance waveforms. RIGHT VERTEBRAL ARTERY:  Antegrade flow. LEFT CAROTID ARTERY: No atherosclerotic plaque formation. No significant tortuosity. Pulsus bisferians is noted, low resistance waveforms. LEFT VERTEBRAL ARTERY:  Antegrade flow. Upper extremity non-invasive blood pressures: Not obtained. IMPRESSION: 1. Right carotid artery system: Patent without significant atherosclerotic plaque formation. 2. Left carotid artery system: Patent without significant atherosclerotic plaque formation. 3.   Vertebral artery system: Patent with antegrade flow bilaterally. 4. 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 Correlate with recently obtained echocardiogram. Marliss Coots, MD [facility] Electronically Signed   By: Marliss Coots M.D.   On: 06/14/2023 12:37   CT HEAD WO CONTRAST  Result Date: 06/14/2023 CLINICAL DATA:  Right-sided weakness and facial droop. EXAM: CT HEAD WITHOUT CONTRAST TECHNIQUE: Contiguous axial images were obtained from the base of the skull through the vertex without intravenous contrast. RADIATION DOSE REDUCTION: This exam was performed according to the departmental dose-optimization program which includes automated exposure control, adjustment of the mA and/or kV according to patient size and/or use of iterative reconstruction technique. COMPARISON:  CT head 03/14/2021. FINDINGS: Brain: There is hypodensity in the left internal capsule, lentiform nucleus, external capsule, and corona radiata consistent with subacute infarct. There is likely petechial hemorrhage within the infarct (2-17). There is mild edema with partial effacement of the body of the left lateral ventricle but no midline shift. There is no other evidence of acute intracranial hemorrhage or extra-axial fluid collection. Background parenchymal volume is normal. The ventricles are otherwise normal in size. The pituitary and suprasellar region are normal. There is no solid mass lesion. Vascular: There is calcification of the bilateral carotid siphons. Skull: Normal. Negative for fracture or focal lesion. Sinuses/Orbits: The paranasal sinuses are clear. There is a remote left orbital floor fracture. The globes and orbits are otherwise unremarkable. Other: The mastoid air cells and middle ear cavities are clear. IMPRESSION: Moderate-sized subacute infarct in the left basal ganglia/corona  radiata with probable petechial hemorrhage. Mild regional mass effect but no midline shift. Critical Value/emergent results were called by telephone at the time of interpretation on 06/14/2023 at 9:53 am to provider Pricilla Loveless , who verbally acknowledged these results. Electronically Signed   By: Lesia Hausen M.D.   On: 06/14/2023 09:53    Labs: BNP (last 3 results) No results for input(s): "BNP" in the last 8760 hours. Basic Metabolic Panel: Recent Labs  Lab 06/21/23 1721 06/22/23 0713 06/22/23 1631 06/23/23 0628 06/24/23 0358 06/25/23 0549 06/26/23 0708  NA  --  143  --  143 143 140 141  K  --  3.6  --  3.4* 3.6 3.7 3.8  CL  --  112*  --  109 110 105 104  CO2  --  23  --  24 25 27 28   GLUCOSE  --  136*  --  236* 199* 291* 275*  BUN  --  7  --  9 9 13 15   CREATININE  --  1.27*  --  1.29* 1.26* 1.37* 1.31*  CALCIUM  --  8.6*  --  8.6* 8.5* 8.6* 8.4*  MG 2.0 1.8 1.8 1.7  --   --   --   PHOS 2.8 2.7 2.9 3.8  --   --   --    Liver Function Tests: No results for input(s): "AST", "ALT", "ALKPHOS", "BILITOT", "PROT", "ALBUMIN" in the last 168 hours. No results for input(s): "LIPASE", "AMYLASE" in the last 168 hours. No results for input(s): "AMMONIA" in the last 168 hours. CBC: Recent Labs  Lab 06/21/23 0743 06/26/23 0708  WBC 9.8 10.5  HGB 9.2* 9.2*  HCT 27.9* 28.5*  MCV 88.9 89.9  PLT 252 270   Cardiac Enzymes: No results for input(s): "CKTOTAL", "CKMB", "CKMBINDEX", "TROPONINI" in the last 168 hours. BNP: Invalid input(s): "POCBNP" CBG: Recent Labs  Lab 06/26/23 1632 06/26/23 2051 06/27/23 0021 06/27/23 0428 06/27/23 0847  GLUCAP 134* 88 93 117* 122*   D-Dimer No results for input(s): "DDIMER" in the last 72 hours. Hgb A1c No results for input(s): "HGBA1C" in the last 72 hours. Lipid Profile No results for input(s): "CHOL", "HDL", "LDLCALC", "TRIG", "CHOLHDL", "LDLDIRECT" in the last 72 hours. Thyroid function studies No results for input(s): "TSH",  "T4TOTAL", "T3FREE", "THYROIDAB" in the last 72 hours.  Invalid input(s): "FREET3" Anemia work up No results for input(s): "VITAMINB12", "FOLATE", "FERRITIN", "TIBC", "IRON", "RETICCTPCT" in the last 72 hours. Urinalysis    Component Value Date/Time   COLORURINE YELLOW 06/15/2023 1345   APPEARANCEUR HAZY (A) 06/15/2023 1345   LABSPEC 1.025 06/15/2023 1345   PHURINE 5.0 06/15/2023 1345   GLUCOSEU 150 (A) 06/15/2023 1345   HGBUR NEGATIVE 06/15/2023 1345   BILIRUBINUR NEGATIVE 06/15/2023 1345   KETONESUR 5 (A) 06/15/2023 1345   PROTEINUR >=300 (A) 06/15/2023 1345   NITRITE NEGATIVE 06/15/2023 1345   LEUKOCYTESUR NEGATIVE 06/15/2023 1345   Sepsis Labs Recent Labs  Lab 06/21/23 0743 06/26/23 0708  WBC 9.8 10.5   Microbiology No results found for this or any previous visit (from the past 240 hour(s)).  Time coordinating discharge: 25 minutes  SIGNED: Lanae Boast, MD  [facility] 06/27/2023, 1:21 PM  If 7PM-7AM, please contact night-coverage [email or web address]

## 2023-06-27 NOTE — Significant Event (Signed)
Patient discharged to inpatient rehab room [phone], taken via bed. Only personal belonging is a Public affairs consultant which is taken to patient's new room. RN called patient's grandmother, who is currently at bedside there now. Receiving staff made aware of patient's arrival.

## 2023-06-28 ENCOUNTER — Inpatient Hospital Stay (HOSPITAL_COMMUNITY): Payer: Medicaid Other

## 2023-06-28 DIAGNOSIS — I639 Cerebral infarction, unspecified: Secondary | ICD-10-CM | POA: Diagnosis not present

## 2023-06-28 DIAGNOSIS — I63512 Cerebral infarction due to unspecified occlusion or stenosis of left middle cerebral artery: Secondary | ICD-10-CM | POA: Diagnosis not present

## 2023-06-28 LAB — GLUCOSE, CAPILLARY
Glucose-Capillary: 156 mg/dL — ABNORMAL HIGH (ref 70–99)
Glucose-Capillary: 268 mg/dL — ABNORMAL HIGH (ref 70–99)
Glucose-Capillary: 347 mg/dL — ABNORMAL HIGH (ref 70–99)
Glucose-Capillary: 296 mg/dL — ABNORMAL HIGH (ref 70–99)

## 2023-06-28 LAB — COMPREHENSIVE METABOLIC PANEL
ALT: 24 U/L (ref 0–44)
AST: 28 U/L (ref 15–41)
Albumin: 2.6 g/dL — ABNORMAL LOW (ref 3.5–5.0)
Alkaline Phosphatase: 78 U/L (ref 38–126)
Anion gap: 9 (ref 5–15)
BUN: 12 mg/dL (ref 6–20)
CO2: 28 mmol/L (ref 22–32)
Calcium: 8.7 mg/dL — ABNORMAL LOW (ref 8.9–10.3)
Chloride: 101 mmol/L (ref 98–111)
Creatinine, Ser: 1.56 mg/dL — ABNORMAL HIGH (ref 0.61–1.24)
GFR, Estimated: 57 mL/min — ABNORMAL LOW (ref >60)
Glucose, Bld: 179 mg/dL — ABNORMAL HIGH (ref 70–99)
Potassium: 4.1 mmol/L (ref 3.5–5.1)
Sodium: 138 mmol/L (ref 135–145)
Total Bilirubin: 0.6 mg/dL (ref 0.3–1.2)
Total Protein: 6.8 g/dL (ref 6.5–8.1)

## 2023-06-28 LAB — CBC WITH DIFFERENTIAL/PLATELET
Abs Immature Granulocytes: 0.04 10*3/uL (ref 0.00–0.07)
Basophils Absolute: 0.1 10*3/uL (ref 0.0–0.1)
Basophils Relative: 1 %
Eosinophils Absolute: 0.1 10*3/uL (ref 0.0–0.5)
Eosinophils Relative: 1 %
HCT: 30.4 % — ABNORMAL LOW (ref 39.0–52.0)
Hemoglobin: 10 g/dL — ABNORMAL LOW (ref 13.0–17.0)
Immature Granulocytes: 0 %
Lymphocytes Relative: 18 %
Lymphs Abs: 1.8 10*3/uL (ref 0.7–4.0)
MCH: 29.2 pg (ref 26.0–34.0)
MCHC: 32.9 g/dL (ref 30.0–36.0)
MCV: 88.9 fL (ref 80.0–100.0)
Monocytes Absolute: 0.6 10*3/uL (ref 0.1–1.0)
Monocytes Relative: 6 %
Neutro Abs: 7.6 10*3/uL (ref 1.7–7.7)
Neutrophils Relative %: 74 %
Platelets: 295 10*3/uL (ref 150–400)
RBC: 3.42 MIL/uL — ABNORMAL LOW (ref 4.22–5.81)
RDW: 12.9 % (ref 11.5–15.5)
WBC: 10.2 10*3/uL (ref 4.0–10.5)
nRBC: 0 % (ref 0.0–0.2)

## 2023-06-28 MED ORDER — INSULIN ASPART 100 UNIT/ML IJ SOLN
0.0000 [IU] | Freq: Three times a day (TID) | INTRAMUSCULAR | Status: DC
  Administered 2023-06-29: 3 [IU] via SUBCUTANEOUS

## 2023-06-28 MED ORDER — ENSURE MAX PROTEIN PO LIQD
11.0000 [oz_av] | Freq: Three times a day (TID) | ORAL | Status: DC
  Administered 2023-06-28 – 2023-06-29 (×2): 11 [oz_av] via ORAL

## 2023-06-28 MED ORDER — INSULIN ASPART 100 UNIT/ML IJ SOLN
0.0000 [IU] | Freq: Every day | INTRAMUSCULAR | Status: DC
  Administered 2023-06-28: 3 [IU] via SUBCUTANEOUS

## 2023-06-28 NOTE — Progress Notes (Signed)
Inpatient Rehabilitation Care Coordinator Assessment and Plan Patient Details  Name: Zachary Winters MRN: [id number] Date of Birth: 03/04/1981  Today's Date: 06/28/2023  Hospital Problems: Principal Problem:   Acute ischemic left middle cerebral artery (MCA) stroke [facility])  Past Medical History:  Past Medical History:  Diagnosis Date   Diabetes mellitus without complication [facility])    Past Surgical History:  Past Surgical History:  Procedure Laterality Date   TEE WITHOUT CARDIOVERSION N/A 06/16/2023   Procedure: TRANSESOPHAGEAL ECHOCARDIOGRAM (TEE);  Surgeon: Jonelle Sidle, MD;  Location: [facility];  Service: Cardiovascular;  Laterality: N/A;   Social History:  reports that he has been smoking cigarettes. He has a 5.00 pack-year smoking history. He has never used smokeless tobacco. He reports current alcohol use. He reports current drug use. Frequency: 2.00 times per week. Drug: Marijuana.  Family / Support Systems Marital Status: Single Spouse/Significant Other: n/a Children: n/a Other Supports: Sister, Scientist, forensic and Step dad, Civil engineer, contracting Anticipated Caregiver: Sister, Scientist, forensic and Step dad, Andrey Campanile (when sister works) Ability/Limitations of Caregiver: Sister works Medical laboratory scientific officer: 24/7 Family Dynamics: support from family  Social History Preferred language: English Religion:  Education: HS Health Literacy - How often do you need to have someone help you when you read instructions, pamphlets, or other written material from your doctor or pharmacy?: Patient unable to respond Writes: Yes Legal History/Current Legal Issues: n/a Guardian/Conservator: n/a   Abuse/Neglect Abuse/Neglect Assessment Can Be Completed: Unable to assess, patient is non-responsive or altered mental status Physical Abuse: Denies Verbal Abuse: Denies Sexual Abuse: Denies Exploitation of patient/patient's resources: Denies Self-Neglect: Denies  Patient response to: Social Isolation - How often do you feel  lonely or isolated from those around you?: Patient unable to respond  Emotional Status Pt's affect, behavior and adjustment status: Pleasant Recent Psychosocial Issues: Coping Psychiatric History: n/a Substance Abuse History: Smoking, marijuana use  Patient / Family Perceptions, Expectations & Goals Pt/Family understanding of illness & functional limitations: yes Premorbid pt/family roles/activities: Independent overall Anticipated changes in roles/activities/participation: Sister works and ab;e to assist some when home. Step father to asssit when sister working Pt/family expectations/goals: Supervision to [employer]: None Premorbid Home Care/DME Agencies: None Transportation available at discharge: Sister or otherfamily members per sister Is the patient able to respond to transportation needs?: No Resource referrals recommended: Neuropsychology  Discharge Planning Living Arrangements: Other relatives Support Systems: Other relatives, Parent Type of Residence: Private residence (1 level home, level entry) Insurance Resources: [employer] (specify county) Architect: Garment/textile technologist Screen Referred: No Living Expenses: Lives with family Money Management: Patient, Family Does the patient have any problems obtaining your medications?: No Home Management: Independent Patient/Family Preliminary Plans: Sister plans to asssit with managing cogntive tasks Care Coordinator Barriers to Discharge: Insurance for SNF coverage, Decreased caregiver support, Lack of/limited family support Expected length of stay: 10-14 Days  Clinical Impression Sw met with patient and spoke with sister, Zachary Winters. SW introduced self and explained role. Patient discharging home wit assistance from sister and step father. Sw expressed that we are still currently evaluating and a d/c date will be determined next week. No additional questions or concerns.   Patient  discharging home with sister who works. Step father to assist when sister works. NO DME. 1 level home, level entry.   Andria Rhein 06/28/2023, 1:45 PM

## 2023-06-28 NOTE — Progress Notes (Signed)
Patient ID: Zachary Winters, male   DOB: 17-Dec-1981, 42 y.o.   MRN: [id number]  Team Conference Report to Patient/Family  Team Conference discussion was reviewed with the patient and caregiver, including goals, any changes in plan of care and target discharge date.  Patient and caregiver express understanding and are in agreement.  The patient has a target discharge date of  (Evals pending).   Sw met with patient and spoke with sister, [name]. SW introduced self and explained role. Patient discharging home wit assistance from sister and step father. Sw expressed that we are still currently evaluating and a d/c date will be determined next week. No additional questions or concerns.  Andria Rhein 06/28/2023, 1:17 PM

## 2023-06-28 NOTE — Progress Notes (Signed)
BLE venous duplex has been completed.     Results can be found under chart review under CV PROC. 06/28/2023 4:30 PM Medhansh Brinkmeier RVT, RDMS

## 2023-06-28 NOTE — Plan of Care (Signed)
  Problem: RH Swallowing Goal: LTG Patient will consume least restrictive diet using compensatory strategies with assistance (SLP) Description: LTG:  Patient will consume least restrictive diet using compensatory strategies with assistance (SLP) Flowsheets (Taken 06/28/2023 1156) LTG: Pt Patient will consume least restrictive diet using compensatory strategies with assistance of (SLP): Minimal Assistance - Patient > 75% Goal: LTG Patient will participate in dysphagia therapy to increase swallow function with assistance (SLP) Description: LTG:  Patient will participate in dysphagia therapy to increase swallow function with assistance (SLP) Flowsheets (Taken 06/28/2023 1156) LTG: Pt will participate in dysphagia therapy to increase swallow function with assistance of (SLP): Minimal Assistance - Patient > 75% Goal: LTG Pt will demonstrate functional change in swallow as evidenced by bedside/clinical objective assessment (SLP) Description: LTG: Patient will demonstrate functional change in swallow as evidenced by bedside/clinical objective assessment (SLP) Flowsheets (Taken 06/28/2023 1156) LTG: Patient will demonstrate functional change in swallow as evidenced by bedside/clinical objective assessment: Oropharyngeal swallow   Problem: RH Comprehension Communication Goal: LTG Patient will comprehend basic/complex auditory (SLP) Description: LTG: Patient will comprehend basic/complex auditory information with cues (SLP). Flowsheets (Taken 06/28/2023 1156) LTG: Patient will comprehend auditory information with cueing (SLP): Minimal Assistance - Patient > 75%   Problem: RH Expression Communication Goal: LTG Patient will express needs/wants via multi-modal(SLP) Description: LTG:  Patient will express needs/wants via multi-modal communication (gestures/written, etc) with cues (SLP) Flowsheets (Taken 06/28/2023 1156) LTG: Patient will express needs/wants via multimodal communication (gestures/written, etc) with  cueing (SLP): Minimal Assistance - Patient > 75% Goal: LTG Patient will verbally express basic/complex needs(SLP) Description: LTG:  Patient will verbally express basic/complex needs, wants or ideas with cues  (SLP) Flowsheets (Taken 06/28/2023 1156) LTG: Patient will verbally express basic/complex needs, wants or ideas (SLP): Minimal Assistance - Patient > 75%

## 2023-06-28 NOTE — Evaluation (Signed)
Speech Language Pathology Assessment and Plan  Patient Details  Name: Zachary Winters MRN: [id number] Date of Birth: 04-25-1981  SLP Diagnosis: Aphasia;Dysarthria;Apraxia;Speech and Language deficits;Dysphagia  Rehab Potential: Good ELOS: 18-22 days    Today's Date: 06/28/2023 SLP Individual Time: 1027-2536 SLP Individual Time Calculation (min): 57 min   Hospital Problem: Principal Problem:   Acute ischemic left middle cerebral artery (MCA) stroke (HCC)  Past Medical History:  Past Medical History:  Diagnosis Date   Diabetes mellitus without complication (HCC)    Past Surgical History:  Past Surgical History:  Procedure Laterality Date   TEE WITHOUT CARDIOVERSION N/A 06/16/2023   Procedure: TRANSESOPHAGEAL ECHOCARDIOGRAM (TEE);  Surgeon: Jonelle Sidle, MD;  Location: [facility];  Service: Cardiovascular;  Laterality: N/A;    Assessment / Plan / Recommendation Clinical Impression  Pt is a 42 year old male who presented with right-sided weakness, right-sided facial droop and difficulty speaking. 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 BSE 6/19 with minimal R anterior spillage, decreased coordination and R anterior spillage; dysphagia 2 diet with thin liquids recommended. 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 Similar petechial hemorrhage without mass occupying acute hemorrhage. New/interval small acute infarct in the right posterior limb of the internal capsule. MBS 6/24: mod/severe oral phase dysphagia and mild/mod pharyngeal phase dysphagia characterized by weak lingual movement and bolus manipulation with labial spillage, oral holding, and spillover to the pharynx. An NPO status recommended with snacks of puree and honey  thick liquids. PMH: diabetes mellitus on insulin, chronic daily smoker, hypertension, history of marijuana use, diabetic peripheral neuropathy, hyperlipidemia    Skilled Therapeutic Interventions          Pt seen for clinical swallow assessment and informal speech/language assessment. Most recent MBSS completed on 06/23/23 with recommendation of a puree diet and honey-thick liquids. Pt did exhibit inconsistent aspiration of nectar-thick liquids, though responses to aspiration were audible. No aspiration was observed of thin liquids; however, only small amount of thin liquids were swallowed due to significance of posterior spillage.   Today, pt presents with a mild-moderate oral dysphagia and ongoing s/s of a pharyngeal dysphagia based on clinical swallow assessment. Oral deficits characterized by reduced labial and lingual strength with weakness greater on R side. This resulted in anterior spillage of liquids by cup and applesauce from R side and min scattered oral residue following soft solid. Residue cleared with liquid wash. Improved oral control of liquids observed with straw use, though pt did require + time to siphon liquids by straw. Pharyngeal swallow initiation appeared prompt with laryngeal elevation noted. Observed x1 delayed, dry cough following cup sip of thin liquids. Unable to confirm or rule out instances of airway violation at bedside. No other overt or subtle s/s of aspiration were observed including following straw sips of thin liquids.   Recommend repeat MBSS when it can be scheduled to objectively assess pt's tolerance of liquids and advance as indicated. Continue puree diet and honey-thick liquids (straw ok). Evaluate pt for solid advancement with Dys 2 trial tray. This may aid his oral intake.   Speech/Language Pt presents with a nonfluent expressive and receptive aphasia and concern for apraxia of speech or dysarthria or mix of these two motor speech disorders. 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 Pt demonstrated ~80% accuracy with basic biographical and environmental Y/N questions. He is able to verbalize an approximation of his first name and intelligible approximations of counting 1-10. Intelligibility reduced for numbers 11-20 and days of the week. Pt unable to follow 1-step commands. Identified object in f=2 with max cues of gestures + word. Pt verbalized body parts: nose (with model), ears (with model), and eyes (spontaneous). Consider trial of simple communication board.   Pt left sitting upright in bed with call bell in reach and bed alarm activated. Grandmother at bedside and updated on pt's progress and PoC. Continue SLP PoC.   Swallowing:   SLP Assessment  Patient will need skilled Speech Lanaguage Pathology Services during CIR admission    Recommendations  SLP Diet Recommendations: Dysphagia 1 (Puree);Honey Liquid Administration via: Cup;Straw Medication Administration: Crushed with puree Compensations: Slow rate;Small sips/bites Postural Changes and/or Swallow Maneuvers: Seated upright 90 degrees Oral Care Recommendations: Oral care QID Patient destination: Home Follow up Recommendations: Home Health SLP;Outpatient SLP;24 hour supervision/assistance Equipment Recommended: None recommended by SLP    SLP Frequency 3 to 5 out of 7 days   SLP Duration  SLP Intensity  SLP Treatment/Interventions 18-22 days  Minumum of 1-2 x/day, 30 to 90 minutes  Speech/Language facilitation;Therapeutic Activities;Cueing hierarchy;Therapeutic Exercise;Dysphagia/aspiration precaution training;Patient/family education    Pain Pain Assessment Pain Scale: 0-10 Pain Score: 0-No pain Pain Type: Acute pain Pain Location: Head Pain Descriptors / Indicators: Aching Pain Frequency: Intermittent Pain Intervention(s): Medication (See eMAR)  Prior Functioning Cognitive/Linguistic Baseline: Within functional limits Type of Home: House  Lives With:  Family  SLP Evaluation Cognition Overall Cognitive Status: Difficult to assess (secondary to global aphasia) Arousal/Alertness: Awake/alert Orientation Level: Oriented to person;Oriented to place (based on Y/N responses) Year:  (responded "no" when asked if it was 2024) Month: July (based on Y/N)  Comprehension Auditory Comprehension Overall Auditory Comprehension: Impaired Yes/No Questions: Impaired Basic Biographical Questions: 76-100% accurate Basic Immediate Environment Questions: 75-100% accurate Commands: Impaired One Step Basic Commands: 0-24% accurate Visual Recognition/Discrimination Discrimination: Not tested Reading Comprehension Reading Status: Not tested Expression Expression Primary Mode of Expression: Nonverbal - gestures Verbal Expression Overall Verbal Expression: Impaired Initiation: Impaired Automatic Speech: Name;Counting;Social Response;Day of week Level of Generative/Spontaneous Verbalization: Word Repetition: Impaired Level of Impairment: Word level Naming: Impairment Responsive: 0-25% accurate Verbal Errors: Perseveration;Jargon Pragmatics: Unable to assess Effective Techniques:  (gestures) Non-Verbal Means of Communication: Gestures;Writing;Communication board Written Expression Written Expression: Not tested Oral Motor Oral Motor/Sensory Function Overall Oral Motor/Sensory Function: Moderate impairment Facial ROM: Reduced right Facial Symmetry: Abnormal symmetry right Facial Strength: Reduced right Facial Sensation: Reduced right Lingual Strength: Reduced;Suspected CN XII (hypoglossal) dysfunction Velum: Within Functional Limits Mandible: Within Functional Limits Motor Speech Overall Motor Speech: Impaired Respiration: Within functional limits Phonation: Normal Resonance: Within functional limits Articulation: Impaired Intelligibility: Intelligibility reduced Word: 25-49% accurate Motor Planning: Impaired Level of Impairment:  Word Motor Speech Errors: Consistent Effective Techniques: Slow rate  Care Tool Care Tool Cognition Ability to hear (with hearing aid or hearing appliances if normally used Ability to hear (with hearing aid or hearing appliances if normally used): 0. Adequate - no difficulty in normal conservation, social interaction, listening to TV   Expression of Ideas and Wants Expression of Ideas and Wants: 1. Rarely/Never expressess or very difficult - rarely/never expresses self or speech is very difficult to understand   Understanding Verbal and Non-Verbal Content Understanding Verbal and Non-Verbal Content: 2. Sometimes understands - understands only basic conversations or simple, direct phrases. Frequently  requires cues to understand  Memory/Recall Ability Memory/Recall Ability : That he or she is in a hospital/hospital unit;Current season (based on Y/N questions)   PMSV Assessment  PMSV Trial Intelligibility: Intelligibility reduced Word: 25-49% accurate  Bedside Swallowing Assessment General Date of Onset: 06/14/23 Previous Swallow Assessment: most recent MBS completed on 06/23/23 Diet Prior to this Study: Dysphagia 1 (pureed);Moderately thick liquids (Level 3, honey thick) Temperature Spikes Noted: No Respiratory Status: Room air History of Recent Intubation: No Behavior/Cognition: Alert;Pleasant mood;Cooperative Oral Cavity - Dentition: Adequate natural dentition Self-Feeding Abilities: Able to feed self;Needs assist;Needs set up Vision: Functional for self-feeding Patient Positioning: Upright in bed Baseline Vocal Quality: Normal Volitional Swallow: Unable to elicit  Oral Care Assessment Oral Assessment  (WDL): Exceptions to WDL Lips: Asymmetrical Teeth: Missing (Comment) Tongue: Pink;Moist Mucous Membrane(s): Moist;Pink Saliva: Moist, saliva free flowing Level of Consciousness: Alert Is patient on any of following O2 devices?: None of the above Nutritional status: On thickened  liquids Oral Assessment Risk : High Risk Ice Chips Ice chips: Impaired Oral Phase Impairments: Reduced labial seal Oral Phase Functional Implications: Right anterior spillage Thin Liquid Thin Liquid: Impaired Presentation: Cup;Spoon Oral Phase Impairments: Reduced labial seal Oral Phase Functional Implications: Right anterior spillage Pharyngeal  Phase Impairments: Cough - Delayed Other Comments: x1 delayed cough following thins by cup Nectar Thick Nectar Thick Liquid: Not tested Honey Thick Honey Thick Liquid: Impaired Presentation: Cup;Straw Oral Phase Impairments: Reduced labial seal Oral Phase Functional Implications: Right anterior spillage Puree Puree: Impaired Oral Phase Impairments: Reduced labial seal Oral Phase Functional Implications: Right anterior spillage Solid Solid: Impaired Oral Phase Impairments: Reduced lingual movement/coordination;Reduced labial seal Oral Phase Functional Implications: Oral residue (cleared with honey-thick liquid wash) BSE Assessment Risk for Aspiration Impact on safety and function: Mild aspiration risk  Short Term Goals: Week 1: SLP Short Term Goal 1 (Week 1): pt will participate in repeatt MBSS to determine diet advancement as indicated. SLP Short Term Goal 2 (Week 1): Pt will tolerte Dys 2 trials with no overt or subtle s/s of aspiration. SLP Short Term Goal 3 (Week 1): Pt will utilize compensatory swallow strategies to reduced or clear oral residue as needed with min assistance. SLP Short Term Goal 4 (Week 1): Pt will repeat functional words with >50% intelligiblity given direct verbal model. SLP Short Term Goal 5 (Week 1): Pt will follow 1-step commands with >75% accuracy given min cues. SLP Short Term Goal 6 (Week 1): Pt will answer basic Y/N questions with >90% accuracy given min cues.  Refer to Care Plan for Long Term Goals  Recommendations for other services: None   Discharge Criteria: Patient will be discharged from SLP if  patient refuses treatment 3 consecutive times without medical reason, if treatment goals not met, if there is a change in medical status, if patient makes no progress towards goals or if patient is discharged from hospital.  The above assessment, treatment plan, treatment alternatives and goals were discussed and mutually agreed upon: by patient and by family  Ellery Plunk 06/28/2023, 12:13 PM

## 2023-06-28 NOTE — Evaluation (Signed)
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  Last known well 2 days prior to admission and reports of being on the floor 2 days ago with elevated BS then seeming tired and and lying around.  UDS positive for THC and he was out of window for TNK.  CT head showed moderate size subacute infarct in left basal ganglia with probable Petechial Hemorrhage and mild regional mass effect.  MRI/MRA brain w/o contrast showed acute to subacute perforater infarct at left basal ganglia with underlying severe stenosis v/s segmental occlusion of left M1 segment and Left ICA noted to be smaller than right and neck arterial imaging recommended to rule out proximal stenosis with underfilling.   Carotid dopplers done which was negative for significant ICA stenosis. Pulses bisferiens noted throughout carotid system  with question of AV disease. TEE performed 06/21 revealing EF 55-60% with normal LVF and moderate concentric LVH, tricuspid AV and negative for thrombus or IA shunt.    He has had issues with decreased energy level, emesis as well as poor p.o. intake.  Dr. Melynda Ripple felt stroke was likely due to right M1 occlusion and recommended DAPT x 3 months.  CT abdomen/pelvis/chest done to rule out occult  malignancy. This revealed age advanced three vessel coronary artery calcifications as well as atherosclerotic calcifications involving aorta and iliac arteries, predominant RUL tree in bud pattern s/o chronic atypical infection such as MAC and sludge in GB. HIV--NR. TSH-0.766 Hypercoagulopathy panel negative.    He had worsening of swallow function, left gaze preference and global aphasia with inability to follow commands and was made n.p.o. on 06/23. Repeat MRI brain 06/24 revealing interval increase in size of left MCA infarct including new/interval infarct left frontal lobe and new/interval small acute infarcts in right posterior limb internal capsule and similar petechial hemorrhage.  MBS performed 06/24 showing moderate to severe oral phase dysphagia with mild to moderate pharyngeal phase dysphagia with 1 episode of silent aspiration of nectar liquids and consistent penetration with thins as well as significant cognitive communicative deficits affecting safety and efficient p.o. consumption.  He was made n.p.o and transferred to [facility] on 06/25 for further workup and management. BP goal 140-180 recommended to prevent hypoperfusion due to severe M1 disease and before angiogram.   CTA head/neck done for workup of embolic stroke and showed evolving infarcts in left basal ganglia, left frontal and temporal lobes, and right posterior limb of internal capsule with mass  effect on left lateral ventricle and 3 mm left-to-right midline shift.  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  Plans were for left MCA stenting today however due to significant neurological deficits, procedure was postponed pending reasonable improvement after CIR.   He has had issues with coughing due to difficulty managing secretions and core track was placed for nutritional support.  MBS was repeated on 06/28 and patient started on D1 diet with honey liquids with aspiration precautions as well as recommendations with self-feeding.   His mentation has been improving with increase in verbal output and ability to produce automatic sequences with cues as well as ability to follow some commands.  Blood sugars noted to be poorly controlled due to tube feeds.  He was started on ergocalciferol due to vitamin D deficiency.  KUB done today due to reports of constipation and was negative for significant stool burden. On am prior to admission, he was found by nurse to have copious amount of vomitus with cortak in his mouth therefore core track was DC'd. He has been refusing pureed foods but tolerating thickened liquids per ST notes.   He continues to be limited by dense right hemiplegia requiring right knee blocked at all times your transfers, has right foot lean on standing and is unable to turn head to the left, question of emerging spasticity or tone in RLE, left inattention as well as expressive/receptive aphasia. He was independent PTA and CIR recommended due to functional decline. Family to provide 24/7 assistance after discharge. Patient transferred to CIR on 06/27/2023 .   Patient currently requires total assist with mobility secondary to muscle weakness,  decreased cardiorespiratoy endurance, impaired timing and sequencing, abnormal tone, unbalanced muscle activation, decreased coordination, and decreased motor planning, potential visual motor deficit with some field cut, decreased midline orientation, decreased attention to right, right side neglect, and decreased motor planning, decreased initiation, decreased attention, decreased awareness, decreased problem solving, decreased safety awareness, and delayed processing, and decreased sitting balance, decreased standing balance, decreased postural control, hemiplegia, and decreased balance strategies.  Prior to hospitalization, patient was independent  with mobility and lived with Family (Sister, brother-in-law, niece) in a House home.  Home access is  Level entry, Ramped entrance.  Patient will benefit from skilled PT intervention to maximize safe functional mobility, minimize fall risk, and decrease caregiver burden for planned discharge home with 24 hour assist.  Anticipate patient will benefit from follow up [facility] at discharge.  PT - End of Session Activity Tolerance: Tolerates 30+ min activity with multiple rests PT Assessment Rehab Potential (ACUTE/IP ONLY): Fair PT Barriers to Discharge: Decreased caregiver support;Home environment access/layout;Incontinence;Insurance for SNF coverage;Medication compliance;Nutrition means PT Patient demonstrates impairments in the following area(s): Balance;Endurance;Motor;Nutrition;Perception;Safety;Sensory PT Transfers Functional Problem(s): Bed Mobility;Bed to Chair;Car;Furniture PT Locomotion Functional Problem(s): Ambulation;Wheelchair Mobility;Stairs PT Plan PT Intensity: Minimum of 1-2 x/day ,45 to 90 minutes PT Frequency: 5 out of 7 days PT Duration Estimated Length of Stay: >3 weeks PT Treatment/Interventions: Ambulation/gait training;Community reintegration;DME/adaptive equipment instruction;Neuromuscular re-education;Psychosocial support;Stair  training;UE/LE Strength taining/ROM;Wheelchair propulsion/positioning;Balance/vestibular training;Discharge planning;Functional electrical stimulation;Pain management;Skin care/wound management;Therapeutic Activities;UE/LE Coordination activities;Cognitive remediation/compensation;Disease management/prevention;Functional mobility training;Patient/family education;Splinting/orthotics;Therapeutic Exercise;Visual/perceptual  remediation/compensation PT Transfers Anticipated Outcome(s): MinA/ CGA PT Locomotion Anticipated Outcome(s): MinA PT Recommendation Recommendations for Other Services: Therapeutic Recreation consult Therapeutic Recreation Interventions: Stress management Follow Up Recommendations: Home health PT;Outpatient PT;24 hour supervision/assistance Patient destination: Home Equipment Recommended: To be determined   PT Evaluation Precautions/Restrictions Precautions Precautions: Fall Precaution Comments: dense R hemi with flexor tone, expressive>receptive aphasia, honey thick liquids only Restrictions Weight Bearing Restrictions: No General   Vital SignsTherapy Vitals Temp: 97.6 F (36.4 C) Temp Source: Oral Pulse Rate: 85 Resp: 19 BP: (!) 152/78 Patient Position (if appropriate): Lying Oxygen Therapy SpO2: 99 % O2 Device: Room Air Pain Pain Assessment Pain Scale: 0-10 Pain Score: 0-No pain Pain Interference Pain Interference Pain Effect on Sleep: 1. Rarely or not at all Pain Interference with Therapy Activities: 1. Rarely or not at all Pain Interference with Day-to-Day Activities: 1. Rarely or not at all;2. Occasionally Home Living/Prior Functioning Home Living Living Arrangements: Other relatives Available Help at Discharge: Family (sister's stepdad can provide care while she is working and if niece is not available) Home Access: Level entry;Ramped entrance Home Layout: One level Bathroom Shower/Tub: Armed forces operational officer  Accessibility: Yes  Lives With: Family (Sister, brother-in-law, niece) Prior Function Level of Independence: Independent with basic ADLs;Independent with gait;Independent with homemaking with ambulation;Independent with transfers  Able to Take Stairs?: Yes Driving: No Vocation: On disability Vocation Requirements: no driving in last few months due to retinopathy, on disability due to neuropathy and type 1 diabetes (diagnosed at 42 yo) Vision/Perception  Vision - History Ability to See in Adequate Light: 1 Impaired Vision - Assessment Eye Alignment: Within Functional Limits Tracking/Visual Pursuits: Able to track stimulus in all quads without difficulty Additional Comments: unable to follow instructions 100% d/t aphasia Perception Perception: Impaired Inattention/Neglect: Does not attend to right visual field;Does not attend to right side of body Praxis Praxis: Impaired Praxis Impairment Details: Initiation;Motor planning;Ideomotor  Cognition Overall Cognitive Status: Difficult to assess Arousal/Alertness: Awake/alert Orientation Level: Oriented to person Attention: Focused Focused Attention: Impaired Awareness: Impaired Problem Solving: Impaired Safety/Judgment: Impaired Sensation Sensation Light Touch: Impaired by gross assessment Additional Comments: assessment limited d/t aphasia but does relate no sesnation in most of Bil feet, responds to sensation in lower legs with head nods Coordination Gross Motor Movements are Fluid and Coordinated: No Fine Motor Movements are Fluid and Coordinated: No Coordination and Movement Description: L hemibody appears WFL. R hemibody non functional due to hemiplegia and moderate hypertone in limbs Finger Nose Finger Test: pt is not consistent with yes/no responses or ability to follow directions for LUE, he is unable to move RUE Heel Shin Test: unable with RLE, does not understand verbal instructions for LLE but does initiate following visual  demonstration. Motor  Motor Motor: Hemiplegia;Abnormal tone;Abnormal postural alignment and control Motor - Skilled Clinical Observations: increased flexor tone in RUE/ RLE, no volitional movement R hemibody, signifiant R lean without LUE support   Trunk/Postural Assessment  Cervical Assessment Cervical Assessment: Exceptions to [facility] (reduced lateral flexion or rotation to R) Thoracic Assessment Thoracic Assessment: Exceptions to [facility] (rounded shoulders R>L) Lumbar Assessment Lumbar Assessment: Exceptions to [facility] (neutral tilt in stance, minimal posterior pelvic tilt when seated) Postural Control Postural Control: Deficits on evaluation Trunk Control: significant lean to R Righting Reactions: unable to initiate corrective lean to L without vc/ tc Protective Responses: decreased  Balance Balance Balance Assessed: Yes Static Sitting Balance Static Sitting - Level of Assistance: 2: Max assist Static Sitting - Comment/# of Minutes: Significant  R lean with inability to correct prior to education on pull with LUE, unable to initiate without cues to correct, then up to MaxA Dynamic Sitting Balance Dynamic Sitting - Level of Assistance: 1: +1 Total assist Static Standing Balance Static Standing - Level of Assistance: 1: +1 Total assist Dynamic Standing Balance Dynamic Standing - Level of Assistance: 1: +1 Total assist Extremity Assessment      RLE Assessment RLE Assessment: Exceptions to [facility] General Strength Comments: grossly 0/5 with MMT, during ambulation/ stance noted functional glute and quad activation at 1 to 2-/ 5 LLE Assessment LLE Assessment: Within Functional Limits  Care Tool Care Tool Bed Mobility Roll left and right activity   Roll left and right assist level: Moderate Assistance - Patient 50 - 74%    Sit to lying activity   Sit to lying assist level: Maximal Assistance - Patient 25 - 49%    Lying to sitting on side of bed activity   Lying to sitting on side of bed  assist level: the ability to move from lying on the back to sitting on the side of the bed with no back support.: 2 Helpers     Care Tool Transfers Sit to stand transfer   Sit to stand assist level: Total Assistance - Patient < 25%    Chair/bed transfer   Chair/bed transfer assist level: Dependent - mechanical lift (STEDY)     Toilet transfer Toilet transfer activity did not occur: Safety/medical concerns      Licensed conveyancer transfer activity did not occur: Safety/medical concerns        Care Tool Locomotion Ambulation   Assist level: Maximal Assistance - Patient 25 - 49% Assistive device: Other (comment) (L hallway handrail) Max distance: 35 ft  Walk 10 feet activity   Assist level: Maximal Assistance - Patient 25 - 49% Assistive device: Other (comment) (L hallway hand rail)   Walk 50 feet with 2 turns activity Walk 50 feet with 2 turns activity did not occur: Safety/medical concerns      Walk 150 feet activity Walk 150 feet activity did not occur: Safety/medical concerns      Walk 10 feet on uneven surfaces activity Walk 10 feet on uneven surfaces activity did not occur: Safety/medical concerns      Stairs Stair activity did not occur: Safety/medical concerns        Walk up/down 1 step activity Walk up/down 1 step or curb (drop down) activity did not occur: Safety/medical concerns      Walk up/down 4 steps activity Walk up/down 4 steps activity did not occur: Safety/medical concerns      Walk up/down 12 steps activity Walk up/down 12 steps activity did not occur: Safety/medical concerns      Pick up small objects from floor   Pick up small object from the floor assist level: Dependent - Patient 0%    Wheelchair Is the patient using a wheelchair?: Yes Type of Wheelchair: Manual   Wheelchair assist level: Dependent - Patient 0% Max wheelchair distance: 150 ft  Wheel 50 feet with 2 turns activity   Assist Level: Dependent - Patient 0%  Wheel 150 feet  activity   Assist Level: Dependent - Patient 0%    Refer to Care Plan for Long Term Goals  SHORT TERM GOAL WEEK 1 PT Short Term Goal 1 (Week 1): Pt will perform bed mobility at overall ModA. PT Short Term Goal 2 (Week 1): Pt will perform sit<>stand with overall maxA +1 PT  Short Term Goal 3 (Week 1): pt will maintain sitting balance to midline with modA +1 for at least . PT Short Term Goal 4 (Week 1): pt will perform squat pivot transfer with MaxA +1 PT Short Term Goal 5 (Week 1): Pt will complete PASS for outcome measure.  Recommendations for other services: Therapeutic Recreation  Stress management and Outing/community reintegration  Skilled Therapeutic Intervention Mobility Bed Mobility Bed Mobility: Sit to Supine Sit to Supine: Maximal Assistance - Patient 25-49% Transfers Transfers: Sit to Stand;Stand to Sit;Transfer via Lift Equipment Sit to Stand: Maximal Assistance - Patient 25-49%;Total Assistance - Patient < 25% Stand to Sit: Maximal Assistance - Patient 25-49% Squat Pivot Transfers: 2 Helpers;Total Assistance - Patient < 25% Transfer (Assistive device): None Transfer via Lift Equipment: Youth worker Ambulation: Yes Gait Assistance: Maximal Assistance - Patient 25-49% Gait Distance (Feet): 35 Feet Assistive device: Other (Comment) (L hallway HR) Gait Assistance Details: Tactile cues for sequencing;Tactile cues for weight shifting;Tactile cues for weight beaing;Verbal cues for technique;Verbal cues for gait pattern;Manual facilitation for weight shifting;Manual facilitation for placement Gait Gait: Yes Gait Pattern: Impaired Gait Pattern:  (LLE with increased flexor tone and positioned in ER in stance, R knee block to prevent buckling) Gait velocity: decreased Stairs / Additional Locomotion Stairs: No Wheelchair Mobility Wheelchair Mobility: No  Skilled Intervention: PT Evaluation completed; see above for results. PT educated patient in roles of PT  vs OT, PT POC, rehab potential, rehab goals, and potential discharge recommendations along with recommendation for follow-up rehabilitation services. Individual treatment initiated:  Patient seated upright in w/c upon PT arrival. Patient alert and agreeable to PT session. No pain complaint during session.  Therapeutic Activity: Transfers: Patient performed sit <> stand while holding on to footboard of bed with LUE. Required Max/ TotA for power up and MaxA to complete to upright stance. Requires block to R knee d/t buckle with no quad activation. Significant heavy lean to R with inability to self correct but does maintain stance with LLE and LUE support. Forward lean requiring vc/ tc and cue for scap set for improved trunk extension. STEDY used for seat to seat transfer and pt requires Max/TotA for power up from w/c height. Is able to rise to stand from perch seat with CGA/ MinA for R lean. Demos impulsivity with rise to stand and some inability to follow verbal instructions for returning to sit. Then requires MaxA to control descent to w/c seat.   Gait Training:  Patient ambulated 55' x1 using L hallway hand rail with Max/ TotA. Ambulated with need for RLE positioning and advancement as pt is unable to initiate swing phase and then requires MaxA to prevent knee buckling during stance phase. Requires support from therapist under R shoulder to maintain upright posture throughout. Demos increased hip ER with significant toe out. Improves slightly with minimal activation to R quad and glute during stance phase but still requiring block to R knee. +2 available for w/c follow throughout. Provided vc/tc for sequencing step pattern, min facilitation for lateral weight shift, and cues to stand tall for improved posture throughout.  Neuromuscular Re-ed: NMR facilitated during session with focus on sitting/ standing balance, midline orientation. Pt guided in stance using STEDY but demos continued difficulty with  motor plan and execution from low seat requiring consistent vc/ tc for forward lean, BLE extensor push and requires MaxA +1 throughout in STEDY. Demos impulsivity with multiple rises to stand while seated on STEDY perch seat. Mirror used for Financial trader  to improve bodily proprioception and demos intermittent use of LUE to pull in order to return to midline with R lean. Requires up to Max/TotA at times to maintain upright posture.   In sitting, pt also guided in forward reach with LUE to targets just at edge of BOS in order to challenge trunk control and return to upright seated position. Pt demos significant lean to R and requires pull with LUE to maintain balance otherwise needing Max/ TotA to control balance. Without hold to LUE, pt demos lean and LOB to R > ant/ post LOB.   NMR performed for improvements in motor control and coordination, balance, sequencing, judgement, and self confidence/ efficacy in performing all aspects of mobility at highest level of independence.   Bed Mobility: Patient performed sit--> supine with MaxA especially for RLE to bed surface. Pt able to initiate with vc to return to bed. Provided verbal cues for effort and use of LLE with pelvis to assist RLE into bed. Pt positioned in hooklying as he is unable to initiate with either BLE and refuses to attempt bridge for improved positioning in bed.   Patient supine in bed at end of session with brakes locked, bed alarm set, and all needs within reach on tray table to pt's L side.    Discharge Criteria: Patient will be discharged from PT if patient refuses treatment 3 consecutive times without medical reason, if treatment goals not met, if there is a change in medical status, if patient makes no progress towards goals or if patient is discharged from hospital.  The above assessment, treatment plan, treatment alternatives and goals were discussed and mutually agreed upon: by patient  Loel Dubonnet PT, DPT, CSRS 06/28/2023,  5:17 PM

## 2023-06-28 NOTE — Plan of Care (Signed)
  Problem: RH Balance Goal: LTG Patient will maintain dynamic sitting balance (PT) Description: LTG:  Patient will maintain dynamic sitting balance with assistance during mobility activities (PT) Flowsheets (Taken 06/28/2023 1725) LTG: Pt will maintain dynamic sitting balance during mobility activities with:: Supervision/Verbal cueing Goal: LTG Patient will maintain dynamic standing balance (PT) Description: LTG:  Patient will maintain dynamic standing balance with assistance during mobility activities (PT) Flowsheets (Taken 06/28/2023 1725) LTG: Pt will maintain dynamic standing balance during mobility activities with:: Minimal Assistance - Patient > 75%   Problem: Sit to Stand Goal: LTG:  Patient will perform sit to stand with assistance level (PT) Description: LTG:  Patient will perform sit to stand with assistance level (PT) Flowsheets (Taken 06/28/2023 1725) LTG: PT will perform sit to stand in preparation for functional mobility with assistance level: Contact Guard/Touching assist   Problem: RH Bed Mobility Goal: LTG Patient will perform bed mobility with assist (PT) Description: LTG: Patient will perform bed mobility with assistance, with/without cues (PT). Flowsheets (Taken 06/28/2023 1725) LTG: Pt will perform bed mobility with assistance level of: Contact Guard/Touching assist   Problem: RH Bed to Chair Transfers Goal: LTG Patient will perform bed/chair transfers w/assist (PT) Description: LTG: Patient will perform bed to chair transfers with assistance (PT). Flowsheets (Taken 06/28/2023 1725) LTG: Pt will perform Bed to Chair Transfers with assistance level: Minimal Assistance - Patient > 75%   Problem: RH Car Transfers Goal: LTG Patient will perform car transfers with assist (PT) Description: LTG: Patient will perform car transfers with assistance (PT). Flowsheets (Taken 06/28/2023 1725) LTG: Pt will perform car transfers with assist:: Minimal Assistance - Patient > 75%   Problem:  RH Furniture Transfers Goal: LTG Patient will perform furniture transfers w/assist (OT/PT) Description: LTG: Patient will perform furniture transfers  with assistance (OT/PT). Flowsheets (Taken 06/28/2023 1725) LTG: Pt will perform furniture transfers with assist:: Minimal Assistance - Patient > 75%   Problem: RH Ambulation Goal: LTG Patient will ambulate in controlled environment (PT) Description: LTG: Patient will ambulate in a controlled environment, # of feet with assistance (PT). Flowsheets (Taken 06/28/2023 1725) LTG: Pt will ambulate in controlled environ  assist needed:: Moderate Assistance - Patient 50 - 74% LTG: Ambulation distance in controlled environment: 150 ft using LRAD Goal: LTG Patient will ambulate in home environment (PT) Description: LTG: Patient will ambulate in home environment, # of feet with assistance (PT). Flowsheets (Taken 06/28/2023 1725) LTG: Pt will ambulate in home environ  assist needed:: Moderate Assistance - Patient 50 - 74% LTG: Ambulation distance in home environment: up to 50 ft with no LRAD   Problem: RH Wheelchair Mobility Goal: LTG Patient will propel w/c in controlled environment (PT) Description: LTG: Patient will propel wheelchair in controlled environment, # of feet with assist (PT) Flowsheets (Taken 06/28/2023 1725) LTG: Pt will propel w/c in controlled environ  assist needed:: Supervision/Verbal cueing LTG: Propel w/c distance in controlled environment: >150 ft Goal: LTG Patient will propel w/c in home environment (PT) Description: LTG: Patient will propel wheelchair in home environment, # of feet with assistance (PT). Flowsheets (Taken 06/28/2023 1725) LTG: Pt will propel w/c in home environ  assist needed:: Supervision/Verbal cueing Distance: wheelchair distance in controlled environment: 150 LTG: Propel w/c distance in home environment: up to 50 ft

## 2023-06-28 NOTE — Progress Notes (Signed)
Occupational Therapy Assessment and Plan  Patient Details  Name: Zachary Winters MRN: [id number] Date of Birth: Oct 21, 1981  OT Diagnosis: abnormal posture, apraxia, cognitive deficits, disturbance of vision, and hemiplegia affecting dominant side Rehab Potential: Rehab Potential (ACUTE ONLY): Good ELOS: 28-30 days   Today's Date: 06/28/2023 OT Individual Time: 1030-1140 OT Individual Time Calculation (min): 70 min     Hospital Problem: Principal Problem:   Acute ischemic left middle cerebral artery (MCA) stroke (HCC)   Past Medical History:  Past Medical History:  Diagnosis Date   Diabetes mellitus without complication (HCC)    Past Surgical History:  Past Surgical History:  Procedure Laterality Date   TEE WITHOUT CARDIOVERSION N/A 06/16/2023   Procedure: TRANSESOPHAGEAL ECHOCARDIOGRAM (TEE);  Surgeon: Jonelle Sidle, MD;  Location: [facility];  Service: Cardiovascular;  Laterality: N/A;    Assessment & Plan Clinical Impression: . 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  Last known well 2 days prior to admission and reports of being on the floor 2 days ago with elevated BS then seeming tired and and lying around.  UDS positive for THC and he was out of window for TNK.  CT head showed moderate size subacute infarct in left basal ganglia with probable Petechial Hemorrhage and mild regional mass effect.  MRI/MRA brain w/o contrast showed acute to subacute perforater infarct at left basal ganglia with underlying severe stenosis v/s segmental occlusion of left M1 segment and Left ICA noted to be smaller than right and neck arterial imaging recommended to rule out proximal stenosis with underfilling.   Carotid dopplers done which was negative for significant  ICA stenosis. Pulses bisferiens noted throughout carotid system with question of AV disease. TEE performed 06/21 revealing EF 55-60% with normal LVF and moderate concentric LVH, tricuspid AV and negative for thrombus or IA shunt.    He has had issues with decreased energy level, emesis as well as poor p.o. intake.  Dr. Melynda Ripple felt stroke was likely due to right M1 occlusion and recommended DAPT x 3 months.  CT abdomen/pelvis/chest done to rule out occult  malignancy. This revealed age advanced three vessel coronary artery calcifications as well as atherosclerotic calcifications involving aorta and iliac arteries, predominant RUL tree in bud pattern s/o chronic atypical infection such as MAC and sludge in GB. HIV--NR. TSH-0.766 Hypercoagulopathy panel negative.    He had worsening of swallow function, left gaze preference and global aphasia with inability to follow commands and was made n.p.o. on 06/23. Repeat MRI brain 06/24 revealing interval increase in size of left MCA infarct including new/interval infarct left frontal lobe and new/interval small acute infarcts in right posterior limb internal capsule and similar petechial hemorrhage.  MBS performed 06/24 showing moderate to severe oral phase dysphagia with mild to moderate pharyngeal phase dysphagia with 1 episode of silent aspiration of nectar liquids and consistent penetration with thins as well as significant cognitive communicative deficits affecting safety and efficient p.o. consumption.  He was made n.p.o and transferred to [facility] on 06/25 for further workup and management. BP goal 140-180 recommended to prevent hypoperfusion due to severe M1 disease and before angiogram.   CTA head/neck done for workup of embolic stroke and showed evolving infarcts in left basal ganglia, left frontal  and temporal lobes, and right posterior limb of internal capsule with mass effect on left lateral ventricle and 3 mm left-to-right midline shift.  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  Plans were for left MCA stenting today however due to significant neurological deficits, procedure was postponed pending reasonable improvement after CIR.   He has had issues with coughing due to difficulty managing secretions and core track was placed for nutritional support.  MBS was repeated on 06/28 and patient started on D1 diet with honey liquids with aspiration precautions as well as recommendations with self-feeding.   His mentation has been improving with increase in verbal output and ability to produce automatic sequences with cues as well as ability to follow some commands.  Blood sugars noted to be poorly controlled due to tube feeds.  He was started on ergocalciferol due to vitamin D deficiency.  KUB done today due to reports of constipation and was negative for significant stool burden. On am prior to admission, he was found by nurse to have copious amount of vomitus with cortak in his mouth therefore core track was DC'd. He has been refusing pureed foods but tolerating thickened liquids per ST notes.   He continues to be limited by dense right hemiplegia requiring right knee blocked at all times your transfers, has right foot lean on standing and is unable to turn head to the left, question of emerging spasticity or tone in RLE, left inattention as well as expressive/receptive aphasia. He was independent PTA and CIR recommended due to functional decline. Family to provide 24/7 assistance after discharge.      By nodding head yes and no- pt denies pain; denies  constipation, but not sure of LBM.  Doesn't know that pulled out Cortrak by vomiting, however coughed/sneezed so hard, was projectile while I was in room.   Patient transferred to CIR on 06/27/2023 .    Patient currently requires total with basic self-care skills secondary to abnormal tone and motor apraxia, decreased visual acuity, decreased attention to right, decreased attention, decreased safety awareness, and delayed processing, and decreased sitting balance, decreased standing balance, decreased postural control, and hemiplegia.  Prior to hospitalization, patient was independent with BADLs and IADLs.  Patient will benefit from skilled intervention to increase independence with basic self-care skills prior to discharge home with care partner.  Anticipate patient will require 24 hour supervision and minimal physical assistance and follow up home health.  OT - End of Session Activity Tolerance: Decreased this session OT Assessment Rehab Potential (ACUTE ONLY): Good OT Barriers to Discharge: Incontinence OT Patient demonstrates impairments in the following area(s): Balance;Cognition;Endurance;Motor;Perception;Safety;Sensory;Vision OT Basic ADL's Functional Problem(s): Eating;Grooming;Bathing;Dressing;Toileting OT Transfers Functional Problem(s): Toilet;Tub/Shower OT Additional Impairment(s): Fuctional Use of Upper Extremity OT Plan OT Intensity: Minimum of 1-2 x/day, 45 to 90 minutes OT Frequency: 5 out of 7 days OT Duration/Estimated Length of Stay: 28-30 days OT Treatment/Interventions: Balance/vestibular training;Cognitive remediation/compensation;DME/adaptive equipment instruction;Discharge planning;Functional mobility training;Neuromuscular re-education;Psychosocial support;Patient/family education;Pain management;Self Care/advanced ADL retraining;Splinting/orthotics;UE/LE Strength taining/ROM;Therapeutic Exercise;Therapeutic Activities;UE/LE Coordination activities;Visual/perceptual  remediation/compensation;Wheelchair propulsion/positioning OT Self Feeding Anticipated Outcome(s): supervision OT Basic Self-Care Anticipated Outcome(s): min A OT Toileting Anticipated  Outcome(s): mod A OT Bathroom Transfers Anticipated Outcome(s): min A to toilet, mod A to tub OT Recommendation Patient destination: Home Follow Up Recommendations: Home health OT Equipment Recommended: Tub/shower bench;3 in 1 bedside comode   OT Evaluation Precautions/Restrictions  Precautions Precautions: Fall Restrictions Weight Bearing Restrictions: No  Pain Pain Assessment Pain Scale: 0-10 Pain Score: 0-No pain Home Living/Prior Functioning Home Living Type of Home: House Home Access: Level entry, Ramped entrance Home Layout: One level Bathroom Shower/Tub: Engineer, manufacturing systems: Standard  Lives With: Family Prior Function Level of Independence: Independent with basic ADLs, Independent with gait, Independent with homemaking with ambulation Driving: No Vocation: On disability Vocation Requirements: no driving in last few months due to retinopathy, on disability due to neuropathy and type 1 diabetes (diagnosed at 42 yo) Vision Baseline Vision/History: 5 Retinopathy (per report from family) Ability to See in Adequate Light: 1 Impaired Patient Visual Report: No change from baseline (pt is not consistent with yes/no responses so visual assessment limited) Eye Alignment: Within Functional Limits Additional Comments: pt is not consistent with yes/no responses or ability to follow directions, so  assessment limited Perception  Perception: Impaired Inattention/Neglect: Does not attend to right visual field;Does not attend to right side of body Praxis Praxis: Impaired Praxis Impairment Details: Motor planning;Initiation;Ideomotor Cognition Cognition Overall Cognitive Status: Difficult to assess (secondary to global aphasia) Arousal/Alertness: Awake/alert Orientation Level:  Person;Nonverbal/unable to assess Attention: Focused Focused Attention: Impaired Focused Attention Impairment: Functional basic Brief Interview for Mental Status (BIMS) Repetition of Three Words (First Attempt): Nonsensical (pt did attempt to repeat words but unable to understand pt.) Temporal Orientation: Year: Nonsensical Temporal Orientation: Month: Nonsensical Temporal Orientation: Day: Nonsensical Recall: "Sock": No answer Recall: "Blue": No answer Recall: "Bed": No answer BIMS Summary Score: 99 Sensation Sensation Additional Comments: pt is not consistent with yes/no responses or ability to follow directions, so assessment limited Coordination Gross Motor Movements are Fluid and Coordinated: No Fine Motor Movements are Fluid and Coordinated: No Coordination and Movement Description: LUE appears WFL. RUE/RLE non functional due to hemiplegia and moderate hypertone Finger Nose Finger Test: pt is not consistent with yes/no responses or ability to follow directions for LUE, he is unable to move RUE Motor  Motor Motor: Hemiplegia;Abnormal tone;Abnormal postural alignment and control  Trunk/Postural Assessment  Postural Control Postural Control: Deficits on evaluation (severe R lean in sitting)  Balance Static Sitting Balance Static Sitting - Level of Assistance: 2: Max assist Static Sitting - Comment/# of Minutes: severe R lean, pt with poor motor planning with using L hand to stabilize himself and demonstrating limited attention span.  Once his hand was placed into position, he kept moving it out of position Dynamic Sitting Balance Dynamic Sitting - Level of Assistance: 1: +2 Total assist Static Standing Balance Static Standing - Level of Assistance: Not tested (comment) Dynamic Standing Balance Dynamic Standing - Level of Assistance: Not tested (comment) Extremity/Trunk Assessment RUE Assessment RUE Assessment: Exceptions to [facility] Active Range of Motion (AROM) Comments: 0  AROM RUE Body System: Neuro Brunstrum levels for arm and hand: Arm;Hand Brunstrum level for arm: Stage I Presynergy Brunstrum level for hand: Stage I Flaccidity RUE Tone RUE Tone: Moderate;Modified Ashworth Body Part - Modified Ashworth Scale: Elbow Elbow - Modified Ashworth Scale for Grading Hypertonia RUE: More marked increase in muscle tone through most of the ROM, but affected part(s) easily moved Modified Ashworth Scale for Grading Hypertonia RUE: More marked increase in muscle tone through most of the ROM, but affected part(s) easily  moved LUE Assessment LUE Assessment: Within Functional Limits  Care Tool Care Tool Self Care Eating   Eating Assist Level: Dependent - Patient 0%    Oral Care    Oral Care Assist Level: Dependent - Patient 0%)    Bathing     Body parts bathed by helper: Right arm;Left arm;Chest;Abdomen;Front perineal area;Buttocks;Right upper leg;Left upper leg;Face;Left lower leg;Right lower leg   Assist Level: Dependent - Patient 0%    Upper Body Dressing(including orthotics)   What is the patient wearing?: Pull over shirt   Assist Level: Total Assistance - Patient < 25%    Lower Body Dressing (excluding footwear)   What is the patient wearing?: Underwear/pull up;Incontinence brief Assist for lower body dressing: Dependent - Patient 0%    Putting on/Taking off footwear   What is the patient wearing?: Non-skid slipper socks Assist for footwear: Dependent - Patient 0%       Care Tool Toileting Toileting activity   Assist for toileting: Dependent - Patient 0%     Care Tool Bed Mobility Roll left and right activity   Roll left and right assist level: Moderate Assistance - Patient 50 - 74%    Sit to lying activity        Lying to sitting on side of bed activity   Lying to sitting on side of bed assist level: the ability to move from lying on the back to sitting on the side of the bed with no back support.: 2 Helpers     Care Tool Transfers Sit  to stand transfer   Sit to stand assist level: Dependent - Patient 0% (unable to get pt to rise to full stand)    Chair/bed transfer   Chair/bed transfer assist level: 2 Chief Strategy Officer transfer activity did not occur: Safety/medical concerns       Care Tool Cognition  Expression of Ideas and Wants Expression of Ideas and Wants: 1. Rarely/Never expressess or very difficult - rarely/never expresses self or speech is very difficult to understand  Understanding Verbal and Non-Verbal Content Understanding Verbal and Non-Verbal Content: 2. Sometimes understands - understands only basic conversations or simple, direct phrases. Frequently requires cues to understand   Memory/Recall Ability Memory/Recall Ability : That he or she is in a hospital/hospital unit;Current season (based on Y/N questions)   Refer to Care Plan for Long Term Goals  SHORT TERM GOAL WEEK 1 OT Short Term Goal 1 (Week 1): pt will be able to hold static sit balance with CGA to enable him to sit safely on a BSC. OT Short Term Goal 2 (Week 1): Pt will be able to sit to stand in a stedy lift with mod A to enable a safe transfer to a BSC. OT Short Term Goal 3 (Week 1): Pt will demonstrate improved R side attention by completing RUE self ROM to prevent contractures due to hypertone, OT Short Term Goal 4 (Week 1): Pt will sit to stand with max A to enable care givers to pull clothing up over hips.  Recommendations for other services: None    Skilled Therapeutic Intervention ADL ADL Eating: Dependent Grooming: Dependent Upper Body Bathing: Dependent Where Assessed-Upper Body Bathing: Edge of bed Lower Body Bathing: Dependent Where Assessed-Lower Body Bathing: Bed level Upper Body Dressing: Maximal assistance Where Assessed-Upper Body Dressing: Edge of bed Lower Body Dressing: Dependent Where Assessed-Lower Body Dressing: Bed level Toileting: Not assessed Toilet Transfer: Not assessed    Pt seen for  initial evaluation and ADL training with a focus on safe functional bed mobility and transfers.   Reviewed role of OT, discussed POC and pt's goals, and ELOS with pt who nodded yes or no intermittently and with his grandmother and cousin. Pt was incontinent of bladder and bowel with large amounts of bowel seeping out of brief while pt in bed. This took considerable time for cleansing and NT arrived to assist.  Donned brief and pants from bed total A. Rehab tech arrived to assist with mobility.  Pt needs significant assist and a stedy lift may be safer at this time.  Will recommend PT try that.  See ADL documentation above.  Pt resting in w/c With all needs met and belt alarm set.     Discharge Criteria: Patient will be discharged from OT if patient refuses treatment 3 consecutive times without medical reason, if treatment goals not met, if there is a change in medical status, if patient makes no progress towards goals or if patient is discharged from hospital.  The above assessment, treatment plan, treatment alternatives and goals were discussed and mutually agreed upon: by patient and by family  Trayonna Bachmeier 06/28/2023, 12:44 PM

## 2023-06-28 NOTE — Patient Care Conference (Signed)
Inpatient RehabilitationTeam Conference and Plan of Care Update Date: 06/28/2023   Time: 10:12 AM    Patient Name: Zachary Winters      Medical Record Number: [id number]  Date of Birth: 08-12-1981 Sex: Male         Room/Bed: 4W13C/4W13C-01 Payor Info: Payor: [employer] MEDICAID PREPAID HEALTH PLAN / Plan: [place] MEDICAID HEALTHY BLUE / Product Type: *No Product type* /    Admit Date/Time:  06/27/2023  4:33 PM  Primary Diagnosis:  Acute ischemic left middle cerebral artery (MCA) stroke [facility])  Hospital Problems: Principal Problem:   Acute ischemic left middle cerebral artery (MCA) stroke [facility])    Expected Discharge Date: Expected Discharge Date:  (Evals pending)  Team Members Present: Physician leading conference: Dr. Claudette Laws Social Worker Present: Lavera Guise, BSW Nurse Present: Chana Bode, RN PT Present: Ralph Leyden, PT OT Present: Primitivo Gauze, OT SLP Present: Other (comment) Fae Pippin, SLP) PPS Coordinator present : Fae Pippin, SLP     Current Status/Progress Goal Weekly Team Focus  Bowel/Bladder   Pt is incontinent of bowel/bladder   Pt will gain continence of bowel/bladder   Will assess qshift and PRN    Swallow/Nutrition/ Hydration   Eval Pending           ADL's   eval pending            Mobility   eval pending later today           Communication   Eval Pending            Safety/Cognition/ Behavioral Observations  Eval Pending            Pain   Pt will gesture no if you ask him if he has any pain   Pt will continue to deny pain   Will assess qshift and PRN    Skin   Pt's skin is intact   Pt's skin will remain intact  Will assess qshift and PRN      Discharge Planning:  New admission, assesment pending.   Team Discussion: Patient with right hemiparesis and aphasia post left MCA CVA. Poor appetite; megace added; calorie count post cortrak dislodged on D1 texture, honey thick liquid diet.   Patient on target to meet  rehab goals: Evals pending  *See Care Plan and progress notes for long and short-term goals.   Revisions to Treatment Plan:  N/a ? PEG if po intake inadequate   Teaching Needs: Safety, medications, dietary modifications, transfers, toileting, etc.   Current Barriers to Discharge: Decreased caregiver support and Home enviroment access/layout  Possible Resolutions to Barriers: Family education; SW to follow up on home situation and assistance available     Medical Summary Current Status: new pt, BPs uncontrolled HR elevated , severe weakness adn aphasia and dysphagia  Barriers to Discharge: Medical stability;Uncontrolled Diabetes;Inadequate Nutritional Intake   Possible Resolutions to [employer] Focus: may need PEG, DM management, HTN and HR management   Continued Need for Acute Rehabilitation Level of Care: The patient requires daily medical management by a physician with specialized training in physical medicine and rehabilitation for the following reasons: Direction of a multidisciplinary physical rehabilitation program to maximize functional independence : Yes Medical management of patient stability for increased activity during participation in an intensive rehabilitation regime.: Yes Analysis of laboratory values and/or radiology reports with any subsequent need for medication adjustment and/or medical intervention. : Yes   I attest that I was present, lead the team conference, and concur with  the assessment and plan of the team.   Pamelia Hoit 06/28/2023, 1:38 PM

## 2023-06-28 NOTE — Progress Notes (Signed)
Inpatient Rehabilitation  Patient information reviewed and entered into eRehab system by Brytney Somes M. Arnet Hofferber, M.A., CCC/SLP, PPS Coordinator.  Information including medical coding, functional ability and quality indicators will be reviewed and updated through discharge.    

## 2023-06-28 NOTE — Progress Notes (Signed)
Initial Nutrition Assessment  DOCUMENTATION CODES:   Not applicable  INTERVENTION:  - Continue Glucerna Shake po TID, each supplement provides 220 kcal and 10 grams of protein  - Start 48 hr Calorie Count.   NUTRITION DIAGNOSIS:   Inadequate oral intake related to lethargy/confusion, dysphagia as evidenced by meal completion < 25%.  GOAL:   Patient will meet greater than or equal to 90% of their needs  MONITOR:   PO intake  REASON FOR ASSESSMENT:   Consult Calorie Count  ASSESSMENT:   42 y.o. 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 PMH includes: DM.  Meds reviewed: lipitor, semglee, senokot, Vit D. Labs reviewed: Creatinine elevated.   Consult for calorie count. Pt is oriented x2. Per record, pt has been eating 0-25% of his meals over the past 7 days. Discussed calorie count with RN. RD also messaged MD about potential need for cortrak to help pt meet his needs. Awaiting response. Pt currently has nutritional supplements ordered. RD will continue to closely monitor PO intakes.   NUTRITION - FOCUSED PHYSICAL EXAM:  Remote assessment.   Diet Order:   Diet Order             DIET - DYS 1 Room service appropriate? No; Fluid consistency: Honey Thick  Diet effective now                   EDUCATION NEEDS:   Not appropriate for education at this time  Skin:  Skin Assessment: Reviewed RN Assessment  Last BM:  7/2 - type 7  Height:   Ht Readings from Last 1 Encounters:  06/27/23 6\' 1"  (1.854 m)    Weight:   Wt Readings from Last 1 Encounters:  06/28/23 85.2 kg    Ideal Body Weight:     BMI:  Body mass index is 24.78 kg/m.  Estimated Nutritional Needs:   Kcal:  2130-2550 kcals  Protein:  105-125 gm  Fluid:  2.1-2.5 L  Bethann Humble, RD, LDN, CNSC.

## 2023-06-28 NOTE — Plan of Care (Signed)
Problem: RH Balance Goal: LTG: Patient will maintain dynamic sitting balance (OT) Description: LTG:  Patient will maintain dynamic sitting balance with assistance during activities of daily living (OT) Flowsheets (Taken 06/28/2023 1251) LTG: Pt will maintain dynamic sitting balance during ADLs with: Supervision/Verbal cueing Goal: LTG Patient will maintain dynamic standing with ADLs (OT) Description: LTG:  Patient will maintain dynamic standing balance with assist during activities of daily living (OT)  Flowsheets (Taken 06/28/2023 1251) LTG: Pt will maintain dynamic standing balance during ADLs with: Minimal Assistance - Patient > 75%   Problem: Sit to Stand Goal: LTG:  Patient will perform sit to stand in prep for activites of daily living with assistance level (OT) Description: LTG:  Patient will perform sit to stand in prep for activites of daily living with assistance level (OT) Flowsheets (Taken 06/28/2023 1251) LTG: PT will perform sit to stand in prep for activites of daily living with assistance level: Contact Guard/Touching assist   Problem: RH Eating Goal: LTG Patient will perform eating w/assist, cues/equip (OT) Description: LTG: Patient will perform eating with assist, with/without cues using equipment (OT) Flowsheets (Taken 06/28/2023 1251) LTG: Pt will perform eating with assistance level of: Supervision/Verbal cueing   Problem: RH Grooming Goal: LTG Patient will perform grooming w/assist,cues/equip (OT) Description: LTG: Patient will perform grooming with assist, with/without cues using equipment (OT) Flowsheets (Taken 06/28/2023 1251) LTG: Pt will perform grooming with assistance level of: Supervision/Verbal cueing   Problem: RH Bathing Goal: LTG Patient will bathe all body parts with assist levels (OT) Description: LTG: Patient will bathe all body parts with assist levels (OT) Flowsheets (Taken 06/28/2023 1251) LTG: Pt will perform bathing with assistance level/cueing: Minimal  Assistance - Patient > 75%   Problem: RH Dressing Goal: LTG Patient will perform upper body dressing (OT) Description: LTG Patient will perform upper body dressing with assist, with/without cues (OT). Flowsheets (Taken 06/28/2023 1251) LTG: Pt will perform upper body dressing with assistance level of: Minimal Assistance - Patient > 75% Goal: LTG Patient will perform lower body dressing w/assist (OT) Description: LTG: Patient will perform lower body dressing with assist, with/without cues in positioning using equipment (OT) Flowsheets (Taken 06/28/2023 1251) LTG: Pt will perform lower body dressing with assistance level of: Moderate Assistance - Patient 50 - 74%   Problem: RH Toileting Goal: LTG Patient will perform toileting task (3/3 steps) with assistance level (OT) Description: LTG: Patient will perform toileting task (3/3 steps) with assistance level (OT)  Flowsheets (Taken 06/28/2023 1251) LTG: Pt will perform toileting task (3/3 steps) with assistance level: Moderate Assistance - Patient 50 - 74%   Problem: RH Functional Use of Upper Extremity Goal: LTG Patient will use RT/LT upper extremity as a (OT) Description: LTG: Patient will use right/left upper extremity as a stabilizer/gross assist/diminished/nondominant/dominant level with assist, with/without cues during functional activity (OT) Flowsheets (Taken 06/28/2023 1251) LTG: Use of upper extremity in functional activities: RUE as a stabilizer LTG: Pt will use upper extremity in functional activity with assistance level of: Supervision/Verbal cueing   Problem: RH Toilet Transfers Goal: LTG Patient will perform toilet transfers w/assist (OT) Description: LTG: Patient will perform toilet transfers with assist, with/without cues using equipment (OT) Flowsheets (Taken 06/28/2023 1251) LTG: Pt will perform toilet transfers with assistance level of: Minimal Assistance - Patient > 75%   Problem: RH Tub/Shower Transfers Goal: LTG Patient will  perform tub/shower transfers w/assist (OT) Description: LTG: Patient will perform tub/shower transfers with assist, with/without cues using equipment (OT) Flowsheets (Taken 06/28/2023 1251)  LTG: Pt will perform tub/shower stall transfers with assistance level of: Moderate Assistance - Patient 50 - 74% LTG: Pt will perform tub/shower transfers from: Tub/shower combination

## 2023-06-28 NOTE — Progress Notes (Signed)
PROGRESS NOTE   Subjective/Complaints:  Aphasic , inconsistent Y/N nods  ROS- aphasia   Objective:   DG Chest 2 View  Result Date: 06/27/2023 CLINICAL DATA:  Emesis EXAM: CHEST - 2 VIEW COMPARISON:  06/15/2023 CT FINDINGS: Cardiac shadow is mildly enlarged. Lungs are well aerated bilaterally. No focal infiltrate or effusion is seen. Contrast is noted within the colon related to prior barium swallow. No bony abnormality is seen. IMPRESSION: No active cardiopulmonary disease. Electronically Signed   By: Alcide Clever M.D.   On: 06/27/2023 20:12   DG Abd Portable 1V  Result Date: 06/27/2023 CLINICAL DATA:  Constipation EXAM: PORTABLE ABDOMEN - 1 VIEW COMPARISON:  06/21/2023 FINDINGS: Contrast within the colon and rectum is identified. No dilated bowel loops are noted. No significant stool burden identified. No other significant abnormalities noted. IMPRESSION: No evidence of bowel obstruction or significant stool burden. Electronically Signed   By: Harmon Pier M.D.   On: 06/27/2023 13:17   Recent Labs    06/26/23 0708 06/28/23 0552  WBC 10.5 10.2  HGB 9.2* 10.0*  HCT 28.5* 30.4*  PLT 270 295   Recent Labs    06/26/23 0708 06/28/23 0552  NA 141 138  K 3.8 4.1  CL 104 101  CO2 28 28  GLUCOSE 275* 179*  BUN 15 12  CREATININE 1.31* 1.56*  CALCIUM 8.4* 8.7*    Intake/Output Summary (Last 24 hours) at 06/28/2023 0901 Last data filed at 06/28/2023 0826 Gross per 24 hour  Intake 240 ml  Output 850 ml  Net -610 ml        Physical Exam: Vital Signs Blood pressure (!) 159/88, pulse (!) 104, temperature 98.8 F (37.1 C), temperature source Oral, resp. rate 16, height 6\' 1"  (1.854 m), weight 85.2 kg, SpO2 98 %. Aphasic will give thumbs up but inconsistent with Y/N head nods  General: No acute distress Mood and affect are appropriate Heart: tachycardia Regular rhythm no rubs murmurs or extra sounds Lungs: Clear to  auscultation, breathing unlabored, no rales or wheezes Abdomen: Positive bowel sounds, soft nontender to palpation, nondistended Extremities: No clubbing, cyanosis, or edema Skin: No evidence of breakdown, no evidence of rash Neurologic: Cranial nerves II through XII intact, motor strength is 5/5 in left and 0/5 right  deltoid, bicep, tricep, grip, hip flexor, knee extensors, ankle dorsiflexor and plantar flexor Sensory exam reduced pinch sensation on RIght   Musculoskeletal: Full range of motion in all 4 extremities. No joint swelling    Assessment/Plan: 1. Functional deficits which require 3+ hours per day of interdisciplinary therapy in a comprehensive inpatient rehab setting. Physiatrist is providing close team supervision and 24 hour management of active medical problems listed below. Physiatrist and rehab team continue to assess barriers to discharge/monitor patient progress toward functional and medical goals  Care Tool:  Bathing              Bathing assist       Upper Body Dressing/Undressing Upper body dressing        Upper body assist      Lower Body Dressing/Undressing Lower body dressing            Lower  body assist       Toileting Toileting    Toileting assist       Transfers Chair/bed transfer  Transfers assist           Locomotion Ambulation   Ambulation assist              Walk 10 feet activity   Assist           Walk 50 feet activity   Assist           Walk 150 feet activity   Assist           Walk 10 feet on uneven surface  activity   Assist           Wheelchair     Assist               Wheelchair 50 feet with 2 turns activity    Assist            Wheelchair 150 feet activity     Assist          Blood pressure (!) 159/88, pulse (!) 104, temperature 98.8 F (37.1 C), temperature source Oral, resp. rate 16, height 6\' 1"  (1.854 m), weight 85.2 kg, SpO2 98  %.  Medical Problem List and Plan: 1. 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  Antithrombotics: -DVT/anticoagulation:  Pharmaceutical: Lovenox             -antiplatelet therapy: DAPT for at leasat  3 months. Has declined stenting.   3. Pain Management: Tylenol prn.  4. Mood/Behavior/Sleep: LCSW to follow for evaluation and support when appropriate.              -antipsychotic agents: N/A 5. Neuropsych/cognition: This patient is not capable of making decisions on his own behalf. 6. Skin/Wound Care: Routine pressure relief measures.  7. Fluids/Electrolytes/Nutrition: Strict I/O--start calorie count but will likely need cortak replaced.  --check CMET in am.  8. 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 HTN: Monitor BP TID--long term goal normotensive             --continue amlodipine 10 mg/day and coreg  6.25 mg BID.  Vitals:   06/27/23 1940 06/28/23 0456  BP: (!) 162/98 (!) 159/88  Pulse: 92 (!) 104  Resp: 16 16  Temp: 98.5 F (36.9 C) 98.8 F (37.1 C)  SpO2: 99% 98%   Keep in 140-180 range due to severe intracranial stenosis  10. T2DM w/peripheral neuropathy: Hgb A1c- 11.7. Was on Lantus 30 units PTA?  --Continue Insulin glargline-->was decreased to  5 units/HS today(to start tomorrow as received 10 units this am).   --Hold every 4 hours novolog as not on TF at this moment.  CBG (last 3)  Recent Labs    06/27/23 1710 06/27/23 2100 06/28/23 0613  GLUCAP 174* 136* 156*   Adequate control 7/3 11. Acute on chronic renal failure?:  Improved with IVF for hydration-->was d/c yesterday but will resume with questionable intake --.SCr 1.02 in 09/22 per chart review. SCr 1.57 @ admission-->1.29-->1.31 12. Severe dysphagia: Continue D1,  honey liquids. Willing to take liquids-->will offer thickened Glucerna with  meals for now.             --check CXR to rule out aspiration event this am.  13. Hyperlipidemia: LDL 265. On Lipitor 80 mg daily. 14. Severe vitamin D deficiency: On Ergocalciferol weekly with dialy low supplement.  15. ABLA: Hgb 13.1 @ admission-->11.1-->9.2. Will order stool guaiacs.              --Senna added at bedtime as last BM 06/29.  16. Low grade fevers: Encourage pulmonary hygiene.  17. Hx tobacco/Marijuana use: Continue  nicotine patch.  Encourage cessation of Marijuana.  18. Intermittent episodes of N/V: Question due to gastroparesis (Multiple ED visits in the past for intractable N/V). 19. Nasal congestion- ordered Flonase 1 sprays daily.    LOS: 1 days A FACE TO FACE EVALUATION WAS PERFORMED  Erick Colace 06/28/2023, 9:01 AM

## 2023-06-29 DIAGNOSIS — I63512 Cerebral infarction due to unspecified occlusion or stenosis of left middle cerebral artery: Secondary | ICD-10-CM | POA: Diagnosis not present

## 2023-06-29 LAB — GLUCOSE, CAPILLARY
Glucose-Capillary: 237 mg/dL — ABNORMAL HIGH (ref 70–99)
Glucose-Capillary: 239 mg/dL — ABNORMAL HIGH (ref 70–99)
Glucose-Capillary: 211 mg/dL — ABNORMAL HIGH (ref 70–99)
Glucose-Capillary: 246 mg/dL — ABNORMAL HIGH (ref 70–99)

## 2023-06-29 MED ORDER — INSULIN ASPART 100 UNIT/ML IJ SOLN
0.0000 [IU] | Freq: Every day | INTRAMUSCULAR | Status: DC
  Administered 2023-06-29: 2 [IU] via SUBCUTANEOUS

## 2023-06-29 MED ORDER — INSULIN GLARGINE-YFGN 100 UNIT/ML ~~LOC~~ SOLN
8.0000 [IU] | Freq: Every day | SUBCUTANEOUS | Status: DC
  Administered 2023-06-29 – 2023-07-02 (×3): 8 [IU] via SUBCUTANEOUS
  Filled 2023-06-29 (×4): qty 0.08

## 2023-06-29 MED ORDER — ENSURE MAX PROTEIN PO LIQD: 11.0000 [oz_av] | Freq: Two times a day (BID) | ORAL | Status: DC

## 2023-06-29 MED ORDER — INSULIN ASPART 100 UNIT/ML IJ SOLN
0.0000 [IU] | Freq: Three times a day (TID) | INTRAMUSCULAR | Status: DC
  Administered 2023-06-29 (×2): 5 [IU] via SUBCUTANEOUS
  Administered 2023-06-30 (×2): 3 [IU] via SUBCUTANEOUS

## 2023-06-29 MED ORDER — GLUCERNA SHAKE PO LIQD: 237.0000 mL | Freq: Three times a day (TID) | ORAL | Status: DC

## 2023-06-29 MED ORDER — ENSURE MAX PROTEIN PO LIQD: 11.0000 [oz_av] | Freq: Three times a day (TID) | ORAL | Status: DC

## 2023-06-29 NOTE — Progress Notes (Signed)
Occupational Therapy Session Note  Patient Details  Name: Zachary Winters MRN: [id number] Date of Birth: [birth year]  Today's Date: 06/29/2023 OT Individual Time: 8295-6213 OT Individual Time Calculation (min): 42 min    Short Term Goals: Week 1:  OT Short Term Goal 1 (Week 1): pt will be able to hold static sit balance with CGA to enable him to sit safely on a BSC. OT Short Term Goal 2 (Week 1): Pt will be able to sit to stand in a stedy lift with mod A to enable a safe transfer to a BSC. OT Short Term Goal 3 (Week 1): Pt will demonstrate improved R side attention by completing RUE self ROM to prevent contractures due to hypertone, OT Short Term Goal 4 (Week 1): Pt will sit to stand with max A to enable care givers to pull clothing up over hips.  Skilled Therapeutic Interventions/Progress Updates: Patient received sitting in w/c with LUE on 1/2 lap tray. Patient assisted with RUE ROM and positioning. Patient able to activate biceps, but not responding to vc's for pushing, shrugging and scapular movements for AAROM. Patient leaning back over the w/c back into thoracic extension. Patient asked if he wanted to get back in bed but indicated No with head shake. Patient given white board in an attempt to better communicate patient's comfort level, but patient shook head NO. Worked on repositioning in w/c with scooting Max assist of one to better improve comfort. Patient quickly returned to sacral sitting position. Consulted with nursing tech regarding patient position, when tech asked patient if he would like to get in bed patient nodded head yes. Using steady transferred patient back to bed with assist of two. Positioned HOB at 35 degrees and positioned RUE on pillow to prevent excessive elbow flexion or inattention. Patient closed eyes and turned head once placed in bed. Missed 15 minutes of treatment due to fatigue. Will continue with skilled OT POC.     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, honey thick liquids only Restrictions Weight Bearing Restrictions: No General: General OT Amount of Missed Time: 15 Minutes   Pain:Patient not indicating any pain with yes no question   ADL: ADL Eating: Dependent Grooming: Dependent Upper Body Bathing: Dependent Where Assessed-Upper Body Bathing: Edge of bed Lower Body Bathing: Dependent Where Assessed-Lower Body Bathing: Bed level Upper Body Dressing: Maximal assistance Where Assessed-Upper Body Dressing: Edge of bed Lower Body Dressing: Dependent Where Assessed-Lower Body Dressing: Bed level Toileting: Not assessed Toilet Transfer: Not assessed   Balance Balance Balance Assessed: (P) Yes Standardized Balance Assessment Standardized Balance Assessment: (P) PASS     Therapy/Group: Individual Therapy  Warnell Forester 06/29/2023, 12:29 PM

## 2023-06-29 NOTE — Progress Notes (Signed)
PROGRESS NOTE   Subjective/Complaints:  Aphasic , inconsistent Y/N nods  ROS- aphasia   Objective:   VAS [place] LOWER EXTREMITY VENOUS (DVT)  Result Date: 06/28/2023  Lower Venous DVT Study Patient Name:  Zachary Winters  Date of Exam:   06/28/2023 Medical Rec #: [id number]        Accession #:    [id number] Date of Birth: Sep 08, 1981        Patient Gender: M Patient Age:   42 years Exam Location:  [facility] Procedure:      VAS [place] LOWER EXTREMITY VENOUS (DVT) Referring Phys: PAMELA LOVE --------------------------------------------------------------------------------  Indications: Stroke.  Limitations: Poor ultrasound/tissue interface. Comparison Study: No previous exams Performing Technologist: Jody Hill RVT, RDMS  Examination Guidelines: A complete evaluation includes B-mode imaging, spectral Doppler, color Doppler, and power Doppler as needed of all accessible portions of each vessel. Bilateral testing is considered an integral part of a complete examination. Limited examinations for reoccurring indications may be performed as noted. The reflux portion of the exam is performed with the patient in reverse Trendelenburg.  +---------+---------------+---------+-----------+----------+--------------+ RIGHT    CompressibilityPhasicitySpontaneityPropertiesThrombus Aging +---------+---------------+---------+-----------+----------+--------------+ CFV      Full           Yes      Yes                                 +---------+---------------+---------+-----------+----------+--------------+ SFJ      Full                                                        +---------+---------------+---------+-----------+----------+--------------+ FV Prox  Full           Yes      Yes                                 +---------+---------------+---------+-----------+----------+--------------+ FV Mid   Full           Yes      Yes                                  +---------+---------------+---------+-----------+----------+--------------+ FV DistalFull           Yes      Yes                                 +---------+---------------+---------+-----------+----------+--------------+ PFV      Full                                                        +---------+---------------+---------+-----------+----------+--------------+ POP  Full           Yes      Yes                                 +---------+---------------+---------+-----------+----------+--------------+ PTV      Full                                                        +---------+---------------+---------+-----------+----------+--------------+ PERO     Full                                                        +---------+---------------+---------+-----------+----------+--------------+   +--------+---------------+---------+-----------+----------+--------------------+ LEFT    CompressibilityPhasicitySpontaneityPropertiesThrombus Aging       +--------+---------------+---------+-----------+----------+--------------------+ CFV     Full           Yes      Yes                                       +--------+---------------+---------+-----------+----------+--------------------+ SFJ     Full                                                              +--------+---------------+---------+-----------+----------+--------------------+ FV Prox Full           Yes      Yes                                       +--------+---------------+---------+-----------+----------+--------------------+ FV Mid  Full           Yes      Yes                                       +--------+---------------+---------+-----------+----------+--------------------+ FV      Full           Yes      Yes                                       Distal                                                                     +--------+---------------+---------+-----------+----------+--------------------+ PFV                    Yes      Yes  patent by                                                                 color/doppler        +--------+---------------+---------+-----------+----------+--------------------+ POP     Full           Yes      Yes                                       +--------+---------------+---------+-----------+----------+--------------------+ PTV     Full                                                              +--------+---------------+---------+-----------+----------+--------------------+ PERO    Full                                                              +--------+---------------+---------+-----------+----------+--------------------+     Summary: BILATERAL: - No evidence of deep vein thrombosis seen in the lower extremities, bilaterally. -No evidence of popliteal cyst, bilaterally.   *See table(s) above for measurements and observations. Electronically signed by Coral Else MD on 06/28/2023 at 9:18:11 PM.    Final    DG Chest 2 View  Result Date: 06/27/2023 CLINICAL DATA:  Emesis EXAM: CHEST - 2 VIEW COMPARISON:  06/15/2023 CT FINDINGS: Cardiac shadow is mildly enlarged. Lungs are well aerated bilaterally. No focal infiltrate or effusion is seen. Contrast is noted within the colon related to prior barium swallow. No bony abnormality is seen. IMPRESSION: No active cardiopulmonary disease. Electronically Signed   By: Alcide Clever M.D.   On: 06/27/2023 20:12   DG Abd Portable 1V  Result Date: 06/27/2023 CLINICAL DATA:  Constipation EXAM: PORTABLE ABDOMEN - 1 VIEW COMPARISON:  06/21/2023 FINDINGS: Contrast within the colon and rectum is identified. No dilated bowel loops are noted. No significant stool burden identified. No other significant abnormalities noted. IMPRESSION: No evidence of bowel obstruction or significant stool burden. Electronically  Signed   By: Harmon Pier M.D.   On: 06/27/2023 13:17   Recent Labs    06/28/23 0552  WBC 10.2  HGB 10.0*  HCT 30.4*  PLT 295    Recent Labs    06/28/23 0552  NA 138  K 4.1  CL 101  CO2 28  GLUCOSE 179*  BUN 12  CREATININE 1.56*  CALCIUM 8.7*     Intake/Output Summary (Last 24 hours) at 06/29/2023 0945 Last data filed at 06/29/2023 0845 Gross per 24 hour  Intake 958 ml  Output --  Net 958 ml         Physical Exam: Vital Signs Blood pressure (!) 147/96, pulse 94, temperature 98.1 F (36.7 C), temperature source Oral, resp. rate 16, height 6\' 1"  (1.854 m), weight 83.8 kg, SpO2 97 %. Aphasic  will give thumbs up but inconsistent with Y/N head nods  General: No acute distress Mood and affect are appropriate Heart: tachycardia Regular rhythm no rubs murmurs or extra sounds Lungs: Clear to auscultation, breathing unlabored, no rales or wheezes Abdomen: Positive bowel sounds, soft nontender to palpation, nondistended Extremities: No clubbing, cyanosis, or edema Skin: No evidence of breakdown, no evidence of rash Neurologic: Cranial nerves II through XII intact, motor strength is 5/5 in left and 0/5 right  deltoid, bicep, tricep, grip, hip flexor, knee extensors, ankle dorsiflexor and plantar flexor Sensory exam reduced pinch sensation on RIght   Musculoskeletal: Full range of motion in all 4 extremities. No joint swelling    Assessment/Plan: 1. Functional deficits which require 3+ hours per day of interdisciplinary therapy in a comprehensive inpatient rehab setting. Physiatrist is providing close team supervision and 24 hour management of active medical problems listed below. Physiatrist and rehab team continue to assess barriers to discharge/monitor patient progress toward functional and medical goals  Care Tool:  Bathing        Body parts bathed by helper: Right arm, Left arm, Chest, Abdomen, Front perineal area, Buttocks, Right upper leg, Left upper leg, Face,  Left lower leg, Right lower leg     Bathing assist Assist Level: Dependent - Patient 0%     Upper Body Dressing/Undressing Upper body dressing   What is the patient wearing?: Pull over shirt    Upper body assist Assist Level: Total Assistance - Patient < 25%    Lower Body Dressing/Undressing Lower body dressing      What is the patient wearing?: Underwear/pull up, Incontinence brief     Lower body assist Assist for lower body dressing: Dependent - Patient 0%     Toileting Toileting    Toileting assist Assist for toileting: Dependent - Patient 0%     Transfers Chair/bed transfer  Transfers assist  Chair/bed transfer activity did not occur: Safety/medical concerns  Chair/bed transfer assist level: Dependent - mechanical lift (STEDY)     Locomotion Ambulation   Ambulation assist      Assist level: Maximal Assistance - Patient 25 - 49% Assistive device: Other (comment) (L hallway handrail) Max distance: 35 ft   Walk 10 feet activity   Assist     Assist level: Maximal Assistance - Patient 25 - 49% Assistive device: Other (comment) (L hallway hand rail)   Walk 50 feet activity   Assist Walk 50 feet with 2 turns activity did not occur: Safety/medical concerns         Walk 150 feet activity   Assist Walk 150 feet activity did not occur: Safety/medical concerns         Walk 10 feet on uneven surface  activity   Assist Walk 10 feet on uneven surfaces activity did not occur: Safety/medical concerns         Wheelchair     Assist Is the patient using a wheelchair?: Yes Type of Wheelchair: Manual    Wheelchair assist level: Dependent - Patient 0% Max wheelchair distance: 150 ft    Wheelchair 50 feet with 2 turns activity    Assist        Assist Level: Dependent - Patient 0%   Wheelchair 150 feet activity     Assist      Assist Level: Dependent - Patient 0%   Blood pressure (!) 147/96, pulse 94, temperature  98.1 F (36.7 C), temperature source Oral, resp. rate 16, height 6\' 1"  (1.854 m), weight 83.8  kg, SpO2 97 %.  Medical Problem List and Plan: 1. 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  Antithrombotics: -DVT/anticoagulation:  Pharmaceutical: Lovenox, LE doppler neg              -antiplatelet therapy: DAPT for at leasat  3 months. Has declined stenting.   3. Pain Management: Tylenol prn.  4. Mood/Behavior/Sleep: LCSW to follow for evaluation and support when appropriate.              -antipsychotic agents: N/A 5. Neuropsych/cognition: This patient is not capable of making decisions on his own behalf. 6. Skin/Wound Care: Routine pressure relief measures.  7. Fluids/Electrolytes/Nutrition: Strict I/O--start calorie count but will likely need cortak replaced.  Good oral intake trial ensure TID po prior to cortrak 8. 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 HTN: Monitor BP TID--long term goal normotensive             --continue amlodipine 10 mg/day and coreg  6.25 mg BID.  Vitals:   06/28/23 1937 06/29/23 0358  BP: (!) 153/96 (!) 147/96  Pulse: 94   Resp: 16 16  Temp: 98.4 F (36.9 C) 98.1 F (36.7 C)  SpO2: 98% 97%   Keep in 140-180 range due to severe intracranial stenosis - in desired range 7/4, may need to increase coreg and reduce amlodipine  10. T2DM w/peripheral neuropathy: Hgb A1c- 11.7. Was on Lantus 30 units PTA?  --Continue Insulin glargline-->was decreased to  5 units/HS today(to start tomorrow as received 10 units this am).   --Hold every 4 hours novolog as not on TF at this moment.  CBG (last 3)  Recent Labs    06/28/23 1638 06/28/23 2117 06/29/23 0712  GLUCAP 347* 296* 237*    Elevated will increase long  acting first 7/4 11. Acute on chronic renal failure?:  Improved with IVF for hydration-->was d/c yesterday but will resume with questionable intake --.SCr 1.02 in 09/22 per chart review. SCr 1.57 @ admission-->1.29-->1.31 12. Severe dysphagia: Continue D1, honey liquids. Willing to take liquids-->will offer thickened Glucerna with meals for now.          7/2 CXR neg, good oral intake despite honey liquids  13. Hyperlipidemia: LDL 265. On Lipitor 80 mg daily. 14. Severe vitamin D deficiency: On Ergocalciferol weekly with dialy low supplement.  15. ABLA: Hgb 13.1 @ admission-->11.1-->9.2. Will order stool guaiacs.              --Senna added at bedtime as last BM 06/29.  16. Low grade fevers: Encourage pulmonary hygiene.  17. Hx tobacco/Marijuana use: Continue  nicotine patch.  Encourage cessation of Marijuana.  18. Intermittent episodes of N/V: Question due to gastroparesis (Multiple ED visits in the past for intractable N/V). 19. Nasal congestion- ordered Flonase 1 sprays daily.    LOS: 2 days A FACE TO FACE EVALUATION WAS PERFORMED  Erick Colace 06/29/2023, 9:45 AM

## 2023-06-29 NOTE — Progress Notes (Signed)
Calorie Count Note  48 hour calorie count ordered.  RD visited patient who was getting care by NT. NT reports that he is not eating anything.   Diet: dysphagia 1  Supplements: Ensure Max po BID, each supplement provides 150 kcal and 30 grams of protein.   Breakfast: 0% Lunch: 0% Dinner: 0% Supplements: refused/not given   Total intake: 0 kcal (0% of minimum estimated needs)  0 protein (0% of minimum estimated needs)  Nutrition Dx: Inadequate oral intake related to lethargy/confusion and dysphagia as evidenced by meal completion <25%.   Goal: Patient will meet greater than or equal to 90% of their needs   Intervention: Glucerna Shake po TID, each supplement provides 220 kcal and 10 grams of protein which was switched to  Ensure Max po BID, each supplement provides 150 kcal and 30 grams of protein.   Leodis Rains, RDN, LDN  Clinical Nutrition

## 2023-06-29 NOTE — Progress Notes (Addendum)
Physical Therapy Session Note  Patient Details  Name: Zachary Winters MRN: [id number] Date of Birth: [birth year]  Today's Date: 06/29/2023 PT Individual Time: 4098-1191 PT Individual Time Calculation (min): 58 min  Today's Date: 06/29/2023 PT Missed Time: 75 Minutes Missed Time Reason: Other (Comment) (Pt refusal (exact reason unkown 2/2 expressive aphasia). PT witheld this session for pt and care provider safety.)  Short Term Goals: Week 1:  PT Short Term Goal 1 (Week 1): Pt will perform bed mobility at overall ModA. PT Short Term Goal 2 (Week 1): Pt will perform sit<>stand with overall maxA +1 PT Short Term Goal 3 (Week 1): pt will maintain sitting balance to midline with modA +1 for at least . PT Short Term Goal 4 (Week 1): pt will perform squat pivot transfer with MaxA +1 PT Short Term Goal 5 (Week 1): Pt will complete PASS for outcome measure.  SESSION 1 Skilled Therapeutic Interventions/Progress Updates: Patient supine in bed on entrance to room. Patient alert and agreeable to PT session.   Patient gave no indications of pain at beginning session, but did indicate not feeling well when asked by PTA by using head nod "yes." PTA denied needing to use restroom, denied pain, denied nausea (difficult to assess 2/2 expressive aphasia). Pt provided with rest breaks PRN during session. RN notified.   Therapeutic Activity: Bed Mobility: Pt totalA to donn personal shorts on R LE, and was able to adjust shorts halfway around hips using L UE. Pt performed supine to L sidelying with maxA in order to don personal pants and then required totalA to finishing donning around waist. Pt then performed supine to R sidelying with cues to use L UE to pull self over via R HOB rail. Pt then required max cues to initiate push with L UE for truncal elevation, but required max A (pt use of foot board to maintain static sitting.  Transfers: Pt performed sit<>stand transfers via STEDY (grossly maxA/totalA for power  up throughout). Max VC required for patient to use L UE to pull self into standing from EOB with maxA for power-up and anterior weight shift (totalA to place R LE onto STEDY platform, and VC for pt to place L LE. Pt presented with heavy lean to R side and requires mod/maxA to keep upright balance in STEDY. Pt then transferred to [facility] with maxA to control descent, and then dependently transported to day room gym for PASS test. (pt showed significant push to R towards end of session and required + 2 for transfer from edge of mat to [facility]). Pt presented with moments of ability to keep trunk in center position while sitting edge of mat, but was inconsistent. Other PT assisted with sit to stand with PT on L and PTA on R. Pt cued to use  L UE to push off mat, and required maxA + 2. Pt standing balance poor with significant lean to R side.   - PASS performed (7/36)  Patient in [facility] at end of session with brakes locked, belt alarm set, and call bell within reach. Nursing notified of pt's positioning (pt noted to be able to maintain upright in [facility] with hemi table on L).  SESSION 2 Skilled Therapeutic Interventions/Progress Updates: Pt missed 75 min of skilled therapy due to refusing to participate (exact reason unknown 2/2 expressive aphasia). Pt stated the word "home" and when asked if pt wanted to go home, pt nodded "yes." PTA encouraged and consoled pt that this place is to help him  recover decrease caregiver burden. Pt still did not want to participate even when asked if pt would be willing to if it was only to transfer to TIS to assess fit, or to perform EOB activities. Further encouragement halted for pt morale as this is only pt's second day, and for safety as pt is a dense R hemi that pushes heavily to that side. Will re-attempt as schedule and pt availability permits.       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

## 2023-06-29 NOTE — IPOC Note (Signed)
Overall Plan of Care [facility]) Patient Details Name: Zachary Winters MRN: [id number] DOB: 08/07/1981  Admitting Diagnosis: Acute ischemic left middle cerebral artery (MCA) stroke [facility])  Hospital Problems: Principal Problem:   Acute ischemic left middle cerebral artery (MCA) stroke (HCC)     Functional Problem List: Nursing Bowel, Safety, Bladder, Endurance, Nutrition, Medication Management  PT Balance, Endurance, Motor, Nutrition, Perception, Safety, Sensory  OT Balance, Cognition, Endurance, Motor, Perception, Safety, Sensory, Vision  SLP Motor, Linguistic, Nutrition  TR         Basic ADL's: OT Eating, Grooming, Bathing, Dressing, Toileting     Advanced  ADL's: OT       Transfers: PT Bed Mobility, Bed to Chair, Car, Occupational psychologist, Research scientist (life sciences): PT Ambulation, Psychologist, prison and probation services, Stairs     Additional Impairments: OT Fuctional Use of Upper Extremity  SLP Swallowing, Communication comprehension, expression    TR      Anticipated Outcomes Item Anticipated Outcome  Self Feeding supervision  Swallowing  Min A   Basic self-care  min A  Toileting  mod A   Bathroom Transfers min A to toilet, mod A to tub  Bowel/Bladder  manage bowel w mod I and bladder w min assist  Transfers  MinA/ CGA  Locomotion  MinA  Communication  Min A  Cognition  Min A  Pain  n/a  Safety/Judgment  manage w cues   Therapy Plan: PT Intensity: Minimum of 1-2 x/day ,45 to 90 minutes PT Frequency: 5 out of 7 days PT Duration Estimated Length of Stay: >3 weeks OT Intensity: Minimum of 1-2 x/day, 45 to 90 minutes OT Frequency: 5 out of 7 days OT Duration/Estimated Length of Stay: 28-30 days SLP Intensity: Minumum of 1-2 x/day, 30 to 90 minutes SLP Frequency: 3 to 5 out of 7 days SLP Duration/Estimated Length of Stay: 18-22 days   Team Interventions: Nursing Interventions Patient/Family Education, Disease Management/Prevention, Discharge Planning, Bladder  Management, Bowel Management, Medication Management, Dysphagia/Aspiration Precaution Training  PT interventions Ambulation/gait training, Community reintegration, DME/adaptive equipment instruction, Neuromuscular re-education, Psychosocial support, Stair training, UE/LE Strength taining/ROM, Wheelchair propulsion/positioning, Warden/ranger, Discharge planning, Functional electrical stimulation, Pain management, Skin care/wound management, Therapeutic Activities, UE/LE Coordination activities, Cognitive remediation/compensation, Disease management/prevention, Functional mobility training, Patient/family education, Splinting/orthotics, Therapeutic Exercise, Visual/perceptual remediation/compensation  OT Interventions Balance/vestibular training, Cognitive remediation/compensation, DME/adaptive equipment instruction, Discharge planning, Functional mobility training, Neuromuscular re-education, Psychosocial support, Patient/family education, Pain management, Self Care/advanced ADL retraining, Splinting/orthotics, UE/LE Strength taining/ROM, Therapeutic Exercise, Therapeutic Activities, UE/LE Coordination activities, Visual/perceptual remediation/compensation, Wheelchair propulsion/positioning  SLP Interventions Speech/Language facilitation, Therapeutic Activities, Cueing hierarchy, Therapeutic Exercise, Dysphagia/aspiration precaution training, Patient/family education  TR Interventions    SW/CM Interventions Discharge Planning, Psychosocial Support, Disease Management/Prevention, Patient/Family Education   Barriers to Discharge MD  Medical stability  Nursing Decreased caregiver support, Incontinence, Nutrition means 1 level/level entry w sister, and family  PT Decreased caregiver support, Home environment access/layout, Incontinence, Insurance for SNF coverage, Medication compliance, Nutrition means    OT Incontinence    SLP      SW Insurance for SNF coverage, Decreased caregiver support,  Lack of/limited family support     Team Discharge Planning: Destination: PT-Home ,OT- Home , SLP-Home Projected Follow-up: PT-Home health PT, Outpatient PT, 24 hour supervision/assistance, OT-  Home health OT, SLP-Home Health SLP, Outpatient SLP, 24 hour supervision/assistance Projected Equipment Needs: PT-To be determined, OT- Tub/shower bench, 3 in 1 bedside comode, SLP-None recommended by SLP Equipment Details: PT- , OT-  Patient/family  involved in discharge planning: PT- Patient,  OT-Patient, Family member/caregiver, SLP-Patient, Family member/caregiver  MD ELOS: 21-24d Medical Rehab Prognosis:  Fair Assessment: The patient has been admitted for CIR therapies with the diagnosis of Left MCA infarct . The team will be addressing functional mobility, strength, stamina, balance, safety, adaptive techniques and equipment, self-care, bowel and bladder mgt, patient and caregiver education, diabetes and hypertension management . Goals have been set at [facility] . Anticipated discharge destination is home with family assist .        See Team Conference Notes for weekly updates to the plan of care

## 2023-06-29 NOTE — Progress Notes (Signed)
Slept well last night. Remains incontinent of bladder/bowel. No BM passed this shift. Needs stool sample for occult blood. Nods appropriately to questions. Repositioned for comfort. Safety maintained.

## 2023-06-30 ENCOUNTER — Inpatient Hospital Stay (HOSPITAL_COMMUNITY): Payer: Medicaid Other

## 2023-06-30 DIAGNOSIS — I63512 Cerebral infarction due to unspecified occlusion or stenosis of left middle cerebral artery: Secondary | ICD-10-CM | POA: Diagnosis not present

## 2023-06-30 LAB — GLUCOSE, CAPILLARY
Glucose-Capillary: 164 mg/dL — ABNORMAL HIGH (ref 70–99)
Glucose-Capillary: 168 mg/dL — ABNORMAL HIGH (ref 70–99)
Glucose-Capillary: 189 mg/dL — ABNORMAL HIGH (ref 70–99)
Glucose-Capillary: 196 mg/dL — ABNORMAL HIGH (ref 70–99)
Glucose-Capillary: 241 mg/dL — ABNORMAL HIGH (ref 70–99)

## 2023-06-30 LAB — PROTHROMBIN GENE MUTATION

## 2023-06-30 LAB — MAGNESIUM: Magnesium: 2.2 mg/dL (ref 1.7–2.4)

## 2023-06-30 LAB — PHOSPHORUS: Phosphorus: 3.8 mg/dL (ref 2.5–4.6)

## 2023-06-30 MED ORDER — INSULIN ASPART 100 UNIT/ML IJ SOLN
0.0000 [IU] | INTRAMUSCULAR | Status: DC
  Administered 2023-06-30: 3 [IU] via SUBCUTANEOUS
  Administered 2023-06-30: 2 [IU] via SUBCUTANEOUS
  Administered 2023-07-01 (×3): 5 [IU] via SUBCUTANEOUS
  Administered 2023-07-01: 9 [IU] via SUBCUTANEOUS
  Administered 2023-07-01 – 2023-07-02 (×2): 5 [IU] via SUBCUTANEOUS
  Administered 2023-07-02 (×2): 7 [IU] via SUBCUTANEOUS

## 2023-06-30 MED ORDER — FREE WATER
100.0000 mL | Freq: Four times a day (QID) | Status: DC
  Administered 2023-06-30 – 2023-07-02 (×9): 100 mL

## 2023-06-30 MED ORDER — TIZANIDINE HCL 4 MG PO TABS
2.0000 mg | ORAL_TABLET | Freq: Three times a day (TID) | ORAL | Status: DC
  Administered 2023-06-30 – 2023-07-02 (×7): 2 mg via ORAL
  Filled 2023-06-30 (×7): qty 1

## 2023-06-30 MED ORDER — PROSOURCE TF20 ENFIT COMPATIBL EN LIQD
60.0000 mL | Freq: Every day | ENTERAL | Status: DC
  Administered 2023-06-30 – 2023-07-02 (×3): 60 mL
  Filled 2023-06-30 (×4): qty 60

## 2023-06-30 MED ORDER — JEVITY 1.5 CAL/FIBER PO LIQD
1000.0000 mL | ORAL | Status: DC
  Administered 2023-06-30 – 2023-07-02 (×2): 1000 mL
  Filled 2023-06-30 (×9): qty 1000

## 2023-06-30 NOTE — Progress Notes (Signed)
PROGRESS NOTE   Subjective/Complaints:  Aphasic , inconsistent Y/N nods, perseverates on thumbs up sign   ROS- aphasia   Objective:   VAS [place] LOWER EXTREMITY VENOUS (DVT)  Result Date: 06/28/2023  Lower Venous DVT Study Patient Name:  Zachary Winters  Date of Exam:   06/28/2023 Medical Rec #: [id number]        Accession #:    [id number] Date of Birth: [birth year]        Patient Gender: M Patient Age:   42 years Exam Location:  [facility] Procedure:      VAS [place] LOWER EXTREMITY VENOUS (DVT) Referring Phys: PAMELA LOVE --------------------------------------------------------------------------------  Indications: Stroke.  Limitations: Poor ultrasound/tissue interface. Comparison Study: No previous exams Performing Technologist: Jody Hill RVT, RDMS  Examination Guidelines: A complete evaluation includes B-mode imaging, spectral Doppler, color Doppler, and power Doppler as needed of all accessible portions of each vessel. Bilateral testing is considered an integral part of a complete examination. Limited examinations for reoccurring indications may be performed as noted. The reflux portion of the exam is performed with the patient in reverse Trendelenburg.  +---------+---------------+---------+-----------+----------+--------------+ RIGHT    CompressibilityPhasicitySpontaneityPropertiesThrombus Aging +---------+---------------+---------+-----------+----------+--------------+ CFV      Full           Yes      Yes                                 +---------+---------------+---------+-----------+----------+--------------+ SFJ      Full                                                        +---------+---------------+---------+-----------+----------+--------------+ FV Prox  Full           Yes      Yes                                 +---------+---------------+---------+-----------+----------+--------------+ FV Mid   Full            Yes      Yes                                 +---------+---------------+---------+-----------+----------+--------------+ FV DistalFull           Yes      Yes                                 +---------+---------------+---------+-----------+----------+--------------+ PFV      Full                                                        +---------+---------------+---------+-----------+----------+--------------+  POP      Full           Yes      Yes                                 +---------+---------------+---------+-----------+----------+--------------+ PTV      Full                                                        +---------+---------------+---------+-----------+----------+--------------+ PERO     Full                                                        +---------+---------------+---------+-----------+----------+--------------+   +--------+---------------+---------+-----------+----------+--------------------+ LEFT    CompressibilityPhasicitySpontaneityPropertiesThrombus Aging       +--------+---------------+---------+-----------+----------+--------------------+ CFV     Full           Yes      Yes                                       +--------+---------------+---------+-----------+----------+--------------------+ SFJ     Full                                                              +--------+---------------+---------+-----------+----------+--------------------+ FV Prox Full           Yes      Yes                                       +--------+---------------+---------+-----------+----------+--------------------+ FV Mid  Full           Yes      Yes                                       +--------+---------------+---------+-----------+----------+--------------------+ FV      Full           Yes      Yes                                       Distal                                                                     +--------+---------------+---------+-----------+----------+--------------------+ PFV                    Yes  Yes                  patent by                                                                 color/doppler        +--------+---------------+---------+-----------+----------+--------------------+ POP     Full           Yes      Yes                                       +--------+---------------+---------+-----------+----------+--------------------+ PTV     Full                                                              +--------+---------------+---------+-----------+----------+--------------------+ PERO    Full                                                              +--------+---------------+---------+-----------+----------+--------------------+     Summary: BILATERAL: - No evidence of deep vein thrombosis seen in the lower extremities, bilaterally. -No evidence of popliteal cyst, bilaterally.   *See table(s) above for measurements and observations. Electronically signed by Coral Else MD on 06/28/2023 at 9:18:11 PM.    Final    Recent Labs    06/28/23 0552  WBC 10.2  HGB 10.0*  HCT 30.4*  PLT 295    Recent Labs    06/28/23 0552  NA 138  K 4.1  CL 101  CO2 28  GLUCOSE 179*  BUN 12  CREATININE 1.56*  CALCIUM 8.7*     Intake/Output Summary (Last 24 hours) at 06/30/2023 0828 Last data filed at 06/29/2023 1830 Gross per 24 hour  Intake 594 ml  Output --  Net 594 ml         Physical Exam: Vital Signs Blood pressure (!) 135/91, pulse (!) 102, temperature 98.7 F (37.1 C), temperature source Oral, resp. rate 16, height 6\' 1"  (1.854 m), weight 82.1 kg, SpO2 98 %. Aphasic will give thumbs up but inconsistent with Y/N head nods  Also has receptive language issues, when asked to raise LLE he raises LUE  General: No acute distress Mood and affect are appropriate Heart: tachycardia Regular rhythm no rubs murmurs or extra sounds Lungs:  Clear to auscultation, breathing unlabored, no rales or wheezes Abdomen: Positive bowel sounds, soft nontender to palpation, nondistended Extremities: No clubbing, cyanosis, or edema Skin: No evidence of breakdown, no evidence of rash Neurologic: Cranial nerves II through XII intact, motor strength is 5/5 in left and 0/5 right  deltoid, bicep, tricep, grip, hip flexor, knee extensors, ankle dorsiflexor and plantar flexor Flexor withdrawal spasticity RLE, MAS 3 in Left elbow flexors  Sensory exam reduced pinch sensation on RIght   Musculoskeletal:  Full range of motion in all 4 extremities. No joint swelling    Assessment/Plan: 1. Functional deficits which require 3+ hours per day of interdisciplinary therapy in a comprehensive inpatient rehab setting. Physiatrist is providing close team supervision and 24 hour management of active medical problems listed below. Physiatrist and rehab team continue to assess barriers to discharge/monitor patient progress toward functional and medical goals  Care Tool:  Bathing        Body parts bathed by helper: Right arm, Left arm, Chest, Abdomen, Front perineal area, Buttocks, Right upper leg, Left upper leg, Face, Left lower leg, Right lower leg     Bathing assist Assist Level: Dependent - Patient 0%     Upper Body Dressing/Undressing Upper body dressing   What is the patient wearing?: Pull over shirt    Upper body assist Assist Level: Total Assistance - Patient < 25%    Lower Body Dressing/Undressing Lower body dressing      What is the patient wearing?: Underwear/pull up, Incontinence brief     Lower body assist Assist for lower body dressing: Dependent - Patient 0%     Toileting Toileting    Toileting assist Assist for toileting: Dependent - Patient 0%     Transfers Chair/bed transfer  Transfers assist  Chair/bed transfer activity did not occur: Safety/medical concerns  Chair/bed transfer assist level: Dependent -  mechanical lift (STEDY)     Locomotion Ambulation   Ambulation assist      Assist level: Maximal Assistance - Patient 25 - 49% Assistive device: Other (comment) (L hallway handrail) Max distance: 35 ft   Walk 10 feet activity   Assist     Assist level: Maximal Assistance - Patient 25 - 49% Assistive device: Other (comment) (L hallway hand rail)   Walk 50 feet activity   Assist Walk 50 feet with 2 turns activity did not occur: Safety/medical concerns         Walk 150 feet activity   Assist Walk 150 feet activity did not occur: Safety/medical concerns         Walk 10 feet on uneven surface  activity   Assist Walk 10 feet on uneven surfaces activity did not occur: Safety/medical concerns         Wheelchair     Assist Is the patient using a wheelchair?: Yes Type of Wheelchair: Manual    Wheelchair assist level: Dependent - Patient 0% Max wheelchair distance: 150 ft    Wheelchair 50 feet with 2 turns activity    Assist        Assist Level: Dependent - Patient 0%   Wheelchair 150 feet activity     Assist      Assist Level: Dependent - Patient 0%   Blood pressure (!) 135/91, pulse (!) 102, temperature 98.7 F (37.1 C), temperature source Oral, resp. rate 16, height 6\' 1"  (1.854 m), weight 82.1 kg, SpO2 98 %.  Medical Problem List and Plan: 1. 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  Antithrombotics: -DVT/anticoagulation:  Pharmaceutical: Lovenox, LE doppler neg              -antiplatelet therapy: DAPT for at leasat  3 months. Has declined stenting.   3. Pain Management: Tylenol prn.  4. Mood/Behavior/Sleep: LCSW to follow for evaluation and support when appropriate.              -antipsychotic agents: N/A 5.  Neuropsych/cognition: This patient is not capable of making decisions on his own behalf. 6. Skin/Wound Care: Routine pressure relief measures.  7. Fluids/Electrolytes/Nutrition: Strict I/O-inadequate nutrition RD to place cortrak 8. 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 HTN: Monitor BP TID--long term goal normotensive             --continue amlodipine 10 mg/day and coreg  6.25 mg BID.  Vitals:   06/29/23 1950 06/30/23 0546  BP: (!) 143/96 (!) 135/91  Pulse: (!) 109 (!) 102  Resp: 16 16  Temp: 99.8 F (37.7 C) 98.7 F (37.1 C)  SpO2: 100% 98%   Keep in 140-180 range due to severe intracranial stenosis - in desired range 7/4, may need to increase coreg and reduce amlodipine  10. T2DM w/peripheral neuropathy: Hgb A1c- 11.7. Was on Lantus 30 units PTA?  --Continue Insulin glargline-->was decreased to  5 units/HS today(to start tomorrow as received 10 units this am).   --Hold every 4 hours novolog as not on TF at this moment.  CBG (last 3)  Recent Labs    06/29/23 1637 06/29/23 2039 06/30/23 0741  GLUCAP 211* 246* 168*    Elevated will increase long acting first 7/4- improved am CBG also changed SSI to moderate will observe on current regimen 11. Acute on chronic renal failure?:  Improved with IVF for hydration-->was d/c yesterday but will resume with questionable intake --.SCr 1.02 in 09/22 per chart review. SCr 1.57 @ admission-->1.29-->1.31 12. Severe dysphagia: Continue D1, honey liquids. Willing to take liquids-->will offer thickened Glucerna with meals for now.          7/2 CXR neg, good oral intake despite honey liquids  13. Hyperlipidemia: LDL 265. On Lipitor 80 mg daily. 14. Severe vitamin D deficiency: On Ergocalciferol weekly with dialy low supplement.  15. ABLA: Hgb 13.1 @ admission-->11.1-->9.2. Will order stool guaiacs- none collected 7/5.              --Senna added at bedtime as last BM  06/29.  16. Low grade fevers: Encourage pulmonary hygiene.  17. Hx tobacco/Marijuana use: Continue  nicotine patch.  Encourage cessation of Marijuana.  18. Intermittent episodes of N/V: Question due to gastroparesis (Multiple ED visits in the past for intractable N/V). 19. Nasal congestion- ordered Flonase 1 sprays daily.  20.  Developing severe spasticity RLE>RUE will start tizanidine   LOS: 3 days A FACE TO FACE EVALUATION WAS PERFORMED  Erick Colace 06/30/2023, 8:28 AM

## 2023-06-30 NOTE — Progress Notes (Signed)
Occupational Therapy Session Note  Patient Details  Name: Zachary Winters MRN: [id number] Date of Birth: 07/08/1981  Today's Date: 06/30/2023 OT Individual Time: 4098-1191 OT Individual Time Calculation (min): 38 min  7 mins missed d/t pt refusal    Short Term Goals: Week 1:  OT Short Term Goal 1 (Week 1): pt will be able to hold static sit balance with CGA to enable him to sit safely on a BSC. OT Short Term Goal 2 (Week 1): Pt will be able to sit to stand in a stedy lift with mod A to enable a safe transfer to a BSC. OT Short Term Goal 3 (Week 1): Pt will demonstrate improved R side attention by completing RUE self ROM to prevent contractures due to hypertone, OT Short Term Goal 4 (Week 1): Pt will sit to stand with max A to enable care givers to pull clothing up over hips.  Skilled Therapeutic Interventions/Progress Updates:  Pt greeted seated in w/c, pt shaking head "no" to working with this OTA, gesturing to the bed and did nod "yes" and circled yes on board when asked if he wanted to get back to bed. Offered to assist pt with lunch tray with tray set- up however pt declined eating.   Utilized stedy for back to bed transfer with pt able to stand to stedy with MIN A +2 for safety, total A pivot back to bed in stedy. Pt needed MOD A to transiton to supine needing assist to elevate BLEs back to bed. Once EOB worked on PROM/NMR of RUE d/t increased tone.                       Ended session with pt supine in bed with all needs within reach and bed alarm activated.                    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, honey thick liquids only Restrictions Weight Bearing Restrictions: No General: General OT Amount of Missed Time: 7 Minutes  Pain: Pt grimacing with pain when completing ROM, rest breaks and repositioning provided as needed   Therapy/Group: Individual  Therapy  [name] 06/30/2023, 3:53 PM

## 2023-06-30 NOTE — Progress Notes (Signed)
Initial Nutrition Assessment  DOCUMENTATION CODES:   Not applicable  INTERVENTION:  D/c Ensure Max po BID, each supplement provides 150 kcal and 30 grams of protein.   D/c calorie count-- patient's nutrition needs will be met when TF goal is reached and tolerated   Initiate TF's Jevity 1.5 @65  ml/hr (1560 mL) Start at 20 ml/hr and advance by 15 ml q6 hours until goal rate is reached  Prosource TF20 daily  TF's at goal rate provides 2340 kcal, 120 g protein, 1186 ml fluid  Patient is at high risk for refeeding, monitor electrolytes K+, mag and phos   MD to replete electrolytes as necessary  MD to add free water     NUTRITION DIAGNOSIS:   Inadequate oral intake related to lethargy/confusion, dysphagia as evidenced by meal completion < 25%. -ongoing   GOAL:   Patient will meet greater than or equal to 90% of their needs - Cortrak NG tube placed due to little to no po intake  - RD is hopeful he will start progressing to goal with TF's   MONITOR:   PO intake  REASON FOR ASSESSMENT:   New TF Calorie Count  ASSESSMENT:   42 y.o. 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 PMH includes: DM.  Cortrak placed today due to patient's extremely poor po intake despite encouragement from staff and family.  Patient participating in therapy and was not in room at time of visit  NG tube in place with tip located below GE junction in distal stomach per xray imaging.    Labs: Glu 179. Cr 1.56,  Meds: lipitor, plavix, insulin, senokot, Vitamin D  Wt: 190# --> 181# in 3 days? I/O's:  +824 ml since admission    Diet Order:   Diet Order             DIET - DYS 1 Room service appropriate? No; Fluid consistency: Honey Thick  Diet effective now                   EDUCATION NEEDS:   Not appropriate for education at this time  Skin:  Skin Assessment: Reviewed RN Assessment  Last BM:  7/4 type 6  Height:   Ht Readings from Last 1 Encounters:   06/27/23 6\' 1"  (1.854 m)    Weight:   Wt Readings from Last 1 Encounters:  06/30/23 82.1 kg    Ideal Body Weight:     BMI:  Body mass index is 23.88 kg/m.  Estimated Nutritional Needs:   Kcal:  2130-2550 kcals  Protein:  105-125 gm  Fluid:  2.1-2.5 L    Leodis Rains, RDN, LDN  Clinical Nutrition

## 2023-06-30 NOTE — Procedures (Signed)
Cortrak  Person Inserting Tube:  Allyn Bertoni T, RD Tube Type:  Cortrak - 43 inches Tube Size:  10 Tube Location:  Left nare Secured by: Bridle Technique Used to Measure Tube Placement:  Marking at nare/corner of mouth Cortrak Secured At:  66 cm   Cortrak Tube Team Note:  Consult received to place a Cortrak feeding tube.   X-ray is required, abdominal x-ray has been ordered by the Cortrak team. Please confirm tube placement before using the Cortrak tube.   If the tube becomes dislodged please keep the tube and contact the Cortrak team at [email or web address] for replacement.  If after hours and replacement cannot be delayed, place a NG tube and confirm placement with an abdominal x-ray.    Gaylon Melchor RD, LDN For contact information, refer to [facility].   

## 2023-06-30 NOTE — Progress Notes (Signed)
Physical Therapy Session Note  Patient Details  Name: Zachary Winters MRN: [id number] Date of Birth: [birth year]  Today's Date: 06/30/2023 PT Individual Time: 1418-1500 PT Individual Time Calculation (min): 42 min   Short Term Goals: Week 1:  PT Short Term Goal 1 (Week 1): Pt will perform bed mobility at overall ModA. PT Short Term Goal 2 (Week 1): Pt will perform sit<>stand with overall maxA +1 PT Short Term Goal 3 (Week 1): pt will maintain sitting balance to midline with modA +1 for at least . PT Short Term Goal 4 (Week 1): pt will perform squat pivot transfer with MaxA +1 PT Short Term Goal 5 (Week 1): Pt will complete PASS for outcome measure.  Skilled Therapeutic Interventions/Progress Updates:  Patient supine in bed on entrance to room. Patient maintains eyes closed but is awake initially and not readily agreeable to OOB PT session. Sister present. When asked who visitor is, and asked if she is his sister, he initally shakes head over and over, but at the end, does begin to nod head.   Patient with no pain complaint at start of session.  Therapeutic Activity: Pt provided with slow PROM to RUE into elbow extension and wrist/ forearm supination with finger extension to assist with decreasing tone and addressing potential for muscle/ tendon shortening. Then PROM to RLE out of frogleg position and into extension with nuetral hip rotation. Slow oscillations into hip IR and PF.   During PROM, sister with questions and able to provide info re: pt's PLOF and home. Encouragement provided to pt to participate in sessions in order to improve and move slowly toward PLOF. Discussed motor planning/ control, nature of neural healing being a slow process, expectations for progress, local stroke support group.   Education provided to sister re: need to position UE and LE into extension whenever possible but pt will increase flexor tone with  involuntary actions such as coughing, sneezing, stressful  situations. Educated re: slow movements being more successful to reaching range into extension  Patient supine in bed at end of session with brakes locked, bed alarm set, and all needs within reach. Sister present.    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, honey thick liquids only Restrictions Weight Bearing Restrictions: No General:   Vital Signs: Therapy Vitals Pulse Rate: 90 Resp: 16 BP: (!) 124/97 Patient Position (if appropriate): Lying Oxygen Therapy SpO2: 100 % O2 Device: Room Air Pain:  No indication of pain this session.   Therapy/Group: Individual Therapy  Loel Dubonnet PT, DPT, CSRS 06/30/2023, 5:52 PM

## 2023-06-30 NOTE — Progress Notes (Signed)
Physical Therapy Session Note  Patient Details  Name: Zachary Winters MRN: [id number] Date of Birth: Jun 20, 1981  Today's Date: 06/30/2023 PT Individual Time: 1119-1205 PT Individual Time Calculation (min): 46 min   Short Term Goals: Week 1:  PT Short Term Goal 1 (Week 1): Pt will perform bed mobility at overall ModA. PT Short Term Goal 2 (Week 1): Pt will perform sit<>stand with overall maxA +1 PT Short Term Goal 3 (Week 1): pt will maintain sitting balance to midline with modA +1 for at least . PT Short Term Goal 4 (Week 1): pt will perform squat pivot transfer with MaxA +1 PT Short Term Goal 5 (Week 1): Pt will complete PASS for outcome measure.  Skilled Therapeutic Interventions/Progress Updates:  Patient seated upright in TIS /wc on entrance to room. Legs dangling from seat instead of on leg rests. Patient alert and agreeable to PT session.   Patient with no pain complaint at start of session.  Therapeutic Activity: Transfers: Pt performed sit<>stand and stand pivot transfers throughout session with ***. Provided verbal cues for***.  Gait Training:  Pt ambulated 35 ft using L hallway HR with Max/ TotA. Demonstrated ***. Provided vc/ tc for sequencing steps, block to R knee with vc for quad set/ "strong knee", facilitation of glute and quad as well as lateral weight shift.  Neuromuscular Re-ed: NMR facilitated during session with focus on***. Pt guided in ***. NMR performed for improvements in motor control and coordination, balance, sequencing, judgement, and self confidence/ efficacy in performing all aspects of mobility at highest level of independence.   Patient *** at end of session with brakes locked, *** alarm set, and all needs within reach.   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, honey thick liquids only Restrictions Weight Bearing Restrictions:  No General:   Pain:  Pt indicates no when asked if experiencing pain.   Therapy/Group: Individual Therapy  Loel Dubonnet PT, DPT, CSRS 06/30/2023, 5:48 PM

## 2023-06-30 NOTE — Progress Notes (Signed)
Occupational Therapy Session Note  Patient Details  Name: Zachary Winters MRN: [id number] Date of Birth: Aug 01, 1981  Today's Date: 06/30/2023 OT Individual Time: 0945-1100 OT Individual Time Calculation (min): 75 min    Short Term Goals: Week 1:  OT Short Term Goal 1 (Week 1): pt will be able to hold static sit balance with CGA to enable him to sit safely on a BSC. OT Short Term Goal 2 (Week 1): Pt will be able to sit to stand in a stedy lift with mod A to enable a safe transfer to a BSC. OT Short Term Goal 3 (Week 1): Pt will demonstrate improved R side attention by completing RUE self ROM to prevent contractures due to hypertone, OT Short Term Goal 4 (Week 1): Pt will sit to stand with max A to enable care givers to pull clothing up over hips.  Skilled Therapeutic Interventions/Progress Updates:    Pt received in bed ready for therapy.  His grandmother was present.  Explained to pt the focus of therapy session today would be getting out of bed and getting dressed.  Pt nodding head no and turning away. His grandmother got very emotional and told pt "you have to fight to get better because you son needs you, you are all he has".  Told her I was glad she shared this information because it will help [place] find a way to motivated him. She chose to leave as he was about to do self care.    Pt initially continuing to refuse and spoke directly with pt that he has an obligation to work hard in therapy for his son or if he does not participate and improve he may not be able to be well enough to return home.  This "speech" actually did help and pt became quite engaged in session.  He showed improvement with following 1 step directions and did not continue to resist therapy.    ADL Retraining: -bed level to change wet brief.  Cued pt to self cleanse,  pt actively rolled and followed cues to bridge to don pants over hips with max A. -once in w/c, sat at sink to cleanse mouth with toothette, wash UB with mod-max  A and don shirt max.  When asked pt if he could feel hot water on his R hand he shook his head no.    Therapeutic Activity/ Exercise: -R visual scanning exercise with finding objects on R side.  Pt has a strong L gaze preference but he is able to shift his eyes R and find items with only 1 cue to start task.  Transfers: - max A to sit up to EOB and then hold balance with mod A using L hand for support.  -sit to stand in stedy with min A and max cues for hand positioning and to stay seated on pads as he moved from bed to wc (+2 A for safety and equipment management)   Neuromuscular Re-Education:  -self and PROM of RUE on hi low table using towel slides.   -weight bearing in RUE through forearm as he worked on reaching from L to R -lateral reaching to L for dynamic sitting balance   Pt resting in TIS w/c with all needs met. Alarm set and call light in reach.    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, honey thick liquids only Restrictions Weight Bearing Restrictions: No    Pain: Pain Assessment Pain Scale:  0-10 Pain Score: 0-No pain ADL: ADL Eating: Dependent Grooming: Maximal assistance Where Assessed-Grooming: Sitting at sink Upper Body Bathing: Maximal assistance Where Assessed-Upper Body Bathing: Sitting at sink Lower Body Bathing: Dependent Where Assessed-Lower Body Bathing: Bed level Upper Body Dressing: Maximal assistance Where Assessed-Upper Body Dressing: Wheelchair Lower Body Dressing: Dependent Where Assessed-Lower Body Dressing: Bed level Toileting: Not assessed Toilet Transfer: Not assessed    Therapy/Group: Individual Therapy  Zachary Winters 06/30/2023, 12:44 PM

## 2023-07-01 LAB — MAGNESIUM
Magnesium: 2.3 mg/dL (ref 1.7–2.4)
Magnesium: 2.2 mg/dL (ref 1.7–2.4)

## 2023-07-01 LAB — GLUCOSE, CAPILLARY
Glucose-Capillary: 259 mg/dL — ABNORMAL HIGH (ref 70–99)
Glucose-Capillary: 274 mg/dL — ABNORMAL HIGH (ref 70–99)
Glucose-Capillary: 280 mg/dL — ABNORMAL HIGH (ref 70–99)
Glucose-Capillary: 362 mg/dL — ABNORMAL HIGH (ref 70–99)
Glucose-Capillary: 340 mg/dL — ABNORMAL HIGH (ref 70–99)
Glucose-Capillary: 273 mg/dL — ABNORMAL HIGH (ref 70–99)

## 2023-07-01 LAB — PHOSPHORUS
Phosphorus: 4.1 mg/dL (ref 2.5–4.6)
Phosphorus: 3.5 mg/dL (ref 2.5–4.6)

## 2023-07-01 MED ORDER — TAMSULOSIN HCL 0.4 MG PO CAPS
0.4000 mg | ORAL_CAPSULE | Freq: Every day | ORAL | Status: DC
  Administered 2023-07-01 – 2023-07-20 (×20): 0.4 mg via ORAL
  Filled 2023-07-01 (×21): qty 1

## 2023-07-01 NOTE — Progress Notes (Signed)
Physical Therapy Session Note  Patient Details  Name: Zachary Winters MRN: [id number] Date of Birth: December 26, 1981  Today's Date: 07/01/2023 PT Individual Time: 7829-5621 PT Individual Time Calculation (min): 72 min   Short Term Goals: Week 1:  PT Short Term Goal 1 (Week 1): Pt will perform bed mobility at overall ModA. PT Short Term Goal 2 (Week 1): Pt will perform sit<>stand with overall maxA +1 PT Short Term Goal 3 (Week 1): pt will maintain sitting balance to midline with modA +1 for at least . PT Short Term Goal 4 (Week 1): pt will perform squat pivot transfer with MaxA +1 PT Short Term Goal 5 (Week 1): Pt will complete PASS for outcome measure.  Skilled Therapeutic Interventions/Progress Updates: Patient in TIS with Aunt present on entrance to room. Patient alert and agreeable to PT session.   Patient reported no pain with nodding head "no." Aunt was encouraging pt to participate in pt's rehabilitation process. Pt noted to be more engaged throughout session today (more family showed up during session, but were not present during interventions). Pt's aunt reported that pt's sister will bring shoes in for pt to wear during ambulatory/standing activities.   Therapeutic Activity: Bed Mobility: Pt performed sit from EOB to supine with modA (VC to use L UE on R HOB rail to assist in controlling descent). TotalA required to elevate R LE onto bed.  Transfers: Pt performed squat pivot transfers from TIS <> hi/low mat with heavy modA/maxA (to the L with cues to have L UE push off mat). Pt performed squat pivot from TIS to EOB (to the R) with modA and cues to push with L UE while pushing through L LE in sequence with anterior lean over PTA's R shoulder for head/hip relationship. Pt performed sit to stand from TIS during NMRE gait training (L UE on handrail) with minA. Pt cued to use L UE to push self up in sequence with pushing through L LE.   Gait Training:  Pt ambulated 35' x 1, 15' x 1 using L  UE support on hallway hand rail outside of main gym with PTA on R side underneath pt's R arm facilitating R LE advancement/block due to buckling. Pt with R DF wrap for stability. Pt required totalA to advance R LE through swing, and maxA to prevent R knee from buckling. Pt cued to increase step length on L LE per decrease length presentation. Patient required maxA to laterally weight shift in order to advance contralateral LE though swing phase, and to increase foot clearance. Pt R LE in excessive external rotation throughout.   Neuromuscular Re-ed: NMR facilitated during session with focus on dynamic sitting balance. - Forward reaching while sitting edge of mat. Rehab tech with cones for pt to grasp with L UE, and cues for patient to place cone in bin sitting to the R of patient to increase R sided attention and truncal control. Pt cued to correct significant R lateral lean when placing cones in bin (tactile cues for pt to adjust when leaning too far - modA when pt leans to far with pt using L UE to pull self back to center). Pt performed 2 rounds - one round with reaching anteriorly to the L, and the other with crossing midline. Pt noted to have increased trunk control in comparison to previous session.  NMR performed for improvements in motor control and coordination, balance, sequencing, judgement, and self confidence/ efficacy in performing all aspects of mobility at highest level of  independence.   Patient supine in bed at end of session with brakes locked, bed alarm set, family present and all needs within reach.      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

## 2023-07-01 NOTE — Plan of Care (Deleted)
Problem: Consults Goal: RH STROKE PATIENT EDUCATION Description: See Patient Education module for education specifics  Outcome: Completed/Met   Problem: RH BOWEL ELIMINATION Goal: RH STG MANAGE BOWEL WITH ASSISTANCE Description: STG Manage Bowel with mod I Assistance. Outcome: Completed/Met Goal: RH STG MANAGE BOWEL W/MEDICATION W/ASSISTANCE Description: STG Manage Bowel with Medication with mod I  Assistance. Outcome: Completed/Met   Problem: RH BLADDER ELIMINATION Goal: RH STG MANAGE BLADDER WITH ASSISTANCE Description: STG Manage Bladder With toileting Assistance Outcome: Completed/Met   Problem: RH SAFETY Goal: RH STG ADHERE TO SAFETY PRECAUTIONS W/ASSISTANCE/DEVICE Description: STG Adhere to Safety Precautions With cues Assistance/Device. Outcome: Completed/Met   Problem: RH KNOWLEDGE DEFICIT Goal: RH STG INCREASE KNOWLEDGE OF DIABETES Description: Patient and family will be able to manage DM with medications and dietary modifications using educational resources independently Outcome: Completed/Met Goal: RH STG INCREASE KNOWLEDGE OF HYPERTENSION Description: Patient and family will be able to manage HTN with medications and dietary modifications using educational resources independently Outcome: Completed/Met Goal: RH STG INCREASE KNOWLEDGE OF DYSPHAGIA/FLUID INTAKE Description: Patient and family will be able to manage Dysphagia, medications and dietary modifications using educational resources independently Outcome: Completed/Met Goal: RH STG INCREASE KNOWLEGDE OF HYPERLIPIDEMIA Description: Patient and family will be able to manage HLD with medications and dietary modifications using educational resources independently Outcome: Completed/Met Goal: RH STG INCREASE KNOWLEDGE OF STROKE PROPHYLAXIS Description: Patient and family will be able to manage secondary risks with medications and dietary modifications using educational resources independently Outcome:  Completed/Met   Problem: Education: Goal: Knowledge of disease or condition will improve Outcome: Completed/Met Goal: Knowledge of secondary prevention will improve (MUST DOCUMENT ALL) Outcome: Completed/Met Goal: Knowledge of patient specific risk factors will improve Loraine Leriche N/A or DELETE if not current risk factor) Outcome: Completed/Met   Problem: Ischemic Stroke/TIA Tissue Perfusion: Goal: Complications of ischemic stroke/TIA will be minimized Outcome: Completed/Met   Problem: Coping: Goal: Will verbalize positive feelings about self Outcome: Completed/Met Goal: Will identify appropriate support needs Outcome: Completed/Met   Problem: Health Behavior/Discharge Planning: Goal: Ability to manage health-related needs will improve Outcome: Completed/Met Goal: Goals will be collaboratively established with patient/family Outcome: Completed/Met   Problem: Self-Care: Goal: Ability to participate in self-care as condition permits will improve Outcome: Completed/Met Goal: Verbalization of feelings and concerns over difficulty with self-care will improve Outcome: Completed/Met Goal: Ability to communicate needs accurately will improve Outcome: Completed/Met   Problem: Nutrition: Goal: Risk of aspiration will decrease Outcome: Completed/Met Goal: Dietary intake will improve Outcome: Completed/Met   Problem: RH Swallowing Goal: LTG Patient will consume least restrictive diet using compensatory strategies with assistance (SLP) Description: LTG:  Patient will consume least restrictive diet using compensatory strategies with assistance (SLP) Outcome: Completed/Met Goal: LTG Patient will participate in dysphagia therapy to increase swallow function with assistance (SLP) Description: LTG:  Patient will participate in dysphagia therapy to increase swallow function with assistance (SLP) Outcome: Completed/Met Goal: LTG Pt will demonstrate functional change in swallow as evidenced by  bedside/clinical objective assessment (SLP) Description: LTG: Patient will demonstrate functional change in swallow as evidenced by bedside/clinical objective assessment (SLP) Outcome: Completed/Met   Problem: RH Comprehension Communication Goal: LTG Patient will comprehend basic/complex auditory (SLP) Description: LTG: Patient will comprehend basic/complex auditory information with cues (SLP). Outcome: Completed/Met   Problem: RH Expression Communication Goal: LTG Patient will express needs/wants via multi-modal(SLP) Description: LTG:  Patient will express needs/wants via multi-modal communication (gestures/written, etc) with cues (SLP) Outcome: Completed/Met Goal: LTG Patient will verbally express basic/complex needs(SLP) Description: LTG:  Patient  will verbally express basic/complex needs, wants or ideas with cues  (SLP) Outcome: Completed/Met   Problem: RH Balance Goal: LTG: Patient will maintain dynamic sitting balance (OT) Description: LTG:  Patient will maintain dynamic sitting balance with assistance during activities of daily living (OT) Outcome: Completed/Met Goal: LTG Patient will maintain dynamic standing with ADLs (OT) Description: LTG:  Patient will maintain dynamic standing balance with assist during activities of daily living (OT)  Outcome: Completed/Met   Problem: Sit to Stand Goal: LTG:  Patient will perform sit to stand in prep for activites of daily living with assistance level (OT) Description: LTG:  Patient will perform sit to stand in prep for activites of daily living with assistance level (OT) Outcome: Completed/Met   Problem: RH Eating Goal: LTG Patient will perform eating w/assist, cues/equip (OT) Description: LTG: Patient will perform eating with assist, with/without cues using equipment (OT) Outcome: Completed/Met   Problem: RH Grooming Goal: LTG Patient will perform grooming w/assist,cues/equip (OT) Description: LTG: Patient will perform grooming  with assist, with/without cues using equipment (OT) Outcome: Completed/Met   Problem: RH Bathing Goal: LTG Patient will bathe all body parts with assist levels (OT) Description: LTG: Patient will bathe all body parts with assist levels (OT) Outcome: Completed/Met   Problem: RH Dressing Goal: LTG Patient will perform upper body dressing (OT) Description: LTG Patient will perform upper body dressing with assist, with/without cues (OT). Outcome: Completed/Met Goal: LTG Patient will perform lower body dressing w/assist (OT) Description: LTG: Patient will perform lower body dressing with assist, with/without cues in positioning using equipment (OT) Outcome: Completed/Met   Problem: RH Toileting Goal: LTG Patient will perform toileting task (3/3 steps) with assistance level (OT) Description: LTG: Patient will perform toileting task (3/3 steps) with assistance level (OT)  Outcome: Completed/Met   Problem: RH Functional Use of Upper Extremity Goal: LTG Patient will use RT/LT upper extremity as a (OT) Description: LTG: Patient will use right/left upper extremity as a stabilizer/gross assist/diminished/nondominant/dominant level with assist, with/without cues during functional activity (OT) Outcome: Completed/Met   Problem: RH Toilet Transfers Goal: LTG Patient will perform toilet transfers w/assist (OT) Description: LTG: Patient will perform toilet transfers with assist, with/without cues using equipment (OT) Outcome: Completed/Met   Problem: RH Tub/Shower Transfers Goal: LTG Patient will perform tub/shower transfers w/assist (OT) Description: LTG: Patient will perform tub/shower transfers with assist, with/without cues using equipment (OT) Outcome: Completed/Met   Problem: Inadequate Intake (NI-2.1) Goal: Food and/or nutrient delivery Description: Individualized approach for food/nutrient provision. Outcome: Completed/Met   Problem: RH Balance Goal: LTG Patient will maintain dynamic  sitting balance (PT) Description: LTG:  Patient will maintain dynamic sitting balance with assistance during mobility activities (PT) Outcome: Completed/Met Goal: LTG Patient will maintain dynamic standing balance (PT) Description: LTG:  Patient will maintain dynamic standing balance with assistance during mobility activities (PT) Outcome: Completed/Met   Problem: Sit to Stand Goal: LTG:  Patient will perform sit to stand with assistance level (PT) Description: LTG:  Patient will perform sit to stand with assistance level (PT) Outcome: Completed/Met   Problem: RH Bed Mobility Goal: LTG Patient will perform bed mobility with assist (PT) Description: LTG: Patient will perform bed mobility with assistance, with/without cues (PT). Outcome: Completed/Met   Problem: RH Bed to Chair Transfers Goal: LTG Patient will perform bed/chair transfers w/assist (PT) Description: LTG: Patient will perform bed to chair transfers with assistance (PT). Outcome: Completed/Met   Problem: RH Car Transfers Goal: LTG Patient will perform car transfers with assist (PT) Description:  LTG: Patient will perform car transfers with assistance (PT). Outcome: Completed/Met   Problem: RH Furniture Transfers Goal: LTG Patient will perform furniture transfers w/assist (OT/PT) Description: LTG: Patient will perform furniture transfers  with assistance (OT/PT). Outcome: Completed/Met   Problem: RH Ambulation Goal: LTG Patient will ambulate in controlled environment (PT) Description: LTG: Patient will ambulate in a controlled environment, # of feet with assistance (PT). Outcome: Completed/Met Goal: LTG Patient will ambulate in home environment (PT) Description: LTG: Patient will ambulate in home environment, # of feet with assistance (PT). Outcome: Completed/Met   Problem: RH Wheelchair Mobility Goal: LTG Patient will propel w/c in controlled environment (PT) Description: LTG: Patient will propel wheelchair in  controlled environment, # of feet with assist (PT) Outcome: Completed/Met Goal: LTG Patient will propel w/c in home environment (PT) Description: LTG: Patient will propel wheelchair in home environment, # of feet with assistance (PT). Outcome: Completed/Met   Problem: Education: Goal: Ability to describe self-care measures that may prevent or decrease complications (Diabetes Survival Skills Education) will improve Outcome: Completed/Met Goal: Individualized Educational Video(s) Outcome: Completed/Met   Problem: Coping: Goal: Ability to adjust to condition or change in health will improve Outcome: Completed/Met   Problem: Fluid Volume: Goal: Ability to maintain a balanced intake and output will improve Outcome: Completed/Met   Problem: Health Behavior/Discharge Planning: Goal: Ability to identify and utilize available resources and services will improve Outcome: Completed/Met Goal: Ability to manage health-related needs will improve Outcome: Completed/Met   Problem: Metabolic: Goal: Ability to maintain appropriate glucose levels will improve Outcome: Completed/Met   Problem: Nutritional: Goal: Maintenance of adequate nutrition will improve Outcome: Completed/Met Goal: Progress toward achieving an optimal weight will improve Outcome: Completed/Met   Problem: Skin Integrity: Goal: Risk for impaired skin integrity will decrease Outcome: Completed/Met   Problem: Tissue Perfusion: Goal: Adequacy of tissue perfusion will improve Outcome: Completed/Met

## 2023-07-01 NOTE — Progress Notes (Signed)
Speech Language Pathology Daily Session Note  Patient Details  Name: Zachary Winters MRN: [id number] Date of Birth: 02/25/1981  Today's Date: 07/01/2023 SLP Individual Time: 1101-1200 SLP Individual Time Calculation (min): 59 min  Short Term Goals: Week 1: SLP Short Term Goal 1 (Week 1): pt will participate in repeatt MBSS to determine diet advancement as indicated. SLP Short Term Goal 2 (Week 1): Pt will tolerte Dys 2 trials with no overt or subtle s/s of aspiration. SLP Short Term Goal 3 (Week 1): Pt will utilize compensatory swallow strategies to reduced or clear oral residue as needed with min assistance. SLP Short Term Goal 4 (Week 1): Pt will repeat functional words with >50% intelligiblity given direct verbal model. SLP Short Term Goal 5 (Week 1): Pt will follow 1-step commands with >75% accuracy given min cues. SLP Short Term Goal 6 (Week 1): Pt will answer basic Y/N questions with >90% accuracy given min cues.  Skilled Therapeutic Interventions:  Pt was seen in am to address aphasia and dysphagia management. Pt was alert and seated upright in TIS WC upon SLP arrival. He appeared restless and endorsed pain when asked however; he was unable to communicate locale. He was agreeable for speech therapy session. SLP challenged pt in answering yes/ no biographical and personal questions. Pt accurately answered close ended questions with 89% acc with sup to min A. He identified objects from a FO2 with 100% acc with min A. He was challenged to repeat functional words which included; pain, bed, head, yes, no, and Bright. Pt presented with significant initial consonant deletion. Pt improved accuracy of initial consonant production in words given max verbal, visual and some tactile cues with 33% acc. Intermittently throughout session, pt demo strong coughing likely 2/2 poor secretion management. Pt initially declined oral hygiene but receptive post explanation. SLP provided thorough oral hygiene utilizing  suction tooth brush. Noted white coating on tongue during oral hygiene. NSG notified about possible thrush. Intermittent verbal cues throughout session to initiate swallow with limited return. When asked later in session about pain, pt denied any pain. Direct handoff to NT and nurse for nursing care. SLP to continue POC.   Pain Pain Assessment Pain Scale: 0-10 Pain Score: 0-No pain  Therapy/Group: Individual Therapy  Renaee Munda 07/01/2023, 11:47 AM

## 2023-07-01 NOTE — Progress Notes (Signed)
Per shift report this AM, pt had incontinent episode. Occurrence was not documented. Pt bladder scanned for 357. Pushed fluids per tube. Will PVR once voided.

## 2023-07-01 NOTE — Progress Notes (Addendum)
Pt required intermittent catheterization for no void >8hours. MD kirsteins made aware   07/01/23 1304  Unmeasured Output  Urine Occurrence 0  Urine Characteristics  Urinary Interventions Intermittent/Straight cath  Intermittent/Straight Cath (mL) 550 mL  Hygiene Peri care

## 2023-07-01 NOTE — Progress Notes (Signed)
Occupational Therapy Session Note  Patient Details  Name: Zachary Winters MRN: [id number] Date of Birth: 20-Mar-1981  Today's Date: 07/01/2023 OT Individual Time: 2130-8657 OT Individual Time Calculation (min): 65 min  and Today's Date: 07/01/2023 OT Missed Time: 10 Minutes Missed Time Reason:  (Missed minuted due to delay in service.)   Short Term Goals: Week 1:  OT Short Term Goal 1 (Week 1): pt will be able to hold static sit balance with CGA to enable him to sit safely on a BSC. OT Short Term Goal 2 (Week 1): Pt will be able to sit to stand in a stedy lift with mod A to enable a safe transfer to a BSC. OT Short Term Goal 3 (Week 1): Pt will demonstrate improved R side attention by completing RUE self ROM to prevent contractures due to hypertone, OT Short Term Goal 4 (Week 1): Pt will sit to stand with max A to enable care givers to pull clothing up over hips.  Skilled Therapeutic Interventions/Progress Updates:  Pt received resting in bed for skilled OT session with focus on BADL participation, functional transfers, R-sided awareness, and attention to task. Pt agreeable to interventions, despite demonstrating flat mood/affect. Pt with no expression of pain. OT offering intermediate rest breaks and positioning suggestions throughout session to address potential pain/fatigue and maximize participation/safety in session.   Pt requires Max A to don clean pants, able to maneuver LLE into pant sleeve with no cuing and rolling towards L-side to assist with donning over hips/bottom. Pt requires heavy mod to roll towards R-side of bed to complete donning. Pt comes to EOB through log roll technique with Max A for BLE management and trunk elevation. Pt requires Min-Mod A to maintain sitting balance EOB, donning t-shirt with Max A for hemi-side management and bringing material over his head. Pt performs EOB>TIS WC transfer with use of stedy and +2 assistance for stedy/line management, pt with strong R-lean,  pushing with L-hand on stedy bar, unable to re-direct during transfer.   Pt dependent for transport to day room. In day room, pt participates in visual scanning activity. Using colored dots and cones, pt tasked with retrieving cones from his R-visual field, and place them onto corresponding colored dot. Pt unable to attend to task when all dots/cones placed infront of him, observed perseverating on flipping the dots onto their numbered side and back. Activity downgraded to one colored dot/cone at a time, pt with increased success completing x3 rounds of activity. Pt requires max cuing to turn his head to the right side, unable to do so every time or track with his eyes, at times observed to be feeling for item or possibly using his peripheral vision as he had some overshooting.   Back in patient room, oral care attempted but patient refusing after a couple of seconds. Pt requires min cuing to initiate washing his face with a washcloth, but did need assistance for thoroughness, observed to be using washcloth to clean his mouth despite only being cued to clean his face.   Pt with some aspiration noted during session, possibly withholding saliva, SLP made aware.   Pt remained sitting in TIS with all immediate needs met at end of session. Pt continues to be appropriate for skilled OT intervention to promote further functional independence.   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, honey thick liquids only Restrictions Weight Bearing Restrictions: No   Therapy/Group: Individual Therapy  Levada Schilling  [place], OTR/L, MSOT  07/01/2023, 6:43 AM

## 2023-07-02 LAB — GLUCOSE, CAPILLARY
Glucose-Capillary: 212 mg/dL — ABNORMAL HIGH (ref 70–99)
Glucose-Capillary: 238 mg/dL — ABNORMAL HIGH (ref 70–99)
Glucose-Capillary: 293 mg/dL — ABNORMAL HIGH (ref 70–99)
Glucose-Capillary: 297 mg/dL — ABNORMAL HIGH (ref 70–99)
Glucose-Capillary: 316 mg/dL — ABNORMAL HIGH (ref 70–99)
Glucose-Capillary: 331 mg/dL — ABNORMAL HIGH (ref 70–99)

## 2023-07-02 LAB — PHOSPHORUS: Phosphorus: 4 mg/dL (ref 2.5–4.6)

## 2023-07-02 LAB — MAGNESIUM: Magnesium: 2.1 mg/dL (ref 1.7–2.4)

## 2023-07-02 LAB — OCCULT BLOOD X 1 CARD TO LAB, STOOL: Fecal Occult Bld: NEGATIVE

## 2023-07-02 MED ORDER — INSULIN ASPART 100 UNIT/ML IJ SOLN
0.0000 [IU] | INTRAMUSCULAR | Status: DC
  Administered 2023-07-02 (×2): 5 [IU] via SUBCUTANEOUS
  Administered 2023-07-02: 8 [IU] via SUBCUTANEOUS
  Administered 2023-07-03: 3 [IU] via SUBCUTANEOUS
  Administered 2023-07-03 (×2): 2 [IU] via SUBCUTANEOUS
  Administered 2023-07-03: 3 [IU] via SUBCUTANEOUS
  Administered 2023-07-03: 5 [IU] via SUBCUTANEOUS

## 2023-07-02 MED ORDER — INSULIN GLARGINE-YFGN 100 UNIT/ML ~~LOC~~ SOLN
15.0000 [IU] | Freq: Every day | SUBCUTANEOUS | Status: DC
  Administered 2023-07-02 – 2023-07-06 (×5): 15 [IU] via SUBCUTANEOUS
  Filled 2023-07-02 (×6): qty 0.15

## 2023-07-02 MED ORDER — TIZANIDINE HCL 4 MG PO TABS
4.0000 mg | ORAL_TABLET | Freq: Three times a day (TID) | ORAL | Status: DC
  Administered 2023-07-02 – 2023-07-25 (×64): 4 mg via ORAL
  Filled 2023-07-02 (×65): qty 1

## 2023-07-02 NOTE — Progress Notes (Signed)
PROGRESS NOTE   Subjective/Complaints:  Cortrak feeding causing elevated CBG  ROS- aphasia   Objective:   DG Abd Portable 1V  Result Date: 06/30/2023 CLINICAL DATA:  Evaluate feeding tube placement. EXAM: PORTABLE ABDOMEN - 1 VIEW COMPARISON:  06/27/2023 FINDINGS: The feeding tube tip is below the GE junction in the expected location of the distal body of stomach. Enteric contrast material is again seen within the colon. No dilated bowel loops. IMPRESSION: Feeding tube tip in the distal body of the stomach. Electronically Signed   By: Signa Kell M.D.   On: 06/30/2023 11:04   No results for input(s): "WBC", "HGB", "HCT", "PLT" in the last 72 hours.  No results for input(s): "NA", "K", "CL", "CO2", "GLUCOSE", "BUN", "CREATININE", "CALCIUM" in the last 72 hours.   Intake/Output Summary (Last 24 hours) at 07/02/2023 0833 Last data filed at 07/01/2023 1806 Gross per 24 hour  Intake 318 ml  Output 550 ml  Net -232 ml         Physical Exam: Vital Signs Blood pressure (!) 131/90, pulse (!) 104, temperature 99 F (37.2 C), resp. rate 18, height 6\' 1"  (1.854 m), weight 81.3 kg, SpO2 100 %. Aphasic will give thumbs up but inconsistent with Y/N head nods  Also has receptive language issues, when asked to raise LLE he raises LUE  General: No acute distress Mood and affect are appropriate Heart: tachycardia Regular rhythm no rubs murmurs or extra sounds Lungs: Clear to auscultation, breathing unlabored, no rales or wheezes Abdomen: Positive bowel sounds, soft nontender to palpation, nondistended Extremities: No clubbing, cyanosis, or edema Skin: No evidence of breakdown, no evidence of rash Neurologic: Cranial nerves II through XII intact, motor strength is 5/5 in left and 0/5 right  deltoid, bicep, tricep, grip, hip flexor, knee extensors, ankle dorsiflexor and plantar flexor RUE extension with yawn  Flexor withdrawal spasticity  severe RLE, MAS 3 in Left elbow flexors  Sensory exam reduced pinch sensation on RIght   Musculoskeletal: Full range of motion in all 4 extremities. No joint swelling    Assessment/Plan: 1. Functional deficits which require 3+ hours per day of interdisciplinary therapy in a comprehensive inpatient rehab setting. Physiatrist is providing close team supervision and 24 hour management of active medical problems listed below. Physiatrist and rehab team continue to assess barriers to discharge/monitor patient progress toward functional and medical goals  Care Tool:  Bathing        Body parts bathed by helper: Right arm, Left arm, Chest, Abdomen, Front perineal area, Buttocks, Right upper leg, Left upper leg, Face, Left lower leg, Right lower leg     Bathing assist Assist Level: Dependent - Patient 0%     Upper Body Dressing/Undressing Upper body dressing   What is the patient wearing?: Pull over shirt    Upper body assist Assist Level: Total Assistance - Patient < 25%    Lower Body Dressing/Undressing Lower body dressing      What is the patient wearing?: Underwear/pull up, Incontinence brief     Lower body assist Assist for lower body dressing: Dependent - Patient 0%     Toileting Toileting    Toileting assist Assist for  toileting: Dependent - Patient 0%     Transfers Chair/bed transfer  Transfers assist  Chair/bed transfer activity did not occur: Safety/medical concerns  Chair/bed transfer assist level: Dependent - mechanical lift (STEDY)     Locomotion Ambulation   Ambulation assist      Assist level: Maximal Assistance - Patient 25 - 49% Assistive device: Other (comment) (L hallway handrail) Max distance: 35 ft   Walk 10 feet activity   Assist     Assist level: Maximal Assistance - Patient 25 - 49% Assistive device: Other (comment) (L hallway hand rail)   Walk 50 feet activity   Assist Walk 50 feet with 2 turns activity did not occur:  Safety/medical concerns         Walk 150 feet activity   Assist Walk 150 feet activity did not occur: Safety/medical concerns         Walk 10 feet on uneven surface  activity   Assist Walk 10 feet on uneven surfaces activity did not occur: Safety/medical concerns         Wheelchair     Assist Is the patient using a wheelchair?: Yes Type of Wheelchair: Manual    Wheelchair assist level: Dependent - Patient 0% Max wheelchair distance: 150 ft    Wheelchair 50 feet with 2 turns activity    Assist        Assist Level: Dependent - Patient 0%   Wheelchair 150 feet activity     Assist      Assist Level: Dependent - Patient 0%   Blood pressure (!) 131/90, pulse (!) 104, temperature 99 F (37.2 C), resp. rate 18, height 6\' 1"  (1.854 m), weight 81.3 kg, SpO2 100 %.  Medical Problem List and Plan: 1. 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  Antithrombotics: -DVT/anticoagulation:  Pharmaceutical: Lovenox, LE doppler neg              -antiplatelet therapy: DAPT for at leasat  3 months. Has declined stenting.   3. Pain Management: Tylenol prn.  4. Mood/Behavior/Sleep: LCSW to follow for evaluation and support when appropriate.              -antipsychotic agents: N/A 5. Neuropsych/cognition: This patient is not capable of making decisions on his own behalf. 6. Skin/Wound Care: Routine pressure relief measures.  7. Fluids/Electrolytes/Nutrition: Strict I/O-inadequate nutrition RD to place cortrak,  eating only 1 meal per day with 25-30% of meal  8. 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 HTN: Monitor BP TID--long term goal normotensive             --continue  amlodipine 10 mg/day and coreg  6.25 mg BID.  Vitals:   07/01/23 2008 07/02/23 0426  BP: 127/88 (!) 131/90  Pulse: (!) 103 (!) 104  Resp: 16 18  Temp: 99.2 F (37.3 C) 99 F (37.2 C)  SpO2: 98% 100%   Keep in 140-180 range due to severe intracranial stenosis -slightly below target BP reduce  will  increase coreg and reduce amlodipine  10. T2DM w/peripheral neuropathy: Hgb A1c- 11.7. Was on Lantus 30 units  PTA?  --Continue Insulin glargline-->was decreased to  5 units/HS today(to start tomorrow as received 10 units this am).   --Hold every 4 hours novolog as not on TF at this moment.  CBG (last 3)  Recent Labs    07/02/23 0002 07/02/23 0415 07/02/23 0811  GLUCAP 293* 331* 316*    Increased Semglee to 15U 7/7 Change SSI to moderate  11. Acute on chronic renal failure?:  Improved with IVF for hydration-->was d/c yesterday but will resume with questionable intake --.SCr 1.02 in 09/22 per chart review. SCr 1.57 @ admission-->1.29-->1.31  PO4 and Mg ok 7/7 12. Severe dysphagia: Continue D1, honey liquids. Willing to take liquids-->will offer thickened Glucerna with meals for now.         13. Hyperlipidemia: LDL 265. On Lipitor 80 mg daily. 14. Severe vitamin D deficiency: On Ergocalciferol weekly with dialy low supplement.  15. ABLA: Hgb 13.1 @ admission-->11.1-->9.2. Will order stool guaiacs- none collected 7/5.              --Senna added at bedtime as last BM 06/29.  16. Low grade fevers: Encourage pulmonary hygiene.  17. Hx tobacco/Marijuana use: Continue  nicotine patch.  Encourage cessation of Marijuana.  18. Intermittent episodes of N/V: Question due to gastroparesis (Multiple ED visits in the past for intractable N/V). 19. Nasal congestion- ordered Flonase 1 sprays daily.  20.  Developing severe spasticity RLE>RUE will increase tizanidine   LOS: 5 days A FACE TO FACE EVALUATION WAS PERFORMED  Erick Colace 07/02/2023, 8:33 AM

## 2023-07-02 NOTE — Progress Notes (Signed)
Pt threw up cortak. Lung sounds clear bilaterally. Pt coughing subsided. MD Kirsteins made aware. NPO status ordered. Mylo Red, LPN

## 2023-07-03 ENCOUNTER — Encounter (HOSPITAL_COMMUNITY): Payer: Self-pay | Admitting: Physical Medicine & Rehabilitation

## 2023-07-03 ENCOUNTER — Inpatient Hospital Stay (HOSPITAL_COMMUNITY): Payer: Medicaid Other

## 2023-07-03 DIAGNOSIS — G8111 Spastic hemiplegia affecting right dominant side: Secondary | ICD-10-CM

## 2023-07-03 DIAGNOSIS — I69391 Dysphagia following cerebral infarction: Secondary | ICD-10-CM

## 2023-07-03 DIAGNOSIS — I10 Essential (primary) hypertension: Secondary | ICD-10-CM

## 2023-07-03 DIAGNOSIS — D62 Acute posthemorrhagic anemia: Secondary | ICD-10-CM

## 2023-07-03 LAB — BASIC METABOLIC PANEL
Anion gap: 10 (ref 5–15)
BUN: 19 mg/dL (ref 6–20)
CO2: 26 mmol/L (ref 22–32)
Calcium: 8.8 mg/dL — ABNORMAL LOW (ref 8.9–10.3)
Chloride: 99 mmol/L (ref 98–111)
Creatinine, Ser: 1.6 mg/dL — ABNORMAL HIGH (ref 0.61–1.24)
GFR, Estimated: 56 mL/min — ABNORMAL LOW (ref >60)
Glucose, Bld: 178 mg/dL — ABNORMAL HIGH (ref 70–99)
Potassium: 4.2 mmol/L (ref 3.5–5.1)
Sodium: 135 mmol/L (ref 135–145)

## 2023-07-03 LAB — GLUCOSE, CAPILLARY
Glucose-Capillary: 142 mg/dL — ABNORMAL HIGH (ref 70–99)
Glucose-Capillary: 143 mg/dL — ABNORMAL HIGH (ref 70–99)
Glucose-Capillary: 164 mg/dL — ABNORMAL HIGH (ref 70–99)
Glucose-Capillary: 176 mg/dL — ABNORMAL HIGH (ref 70–99)
Glucose-Capillary: 198 mg/dL — ABNORMAL HIGH (ref 70–99)
Glucose-Capillary: 229 mg/dL — ABNORMAL HIGH (ref 70–99)

## 2023-07-03 LAB — CBC
HCT: 32.4 % — ABNORMAL LOW (ref 39.0–52.0)
Hemoglobin: 11 g/dL — ABNORMAL LOW (ref 13.0–17.0)
MCH: 30.6 pg (ref 26.0–34.0)
MCHC: 34 g/dL (ref 30.0–36.0)
MCV: 90.3 fL (ref 80.0–100.0)
Platelets: 361 10*3/uL (ref 150–400)
RBC: 3.59 MIL/uL — ABNORMAL LOW (ref 4.22–5.81)
RDW: 12.9 % (ref 11.5–15.5)
WBC: 9.9 10*3/uL (ref 4.0–10.5)
nRBC: 0.2 % (ref 0.0–0.2)

## 2023-07-03 MED ORDER — INSULIN ASPART 100 UNIT/ML IJ SOLN
0.0000 [IU] | Freq: Three times a day (TID) | INTRAMUSCULAR | Status: DC
  Administered 2023-07-03: 3 [IU] via SUBCUTANEOUS
  Administered 2023-07-04: 2 [IU] via SUBCUTANEOUS
  Administered 2023-07-04 (×3): 3 [IU] via SUBCUTANEOUS
  Administered 2023-07-05: 5 [IU] via SUBCUTANEOUS
  Administered 2023-07-05 (×2): 3 [IU] via SUBCUTANEOUS
  Administered 2023-07-05 – 2023-07-06 (×2): 2 [IU] via SUBCUTANEOUS

## 2023-07-03 MED ORDER — SODIUM CHLORIDE 0.9 % IV SOLN: INTRAVENOUS | Status: DC

## 2023-07-03 NOTE — Progress Notes (Addendum)
Occupational Therapy Session Note  Patient Details  Name: Zachary Winters MRN: [id number] Date of Birth: 01/13/1981  Today's Date: 07/03/2023 OT Individual Time: 1120-1205 OT Individual Time Calculation (min): 45 min    Short Term Goals: Week 1:  OT Short Term Goal 1 (Week 1): pt will be able to hold static sit balance with CGA to enable him to sit safely on a BSC. OT Short Term Goal 2 (Week 1): Pt will be able to sit to stand in a stedy lift with mod A to enable a safe transfer to a BSC. OT Short Term Goal 3 (Week 1): Pt will demonstrate improved R side attention by completing RUE self ROM to prevent contractures due to hypertone, OT Short Term Goal 4 (Week 1): Pt will sit to stand with max A to enable care givers to pull clothing up over hips.  Skilled Therapeutic Interventions/Progress Updates:  Pt greeted supine in bed with PA and grandmother present. Pt initially declining session shaking his head "no" and placing legs back in bed anytime therapist assisted pt to EOB. Grandmother attempting to motivate pt however pt not responding, pt did become emotional when therapist showed pt a picture of his son.   Pt completed below NMR tasks to facilitate improved AROM in RUE and decrease tone. Secured RUE to un weighted dowel with pt completed x10 reps of elbow flexion/extension, chest presses and shoulder ABD,ADD.   Total A pt to EOB with pt able to sit EOB with MAX A d/t R lean, pt was able to hold onto end of bed rail with improved sitting balance but still needed overall MODA. Pt completed 2 squat pivot transfers during session with overall MAX A +2 to R and L side. Worked on sit >stands at railing with pt able to stand with MOD A +1. Once in standing pt completed x10 standing squats for NMR with mirror provided for visual feedback.   After pt completed squats pt gave therapist a "thumbs up." Pt was noted to be incontinent in gym, transported pt back to room with pt needing total A from bed  level for pericare and brief change.  Ended session with pt supine in bed with all needs within reach and bed alarm activated.                    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, honey thick liquids only Restrictions Weight Bearing Restrictions: No   Pain: no indications of pain noted.     Therapy/Group: Individual Therapy  Pollyann Glen [facility] 07/03/2023, 12:18 PM

## 2023-07-03 NOTE — Progress Notes (Signed)
Physical Therapy Session Note  Patient Details  Name: Zachary Winters MRN: [id number] Date of Birth: Jun 06, 1981  Today's Date: 07/03/2023 PT Individual Time: 0902-1005 PT Individual Time Calculation (min): 63 min   Short Term Goals: Week 1:  PT Short Term Goal 1 (Week 1): Pt will perform bed mobility at overall ModA. PT Short Term Goal 2 (Week 1): Pt will perform sit<>stand with overall maxA +1 PT Short Term Goal 3 (Week 1): pt will maintain sitting balance to midline with modA +1 for at least . PT Short Term Goal 4 (Week 1): pt will perform squat pivot transfer with MaxA +1 PT Short Term Goal 5 (Week 1): Pt will complete PASS for outcome measure. Week 2:     Skilled Therapeutic Interventions/Progress Updates:      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, honey thick liquids only Restrictions Weight Bearing Restrictions: No General:   Vital Signs:  Pain:    Therapy/Group: Individual Therapy  Loel Dubonnet PT, DPT, CSRS 07/03/2023, 8:02 AM

## 2023-07-03 NOTE — Progress Notes (Signed)
PROGRESS NOTE   Subjective/Complaints:  Pt apparently through up last night and cortrak came along with it. No issues overnight. Was made NPO.  Pt comfortable this morning. Diet resumed. Pt indicates he's hungry.   ROS: limited due to language/communication   Objective:   No results found. Recent Labs    07/03/23 0923  WBC 9.9  HGB 11.0*  HCT 32.4*  PLT 361   Recent Labs    07/03/23 0923  NA 135  K 4.2  CL 99  CO2 26  GLUCOSE 178*  BUN 19  CREATININE 1.60*  CALCIUM 8.8*    Intake/Output Summary (Last 24 hours) at 07/03/2023 1100 Last data filed at 07/02/2023 1234 Gross per 24 hour  Intake 236 ml  Output --  Net 236 ml        Physical Exam: Vital Signs Blood pressure (!) 140/98, pulse (!) 108, temperature 98.9 F (37.2 C), resp. rate 17, height 6\' 1"  (1.854 m), weight 81.3 kg, SpO2 100 %. Constitutional: No distress . Vital signs reviewed. HEENT: NCAT, EOMI, oral membranes moist Neck: supple Cardiovascular: tachy without murmur. No JVD    Respiratory/Chest: CTA Bilaterally without wheezes or rales. Normal effort    GI/Abdomen: BS +, non-tender, non-distended Ext: no clubbing, cyanosis, or edema Psych: pleasant and cooperative  Skin: No evidence of breakdown, no evidence of rash Neurologic: Aphasic but communicates with head nods, y/n. Cranial nerves II through XII intact, motor strength is 5/5 in left and 0/5 right  deltoid, bicep, tricep, grip, hip flexor, knee extensors, ankle dorsiflexor and plantar flexor RUE extension with yawn  Flexor withdrawal spasticity remains severe RLE, MAS 3 in Left elbow flexors Sensory exam reduced pinch sensation on RIght   Musculoskeletal: Full range of motion in all 4 extremities. No joint swelling    Assessment/Plan: 1. Functional deficits which require 3+ hours per day of interdisciplinary therapy in a comprehensive inpatient rehab setting. Physiatrist is  providing close team supervision and 24 hour management of active medical problems listed below. Physiatrist and rehab team continue to assess barriers to discharge/monitor patient progress toward functional and medical goals  Care Tool:  Bathing        Body parts bathed by helper: Right arm, Left arm, Chest, Abdomen, Front perineal area, Buttocks, Right upper leg, Left upper leg, Face, Left lower leg, Right lower leg     Bathing assist Assist Level: Dependent - Patient 0%     Upper Body Dressing/Undressing Upper body dressing   What is the patient wearing?: Pull over shirt    Upper body assist Assist Level: Total Assistance - Patient < 25%    Lower Body Dressing/Undressing Lower body dressing      What is the patient wearing?: Underwear/pull up, Incontinence brief     Lower body assist Assist for lower body dressing: Dependent - Patient 0%     Toileting Toileting    Toileting assist Assist for toileting: Dependent - Patient 0%     Transfers Chair/bed transfer  Transfers assist  Chair/bed transfer activity did not occur: Safety/medical concerns  Chair/bed transfer assist level: Dependent - mechanical lift (STEDY)     Locomotion Ambulation   Ambulation assist  Assist level: Maximal Assistance - Patient 25 - 49% Assistive device: Other (comment) (L hallway handrail) Max distance: 35 ft   Walk 10 feet activity   Assist     Assist level: Maximal Assistance - Patient 25 - 49% Assistive device: Other (comment) (L hallway hand rail)   Walk 50 feet activity   Assist Walk 50 feet with 2 turns activity did not occur: Safety/medical concerns         Walk 150 feet activity   Assist Walk 150 feet activity did not occur: Safety/medical concerns         Walk 10 feet on uneven surface  activity   Assist Walk 10 feet on uneven surfaces activity did not occur: Safety/medical concerns         Wheelchair     Assist Is the patient  using a wheelchair?: Yes Type of Wheelchair: Manual    Wheelchair assist level: Dependent - Patient 0% Max wheelchair distance: 150 ft    Wheelchair 50 feet with 2 turns activity    Assist        Assist Level: Dependent - Patient 0%   Wheelchair 150 feet activity     Assist      Assist Level: Dependent - Patient 0%   Blood pressure (!) 140/98, pulse (!) 108, temperature 98.9 F (37.2 C), resp. rate 17, height 6\' 1"  (1.854 m), weight 81.3 kg, SpO2 100 %.  Medical Problem List and Plan: 1. 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  Antithrombotics: -DVT/anticoagulation:  Pharmaceutical: Lovenox, LE doppler neg              -antiplatelet therapy: DAPT for at leasat  3 months. Has declined stenting.   3. Pain Management: Tylenol prn.  4. Mood/Behavior/Sleep: LCSW to follow for evaluation and support when appropriate.              -antipsychotic agents: N/A 5. Neuropsych/cognition: This patient is not capable of making decisions on his own behalf. 6. Skin/Wound Care: Routine pressure relief measures.  7. Fluids/Electrolytes/Nutrition:   7/8-cortrak came out last night. Indicated he was hungry  --will push po again today,   -resumed diet  -add IVF for hydration  -may need to reinsert Cortrak, but will give him today to see what he does 8. 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 HTN: Monitor BP TID--long term goal normotensive             --continue amlodipine 10 mg/day and coreg  6.25 mg BID.  Vitals:   07/02/23 1935 07/03/23 0411  BP: (!) 118/97 (!) 140/98  Pulse: 98 (!) 108  Resp: 17 17  Temp: 98.6 F (37 C) 98.9 F (37.2 C)  SpO2: 99% 100%    Keep in 140-180 range due to severe intracranial stenosis -slightly below target BP reduce  will  increase coreg and reduce amlodipine 7/8 in range  10. T2DM w/peripheral neuropathy: Hgb A1c- 11.7. Was on Lantus 30 units PTA?  --Continue Insulin glargline-->was decreased to  5 units/HS today(to start tomorrow as received 10 units  this am).   --Hold every 4 hours novolog as not on TF at this moment.  CBG (last 3)  Recent Labs    07/03/23 0000 07/03/23 0408 07/03/23 0807  GLUCAP 164* 143* 142*   Increased Semglee to 15U 7/7 Change SSI to moderate  7/8 fair control 11. Acute on chronic renal failure?:  Improved with IVF for hydration-->was d/c yesterday but will resume with questionable intake --.SCr 1.02 in 09/22 per chart review. SCr 1.57 @ admission-->1.29-->1.31  PO4 and Mg ok 7/7 7/8 Cr sl elevated close to admission level, IVF as above-recheck later this week. 12. Severe dysphagia: Continue D1, honey liquids. Willing to take liquids-->will offer thickened Glucerna with meals for now.         13. Hyperlipidemia: LDL 265. On Lipitor 80 mg daily. 14. Severe vitamin D deficiency: On Ergocalciferol weekly with dialy low supplement.  15. ABLA: Hgb 13.1 @ admission-->11.1-->9.2. Will order stool guaiacs- none collected 7/5.              --Senna added at bedtime as last BM 06/29.   7/8-stool guaiac negative 7/7     -hgb up to 11.0 16. Low grade fevers: Encourage pulmonary hygiene.  17. Hx tobacco/Marijuana use: Continue  nicotine patch.  Encourage cessation of Marijuana.  18. Intermittent episodes of N/V: Question due to gastroparesis (Multiple ED visits in the past for intractable N/V). 19. Nasal congestion- ordered Flonase 1 sprays daily.  20.  Developing severe spasticity RLE>RUE   7/8-tizanidine just increased 7/7--obsv today  LOS: 6 days A FACE TO FACE EVALUATION WAS PERFORMED  Ranelle Oyster 07/03/2023, 11:00 AM

## 2023-07-03 NOTE — Progress Notes (Signed)
Speech Language Pathology Daily Session Note  Patient Details  Name: Zachary Winters MRN: [id number] Date of Birth: [birth year]  Today's Date: 07/03/2023 SLP Individual Time: 0101-0200 SLP Individual Time Calculation (min): 59 min  Short Term Goals: Week 1: SLP Short Term Goal 1 (Week 1): pt will participate in repeatt MBSS to determine diet advancement as indicated. SLP Short Term Goal 2 (Week 1): Pt will tolerte Dys 2 trials with no overt or subtle s/s of aspiration. SLP Short Term Goal 3 (Week 1): Pt will utilize compensatory swallow strategies to reduced or clear oral residue as needed with min assistance. SLP Short Term Goal 4 (Week 1): Pt will repeat functional words with >50% intelligiblity given direct verbal model. SLP Short Term Goal 5 (Week 1): Pt will follow 1-step commands with >75% accuracy given min cues. SLP Short Term Goal 6 (Week 1): Pt will answer basic Y/N questions with >90% accuracy given min cues.  Skilled Therapeutic Interventions:  Pt was seen in PM to address dysphagia management, motor speech disorder, and expressive and receptive language. Pt was easily alerted upon SLP arrival. Grandmother present and participating intermittently throughout session. Pt initially refusing oral hygiene requiring max verbal cues and encouragement for completion. Pt allowed this SLP to complete thorough oral hygiene with continued thick white substance on tongue. Pt being treated for thrush. He was agreeable to trials of D1 and HTL. He administered trials of D1 with min anterior spillage to R side, bolus holding, and piece meal swallow. Pt responsive to verbal cues to initiate swallow with good oral clearance. He consumed HTL via cup edge impulsively, taking large sips at a time with anterior spillage and multiple swallows for oral clearance. Intermittently throughout session, pt required cues for swallowing to manage secretions or suction. X1 s/sx pen/asp likely due to decreased secretion  management. Pt answered biographical and basic yes/no questions with 80% acc throughout session. He approximated yes/ no with mod consistent verbal cues for verbalizations. He repeated functional words with: [name], pain, [name], yes/ no with continued initial consonant deletion and groping with ~40-50% intelligibility. Education to pt and grandmother on good oral hygiene due to continued s/sx pen/asp due to poor secretion management.  Pain Pain Assessment Pain Scale: 0-10 Pain Score: 0-No pain  Therapy/Group: Individual Therapy  Renaee Munda 07/03/2023, 1:59 PM

## 2023-07-03 NOTE — Progress Notes (Signed)
Physical Therapy Session Note  Patient Details  Name: Zachary Winters MRN: [id number] Date of Birth: 21-Apr-1981  Today's Date: 07/03/2023 PT Missed Time: 45 Minutes Missed Time Reason: Patient fatigue  Short Term Goals: Week 1:  PT Short Term Goal 1 (Week 1): Pt will perform bed mobility at overall ModA. PT Short Term Goal 2 (Week 1): Pt will perform sit<>stand with overall maxA +1 PT Short Term Goal 3 (Week 1): pt will maintain sitting balance to midline with modA +1 for at least . PT Short Term Goal 4 (Week 1): pt will perform squat pivot transfer with MaxA +1 PT Short Term Goal 5 (Week 1): Pt will complete PASS for outcome measure.  Skilled Therapeutic Interventions/Progress Updates:   Pt supine in bed an asleep on entrance to room. Alarm on IV for partially occluded line. RN brought in to restart fluids after finding bend in line.   Pt asleep and does not wake despite attempts. Pt missed 45 min of skilled therapy due to fatigue. Will re-attempt as schedule and pt availability permits.  Pt's RUE placed in extension with pillow prop for improved positioning.    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   Therapy/Group: Individual Therapy  Loel Dubonnet PT, DPT, CSRS 07/03/2023, 6:33 PM

## 2023-07-03 NOTE — Progress Notes (Signed)
Grandmother has multiple questions. She reports that he's type I diabetic, has neuropathy, retinopathy (decreased vision right eye with injections)  and has had a lot of issues with N/V and poor intake as baseline. No work up and he has not seen PCP in years. She wants to hold off on cortak as she noticed that he was "coughing a lot this weekend due to the tube"

## 2023-07-04 ENCOUNTER — Inpatient Hospital Stay (HOSPITAL_COMMUNITY): Payer: Medicaid Other

## 2023-07-04 LAB — BASIC METABOLIC PANEL
Anion gap: 8 (ref 5–15)
BUN: 17 mg/dL (ref 6–20)
CO2: 26 mmol/L (ref 22–32)
Calcium: 8.8 mg/dL — ABNORMAL LOW (ref 8.9–10.3)
Chloride: 103 mmol/L (ref 98–111)
Creatinine, Ser: 1.39 mg/dL — ABNORMAL HIGH (ref 0.61–1.24)
GFR, Estimated: >60 mL/min (ref >60)
Glucose, Bld: 167 mg/dL — ABNORMAL HIGH (ref 70–99)
Potassium: 4.2 mmol/L (ref 3.5–5.1)
Sodium: 137 mmol/L (ref 135–145)

## 2023-07-04 LAB — GLUCOSE, CAPILLARY
Glucose-Capillary: 169 mg/dL — ABNORMAL HIGH (ref 70–99)
Glucose-Capillary: 149 mg/dL — ABNORMAL HIGH (ref 70–99)
Glucose-Capillary: 161 mg/dL — ABNORMAL HIGH (ref 70–99)
Glucose-Capillary: 200 mg/dL — ABNORMAL HIGH (ref 70–99)
Glucose-Capillary: 179 mg/dL — ABNORMAL HIGH (ref 70–99)
Glucose-Capillary: 185 mg/dL — ABNORMAL HIGH (ref 70–99)

## 2023-07-04 LAB — OCCULT BLOOD X 1 CARD TO LAB, STOOL: Fecal Occult Bld: NEGATIVE

## 2023-07-04 NOTE — Progress Notes (Signed)
Physical Therapy Session Note  Patient Details  Name: Zachary Winters MRN: [id number] Date of Birth: 02-23-1981  Today's Date: 07/04/2023 PT Missed Time: 60 Minutes Missed Time Reason: Patient fatigue;Unavailable (Comment);CT/MRI (Pt late returning from [facility])  Short Term Goals: Week 1:  PT Short Term Goal 1 (Week 1): Pt will perform bed mobility at overall ModA. PT Short Term Goal 2 (Week 1): Pt will perform sit<>stand with overall maxA +1 PT Short Term Goal 3 (Week 1): pt will maintain sitting balance to midline with modA +1 for at least . PT Short Term Goal 4 (Week 1): pt will perform squat pivot transfer with MaxA +1 PT Short Term Goal 5 (Week 1): Pt will complete PASS for outcome measure.   Skilled Therapeutic Interventions/Progress Updates:  On initial entrance to room, pt not yet returned from [facility]. On arrival back to room 30 min later, pt is sound asleep and unable to wake. Grandmother in room.   Pt missed 60 min of skilled therapy due to being off unit for MBS and then for fatigue. Will re-attempt as schedule and pt availability permits.   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   Therapy/Group: Individual Therapy  Loel Dubonnet PT, DPT, CSRS 07/04/2023, 6:59 PM

## 2023-07-04 NOTE — Progress Notes (Signed)
PROGRESS NOTE   Subjective/Complaints:  Continues to have poor oral intake no more than 30% of meals but oftentimes skips meals.  No core track at this time.  He coughed it out a few days ago.  Had repeat MBS today, in p.o. recommended.  ROS: limited due to language/communication   Objective:   DG Swallowing Func-Speech Pathology  Result Date: 07/04/2023 Table formatting from the original result was not included. Modified Barium Swallow Study Patient Details Name: ZUBAYR BEDNARCZYK MRN: [id number] Date of Birth: 1981-08-24 Today's Date: 07/04/2023 HPI/PMH: HPI: Pt is a 42 year old male who presented with right-sided weakness, right-sided facial droop and difficulty speaking. 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 BSE 6/19 with minimal R anterior spillage, decreased coordination and R anterior spillage; dysphagia 2 diet with thin liquids recommended. 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 Similar petechial hemorrhage without mass occupying acute hemorrhage. New/interval small acute infarct in the right posterior limb of the internal capsule. MBS 6/24: mod/severe oral phase dysphagia and mild/mod pharyngeal phase dysphagia characterized by weak lingual movement and bolus manipulation with labial spillage, oral holding, and spillover to the pharynx. An NPO status recommended with snacks of puree and honey thick liquids. PMH: diabetes mellitus on insulin, chronic daily smoker, hypertension, history of marijuana use, diabetic peripheral neuropathy, hyperlipidemia Clinical Impression: Pt is a 42 year old male who presented with right-sided weakness, right-sided facial droop and difficulty  speaking. 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 BSE 6/19 with minimal R anterior spillage, decreased coordination and R anterior spillage; dysphagia 2 diet with thin liquids recommended. 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 Similar petechial hemorrhage without mass occupying acute hemorrhage. New/interval small acute infarct in the right posterior limb of the internal capsule. MBS 6/24: mod/severe oral phase dysphagia and mild/mod pharyngeal phase dysphagia characterized by weak lingual movement and bolus manipulation with labial spillage, oral holding, and spillover to the pharynx. An NPO status recommended with snacks of puree and honey thick liquids. PMH: diabetes mellitus on insulin, chronic daily smoker, hypertension, history of marijuana use, diabetic peripheral neuropathy, hyperlipidemia   Clinical Impression  Pt presents with a moderate oral dysphagia and a mild pharyngeal dysphagia per MBSS completed today.  Oral deficits characterized by reduced oral strength and coordination, which resulted in anterior spillage of liquids from the R side, incomplete oral transit of liquids and solids, and posterior loss of all liquids to the level of the pyriform sinuses before the swallow. Pt required frequent cues to initiate subsequent dry swallow in effort to reduce moderate amount of oral residue.  Pharyngeal deficits characterized by reduced hyolaryngeal elevation/excursion, reduced base of tongue retraction, reduced larygneal vestibule closure, reduced pharyngeal stripping, and reduced distention of  upper esophageal sphincter.  Findings -There was x1 instance of suspected aspiration of thin liquids after consecutive, large swallows that were pt  initiated by pt. Aspiration inferred due to significant coughing spell once fluoro was turned off. No e/o aspirated residue observed in the trachea once fluoro was turned back on. Suspect aspiration was either ejected or unable to be viewed on fluoro due to minimal amount. -There was inconsistent, trace, transient, shallow penetration of thin liquids by cup and straw sip before/during the swallow. -There was x1 instance of scant, transient, shallow penetration of nectar-thick liquid residue after the swallow. -Cued dry swallows and liquid washes were effective for reduced moderate oral residue and min-mod diffuse pharyngeal residue. Pt, otherwise, did not appeared aware or sensate of this residue.  Detailed diet recommendations and aspiration precautions outlined below. Pt's mother was updated with updated diet recommendations following the study.  Plan: SLP will continue to follow to assess diet tolerance and modify diet as indicated. No data recorded Factors that may increase risk of adverse event in presence of aspiration [name] & [name] 2021): Factors that may increase risk of adverse event in presence of aspiration [name] & [name] 2021): Limited mobility; Dependence for feeding and/or oral hygiene; Inadequate oral hygiene Recommendations/Plan: Swallowing Evaluation Recommendations Swallowing Evaluation Recommendations Recommendations: PO diet PO Diet Recommendation: Dysphagia 2 (Finely chopped); Thin liquids (Level 0) Liquid Administration via: Cup (10cc Provale cup) Medication Administration: Crushed with puree Supervision: Full supervision/cueing for swallowing strategies; Staff to assist with self-feeding Swallowing strategies  : Slow rate; Small bites/sips; Check for pocketing or oral holding; Check for anterior loss; Multiple dry swallows after each bite/sip; Follow solids with liquids Postural changes: Position pt fully upright for meals; Stay upright 30-60 min after meals Oral care recommendations:  Staff/trained caregiver to provide oral care; Oral care before ice chips/water Caregiver Recommendations: Have oral suction available Treatment Plan Treatment Plan Treatment recommendations: Therapy as outlined in treatment plan below Follow-up recommendations: Outpatient SLP Functional status assessment: Patient has not had a recent decline in their functional status. Treatment frequency: Min 3x/week Treatment duration: 2 weeks Interventions: Aspiration precaution training; Patient/family education; Trials of upgraded texture/liquids; Compensatory techniques; Diet toleration management by SLP Recommendations Recommendations for follow up therapy are one component of a multi-disciplinary discharge planning process, led by the attending physician.  Recommendations may be updated based on patient status, additional functional criteria and insurance authorization. Assessment: Orofacial Exam: Orofacial Exam Oral Cavity: Oral Hygiene: Lingual coating (on nystatin) Oral Cavity - Dentition: Adequate natural dentition Orofacial Anatomy: WFL Oral Motor/Sensory Function: Suspected cranial nerve impairment CN V - Trigeminal: Not tested CN VII - Facial: Right motor impairment CN IX - Glossopharyngeal, CN X - Vagus: Right motor impairment CN XII - Hypoglossal: Not tested Anatomy: No data recorded Boluses Administered: Boluses Administered Boluses Administered: Thin liquids (Level 0); Mildly thick liquids (Level 2, nectar thick); Moderately thick liquids (Level 3, honey thick); Puree; Solid  Oral Impairment Domain: Oral Impairment Domain Lip Closure: Escape progressing to mid-chin Tongue control during bolus hold: Posterior escape of greater than half of bolus Bolus preparation/mastication: Slow prolonged chewing/mashing with complete recollection Bolus transport/lingual motion: Repetitive/disorganized tongue motion Oral residue: Residue collection on oral structures Location of oral residue : Floor of mouth; Tongue Initiation of  pharyngeal swallow : Pyriform sinuses  Pharyngeal Impairment Domain: Pharyngeal Impairment Domain Soft palate elevation: No bolus between soft palate (SP)/pharyngeal wall (PW) Laryngeal elevation: Partial superior movement of thyroid cartilage/partial approximation of arytenoids to epiglottic petiole Anterior hyoid excursion:  Partial anterior movement Epiglottic movement: Complete inversion Laryngeal vestibule closure: Incomplete, narrow column air/contrast in laryngeal vestibule Pharyngeal stripping wave : Present - diminished Pharyngeal contraction (A/P view only): N/A Pharyngoesophageal segment opening: Partial distention/partial duration, partial obstruction of flow Tongue base retraction: Trace column of contrast or air between tongue base and PPW Pharyngeal residue: Collection of residue within or on pharyngeal structures Location of pharyngeal residue: Valleculae; Pyriform sinuses; Tongue base  Esophageal Impairment Domain: Esophageal Impairment Domain Esophageal clearance upright position: Complete clearance, esophageal coating Pill: No data recorded Penetration/Aspiration Scale Score: Penetration/Aspiration Scale Score 1.  Material does not enter airway: Puree; Moderately thick liquids (Level 3, honey thick); Thin liquids (Level 0); Mildly thick liquids (Level 2, nectar thick); Solid 2.  Material enters airway, remains ABOVE vocal cords then ejected out: Thin liquids (Level 0); Mildly thick liquids (Level 2, nectar thick) 3.  Material enters airway, remains ABOVE vocal cords and not ejected out: Thin liquids (Level 0) 6.  Material enters airway, passes BELOW cords then ejected out: Thin liquids (Level 0) (suspected based on cough response, though not viewed due to fluro off) Compensatory Strategies: Compensatory Strategies Compensatory strategies: Yes Multiple swallows: Effective Effective Multiple Swallows: Thin liquid (Level 0); Mildly thick liquid (Level 2, nectar thick); Moderately thick liquid (Level  3, honey thick) (reduced residue) Liquid wash: Effective Effective Liquid Wash: Puree; Solid (reduced residue)   General Information: Caregiver present: No (SLP provided education with pt's mom at bedside)  Diet Prior to this Study: Dysphagia 1 (pureed); Moderately thick liquids (Level 3, honey thick)   Temperature : Normal   Respiratory Status: WFL   Supplemental O2: None (Room air)   History of Recent Intubation: No  Behavior/Cognition: Alert; Pleasant mood; Requires cueing; Impulsive Self-Feeding Abilities: Needs assist with self-feeding Baseline vocal quality/speech: Not observed Volitional Cough: Unable to elicit Volitional Swallow: Able to elicit (delayed with multiple cues) Exam Limitations: -- (fluro camera off during possible aspiration event) Goal Planning: Prognosis for improved oropharyngeal function: Fair Barriers to Reach Goals: Language deficits; Severity of deficits No data recorded Patient/Family Stated Goal: none stated Consulted and agree with results and recommendations: Nurse; Family member/caregiver; Nurse Tech Pain: Pain Assessment Pain Assessment: No/denies pain Faces Pain Scale: 0 SLP visit diagnosis: SLP Visit Diagnosis: Dysphagia, oropharyngeal phase (R13.12); Aphasia (R47.01); Dysarthria and anarthria (R47.1) Past Medical History: Past Medical History: Diagnosis Date  Nausea and vomiting   chronic for years.  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 EXAM: CHEST - 2 VIEW COMPARISON:  June 27, 2023 FINDINGS: The heart size and mediastinal contours are within normal limits. Both lungs are clear. The visualized skeletal structures are unremarkable. IMPRESSION: No active cardiopulmonary disease. Electronically Signed   By: Aram Candela M.D.   On: 07/03/2023 20:14   Recent Labs    07/03/23 0923  WBC 9.9  HGB 11.0*  HCT 32.4*  PLT 361    Recent Labs    07/03/23 0923 07/04/23 0537  NA 135 137  K 4.2 4.2  CL 99 103  CO2 26 26  GLUCOSE 178* 167*  BUN 19 17  CREATININE 1.60* 1.39*  CALCIUM 8.8* 8.8*     Intake/Output Summary (Last 24 hours) at 07/04/2023 1242 Last data filed at 07/04/2023 0800 Gross per 24 hour  Intake 120 ml  Output --  Net 120 ml         Physical Exam: Vital Signs Blood pressure (!) 138/97, pulse 88, temperature 98.2 F (36.8 C), temperature source Oral, resp. rate 16, height 6\' 1"  (1.854 m), weight 81.3 kg, SpO2 100 %. Constitutional: No distress . Vital signs reviewed. HEENT: NCAT, EOMI, oral membranes moist Neck: supple Cardiovascular: tachy without murmur. No JVD    Respiratory/Chest: CTA Bilaterally without wheezes or rales. Normal effort    GI/Abdomen: BS +, non-tender, non-distended Ext: no clubbing, cyanosis, or edema Psych: pleasant and cooperative  Skin: No evidence of breakdown, no evidence of rash Neurologic: Aphasic but communicates with head nods, y/n. Cranial nerves II through XII intact, motor strength is 5/5 in left and 0/5 right  deltoid, bicep, tricep, grip, hip flexor, knee extensors, ankle dorsiflexor and plantar flexor RUE extension with yawn  Flexor withdrawal spasticity remains severe RLE, MAS 3 in Left elbow flexors Sensory exam reduced pinch sensation on RIght   Musculoskeletal: Full range of motion in all 4 extremities. No joint swelling    Assessment/Plan: 1. Functional deficits which require 3+ hours per day of interdisciplinary  therapy in a comprehensive inpatient rehab setting. Physiatrist is providing close team supervision and 24 hour management of active medical problems listed below. Physiatrist and rehab team continue to assess barriers to discharge/monitor patient progress toward functional and medical goals  Care Tool:  Bathing        Body parts bathed by helper: Right arm, Left arm, Chest, Abdomen, Front perineal area, Buttocks, Right upper leg, Left upper leg, Face, Left lower leg, Right lower leg     Bathing assist Assist Level: Dependent - Patient 0%     Upper Body Dressing/Undressing Upper body dressing   What is the patient wearing?: Pull over shirt    Upper body assist Assist Level: Total Assistance - Patient < 25%    Lower Body Dressing/Undressing Lower body dressing      What is the patient wearing?: Underwear/pull up, Incontinence brief     Lower body assist Assist for lower body dressing: Dependent - Patient 0%     Toileting Toileting    Toileting assist Assist for toileting: Dependent - Patient 0%     Transfers Chair/bed transfer  Transfers assist  Chair/bed transfer activity did not occur: Safety/medical concerns  Chair/bed transfer assist level: Dependent - mechanical lift (STEDY)     Locomotion Ambulation   Ambulation assist      Assist level: Maximal Assistance - Patient 25 - 49% Assistive device: Other (comment) (L hallway handrail) Max distance: 35 ft   Walk 10 feet activity   Assist     Assist level: Maximal Assistance - Patient 25 - 49% Assistive device: Other (comment) (L hallway hand rail)   Walk 50 feet activity   Assist Walk 50 feet with 2 turns activity did not occur: Safety/medical concerns         Walk 150 feet activity   Assist Walk 150 feet activity did not occur: Safety/medical concerns         Walk 10 feet on uneven surface  activity   Assist Walk 10 feet  on uneven surfaces activity did not occur: Safety/medical  concerns         Wheelchair     Assist Is the patient using a wheelchair?: Yes Type of Wheelchair: Manual    Wheelchair assist level: Dependent - Patient 0% Max wheelchair distance: 150 ft    Wheelchair 50 feet with 2 turns activity    Assist        Assist Level: Dependent - Patient 0%   Wheelchair 150 feet activity     Assist      Assist Level: Dependent - Patient 0%   Blood pressure (!) 138/97, pulse 88, temperature 98.2 F (36.8 C), temperature source Oral, resp. rate 16, height 6\' 1"  (1.854 m), weight 81.3 kg, SpO2 100 %.  Medical Problem List and Plan: 1. 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 Severe Left M1 and V4 stenosis              -patient may  shower             -ELOS/Goals: 18-22 days- min A for PT, OT and SLP   -Continue CIR therapies including PT, OT, and SLP   2.  Antithrombotics: -DVT/anticoagulation:  Pharmaceutical: Lovenox, LE doppler neg              -antiplatelet therapy: DAPT for at leasat  3 months. Has declined stenting.   3. Pain Management: Tylenol prn.  4. Mood/Behavior/Sleep: LCSW to follow   for evaluation and support when appropriate.              -antipsychotic agents: N/A 5. Neuropsych/cognition: This patient is not capable of making decisions on his own behalf. 6. Skin/Wound Care: Routine pressure relief measures.  7. Fluids/Electrolytes/Nutrition:   7/8-cortrak came out last night. Indicated he was hungry  --will push po again today,   -resumed diet  -add IVF for hydration  -may need to reinsert Cortrak, anticipate need for PEG, will need family okay for this. 8. 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 HTN: Monitor BP TID--long term goal normotensive             --continue amlodipine 10 mg/day and coreg  6.25 mg BID.  Vitals:   07/04/23 0454 07/04/23 1215   BP: 139/89 (!) 138/97  Pulse: 95 88  Resp: 16 16  Temp: 98.6 F (37 C) 98.2 F (36.8 C)  SpO2: 98% 100%   Keep in 140-180 range due to severe intracranial stenosis -slightly below target BP reduce  will  increase coreg and reduce amlodipine 7/9 in range  10. T2DM w/peripheral neuropathy: Hgb A1c- 11.7. Was on Lantus 30 units PTA?  --Continue Insulin glargline-->was decreased to  5 units/HS today(to start tomorrow as received 10 units this am).   --Hold every 4 hours novolog as not on TF at this moment.  CBG (last 3)  Recent Labs    07/04/23 0606 07/04/23 0750 07/04/23 1123  GLUCAP 149* 161* 200*    Increased Semglee to 15U 7/7 Change SSI to moderate  7/8 fair control 11. Acute on chronic renal failure?:  Improved with IVF for hydration-->was d/c yesterday but will resume with questionable intake --.SCr 1.02 in 09/22 per chart review. SCr 1.57 @ admission-->1.29-->1.31  PO4 and Mg ok 7/7 7/8 Cr sl elevated close to admission level, IVF as above-recheck later this week. 12. Severe dysphagia: Continue D1, honey liquids. Willing to  take liquids-->will offer thickened Glucerna with meals for now.         13. Hyperlipidemia: LDL 265. On Lipitor 80 mg daily. 14. Severe vitamin D deficiency: On Ergocalciferol weekly with dialy low supplement.  15. ABLA: Hgb 13.1 @ admission-->11.1-->9.2. Will order stool guaiacs- none collected 7/5.              --Senna added at bedtime as last BM 06/29.   7/8-stool guaiac negative 7/7     -hgb up to 11.0 16. Low grade fevers: Encourage pulmonary hygiene.  17. Hx tobacco/Marijuana use: Continue  nicotine patch.  Encourage cessation of Marijuana.  18. Intermittent episodes of N/V: Question due to gastroparesis (Multiple ED visits in the past for intractable N/V). 19. Nasal congestion- ordered Flonase 1 sprays daily.  20.  Developing severe spasticity RLE>RUE   7/8-tizanidine just increased 7/7--obsv today 21.  Post stroke dysphagia, most recent  MBS showed no improvement.  Will continue n.p.o. status and have dietary reinsert feeding tube.  We will consult interventional radiology to assess for percutaneous gastrostomy tube placement I discussed this over the phone with the patient's sister Cameron Proud [phone].  She is in favor of placement.  I discussed that IR will be reaching out to her for a consent.  She states that she would be the responsible family member. LOS: 7 days A FACE TO FACE EVALUATION WAS PERFORMED  Erick Colace 07/04/2023, 12:42 PM

## 2023-07-04 NOTE — Progress Notes (Signed)
Patient ID: Zachary Winters, male   DOB: 05-Sep-1981, 42 y.o.   MRN: [id number]  Grandmother in pt's room waiting for patient to return.

## 2023-07-04 NOTE — Progress Notes (Signed)
Physical Therapy Session Note  Patient Details  Name: Zachary Winters MRN: [id number] Date of Birth: [birth year]  Today's Date: 07/04/2023 PT Individual Time: 1350-1438 PT Individual Time Calculation (min): 48 min   Short Term Goals: Week 1:  PT Short Term Goal 1 (Week 1): Pt will perform bed mobility at overall ModA. PT Short Term Goal 2 (Week 1): Pt will perform sit<>stand with overall maxA +1 PT Short Term Goal 3 (Week 1): pt will maintain sitting balance to midline with modA +1 for at least . PT Short Term Goal 4 (Week 1): pt will perform squat pivot transfer with MaxA +1 PT Short Term Goal 5 (Week 1): Pt will complete PASS for outcome measure.  Skilled Therapeutic Interventions/Progress Updates: Patient supine in bed with grandmother present on entrance to room. Patient alert and agreeable to PT session.   Patient gave no indications of pain at beginning of session.   Therapeutic Activity: Bed Mobility: Pt totalA to thread R pant leg, and L leg up to mid lower leg with instructional cues for pt to complete donning of hospital pants (required minA to fully don around waist). Pt then performed supine to sit on EOB with modA to pull self over to R sidelying via L UE on R HOB rail. Pt then cued to use B UE's to elevate trunk by pushing with L UE (pt initially tried pulling rail vs pushing into bed as instructed throughout multimodal cues - minimally distractive environment - pt shortly pushed self up with min/modA). Pt maxA to maintain sitting balance (rehab tech assisted with B hands on shoulders) in order for PTA to don personal socks and shoes with maxA (cues to push into shoe with PTA approximating R LE at knee).  Pt sitting EOB to supine with min/modA (totalA to elevate R LE onto bed), and with cues provided to use L UE on R HOB rail for leverage to elevate LE's. Pt maxA to scoot to [facility] with cues to use L LE in pushing into bed (PTA and rehab tech use of chuck pad to pull pt towards  [facility]). Transfers: Pt performed squat pivot transfers throughout session with modA to the L from EOB to TIS (VC required for pt to use L UE to assist by pulling on far TIS arm rest, and to forward lean over PTA L shoulder to promote head/hip relationship), and maxA from TIS to EOB at end of session (VC to push on arm rest with L UE and to lean on PTA R shoulder for head/hip relationship). Max/totalA to adjust B LE's accordingly.   Gait Training:  Pt ambulated 30' x 3 in hallway outside of main gym using LHR. Pt maxA + 2 (WC follow) and PTA on pt's R side to facilitate R LE advancement (totalA - cues to think about movement throughout gait cycle in order to promote neuromuscular connection), and maxA to maintain standing balance. Pt instructed to keep L UE close to body as to promote upright posture per forward flexed presentation. Pt with excessive R LE external rotation (able to better control placement after donning leg lift during 3rd trial). Pt presented with R LE flexor tone that decreased as gait trials progressed. Mirror placement at end of gait path with cues for pt to look ahead (to promote upright posture and increase pt confidence). Pt with wide BOS as L LE favors slight abducted stance.   Patient supine in bed at end of session with brakes locked, bed alarm set, and all  needs within reach.      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, honey thick liquids only Restrictions Weight Bearing Restrictions: No   Therapy/Group: Individual Therapy  Sherryann Frese PTA 07/04/2023, 4:08 PM

## 2023-07-04 NOTE — Procedures (Signed)
Modified Barium Swallow Study  Patient Details  Name: Zachary Winters MRN: [id number] Date of Birth: 1981/01/06  Today's Date: 07/04/2023  Modified Barium Swallow completed.  Full report located under Chart Review in the Imaging Section.  History of Present Illness Pt is a 42 year old male who presented with right-sided weakness, right-sided facial droop and difficulty speaking. 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 BSE 6/19 with minimal R anterior spillage, decreased coordination and R anterior spillage; dysphagia 2 diet with thin liquids recommended. 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 Similar petechial hemorrhage without mass occupying acute hemorrhage. New/interval small acute infarct in the right posterior limb of the internal capsule. MBS 6/24: mod/severe oral phase dysphagia and mild/mod pharyngeal phase dysphagia characterized by weak lingual movement and bolus manipulation with labial spillage, oral holding, and spillover to the pharynx. An NPO status recommended with snacks of puree and honey thick liquids. PMH: diabetes mellitus on insulin, chronic daily smoker, hypertension, history of marijuana use, diabetic peripheral neuropathy, hyperlipidemia   Clinical Impression  Pt presents with a moderate oral dysphagia and a mild pharyngeal dysphagia per MBSS completed today.   Oral deficits characterized by reduced oral strength and coordination, which resulted in anterior spillage of liquids from the R side, incomplete oral transit of liquids and solids, and posterior loss of all liquids to the level of the pyriform sinuses before the swallow. Pt required frequent cues to initiate subsequent dry swallow in effort to reduce  moderate amount of oral residue.   Pharyngeal deficits characterized by reduced hyolaryngeal elevation/excursion, reduced base of tongue retraction, reduced larygneal vestibule closure, reduced pharyngeal stripping, and reduced distention of upper esophageal sphincter.   Findings -There was x1 instance of suspected aspiration of thin liquids after consecutive, large swallows that were pt initiated by pt. Aspiration inferred due to significant coughing spell once fluoro was turned off. No e/o aspirated residue observed in the trachea once fluoro was turned back on. Suspect aspiration was either ejected or unable to be viewed on fluoro due to minimal amount.  -There was inconsistent, trace, transient, shallow penetration of thin liquids by cup and straw sip before/during the swallow. -There was x1 instance of scant, transient, shallow penetration of nectar-thick liquid residue after the swallow.  -Cued dry swallows and liquid washes were effective for reduced moderate oral residue and min-mod diffuse pharyngeal residue. Pt, otherwise, did not appeared aware or sensate of this residue.   Detailed diet recommendations and aspiration precautions outlined below. Pt's mother was updated with updated diet recommendations following the study.   Plan: SLP will continue to follow to assess diet tolerance and modify diet as indicated.   Factors that may increase risk of adverse event in presence of aspiration Zachary Winters & Zachary Winters 2021): Limited mobility;Dependence for feeding and/or oral hygiene;Inadequate oral hygiene  Swallow Evaluation Recommendations Recommendations: PO diet PO Diet Recommendation: Dysphagia 2 (Finely chopped);Thin liquids (Level 0) Liquid Administration via: Cup (10cc Provale cup) Medication Administration: Crushed with puree Supervision: Full supervision/cueing for swallowing strategies;Staff to assist with self-feeding Swallowing strategies  : Slow rate;Small bites/sips;Check for  pocketing or oral holding;Check for anterior loss;Multiple dry swallows after each bite/sip;Follow solids with liquids Postural changes: Position pt fully upright for meals;Stay upright 30-60 min after meals Oral care recommendations:  Staff/trained caregiver to provide oral care;Oral care before ice chips/water Caregiver Recommendations: Have oral suction available      Zachary Winters 07/04/2023,10:55 AM

## 2023-07-04 NOTE — Progress Notes (Signed)
Occupational Therapy Session Note  Patient Details  Name: Zachary Winters MRN: [id number] Date of Birth: 1981-07-04  Today's Date: 07/04/2023 OT Individual Time: 4010-2725 OT Individual Time Calculation (min): 73 min    Short Term Goals: Week 1:  OT Short Term Goal 1 (Week 1): pt will be able to hold static sit balance with CGA to enable him to sit safely on a BSC. OT Short Term Goal 2 (Week 1): Pt will be able to sit to stand in a stedy lift with mod A to enable a safe transfer to a BSC. OT Short Term Goal 3 (Week 1): Pt will demonstrate improved R side attention by completing RUE self ROM to prevent contractures due to hypertone, OT Short Term Goal 4 (Week 1): Pt will sit to stand with max A to enable care givers to pull clothing up over hips.  Skilled Therapeutic Interventions/Progress Updates: Pt greeted supine in bed , pt agreeable to OT intervention. Session focus on functional sit>stands for RLE NMR for higher level ADL tasks, RUE NMR, and dynamic sitting balance.   RN enter to disconnect fluids for session. Pt completed supine>sit with MODA to maneuver RLE and elevate trunk into sitting, pt required MIN cues for sequencing of task. Pt completed squat pivot transfer to TIS to L side with MOD +2. Total A transport to gym for time mgmt.   Pt completed multiple sit>stands using Railing on L side with pt able to stand with MOD- MAX A with RLE blocked and mirror provided for visual feedback. Pt heavily relying on LUE to pull into standing. Pt requires MAX cues for anterior weight shift to power into standing.   Pt completed squat pivots to mat table to R and L side with MODA +2 to L side and MAX A + 2 to R side. Pt requires MAX cues for sequencing and technique of transfer technique. Once EOM, worked on dynamic reaching with pt able to reach out of BOS to retrieve cones with overall MINA. Pt does continue to lose balance to R side however pt with improved righting reactions able to initiate  self correcting.   Worked on [employer] with pt completing lateral leans onto RUE from EOM. Pt required overall MIN A +2 for safety for balance and to return to midline. Worked on sit>stands from mat table with pt having difficulty shifting weight anteriorly in order to rise into standing and pushing to R side. Pt required overall MAX A +2 for safety to stand from EOM.   Of note pt liable at end of session seeming to be frustrated by lack of progress. Pt was able to answer yes/no questions correctly today and with options pt selected correct answers to questions such as , " is this red or green." Pt did give a thumbs up during session and reached out to shake therapists hand at end of session.                    Ended session with pt supine in bed with all needs within reach and bed alarm activated.                    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     Therapy/Group: Individual Therapy  [name] 07/04/2023, 4:10 PM

## 2023-07-05 LAB — GLUCOSE, CAPILLARY
Glucose-Capillary: 163 mg/dL — ABNORMAL HIGH (ref 70–99)
Glucose-Capillary: 158 mg/dL — ABNORMAL HIGH (ref 70–99)
Glucose-Capillary: 205 mg/dL — ABNORMAL HIGH (ref 70–99)
Glucose-Capillary: 143 mg/dL — ABNORMAL HIGH (ref 70–99)
Glucose-Capillary: 190 mg/dL — ABNORMAL HIGH (ref 70–99)

## 2023-07-05 MED ORDER — GLUCERNA SHAKE PO LIQD
237.0000 mL | Freq: Two times a day (BID) | ORAL | Status: DC
  Administered 2023-07-05 – 2023-07-06 (×2): 237 mL via ORAL

## 2023-07-05 MED ORDER — ADULT MULTIVITAMIN W/MINERALS CH
1.0000 | ORAL_TABLET | Freq: Every day | ORAL | Status: DC
  Administered 2023-07-05 – 2023-07-25 (×21): 1 via ORAL
  Filled 2023-07-05 (×21): qty 1

## 2023-07-05 NOTE — Progress Notes (Signed)
Patient ID: Zachary Winters, male   DOB: 1981/01/27, 42 y.o.   MRN: [id number]  Team Conference Report to Patient/Family  Team Conference discussion was reviewed with the patient and caregiver, including goals, any changes in plan of care and target discharge date.  Patient and caregiver express understanding and are in agreement.  The patient has a target discharge date of 07/25/23.  Sw spoke with pt's sister, [name] to discuss team conference updates. Patient making slow progress. SW invited sister and family to begin family education. Family has arranged to begin education 7/20 1-4 PM. Patient eating 75% of meals as of today and team will hold off on PEG. Patient is now on a D2 diet, thin liquid requiring full supervision. No additional questions or concerns.  Andria Rhein 07/05/2023, 1:46 PM

## 2023-07-05 NOTE — Plan of Care (Signed)
  Problem: Consults Goal: RH STROKE PATIENT EDUCATION Description: See Patient Education module for education specifics  Outcome: Progressing   Problem: RH BOWEL ELIMINATION Goal: RH STG MANAGE BOWEL WITH ASSISTANCE Description: STG Manage Bowel with mod I Assistance. Outcome: Progressing Goal: RH STG MANAGE BOWEL W/MEDICATION W/ASSISTANCE Description: STG Manage Bowel with Medication with mod I  Assistance. Outcome: Progressing   Problem: RH BLADDER ELIMINATION Goal: RH STG MANAGE BLADDER WITH ASSISTANCE Description: STG Manage Bladder With toileting Assistance Outcome: Progressing   Problem: RH SAFETY Goal: RH STG ADHERE TO SAFETY PRECAUTIONS W/ASSISTANCE/DEVICE Description: STG Adhere to Safety Precautions With cues Assistance/Device. Outcome: Progressing   Problem: RH KNOWLEDGE DEFICIT Goal: RH STG INCREASE KNOWLEDGE OF DIABETES Description: Patient and family will be able to manage DM with medications and dietary modifications using educational resources independently Outcome: Progressing Goal: RH STG INCREASE KNOWLEDGE OF HYPERTENSION Description: Patient and family will be able to manage HTN with medications and dietary modifications using educational resources independently Outcome: Progressing Goal: RH STG INCREASE KNOWLEDGE OF DYSPHAGIA/FLUID INTAKE Description: Patient and family will be able to manage Dysphagia, medications and dietary modifications using educational resources independently Outcome: Progressing Goal: RH STG INCREASE KNOWLEGDE OF HYPERLIPIDEMIA Description: Patient and family will be able to manage HLD with medications and dietary modifications using educational resources independently Outcome: Progressing Goal: RH STG INCREASE KNOWLEDGE OF STROKE PROPHYLAXIS Description: Patient and family will be able to manage secondary risks with medications and dietary modifications using educational resources independently Outcome: Progressing   Problem:  Education: Goal: Knowledge of disease or condition will improve Outcome: Progressing Goal: Knowledge of secondary prevention will improve (MUST DOCUMENT ALL) Outcome: Progressing Goal: Knowledge of patient specific risk factors will improve Loraine Leriche N/A or DELETE if not current risk factor) Outcome: Progressing   Problem: Ischemic Stroke/TIA Tissue Perfusion: Goal: Complications of ischemic stroke/TIA will be minimized Outcome: Progressing   Problem: Coping: Goal: Will verbalize positive feelings about self Outcome: Progressing Goal: Will identify appropriate support needs Outcome: Progressing   Problem: Health Behavior/Discharge Planning: Goal: Ability to manage health-related needs will improve Outcome: Progressing Goal: Goals will be collaboratively established with patient/family Outcome: Progressing   Problem: Self-Care: Goal: Ability to participate in self-care as condition permits will improve Outcome: Progressing Goal: Verbalization of feelings and concerns over difficulty with self-care will improve Outcome: Progressing Goal: Ability to communicate needs accurately will improve Outcome: Progressing   Problem: Nutrition: Goal: Risk of aspiration will decrease Outcome: Progressing Goal: Dietary intake will improve Outcome: Progressing   Problem: Education: Goal: Ability to describe self-care measures that may prevent or decrease complications (Diabetes Survival Skills Education) will improve Outcome: Progressing Goal: Individualized Educational Video(s) Outcome: Progressing   Problem: Coping: Goal: Ability to adjust to condition or change in health will improve Outcome: Progressing   Problem: Fluid Volume: Goal: Ability to maintain a balanced intake and output will improve Outcome: Progressing   Problem: Health Behavior/Discharge Planning: Goal: Ability to identify and utilize available resources and services will improve Outcome: Progressing Goal: Ability  to manage health-related needs will improve Outcome: Progressing   Problem: Metabolic: Goal: Ability to maintain appropriate glucose levels will improve Outcome: Progressing   Problem: Nutritional: Goal: Maintenance of adequate nutrition will improve Outcome: Progressing Goal: Progress toward achieving an optimal weight will improve Outcome: Progressing   Problem: Skin Integrity: Goal: Risk for impaired skin integrity will decrease Outcome: Progressing   Problem: Tissue Perfusion: Goal: Adequacy of tissue perfusion will improve Outcome: Progressing

## 2023-07-05 NOTE — Progress Notes (Signed)
Physical Therapy Session Note  Patient Details  Name: Zachary Winters MRN: [id number] Date of Birth: [birth year]  Today's Date: 07/05/2023 PT Individual Time: 1055-1205 PT Individual Time Calculation (min): 70 min   Short Term Goals: Week 1:  PT Short Term Goal 1 (Week 1): Pt will perform bed mobility at overall ModA. PT Short Term Goal 2 (Week 1): Pt will perform sit<>stand with overall maxA +1 PT Short Term Goal 3 (Week 1): pt will maintain sitting balance to midline with modA +1 for at least . PT Short Term Goal 4 (Week 1): pt will perform squat pivot transfer with MaxA +1 PT Short Term Goal 5 (Week 1): Pt will complete PASS for outcome measure.  Skilled Therapeutic Interventions/Progress Updates: Patient supine in bed with family present on entrance to room. Patient alert and agreeable to PT session.   Patient reported no pain at beginning of session. Pt required personal care with soiled brief. PTA and rehab tech facilitated change totalA for time management and donned new brief/personal pants supine in bed with pt giving modA to fulling donn personal pants.   Therapeutic Activity: Bed Mobility: Pt performed supine<>sit on EOB with modA. VC required for pt to use L UE to pull self over to R sidelying via R HOB rail. Pt then cued to elevate trunk by pushing into bed with L UE (pt required increased effort and multimodal cues to perform task per presentation of attempting to pull R HOB rail, or pull bed (PTA demonstrated pushing with hand into bed).  Transfers: Pt performed sit<>stands in // bard from TIS with minA for safety, and maxA to place B LE's in appropriate places. Pt performed squat pivots from EOB to TIS (to L) with modA +2 for safety (multimodal cues for patient to place L UE on farthest TIS armrest to pull self over, and to anteriorly lean to R for head/hip relationship. Pt also required mod cues to anteriorly/posteriorly scoot EOB and in TIS with maxA (totalA to advance R  hips).  Gait Training:  Pt ambulated 35' using LHR outside of main gym in hallway with PTA on R side facilitating R LE advancement (totalA) and standing balance (maxA) with leg lift donned on R LE). Pt required mod cues to keep L UE close to trunk in order to promote/maintain upright posture for greater gait sequence. Pt with wide BOS (keeping L LE outside of neutral BOS, and required cues to laterally weight shift to L as to avoid heavily relying PTA on R side). PTA provided maxA to block R knee from buckling, and maxA to anteriorly advance B hips in their respective sequence of swing phase. Pt started to express sadness after gait trial at end of session. PTA consoled and verbally embraced pt through with encouragement at end of session while pt in room.  Neuromuscular Re-ed: NMR facilitated during session with focus on static standing balance and postural control. - static standing in // bars with PTA in front. Pt cued to increase WB on L LE per presentation of requiring max/totalA to prevent falling to R side. Pt use of L UE on // bars to assist in upright stance, and PTA providing tactile feedback on R hip abductors while giving tactile cues for pt to tuck in bottom on L hip musculature per R hip drop (max cues required). Pt then cued to release L UE support with PTA providing same cues and tactile feedback. Pt noted to have several seconds of upright posture with neutral hips (  PTA providing heavy min/modA). Some block to R knee required, but minA as most of WB was focused on L LE.   NMR performed for improvements in motor control and coordination, balance, sequencing, judgement, and self confidence/ efficacy in performing all aspects of mobility at highest level of independence.   Patient in TIS at end of session with brakes locked, bed alarm set, and all needs within reach.       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

## 2023-07-05 NOTE — Progress Notes (Signed)
Speech Language Pathology Weekly Progress and Session Note  Patient Details  Name: Zachary Winters MRN: [id number] Date of Birth: 22-Nov-1981  Beginning of progress report period: June 28, 2023 End of progress report period: July 05, 2023  Today's Date: 07/05/2023 SLP Individual Time: 0830-0922 SLP Individual Time Calculation (min): 52 min and Today's Date: 07/05/2023 SLP Missed Time: 8 Minutes Missed Time Reason:  (MD present for assessment)  Short Term Goals: Week 1: SLP Short Term Goal 1 (Week 1): pt will participate in repeatt MBSS to determine diet advancement as indicated. SLP Short Term Goal 1 - Progress (Week 1): Met SLP Short Term Goal 2 (Week 1): Pt will tolerte Dys 2 trials with no overt or subtle s/s of aspiration. SLP Short Term Goal 2 - Progress (Week 1): Met SLP Short Term Goal 3 (Week 1): Pt will utilize compensatory swallow strategies to reduced or clear oral residue as needed with min assistance. SLP Short Term Goal 3 - Progress (Week 1): Progressing toward goal SLP Short Term Goal 4 (Week 1): Pt will repeat functional words with >50% intelligiblity given direct verbal model. SLP Short Term Goal 4 - Progress (Week 1): Progressing toward goal SLP Short Term Goal 5 (Week 1): Pt will follow 1-step commands with >75% accuracy given min cues. SLP Short Term Goal 5 - Progress (Week 1): Not met SLP Short Term Goal 6 (Week 1): Pt will answer basic Y/N questions with >90% accuracy given min cues. SLP Short Term Goal 6 - Progress (Week 1): Progressing toward goal    New Short Term Goals: Week 2: SLP Short Term Goal 1 (Week 2): Pt will tolerate Dys 2 diet and thin liquids via Provale cup with minimal s/s of aspiration and no indications of development of aspiration pneumonia. SLP Short Term Goal 2 (Week 2): Pt will answer basic Y/N questions with >90% accuracy given min cues. SLP Short Term Goal 3 (Week 2): Pt will follow 1-step commands with >75% accuracy given min cues. SLP  Short Term Goal 4 (Week 2): Pt will utilize compensatory swallow strategies to reduced or clear oral residue as needed with min assistance.  Weekly Progress Updates:  Patient has made progressive gains and has met 2 of 6 STGs this reporting period. Patient is currently completing simple speech and language tasks overall mod-max A and cues in regards to production of single words, following simple commands, and providing accurate Y/N responses. Patient underwent a MBSS on 07/04/23 with improvements in swallow function observed. His diet was advanced to a Dys 2  diet with thin liquids with use of 10cc Provale cup and full supervision to implement swallowing compensatory strategies. Per EMR and observation of breakfast tray, pt has been consuming ~75% of meals since diet advancement to Dys 2 yesterday. Patient and family education is ongoing. Patient would benefit from continued skilled SLP intervention to maximize swallowing, speech, and cognitive-linguistic functioning and overall functional independence prior to discharge.      Intensity: Minumum of 1-2 x/day, 30 to 90 minutes Frequency: 3 to 5 out of 7 days Duration/Length of Stay: 18-22 days Treatment/Interventions: Speech/Language facilitation;Therapeutic Activities;Cueing hierarchy;Therapeutic Exercise;Dysphagia/aspiration precaution training;Patient/family education   Daily Session  Skilled Therapeutic Interventions:  Pt seen for skilled SLP session to address speech, language, and swallowing goals.   Speech/Language: Addressed various modes of communication including verbalization of functional words, use of functional gestures, and attempt at functional communication board. Pt demonstrated good approximations of first/last name, automatic sequences, and functional words (bathroom, thirsty, hungry,  bed) with mod cues. Good vowel production of targets observed with omission of majority of consonants. Pt production of his name, Zachary Winters, was  "eye- errr" "nn-eee". Some improvement with production of "l" sound given max verbal and visual cues. Pt challenged with effective use of simple communication board. He did not indicate awareness of images or use despite max cues. Will continue to attempt.   Swallowing: Pt received with ~75% of breakfast tray consumed. Nurse tech had reported some coughing noted during breakfast. Per chart review pt remains afebrile with WBC WNL. He is on room air. CXR on 07/03/23 indicated clear lungs without evidence of cardiopulmonary disease. SLP observed pt take meds crushed in applesauce with no overt or subtle s/s of aspiration. He was able to clear bolus from oral cavity with no residue of medications observed. Pt agreeable to PO trials of thin liquids by 10cc Provale cup and open cup totaling ~3-4 oz and soft solids x2-3 bites.   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 Strongly recommend that nursing reinforces these compensatory swallow strategies during meals and intake of liquids. Strategies and diet recommendations posted at [facility]. Occasional instances of delayed coughing was observed.  Unable to determine if coughing was related to airway violation of residue vs a dry cough. Pt's voice remained clear throughout session and no s/s of respiratory distress were observed.  Though pt is at a risk of aspiration secondary to his dysphagia, he does appear to be tolerating his current diet without concerns for aspiration pneumonia. Pt does have a good, strong cough, which is an appropriate response to protecting his airway or clearing residue as needed. I strongly advise against alternative nutrition at this time. Case discussed with Dr. Wynn Banker.     Pt left sitting upright in bed with bed alarm activated and physician at bedside. Pt missed  8 minutes of session due to physician assessment.   Pain  None reported   Therapy/Group: Individual Therapy  Ellery Plunk 07/05/2023, 9:43 AM

## 2023-07-05 NOTE — Progress Notes (Signed)
Nutrition Follow-up  DOCUMENTATION CODES:   Not applicable  INTERVENTION:  - Add Glucerna Shake po BID, each supplement provides 220 kcal and 10 grams of protein  - Add MVI q day.   NUTRITION DIAGNOSIS:   Inadequate oral intake related to lethargy/confusion, dysphagia as evidenced by meal completion < 25%.  GOAL:   Patient will meet greater than or equal to 90% of their needs  MONITOR:   PO intake  REASON FOR ASSESSMENT:   New TF Calorie Count  ASSESSMENT:   42 y.o. 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 PMH includes: DM.  Meds reviewed:  lipitor, sliding scale insulin, semglee, senokot, Vit D. Labs reviewed: WDL.   Pt previously with Cortrak tube in for supplemental nutrition, but cortrak got pulled out on 7/7. Plan was to replace, but then PO intakes improved. Per record, pt ate 85% of his breakfast this am. Pt also ate 75% of his meals yesterday. RD will add Glucerna BID for now as well to help supplement PO intakes. RD will continue to closely monitor PO intakes.   Diet Order:   Diet Order             DIET DYS 2 Room service appropriate? Yes; Fluid consistency: Thin  Diet effective now                   EDUCATION NEEDS:   Not appropriate for education at this time  Skin:  Skin Assessment: Reviewed RN Assessment  Last BM:  7/9 - type 6  Height:   Ht Readings from Last 1 Encounters:  06/27/23 6\' 1"  (1.854 m)    Weight:   Wt Readings from Last 1 Encounters:  07/05/23 83 kg    Ideal Body Weight:     BMI:  Body mass index is 24.14 kg/m.  Estimated Nutritional Needs:   Kcal:  2130-2550 kcals  Protein:  105-125 gm  Fluid:  2.1-2.5 L  Bethann Humble, RD, LDN, CNSC.

## 2023-07-05 NOTE — Progress Notes (Signed)
Occupational Therapy Session Note  Patient Details  Name: Zachary Winters MRN: [id number] Date of Birth: Jun 13, 1981  Today's Date: 07/05/2023 OT Individual Time: 1478-2956 OT Individual Time Calculation (min): 47 min  and Today's Date: 07/05/2023 OT Missed Time: 28 Minutes Missed Time Reason: Unavailable (comment);Other (comment) (eating lunch)   Short Term Goals: Week 2:  OT Short Term Goal 1 (Week 2): pt will complete squat pivot transfers with MOD A +2  as precursor to safe toilet transfers OT Short Term Goal 2 (Week 2): pt will complete sit>stands with MODA +2 to improve pts ability to sit>stand for LB dressing OT Short Term Goal 3 (Week 2): pt will sit EOB with CGA for > 5 mins as precursor to higher level ADLs from EOB  Skilled Therapeutic Interventions/Progress Updates:  Pt greeted seated in w/c with NT present assisting pt with lunch. Returned 13 mins later with pt agreeable to OT intervention. Total A transport to gym for time mgmt. Pt completed squat pivot transfer from w/c<> EOM and EOB with overall MOD- MAX A +2. Worked on anterior weight shift as pt with difficulty shifting weight forward during sit>stands. Pt instructed to reach forward to mirror to remove squigz with LUE with an emphasis on leaning forward as precursor to improved sit>stands. Pt needed MIN - MOD A for dynamic sitting balance.   Worked on [employer] with pt instructed to lateral lean onto RUE while LUE reached across midline to visually scan to R side to retrieve cones and place cones on L side of mat. Pt with difficulty processing multiple steps needing MAX step by step cues to sequence task correctly.       Worked on repetitive sit>stands from EOM for RLE NMR with pt needing MOD- MAX A +1 to rise into standing with mirror provide for visual feedback.    Transitioned pt to supine on mat table to initiate self ROM practice for RUE. Pt able to locate RUE with LUE and complete several reps of elbow flexion/extension  and shoulder flexion to 90* only. Education provided on importance of completing self ROM from bed level.   Of note pt continues to be emotional during sessions, provide emotional support as needed.               Ended session with pt supine in bed with all needs within reach and bed alarm activated.                    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, honey thick liquids only Restrictions Weight Bearing Restrictions: No General: General OT Amount of Missed Time: 28 Minutes  Pain: No indications of pain noted.    Therapy/Group: Individual Therapy  Pollyann Glen [facility] 07/05/2023, 4:08 PM

## 2023-07-05 NOTE — Progress Notes (Signed)
PROGRESS NOTE   Subjective/Complaints:  MBS note reviewed , per SLP ate 75% meal at lunch and dinner yesterday and breakfast  Reportedly taking meds po as well as liquid  Fluid intake improved but still low at , still on IV hydration for dehydration   ROS: limited due to language/communication   Objective:   DG Swallowing Func-Speech Pathology  Result Date: 07/04/2023 Table formatting from the original result was not included. Modified Barium Swallow Study Patient Details Name: Zachary Winters MRN: [id number] Date of Birth: [birth year] Today's Date: 07/04/2023 HPI/PMH: HPI: Pt is a 42 year old male who presented with right-sided weakness, right-sided facial droop and difficulty speaking. 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 BSE 6/19 with minimal R anterior spillage, decreased coordination and R anterior spillage; dysphagia 2 diet with thin liquids recommended. 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 Similar petechial hemorrhage without mass occupying acute hemorrhage. New/interval small acute infarct in the right posterior limb of the internal capsule. MBS 6/24: mod/severe oral phase dysphagia and mild/mod pharyngeal phase dysphagia characterized by weak lingual movement and bolus manipulation with labial spillage, oral holding, and spillover to the pharynx. An NPO status recommended with snacks of puree and honey thick liquids. PMH: diabetes mellitus on insulin, chronic daily smoker, hypertension, history of marijuana use, diabetic peripheral neuropathy, hyperlipidemia Clinical Impression: Pt is a 42 year old male who presented with right-sided weakness, right-sided facial droop  and difficulty speaking. 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 BSE 6/19 with minimal R anterior spillage, decreased coordination and R anterior spillage; dysphagia 2 diet with thin liquids recommended. 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 Similar petechial hemorrhage without mass occupying acute hemorrhage. New/interval small acute infarct in the right posterior limb of the internal capsule. MBS 6/24: mod/severe oral phase dysphagia and mild/mod pharyngeal phase dysphagia characterized by weak lingual movement and bolus manipulation with labial spillage, oral holding, and spillover to the pharynx. An NPO status recommended with snacks of puree and honey thick liquids. PMH: diabetes mellitus on insulin, chronic daily smoker, hypertension, history of marijuana use, diabetic peripheral neuropathy, hyperlipidemia   Clinical Impression  Pt presents with a moderate oral dysphagia and a mild pharyngeal dysphagia per MBSS completed today.  Oral deficits characterized by reduced oral strength and coordination, which resulted in anterior spillage of liquids from the R side, incomplete oral transit of liquids and solids, and posterior loss of all liquids to the level of the pyriform sinuses before the swallow. Pt required frequent cues to initiate subsequent dry swallow in effort to reduce moderate amount of oral residue.  Pharyngeal deficits characterized by reduced hyolaryngeal elevation/excursion, reduced base of tongue retraction, reduced larygneal vestibule closure, reduced pharyngeal stripping, and reduced distention  of upper esophageal sphincter.  Findings -There was x1 instance of suspected aspiration of thin liquids after consecutive, large swallows  that were pt initiated by pt. Aspiration inferred due to significant coughing spell once fluoro was turned off. No e/o aspirated residue observed in the trachea once fluoro was turned back on. Suspect aspiration was either ejected or unable to be viewed on fluoro due to minimal amount. -There was inconsistent, trace, transient, shallow penetration of thin liquids by cup and straw sip before/during the swallow. -There was x1 instance of scant, transient, shallow penetration of nectar-thick liquid residue after the swallow. -Cued dry swallows and liquid washes were effective for reduced moderate oral residue and min-mod diffuse pharyngeal residue. Pt, otherwise, did not appeared aware or sensate of this residue.  Detailed diet recommendations and aspiration precautions outlined below. Pt's mother was updated with updated diet recommendations following the study.  Plan: SLP will continue to follow to assess diet tolerance and modify diet as indicated. No data recorded Factors that may increase risk of adverse event in presence of aspiration Zachary Winters & [name] 2021): Factors that may increase risk of adverse event in presence of aspiration Zachary Winters & [name] 2021): Limited mobility; Dependence for feeding and/or oral hygiene; Inadequate oral hygiene Recommendations/Plan: Swallowing Evaluation Recommendations Swallowing Evaluation Recommendations Recommendations: PO diet PO Diet Recommendation: Dysphagia 2 (Finely chopped); Thin liquids (Level 0) Liquid Administration via: Cup (10cc Provale cup) Medication Administration: Crushed with puree Supervision: Full supervision/cueing for swallowing strategies; Staff to assist with self-feeding Swallowing strategies  : Slow rate; Small bites/sips; Check for pocketing or oral holding; Check for anterior loss; Multiple dry swallows after each bite/sip; Follow solids with liquids Postural changes: Position pt fully upright for meals; Stay upright 30-60 min after meals Oral care  recommendations: Staff/trained caregiver to provide oral care; Oral care before ice chips/water Caregiver Recommendations: Have oral suction available Treatment Plan Treatment Plan Treatment recommendations: Therapy as outlined in treatment plan below Follow-up recommendations: Outpatient SLP Functional status assessment: Patient has not had a recent decline in their functional status. Treatment frequency: Min 3x/week Treatment duration: 2 weeks Interventions: Aspiration precaution training; Patient/family education; Trials of upgraded texture/liquids; Compensatory techniques; Diet toleration management by SLP Recommendations Recommendations for follow up therapy are one component of a multi-disciplinary discharge planning process, led by the attending physician.  Recommendations may be updated based on patient status, additional functional criteria and insurance authorization. Assessment: Orofacial Exam: Orofacial Exam Oral Cavity: Oral Hygiene: Lingual coating (on nystatin) Oral Cavity - Dentition: Adequate natural dentition Orofacial Anatomy: WFL Oral Motor/Sensory Function: Suspected cranial nerve impairment CN V - Trigeminal: Not tested CN VII - Facial: Right motor impairment CN IX - Glossopharyngeal, CN X - Vagus: Right motor impairment CN XII - Hypoglossal: Not tested Anatomy: No data recorded Boluses Administered: Boluses Administered Boluses Administered: Thin liquids (Level 0); Mildly thick liquids (Level 2, nectar thick); Moderately thick liquids (Level 3, honey thick); Puree; Solid  Oral Impairment Domain: Oral Impairment Domain Lip Closure: Escape progressing to mid-chin Tongue control during bolus hold: Posterior escape of greater than half of bolus Bolus preparation/mastication: Slow prolonged chewing/mashing with complete recollection Bolus transport/lingual motion: Repetitive/disorganized tongue motion Oral residue: Residue collection on oral structures Location of oral residue : Floor of mouth;  Tongue Initiation of pharyngeal swallow : Pyriform sinuses  Pharyngeal Impairment Domain: Pharyngeal Impairment Domain Soft palate elevation: No bolus between soft palate (SP)/pharyngeal wall (PW) Laryngeal elevation: Partial superior movement of thyroid cartilage/partial approximation of arytenoids to epiglottic petiole Anterior hyoid  excursion: Partial anterior movement Epiglottic movement: Complete inversion Laryngeal vestibule closure: Incomplete, narrow column air/contrast in laryngeal vestibule Pharyngeal stripping wave : Present - diminished Pharyngeal contraction (A/P view only): N/A Pharyngoesophageal segment opening: Partial distention/partial duration, partial obstruction of flow Tongue base retraction: Trace column of contrast or air between tongue base and PPW Pharyngeal residue: Collection of residue within or on pharyngeal structures Location of pharyngeal residue: Valleculae; Pyriform sinuses; Tongue base  Esophageal Impairment Domain: Esophageal Impairment Domain Esophageal clearance upright position: Complete clearance, esophageal coating Pill: No data recorded Penetration/Aspiration Scale Score: Penetration/Aspiration Scale Score 1.  Material does not enter airway: Puree; Moderately thick liquids (Level 3, honey thick); Thin liquids (Level 0); Mildly thick liquids (Level 2, nectar thick); Solid 2.  Material enters airway, remains ABOVE vocal cords then ejected out: Thin liquids (Level 0); Mildly thick liquids (Level 2, nectar thick) 3.  Material enters airway, remains ABOVE vocal cords and not ejected out: Thin liquids (Level 0) 6.  Material enters airway, passes BELOW cords then ejected out: Thin liquids (Level 0) (suspected based on cough response, though not viewed due to fluro off) Compensatory Strategies: Compensatory Strategies Compensatory strategies: Yes Multiple swallows: Effective Effective Multiple Swallows: Thin liquid (Level 0); Mildly thick liquid (Level 2, nectar thick); Moderately  thick liquid (Level 3, honey thick) (reduced residue) Liquid wash: Effective Effective Liquid Wash: Puree; Solid (reduced residue)   General Information: Caregiver present: No (SLP provided education with pt's mom at bedside)  Diet Prior to this Study: Dysphagia 1 (pureed); Moderately thick liquids (Level 3, honey thick)   Temperature : Normal   Respiratory Status: WFL   Supplemental O2: None (Room air)   History of Recent Intubation: No  Behavior/Cognition: Alert; Pleasant mood; Requires cueing; Impulsive Self-Feeding Abilities: Needs assist with self-feeding Baseline vocal quality/speech: Not observed Volitional Cough: Unable to elicit Volitional Swallow: Able to elicit (delayed with multiple cues) Exam Limitations: -- (fluro camera off during possible aspiration event) Goal Planning: Prognosis for improved oropharyngeal function: Fair Barriers to Reach Goals: Language deficits; Severity of deficits No data recorded Patient/Family Stated Goal: none stated Consulted and agree with results and recommendations: Nurse; Family member/caregiver; Nurse Tech Pain: Pain Assessment Pain Assessment: No/denies pain Faces Pain Scale: 0 SLP visit diagnosis: SLP Visit Diagnosis: Dysphagia, oropharyngeal phase (R13.12); Aphasia (R47.01); Dysarthria and anarthria (R47.1) Past Medical History: Past Medical History: Diagnosis Date  Nausea and vomiting   chronic for years.  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 EXAM: CHEST - 2 VIEW COMPARISON:  June 27, 2023 FINDINGS: The heart size and mediastinal contours are within normal limits. Both lungs are clear. The visualized skeletal structures are unremarkable. IMPRESSION: No active cardiopulmonary disease. Electronically Signed   By: Aram Candela M.D.   On: 07/03/2023 20:14   Recent Labs    07/03/23 0923  WBC 9.9  HGB 11.0*  HCT 32.4*  PLT 361    Recent Labs    07/03/23 0923 07/04/23 0537  NA 135 137  K 4.2 4.2  CL 99 103  CO2 26 26  GLUCOSE 178* 167*  BUN 19 17  CREATININE 1.60* 1.39*  CALCIUM 8.8* 8.8*     Intake/Output Summary (Last 24 hours) at 07/05/2023 0925 Last data filed at 07/04/2023 1809 Gross per 24 hour  Intake 355 ml  Output --  Net 355 ml         Physical Exam: Vital Signs Blood pressure (!) 152/90, pulse (!) 106, temperature 98.9 F (37.2 C), resp. rate 20, height 6\' 1"  (1.854 m), weight 83 kg, SpO2 98 %.  General: No acute distress Mood and affect are appropriate Heart: Regular rate and rhythm no rubs murmurs or extra sounds Lungs: Clear to auscultation, breathing unlabored, no rales or wheezes Abdomen: Positive bowel sounds, soft nontender to palpation, nondistended Extremities: No clubbing, cyanosis, or edema  Skin: No evidence of breakdown, no evidence of rash Neurologic: Aphasic but communicates with head nods, y/n. Cranial nerves II through XII intact, motor strength is 5/5 in left and 0/5 right  deltoid, bicep, tricep, grip, hip flexor, knee extensors, ankle dorsiflexor and plantar flexor RUE extension with yawn  Flexor withdrawal spasticity remains severe RLE, MAS 3 in Left elbow flexors Sensory exam reduced pinch sensation on RIght   Musculoskeletal: Full range of motion in all 4 extremities. No joint swelling  Assessment/Plan: 1. Functional deficits which require 3+ hours per day of interdisciplinary therapy in a comprehensive inpatient rehab  setting. Physiatrist is providing close team supervision and 24 hour management of active medical problems listed below. Physiatrist and rehab team continue to assess barriers to discharge/monitor patient progress toward functional and medical goals  Care Tool:  Bathing        Body parts bathed by helper: Right arm, Left arm, Chest, Abdomen, Front perineal area, Buttocks, Right upper leg, Left upper leg, Face, Left lower leg, Right lower leg     Bathing assist Assist Level: Dependent - Patient 0%     Upper Body Dressing/Undressing Upper body dressing   What is the patient wearing?: Pull over shirt    Upper body assist Assist Level: Total Assistance - Patient < 25%    Lower Body Dressing/Undressing Lower body dressing      What is the patient wearing?: Underwear/pull up, Incontinence brief     Lower body assist Assist for lower body dressing: Dependent - Patient 0%     Toileting Toileting    Toileting assist Assist for toileting: Dependent - Patient 0%     Transfers Chair/bed transfer  Transfers assist  Chair/bed transfer activity did not occur: Safety/medical concerns  Chair/bed transfer assist level: Dependent - mechanical lift (STEDY)     Locomotion Ambulation   Ambulation assist      Assist level: Maximal Assistance - Patient 25 - 49% Assistive device: Other (comment) (L hallway handrail) Max distance: 35 ft   Walk 10 feet activity   Assist     Assist level: Maximal Assistance - Patient 25 - 49% Assistive device: Other (comment) (L hallway hand rail)   Walk 50 feet activity   Assist Walk 50 feet with 2 turns activity did not occur: Safety/medical concerns         Walk 150 feet activity   Assist Walk 150 feet activity did not occur: Safety/medical concerns         Walk 10 feet on uneven surface  activity   Assist Walk 10 feet on uneven surfaces activity did not occur: Safety/medical concerns  Wheelchair     Assist Is the patient using a wheelchair?: Yes Type of Wheelchair: Manual    Wheelchair assist level: Dependent - Patient 0% Max wheelchair distance: 150 ft    Wheelchair 50 feet with 2 turns activity    Assist        Assist Level: Dependent - Patient 0%   Wheelchair 150 feet activity     Assist      Assist Level: Dependent - Patient 0%   Blood pressure (!) 152/90, pulse (!) 106, temperature 98.9 F (37.2 C), resp. rate 20, height 6\' 1"  (1.854 m), weight 83 kg, SpO2 98 %.  Medical Problem List and Plan: 1. 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 Severe Left M1 and V4 stenosis              -patient may  shower             -ELOS/Goals: 18-22 days- min A for PT, OT and SLP   -Continue CIR therapies including PT, OT, and SLP   2.  Antithrombotics: -DVT/anticoagulation:  Pharmaceutical: Lovenox, LE doppler neg              -antiplatelet therapy: DAPT for at leasat  3 months. Has declined stenting.   3. Pain Management: Tylenol prn.  4. Mood/Behavior/Sleep: LCSW to follow   for evaluation and support when appropriate.              -antipsychotic agents: N/A 5. Neuropsych/cognition: This patient is not capable of making decisions on his own behalf. 6. Skin/Wound Care: Routine pressure relief measures.  7. Fluids/Electrolytes/Nutrition:   7/8-cortrak came out last night. Indicated he was hungry  --will push po again today,   -resumed diet  -add IVF for hydration  -may need to reinsert Cortrak, anticipate need for PEG, will need family okay for this. 8. 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 HTN: Monitor BP TID--long term goal normotensive             --continue amlodipine 10 mg/day and coreg  6.25 mg BID.  Vitals:   07/05/23 0604 07/05/23 0832  BP: (!) 131/97 (!) 152/90  Pulse: (!) 106  (!) 106  Resp: 20   Temp: 98.9 F (37.2 C)   SpO2: 98%    Keep in 140-180 range due to severe intracranial stenosis -slightly below target BP reduce  will  increase coreg and reduce amlodipine 7/10 in range although diastolic creeping up will d/c IVF now that pt is eating and drinking better per SLP  10. T2DM w/peripheral neuropathy: Hgb A1c- 11.7. Was on Lantus 30 units PTA?  --Continue Insulin glargline-->was decreased to  5 units/HS today(to start tomorrow as received 10 units this am).   --Hold every 4 hours novolog as not on TF at this moment.  CBG (last 3)  Recent Labs    07/04/23 2114 07/05/23 0604 07/05/23 0801  GLUCAP 185* 163* 158*    Increased Semglee to 15U 7/7 Change SSI to moderate  7/10- controlled  11. Acute on chronic renal failure?:  Improved with IVF for hydration-->was d/c yesterday but will resume with questionable intake --.SCr 1.02 in 09/22 per chart review. SCr 1.57 @ admission-->1.29-->1.31  PO4 and Mg ok 7/7 7/8 Cr sl elevated close to admission level, IVF as above-recheck later this week. 12. Severe dysphagia: Continue D1, honey liquids. Willing to take liquids-->will offer  thickened Glucerna with meals for now.         13. Hyperlipidemia: LDL 265. On Lipitor 80 mg daily. 14. Severe vitamin D deficiency: On Ergocalciferol weekly with dialy low supplement.  15. ABLA: Hgb 13.1 @ admission-->11.1-->9.2. Will order stool guaiacs- none collected 7/5.              --Senna added at bedtime as last BM 06/29.   7/8-stool guaiac negative 7/7     -hgb up to 11.0 16. Low grade fevers: Encourage pulmonary hygiene.  17. Hx tobacco/Marijuana use: Continue  nicotine patch.  Encourage cessation of Marijuana.  18. Intermittent episodes of N/V: Question due to gastroparesis (Multiple ED visits in the past for intractable N/V). 19. Nasal congestion- ordered Flonase 1 sprays daily.  20.  Developing severe spasticity RLE>RUE   7/8-tizanidine just increased 7/7--obsv  today 21.  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  Will continue n.p.o. status and have dietary reinsert feeding tube.  We will consult interventional radiology to assess for percutaneous gastrostomy tube placement I discussed this over the phone with the patient's sister Cameron Proud [phone].  She is in favor of placement if needed, given improvemnet over the last 24h after diet upgrade can hold off on scheduling, not even sure if anatomy would allow PEG,  Will d/c IVF , recheck BMET on Friday  LOS: 8 days A FACE TO FACE EVALUATION WAS PERFORMED  Erick Colace 07/05/2023, 9:25 AM

## 2023-07-05 NOTE — Patient Care Conference (Signed)
Inpatient RehabilitationTeam Conference and Plan of Care Update Date: 07/05/2023   Time: 10:20 AM    Patient Name: Zachary Winters      Medical Record Number: [id number]  Date of Birth: 02-02-1981 Sex: Male         Room/Bed: 4W13C/4W13C-01 Payor Info: Payor: [place] MEDICAID PREPAID HEALTH PLAN / Plan: [place] MEDICAID HEALTHY BLUE / Product Type: *No Product type* /    Admit Date/Time:  06/27/2023  4:33 PM  Primary Diagnosis:  Acute ischemic left middle cerebral artery (MCA) stroke [facility])  Hospital Problems: Principal Problem:   Acute ischemic left middle cerebral artery (MCA) stroke [facility])    Expected Discharge Date: Expected Discharge Date: 07/25/23  Team Members Present: Physician leading conference: Dr. Claudette Laws Social Worker Present: Lavera Guise, BSW Nurse Present: Chana Bode, RN PT Present: Ralph Leyden, PT OT Present: Primitivo Gauze, OT SLP Present: Feliberto Gottron, SLP PPS Coordinator present : Fae Pippin, SLP     Current Status/Progress Goal Weekly Team Focus  Bowel/Bladder   Patient incontinent of B & B, LBM 7/9   Patient to regain continence of Bowel and bladder   Assess toileting q shift and as needed and provided timed toileting    Swallow/Nutrition/ Hydration   Repeat MBS 7/9: Upgraded to Dys 2 textures and thin liquids via 10cc Provale cup. Mod A for use of swallowing strategies   Min A  diet tolerance monitoring; improving oral clearance; improving oral intake    ADL's   total A for bed level ADLs, MAX A for squat pivot transfers. has been self limiting lately needing MAX encouragment   MIN- MOD goals   functional transfers, sitting balanace, BADL reeducation, RUE PROM/NMR, family ed    Mobility   bed mobility = MaxA; squat pivot = maxA, sit<>stand = Mod/ MaxA with LUE support, ambulation = Max/ TotA   overall Min/ ModA, supervision for sitting balance, bed mobility CGA  Barriers: reduced balance, reduced motor planning/ control /// Work On: R  hemibody NMR, sitting/ standing balance, midline orientation, increased facilitation of RLE in transfers/ gait.    Communication   Overall Max A for functional communication due to expressive, receptive aphasia and apraxia of speech   Min A   Y/N questions, functional naming, functional communication board or gestures    Safety/Cognition/ Behavioral Observations               Pain   Facial grimaces and gestures for pain, prn Tylenol as needed   pain < or =2   Assess pain q shift and as needed    Skin   Skin is intact   Remain with good skin integrity and free of infection  Assess skin q shift and as needed      Discharge Planning:  Discharging home with sister and step father to assist. Grandmother able to provide supervision. 1 level home, leve; entry.   Team Discussion: Patient post left MCA CVA with emotional lability as he is aware of deficits. Little physical progress noted with poor postural control and motor control deficits making it difficult to maintain his balance. Doing better with swallowing and diet upgrade.  Patient on target to meet rehab goals: Currently PEG placement on hold post MBS and diet upgrade to D2 texture with thin liquids by Provale cup; encouraging increased po intake. Needs close supervision for meals to adhere to swallowing strategies. Needs total assist for care at bed level and completes squat pivots with +2 assist.  Max assist for  mobility and total assist for ambulation due to balance. Goals for discharge set for min - mod assist overall.  *See Care Plan and progress notes for long and short-term goals.   Revisions to Treatment Plan:  MBS 07/04/23   Teaching Needs: Safety, medications, dietary modifications, transfers, toileting, etc.   Current Barriers to Discharge: Decreased caregiver support and Home enviroment access/layout  Possible Resolutions to Barriers: Family education     Medical Summary Current Status: uncontrolledHTN,   Barriers to Discharge: Incontinence;Pending surgery/plan;Medical stability;Inadequate Nutritional Intake   Possible Resolutions to [employer] Focus: Consult IR for PEG   Continued Need for Acute Rehabilitation Level of Care: The patient requires daily medical management by a physician with specialized training in physical medicine and rehabilitation for the following reasons: Direction of a multidisciplinary physical rehabilitation program to maximize functional independence : Yes Medical management of patient stability for increased activity during participation in an intensive rehabilitation regime.: Yes Analysis of laboratory values and/or radiology reports with any subsequent need for medication adjustment and/or medical intervention. : Yes   I attest that I was present, lead the team conference, and concur with the assessment and plan of the team.   Chana Bode B 07/05/2023, 2:21 PM

## 2023-07-05 NOTE — Progress Notes (Signed)
Occupational Therapy Weekly Progress Note  Patient Details  Name: Zachary Winters MRN: [id number] Date of Birth: [birth year]  Beginning of progress report period: June 28, 2023 End of progress report period: July 05, 2023  Today's Date: 07/05/2023  Patient has met 3 of 4 short term goals. Pt currently requires total A for all ADLs from bed level and MAX A +2 for squat pivot transfers to R side. Pt with decreased ability to weight shift anteriorly to power into sit>stands. Pt is able to sit EOM with CGA. Pts overall participation has improved since eval with pt seeming to be more motivated to improve.   Patient continues to demonstrate the following deficits: decreased cardiorespiratoy endurance, abnormal tone and motor apraxia, decreased visual acuity, decreased attention to right, decreased initiation, decreased safety awareness, and delayed processing, and decreased sitting balance, decreased postural control, and hemiplegia and therefore will continue to benefit from skilled OT intervention to enhance overall performance with BADL.  Patient progressing toward long term goals..  Continue plan of care.  OT Short Term Goals Week 1:  OT Short Term Goal 1 (Week 1): pt will be able to hold static sit balance with CGA to enable him to sit safely on a BSC. OT Short Term Goal 1 - Progress (Week 1): Met OT Short Term Goal 2 (Week 1): Pt will be able to sit to stand in a stedy lift with mod A to enable a safe transfer to a BSC. OT Short Term Goal 2 - Progress (Week 1): Met OT Short Term Goal 3 (Week 1): Pt will demonstrate improved R side attention by completing RUE self ROM to prevent contractures due to hypertone, OT Short Term Goal 3 - Progress (Week 1): Progressing toward goal OT Short Term Goal 4 (Week 1): Pt will sit to stand with max A to enable care givers to pull clothing up over hips. OT Short Term Goal 4 - Progress (Week 1): Met Week 2:  OT Short Term Goal 1 (Week 2): pt will complete squat  pivot transfers with MOD A +2  as precursor to safe toilet transfers OT Short Term Goal 2 (Week 2): pt will complete sit>stands with MODA +2 to improve pts ability to sit>stand for LB dressing OT Short Term Goal 3 (Week 2): pt will sit EOB with CGA for > 5 mins as precursor to higher level ADLs from EOB    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, honey thick liquids only Restrictions Weight Bearing Restrictions: No    Therapy/Group: Individual Therapy  Pollyann Glen [facility] 07/05/2023, 12:25 PM

## 2023-07-06 LAB — GLUCOSE, CAPILLARY
Glucose-Capillary: 125 mg/dL — ABNORMAL HIGH (ref 70–99)
Glucose-Capillary: 120 mg/dL — ABNORMAL HIGH (ref 70–99)
Glucose-Capillary: 207 mg/dL — ABNORMAL HIGH (ref 70–99)
Glucose-Capillary: 192 mg/dL — ABNORMAL HIGH (ref 70–99)

## 2023-07-06 MED ORDER — ENSURE MAX PROTEIN PO LIQD
11.0000 [oz_av] | Freq: Three times a day (TID) | ORAL | Status: DC
  Administered 2023-07-06 – 2023-07-10 (×6): 11 [oz_av] via ORAL

## 2023-07-06 MED ORDER — BETHANECHOL CHLORIDE 10 MG PO TABS
10.0000 mg | ORAL_TABLET | Freq: Four times a day (QID) | ORAL | Status: DC
  Administered 2023-07-06 – 2023-07-25 (×73): 10 mg via ORAL
  Filled 2023-07-06 (×73): qty 1

## 2023-07-06 MED ORDER — INSULIN ASPART 100 UNIT/ML IJ SOLN
0.0000 [IU] | Freq: Every day | INTRAMUSCULAR | Status: DC
  Administered 2023-07-07: 3 [IU] via SUBCUTANEOUS
  Administered 2023-07-08: 2 [IU] via SUBCUTANEOUS
  Administered 2023-07-12: 3 [IU] via SUBCUTANEOUS
  Administered 2023-07-13 – 2023-07-16 (×2): 2 [IU] via SUBCUTANEOUS
  Administered 2023-07-20: 3 [IU] via SUBCUTANEOUS
  Administered 2023-07-21: 4 [IU] via SUBCUTANEOUS

## 2023-07-06 MED ORDER — INSULIN ASPART 100 UNIT/ML IJ SOLN
0.0000 [IU] | Freq: Three times a day (TID) | INTRAMUSCULAR | Status: DC
  Administered 2023-07-06 – 2023-07-07 (×2): 5 [IU] via SUBCUTANEOUS
  Administered 2023-07-07 (×2): 3 [IU] via SUBCUTANEOUS
  Administered 2023-07-08: 5 [IU] via SUBCUTANEOUS
  Administered 2023-07-08 (×2): 3 [IU] via SUBCUTANEOUS
  Administered 2023-07-09 (×2): 2 [IU] via SUBCUTANEOUS
  Administered 2023-07-09 – 2023-07-11 (×5): 3 [IU] via SUBCUTANEOUS
  Administered 2023-07-11: 1 [IU] via SUBCUTANEOUS
  Administered 2023-07-11 – 2023-07-12 (×3): 5 [IU] via SUBCUTANEOUS
  Administered 2023-07-12 – 2023-07-13 (×2): 2 [IU] via SUBCUTANEOUS
  Administered 2023-07-13: 5 [IU] via SUBCUTANEOUS
  Administered 2023-07-13: 8 [IU] via SUBCUTANEOUS
  Administered 2023-07-14: 3 [IU] via SUBCUTANEOUS
  Administered 2023-07-14: 2 [IU] via SUBCUTANEOUS
  Administered 2023-07-15: 5 [IU] via SUBCUTANEOUS
  Administered 2023-07-15: 1 [IU] via SUBCUTANEOUS
  Administered 2023-07-16: 2 [IU] via SUBCUTANEOUS
  Administered 2023-07-16: 3 [IU] via SUBCUTANEOUS
  Administered 2023-07-16 – 2023-07-17 (×2): 2 [IU] via SUBCUTANEOUS
  Administered 2023-07-17: 3 [IU] via SUBCUTANEOUS
  Administered 2023-07-17 – 2023-07-18 (×4): 2 [IU] via SUBCUTANEOUS
  Administered 2023-07-19: 5 [IU] via SUBCUTANEOUS
  Administered 2023-07-19: 3 [IU] via SUBCUTANEOUS
  Administered 2023-07-20: 2 [IU] via SUBCUTANEOUS
  Administered 2023-07-20: 3 [IU] via SUBCUTANEOUS
  Administered 2023-07-21: 5 [IU] via SUBCUTANEOUS
  Administered 2023-07-21 – 2023-07-22 (×3): 2 [IU] via SUBCUTANEOUS
  Administered 2023-07-22: 3 [IU] via SUBCUTANEOUS
  Administered 2023-07-23: 2 [IU] via SUBCUTANEOUS
  Administered 2023-07-23: 3 [IU] via SUBCUTANEOUS
  Administered 2023-07-24: 2 [IU] via SUBCUTANEOUS
  Administered 2023-07-24: 3 [IU] via SUBCUTANEOUS

## 2023-07-06 MED ORDER — LIDOCAINE HCL URETHRAL/MUCOSAL 2 % EX GEL: CUTANEOUS | Status: DC | PRN

## 2023-07-06 NOTE — Progress Notes (Signed)
Occupational Therapy Note  Patient Details  Name: Zachary Winters MRN: [id number] Date of Birth: 1981-12-07  Today's Date: 07/06/2023 OT Missed Time: 75 Minutes Missed Time Reason: Patient unwilling/refused to participate without medical reason  Pt greeted supine in bed, pt declining session pulling legs back into bed when therapist trying to initiate mobility, pt pulling blankets back up and nodding "no" repeatedly when attempting to motivate pt. Will continue efforts as time allows.   Pollyann Glen [facility] 07/06/2023, 1:28 PM

## 2023-07-06 NOTE — Progress Notes (Signed)
Physical Therapy Session Note  Patient Details  Name: Zachary Winters MRN: [id number] Date of Birth: 21-Jun-1981  Today's Date: 07/06/2023 Today's Date: 07/06/2023 PT Missed Time: 45 Minutes Missed Time Reason: Other (Comment) (pt refusal to participate)     Short Term Goals: Week 1:  PT Short Term Goal 1 (Week 1): Pt will perform bed mobility at overall ModA. PT Short Term Goal 2 (Week 1): Pt will perform sit<>stand with overall maxA +1 PT Short Term Goal 3 (Week 1): pt will maintain sitting balance to midline with modA +1 for at least . PT Short Term Goal 4 (Week 1): pt will perform squat pivot transfer with MaxA +1 PT Short Term Goal 5 (Week 1): Pt will complete PASS for outcome measure.  Pt missed 45 min of skilled therapy due to pt refusal. Pt supine in bed upon arrival. PTA and rehab tech present with pt initially shaking head "no" regarding participation in PT. PTA encouraged pt to attempt to perform PT in order to increase physical function. Pt still nodding "no" and presented with increased sadness about current presentation. When asked if pt doesn't want to be here and wants to go home, pt nodded "yes." PTA consoled pt in current state to increase pt connection and morale with encouragement for pt to not only push to get better for family, but most importantly their self. Pt still nodding "no" throughout 20 min of PTA attempt to initiate PT session. PTA informed pt of current missed minutes for the week (53 min), and that although it is entirely understandable of pt's current presentation, weekly therapy minutes must be met. Pt still refusing and pulled blanket over face. PTA encouraged pt to participate in other therapy sessions scheduled in the afternoon (regarding OT) but pt again nodding "no." Rehabilitation supervisor made aware.        Therapy Documentation Precautions:  Precautions Precautions: Fall Precaution Comments: dense R hemi with flexor tone, expressive>receptive  aphasia, honey thick liquids only Restrictions Weight Bearing Restrictions: No  Therapy/Group: Individual Therapy  Malya Cirillo PTA 07/06/2023, 7:51 AM

## 2023-07-06 NOTE — Progress Notes (Signed)
Speech Language Pathology Daily Session Note  Patient Details  Name: Zachary Winters MRN: [id number] Date of Birth: [birth year]  Today's Date: 07/06/2023 SLP Individual Time: 0815-0900 SLP Individual Time Calculation (min): 45 min  Short Term Goals: Week 2: SLP Short Term Goal 1 (Week 2): Pt will tolerate Dys 2 diet and thin liquids via Provale cup with minimal s/s of aspiration and no indications of development of aspiration pneumonia. SLP Short Term Goal 2 (Week 2): Pt will answer basic Y/N questions with >90% accuracy given min cues. SLP Short Term Goal 3 (Week 2): Pt will follow 1-step commands with >75% accuracy given min cues. SLP Short Term Goal 4 (Week 2): Pt will utilize compensatory swallow strategies to reduced or clear oral residue as needed with min assistance.  Skilled Therapeutic Interventions:   Pt greeted in bed, awake/alert upon SLP arrival. SLP attempted to assist pt with completion of morning meal, though he refused all options on his tray. Pt agreeable to trial diced peaches w/ SLP. Ginger ale also provided via provale cup. He required frequent visual/verbal/tactile cueing to clear R buccal cavity and demonstrated no sensation of the retained bolus. Pt required SLP assist to monitor rate of intake, as he continuously attempted to scoop more bites while his buccal cavity remained filled. Mirror provided to assist w/ buccal clearance/feedback, though pt noted to become increasingly frustrated; he ended up expectorating his final bites and looking away from the mirror. PO trials discontinued 2* pt frustration and moderate to severe buccal pocketing. Pt able to nod "yes" to frustration and "speech" "eating" as a cause, though unable to gather additional information via Y/N questions. Tx session stopped as pt continued to push away/look away from SLP and stopped responding to SLPs questions. Pt left in bed with bed alarm set; call light within reach. Recommend cont ST per POC.    Pain  Answered "no" to pain via head shake.   Therapy/Group: Individual Therapy  [name] 07/06/2023, 12:20 PM

## 2023-07-06 NOTE — Discharge Instructions (Addendum)
Inpatient Rehab Discharge Instructions  Zachary Winters Discharge date and time:  07/25/23  Activities/Precautions/ Functional Status: Activity: no lifting, driving, or strenuous exercise till cleared by MD Diet: cardiac diet and diabetic diet--soft foods. NO STRAWS.  Wound Care: none needed   Functional status:  ___ No restrictions     ___ Walk up steps independently _X__ 24/7 supervision/assistance   ___ Walk up steps with assistance ___ Intermittent supervision/assistance  ___ Bathe/dress independently ___ Walk with walker     _X__ Bathe/dress with assistance ___ Walk Independently    ___ Shower independently ___ Walk with assistance    ___ Shower with assistance _X__ No alcohol     ___ Return to work/school ________  COMMUNITY REFERRALS UPON DISCHARGE:    Home Health:   PT     OT     ST                  Agency: [facility] Phone: [phone]    Medical Equipment/Items Ordered: Hospital Bed, Wheelchair, Transfer Board, Hemi Walker, Drop Arm Commode                                                 Agency/Supplier: [employer] [phone]    Special Instructions: Monitor blood sugars before meals and at bedtime. Take a record of it to the MD office visit.  2.  Needs to eat on time--cannot skip meals. Encourage fluid intake. If he does not want to eat; can offer him a supplement like glucerna or other low carb brands.     STROKE/TIA DISCHARGE INSTRUCTIONS SMOKING Cigarette smoking nearly doubles your risk of having a stroke & is the single most alterable risk factor  If you smoke or have smoked in the last 12 months, you are advised to quit smoking for your health. Most of the excess cardiovascular risk related to smoking disappears within a year of stopping. Ask you doctor about anti-smoking medications [place] Quit Line: 1-800-QUIT NOW Free Smoking Cessation Classes [phone]  CHOLESTEROL Know your levels; limit fat & cholesterol in your diet  Lipid Panel     Component Value  Date/Time   CHOL 352 (H) 06/14/2023 0911   TRIG 205 (H) 06/14/2023 0911   HDL 46 06/14/2023 0911   CHOLHDL 7.7 06/14/2023 0911   VLDL 41 (H) 06/14/2023 0911   LDLCALC 265 (H) 06/14/2023 0911   LDLCALC 194 (H) 10/31/2019 0954     Many patients benefit from treatment even if their cholesterol is at goal. Goal: Total Cholesterol (CHOL) less than 160 Goal:  Triglycerides (TRIG) less than 150 Goal:  HDL greater than 40 Goal:  LDL (LDLCALC) less than 100   BLOOD PRESSURE American Stroke Association blood pressure target is less that 120/80 mm/Hg  Your discharge blood pressure is:  BP: 123/88 Monitor your blood pressure Limit your salt and alcohol intake Many individuals will require more than one medication for high blood pressure  DIABETES (A1c is a blood sugar average for last 3 months) Goal HGBA1c is under 7% (HBGA1c is blood sugar average for last 3 months)  Diabetes:     Lab Results  Component Value Date   HGBA1C 11.7 (H) 06/14/2023    Your HGBA1c can be lowered with medications, healthy diet, and exercise. Check your blood sugar as directed by your physician Call your physician if you experience unexplained  or low blood sugars.  PHYSICAL ACTIVITY/REHABILITATION Goal is 30 minutes at least 4 days per week  Activity: No driving, Therapies: see above Return to work: N/A Activity decreases your risk of heart attack and stroke and makes your heart stronger.  It helps control your weight and blood pressure; helps you relax and can improve your mood. Participate in a regular exercise program. Talk with your doctor about the best form of exercise for you (dancing, walking, swimming, cycling).  DIET/WEIGHT Goal is to maintain a healthy weight  Your discharge diet is:  Diet Order             DIET DYS 2 Room service appropriate? Yes; Fluid consistency: Thin  Diet effective now                   liquids Your height is:  Height: 6\' 1"  (185.4 cm) Your current weight is: Weight:  83 kg Your Body Mass Index (BMI) is:  BMI (Calculated): 24.15 Following the type of diet specifically designed for you will help prevent another stroke. Your goal weight range is:   Your goal Body Mass Index (BMI) is 19-24. Healthy food habits can help reduce 3 risk factors for stroke:  High cholesterol, hypertension, and excess weight.  RESOURCES Stroke/Support Group:  Call [phone]   STROKE EDUCATION PROVIDED/REVIEWED AND GIVEN TO PATIENT Stroke warning signs and symptoms How to activate emergency medical system (call 911). Medications prescribed at discharge. Need for follow-up after discharge. Personal risk factors for stroke. Pneumonia vaccine given:  Flu vaccine given:  My questions have been answered, the writing is legible, and I understand these instructions.  I will adhere to these goals & educational materials that have been provided to me after my discharge from the hospital.     My questions have been answered and I understand these instructions. I will adhere to these goals and the provided educational materials after my discharge from the hospital.  Patient/Caregiver Signature _______________________________ Date __________  Clinician Signature _______________________________________ Date __________  Please bring this form and your medication list with you to all your follow-up doctor's appointments.

## 2023-07-06 NOTE — Progress Notes (Signed)
PROGRESS NOTE   Subjective/Complaints:  Having difficulty with D3 trials tolerating pured and D to consistencies better.  Improving intake although dinner intake was low, 20% last night.  Discussed swallowing with speech therapy.  Patient is able to take his meds p.o. ROS: limited due to language/communication   Objective:   DG Swallowing Func-Speech Pathology  Result Date: 07/04/2023 Table formatting from the original result was not included. Modified Barium Swallow Study Patient Details Name: Zachary Winters MRN: [id number] Date of Birth: [birth year] Today's Date: 07/04/2023 HPI/PMH: HPI: Pt is a 42 year old male who presented with right-sided weakness, right-sided facial droop and difficulty speaking. 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 BSE 6/19 with minimal R anterior spillage, decreased coordination and R anterior spillage; dysphagia 2 diet with thin liquids recommended. 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 Similar petechial hemorrhage without mass occupying acute hemorrhage. New/interval small acute infarct in the right posterior limb of the internal capsule. MBS 6/24: mod/severe oral phase dysphagia and mild/mod pharyngeal phase dysphagia characterized by weak lingual movement and bolus manipulation with labial spillage, oral holding, and spillover to the pharynx. An NPO status recommended with snacks of puree and honey thick liquids. PMH: diabetes mellitus on insulin, chronic daily smoker, hypertension, history of marijuana use, diabetic peripheral neuropathy, hyperlipidemia Clinical Impression: Pt is a 42 year old male who presented with right-sided weakness, right-sided  facial droop and difficulty speaking. 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 BSE 6/19 with minimal R anterior spillage, decreased coordination and R anterior spillage; dysphagia 2 diet with thin liquids recommended. 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 Similar petechial hemorrhage without mass occupying acute hemorrhage. New/interval small acute infarct in the right posterior limb of the internal capsule. MBS 6/24: mod/severe oral phase dysphagia and mild/mod pharyngeal phase dysphagia characterized by weak lingual movement and bolus manipulation with labial spillage, oral holding, and spillover to the pharynx. An NPO status recommended with snacks of puree and honey thick liquids. PMH: diabetes mellitus on insulin, chronic daily smoker, hypertension, history of marijuana use, diabetic peripheral neuropathy, hyperlipidemia   Clinical Impression  Pt presents with a moderate oral dysphagia and a mild pharyngeal dysphagia per MBSS completed today.  Oral deficits characterized by reduced oral strength and coordination, which resulted in anterior spillage of liquids from the R side, incomplete oral transit of liquids and solids, and posterior loss of all liquids to the level of the pyriform sinuses before the swallow. Pt required frequent cues to initiate subsequent dry swallow in effort to reduce moderate amount of oral residue.  Pharyngeal deficits characterized by reduced hyolaryngeal elevation/excursion, reduced base of tongue retraction, reduced larygneal vestibule closure, reduced pharyngeal stripping, and reduced distention of upper esophageal sphincter.  Findings -There was x1 instance of suspected aspiration of thin liquids after consecutive,  large swallows that were pt initiated by pt. Aspiration inferred due to significant coughing spell once fluoro was turned off. No e/o aspirated residue observed in the trachea once fluoro was turned back on. Suspect aspiration was either ejected or unable to be viewed on fluoro due to minimal amount. -There was inconsistent, trace, transient, shallow penetration of thin liquids by cup and straw sip before/during the swallow. -There was x1 instance of scant, transient, shallow penetration of nectar-thick liquid residue after the swallow. -Cued dry swallows and liquid washes were effective for reduced moderate oral residue and min-mod diffuse pharyngeal residue. Pt, otherwise, did not appeared aware or sensate of this residue.  Detailed diet recommendations and aspiration precautions outlined below. Pt's mother was updated with updated diet recommendations following the study.  Plan: SLP will continue to follow to assess diet tolerance and modify diet as indicated. No data recorded Factors that may increase risk of adverse event in presence of aspiration Zachary Winters & [name] 2021): Factors that may increase risk of adverse event in presence of aspiration Zachary Winters & [name] 2021): Limited mobility; Dependence for feeding and/or oral hygiene; Inadequate oral hygiene Recommendations/Plan: Swallowing Evaluation Recommendations Swallowing Evaluation Recommendations Recommendations: PO diet PO Diet Recommendation: Dysphagia 2 (Finely chopped); Thin liquids (Level 0) Liquid Administration via: Cup (10cc Provale cup) Medication Administration: Crushed with puree Supervision: Full supervision/cueing for swallowing strategies; Staff to assist with self-feeding Swallowing strategies  : Slow rate; Small bites/sips; Check for pocketing or oral holding; Check for anterior loss; Multiple dry swallows after each bite/sip; Follow solids with liquids Postural changes: Position pt fully upright for meals; Stay upright 30-60 min after meals  Oral care recommendations: Staff/trained caregiver to provide oral care; Oral care before ice chips/water Caregiver Recommendations: Have oral suction available Treatment Plan Treatment Plan Treatment recommendations: Therapy as outlined in treatment plan below Follow-up recommendations: Outpatient SLP Functional status assessment: Patient has not had a recent decline in their functional status. Treatment frequency: Min 3x/week Treatment duration: 2 weeks Interventions: Aspiration precaution training; Patient/family education; Trials of upgraded texture/liquids; Compensatory techniques; Diet toleration management by SLP Recommendations Recommendations for follow up therapy are one component of a multi-disciplinary discharge planning process, led by the attending physician.  Recommendations may be updated based on patient status, additional functional criteria and insurance authorization. Assessment: Orofacial Exam: Orofacial Exam Oral Cavity: Oral Hygiene: Lingual coating (on nystatin) Oral Cavity - Dentition: Adequate natural dentition Orofacial Anatomy: WFL Oral Motor/Sensory Function: Suspected cranial nerve impairment CN V - Trigeminal: Not tested CN VII - Facial: Right motor impairment CN IX - Glossopharyngeal, CN X - Vagus: Right motor impairment CN XII - Hypoglossal: Not tested Anatomy: No data recorded Boluses Administered: Boluses Administered Boluses Administered: Thin liquids (Level 0); Mildly thick liquids (Level 2, nectar thick); Moderately thick liquids (Level 3, honey thick); Puree; Solid  Oral Impairment Domain: Oral Impairment Domain Lip Closure: Escape progressing to mid-chin Tongue control during bolus hold: Posterior escape of greater than half of bolus Bolus preparation/mastication: Slow prolonged chewing/mashing with complete recollection Bolus transport/lingual motion: Repetitive/disorganized tongue motion Oral residue: Residue collection on oral structures Location of oral residue : Floor of  mouth; Tongue Initiation of pharyngeal swallow : Pyriform sinuses  Pharyngeal Impairment Domain: Pharyngeal Impairment Domain Soft palate elevation: No bolus between soft palate (SP)/pharyngeal wall (PW) Laryngeal elevation: Partial superior movement of thyroid cartilage/partial approximation of arytenoids to epiglottic petiole Anterior hyoid excursion: Partial anterior movement Epiglottic  movement: Complete inversion Laryngeal vestibule closure: Incomplete, narrow column air/contrast in laryngeal vestibule Pharyngeal stripping wave : Present - diminished Pharyngeal contraction (A/P view only): N/A Pharyngoesophageal segment opening: Partial distention/partial duration, partial obstruction of flow Tongue base retraction: Trace column of contrast or air between tongue base and PPW Pharyngeal residue: Collection of residue within or on pharyngeal structures Location of pharyngeal residue: Valleculae; Pyriform sinuses; Tongue base  Esophageal Impairment Domain: Esophageal Impairment Domain Esophageal clearance upright position: Complete clearance, esophageal coating Pill: No data recorded Penetration/Aspiration Scale Score: Penetration/Aspiration Scale Score 1.  Material does not enter airway: Puree; Moderately thick liquids (Level 3, honey thick); Thin liquids (Level 0); Mildly thick liquids (Level 2, nectar thick); Solid 2.  Material enters airway, remains ABOVE vocal cords then ejected out: Thin liquids (Level 0); Mildly thick liquids (Level 2, nectar thick) 3.  Material enters airway, remains ABOVE vocal cords and not ejected out: Thin liquids (Level 0) 6.  Material enters airway, passes BELOW cords then ejected out: Thin liquids (Level 0) (suspected based on cough response, though not viewed due to fluro off) Compensatory Strategies: Compensatory Strategies Compensatory strategies: Yes Multiple swallows: Effective Effective Multiple Swallows: Thin liquid (Level 0); Mildly thick liquid (Level 2, nectar thick);  Moderately thick liquid (Level 3, honey thick) (reduced residue) Liquid wash: Effective Effective Liquid Wash: Puree; Solid (reduced residue)   General Information: Caregiver present: No (SLP provided education with pt's mom at bedside)  Diet Prior to this Study: Dysphagia 1 (pureed); Moderately thick liquids (Level 3, honey thick)   Temperature : Normal   Respiratory Status: WFL   Supplemental O2: None (Room air)   History of Recent Intubation: No  Behavior/Cognition: Alert; Pleasant mood; Requires cueing; Impulsive Self-Feeding Abilities: Needs assist with self-feeding Baseline vocal quality/speech: Not observed Volitional Cough: Unable to elicit Volitional Swallow: Able to elicit (delayed with multiple cues) Exam Limitations: -- (fluro camera off during possible aspiration event) Goal Planning: Prognosis for improved oropharyngeal function: Fair Barriers to Reach Goals: Language deficits; Severity of deficits No data recorded Patient/Family Stated Goal: none stated Consulted and agree with results and recommendations: Nurse; Family member/caregiver; Nurse Tech Pain: Pain Assessment Pain Assessment: No/denies pain Faces Pain Scale: 0 SLP visit diagnosis: SLP Visit Diagnosis: Dysphagia, oropharyngeal phase (R13.12); Aphasia (R47.01); Dysarthria and anarthria (R47.1) Past Medical History: Past Medical History: Diagnosis Date  Nausea and vomiting   chronic for years.  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)   decreased vision--has been getting injections  Type 1 diabetes mellitus (HCC)   Diagnosed at age 49 (has been on insulin every since dx) Past Surgical History: Past Surgical History: Procedure Laterality Date  TEE WITHOUT CARDIOVERSION N/A 06/16/2023  Procedure: TRANSESOPHAGEAL ECHOCARDIOGRAM (TEE);  Surgeon: Jonelle Sidle, MD;  Location: [facility];  Service: Cardiovascular;  Laterality: N/A; Ellery Plunk 07/04/2023, 12:02 PM  Recent Labs     07/03/23 0923  WBC 9.9  HGB 11.0*  HCT 32.4*  PLT 361   Recent Labs    07/03/23 0923 07/04/23 0537  NA 135 137  K 4.2 4.2  CL 99 103  CO2 26 26  GLUCOSE 178* 167*  BUN 19 17  CREATININE 1.60* 1.39*  CALCIUM 8.8* 8.8*    Intake/Output Summary (Last 24 hours) at 07/06/2023 0834 Last data filed at 07/05/2023 2030 Gross per 24 hour  Intake 356 ml  Output --  Net 356 ml  Physical Exam: Vital Signs Blood pressure (!) 127/91, pulse 93, temperature 98.6 F (37 C), temperature source Oral, resp. rate 16, height 6\' 1"  (1.854 m), weight 83 kg, SpO2 100%.  General: No acute distress Mood and affect are appropriate Heart: Regular rate and rhythm no rubs murmurs or extra sounds Lungs: Clear to auscultation, breathing unlabored, no rales or wheezes Abdomen: Positive bowel sounds, soft nontender to palpation, nondistended Extremities: No clubbing, cyanosis, or edema  Skin: No evidence of breakdown, no evidence of rash Neurologic: Aphasic but communicates with head nods, y/n. Cranial nerves II through XII intact, motor strength is 5/5 in left and 0/5 right  deltoid, bicep, tricep, grip, hip flexor, knee extensors, ankle dorsiflexor and plantar flexor RUE extension with yawn  Flexor withdrawal spasticity remains severe RLE, MAS 3 in Left elbow flexors Sensory exam reduced pinch sensation on RIght   Musculoskeletal: Full range of motion in all 4 extremities. No joint swelling  Assessment/Plan: 1. Functional deficits which require 3+ hours per day of interdisciplinary therapy in a comprehensive inpatient rehab setting. Physiatrist is providing close team supervision and 24 hour management of active medical problems listed below. Physiatrist and rehab team continue to assess barriers to discharge/monitor patient progress toward functional and medical goals  Care Tool:  Bathing        Body parts bathed by helper: Right arm, Left arm, Chest, Abdomen, Front perineal area,  Buttocks, Right upper leg, Left upper leg, Face, Left lower leg, Right lower leg     Bathing assist Assist Level: Dependent - Patient 0%     Upper Body Dressing/Undressing Upper body dressing   What is the patient wearing?: Pull over shirt    Upper body assist Assist Level: Total Assistance - Patient < 25%    Lower Body Dressing/Undressing Lower body dressing      What is the patient wearing?: Underwear/pull up, Incontinence brief     Lower body assist Assist for lower body dressing: Dependent - Patient 0%     Toileting Toileting    Toileting assist Assist for toileting: Dependent - Patient 0%     Transfers Chair/bed transfer  Transfers assist  Chair/bed transfer activity did not occur: Safety/medical concerns  Chair/bed transfer assist level: Dependent - mechanical lift (STEDY)     Locomotion Ambulation   Ambulation assist      Assist level: Maximal Assistance - Patient 25 - 49% Assistive device: Other (comment) (L hallway handrail) Max distance: 35 ft   Walk 10 feet activity   Assist     Assist level: Maximal Assistance - Patient 25 - 49% Assistive device: Other (comment) (L hallway hand rail)   Walk 50 feet activity   Assist Walk 50 feet with 2 turns activity did not occur: Safety/medical concerns         Walk 150 feet activity   Assist Walk 150 feet activity did not occur: Safety/medical concerns         Walk 10 feet on uneven surface  activity   Assist Walk 10 feet on uneven surfaces activity did not occur: Safety/medical concerns         Wheelchair     Assist Is the patient using a wheelchair?: Yes Type of Wheelchair: Manual    Wheelchair assist level: Dependent - Patient 0% Max wheelchair distance: 150 ft    Wheelchair 50 feet with 2 turns activity    Assist        Assist Level: Dependent - Patient 0%   Wheelchair 150  feet activity     Assist      Assist Level: Dependent - Patient 0%    Blood pressure (!) 127/91, pulse 93, temperature 98.6 F (37 C), temperature source Oral, resp. rate 16, height 6\' 1"  (1.854 m), weight 83 kg, SpO2 100%.  Medical Problem List and Plan: 1. 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 Severe Left M1 and V4 stenosis              -patient may  shower             -ELOS/Goals: 7/30 min A for PT, OT and SLP   -Continue CIR therapies including PT, OT, and SLP   2.  Antithrombotics: -DVT/anticoagulation:  Pharmaceutical: Lovenox, LE doppler neg              -antiplatelet therapy: DAPT for at leasat  3 months. Has declined stenting.   3. Pain Management: Tylenol prn.  4. Mood/Behavior/Sleep: LCSW to follow   for evaluation and support when appropriate.              -antipsychotic agents: N/A 5. Neuropsych/cognition: This patient is not capable of making decisions on his own behalf. 6. Skin/Wound Care: Routine pressure relief measures.  7. Fluids/Electrolytes/Nutrition:   Ate 85% breakfast , 65% lunch but only 20% dinner ,  Fluid intake improved but only per day x 2 d, need to encourage   Hold off on feeding tube 8. 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 HTN: Monitor BP TID--long term goal normotensive             --continue amlodipine 10 mg/day and coreg  6.25 mg BID.  Vitals:   07/05/23 1917 07/06/23 0530  BP: 129/83 (!) 127/91  Pulse: 93   Resp: 16 16  Temp: 99 F (37.2 C) 98.6 F (37 C)  SpO2: 98% 100%   Keep in 140-160 sys range 7/10 in range although diastolic creeping up will d/c IVF now that pt is eating and drinking better per SLP  10. T2DM w/peripheral neuropathy: Hgb A1c- 11.7. Was on Lantus 30 units PTA?  --Continue Insulin glargline-->was decreased to  5 units/HS today(to start tomorrow as received 10 units this am).   --Hold every 4 hours novolog as not on TF at  this moment.  CBG (last 3)  Recent Labs    07/05/23 1634 07/05/23 2137 07/06/23 0612  GLUCAP 143* 190* 125*   Increased Semglee to 15U 7/7 Change SSI to moderate  7/10- controlled  11. Acute on chronic renal failure?:  Improved with IVF for hydration-->was d/c yesterday but will resume with questionable intake --.SCr 1.02 in 09/22 per chart review. SCr 1.57 @ admission-->1.29-->1.31  PO4 and Mg ok 7/7 7/8 Cr sl elevated close to admission level, IVF as above-recheck later this week. 12. Severe dysphagia: Continue D1, honey liquids. Willing to take liquids-->will offer thickened Glucerna with meals for now.         13. Hyperlipidemia: LDL 265. On Lipitor 80 mg daily. 14. Severe vitamin D deficiency: On Ergocalciferol weekly with dialy low supplement.  15. ABLA: Hgb 13.1 @ admission-->11.1-->9.2. Will order stool guaiacs- none collected 7/5.              --Senna added at bedtime as last BM 06/29.   7/8-stool guaiac negative 7/7     -hgb up to 11.0 16. Low grade  fevers: Encourage pulmonary hygiene.  17. Hx tobacco/Marijuana use: Continue  nicotine patch.  Encourage cessation of Marijuana.  18. Intermittent episodes of N/V: Question due to gastroparesis (Multiple ED visits in the past for intractable N/V). 19. Nasal congestion- ordered Flonase 1 sprays daily.  20.  Developing severe spasticity RLE>RUE   7/8-tizanidine just increased 7/7--obsv today 21.  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  Will continue n.p.o. status and have dietary reinsert feeding tube.  We will consult interventional radiology to assess for percutaneous gastrostomy tube placement I discussed this over the phone with the patient's sister Zachary Winters [phone].  She is in favor of placement if needed, given improvemnet over the last 24h after diet upgrade can hold off on scheduling, not even sure if anatomy would allow PEG,  Will d/c IVF , recheck BMET on Friday  LOS: 9 days A FACE  TO FACE EVALUATION WAS PERFORMED  Erick Colace 07/06/2023, 8:34 AM

## 2023-07-06 NOTE — Progress Notes (Signed)
Physical Therapy Session Note  Patient Details  Name: Zachary Winters MRN: [id number] Date of Birth: 06-Apr-1981  Today's Date: 07/06/2023 PT Individual Time: 1500-1530 PT Individual Time Calculation (min): 30 min   Short Term Goals: Week 1:  PT Short Term Goal 1 (Week 1): Pt will perform bed mobility at overall ModA. PT Short Term Goal 2 (Week 1): Pt will perform sit<>stand with overall maxA +1 PT Short Term Goal 3 (Week 1): pt will maintain sitting balance to midline with modA +1 for at least . PT Short Term Goal 4 (Week 1): pt will perform squat pivot transfer with MaxA +1 PT Short Term Goal 5 (Week 1): Pt will complete PASS for outcome measure.  Skilled Therapeutic Interventions/Progress Updates: Pt presents semi-reclined in bed and shaking head emphatically that he does not want to participate in PT.  PT encouraged and along w/ family member, pt reluctantly agreeable to therapy.  Pt required mod A for sup to sit, bringing LLE off EOB, crossing over R, PT assist to bring RLE.  Pt sat EOB w/ initial balance at mod A, pt reaching for foot board and mattress.  Eventually pt sitting w/ L hand on knee and facilitation for WB through RUE.  Pt does respond to lean to left.  Pt performed reaching for ceiling 2 x 10 w/ cues for maintaining balance., occasional LOB to R and posterior.  Pt performed sit to stand x 3 w/ Total A and L hand on PT shoulder/elbow otherwise pushes too much., requires  blocking of R knee.  Pt stood x 1-2' w/ weight shifts.  Pt transferred sit to supine w/ mod A bringing LLE onto bed and A for RLE.  Pt able to scoot to [facility] w/ L hooklying position and pulling w/ L UE.  Family still present in room.     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, honey thick liquids only Restrictions Weight Bearing Restrictions: No General:   Vital Signs: Therapy Vitals Temp: 98.9  F (37.2 C) Pulse Rate: 100 Resp: 16 BP: 123/88 Patient Position (if appropriate): Lying Oxygen Therapy SpO2: 100 % O2 Device: Room Air Pain:unable to verbalize, but eventually shakes head to " does it hurt?" Pain Assessment Pain Scale: Faces Faces Pain Scale: Hurts a little bit Pain Location: Head Pain Intervention(s): Medication (See eMAR)    Therapy/Group: Individual Therapy  Lucio Edward 07/06/2023, 3:48 PM

## 2023-07-07 DIAGNOSIS — Z87898 Personal history of other specified conditions: Secondary | ICD-10-CM

## 2023-07-07 LAB — BASIC METABOLIC PANEL
Anion gap: 9 (ref 5–15)
BUN: 15 mg/dL (ref 6–20)
CO2: 23 mmol/L (ref 22–32)
Calcium: 8.8 mg/dL — ABNORMAL LOW (ref 8.9–10.3)
Chloride: 103 mmol/L (ref 98–111)
Creatinine, Ser: 1.54 mg/dL — ABNORMAL HIGH (ref 0.61–1.24)
GFR, Estimated: 58 mL/min — ABNORMAL LOW (ref >60)
Glucose, Bld: 187 mg/dL — ABNORMAL HIGH (ref 70–99)
Potassium: 4.5 mmol/L (ref 3.5–5.1)
Sodium: 135 mmol/L (ref 135–145)

## 2023-07-07 LAB — GLUCOSE, CAPILLARY
Glucose-Capillary: 174 mg/dL — ABNORMAL HIGH (ref 70–99)
Glucose-Capillary: 176 mg/dL — ABNORMAL HIGH (ref 70–99)
Glucose-Capillary: 205 mg/dL — ABNORMAL HIGH (ref 70–99)
Glucose-Capillary: 262 mg/dL — ABNORMAL HIGH (ref 70–99)

## 2023-07-07 LAB — FACTOR 5 LEIDEN

## 2023-07-07 MED ORDER — INSULIN GLARGINE-YFGN 100 UNIT/ML ~~LOC~~ SOLN
20.0000 [IU] | Freq: Every day | SUBCUTANEOUS | Status: DC
  Administered 2023-07-07 – 2023-07-08 (×2): 20 [IU] via SUBCUTANEOUS
  Filled 2023-07-07 (×3): qty 0.2

## 2023-07-07 MED ORDER — SODIUM CHLORIDE 0.45 % IV SOLN: INTRAVENOUS | Status: DC

## 2023-07-07 NOTE — Progress Notes (Signed)
Physical Therapy Weekly Progress Note  Patient Details  Name: Zachary Winters MRN: [id number] Date of Birth: Sep 01, 1981  Beginning of progress report period: June 28, 2023 End of progress report period: July 07, 2023  Patient has met 4 of 5 short term goals.  Pt has demonstrated increase in physical presentation since evaluations by decreasing level of assistance required while performing bed mobility tasks, sit to stands (currently at [facility] with use of L UE for push up), maintaining static sitting balance with use of L UE support on surface with supervision/CGA for safety, and performing squat pivots with maxA (pt still limited in initiating squat pivots with max cues to recall sequence). Pt has also completed PASS outcome measure, and has increased scoring from 7/36 the day after evaluation to 15/36. Pt is currently limited by R UE/LE hemiparesis, expressive aphasia, coordination, self-limitation and participation in therapy as pt has refused rehab services on several occasions; however, pt has increased participation since the few days after evaluation (Pt decreased motivation is seemingly due to current decreased physical presentation).  Patient continues to demonstrate the following deficits muscle weakness and muscle paralysis, impaired timing and sequencing, abnormal tone, ataxia, decreased coordination, and decreased motor planning, decreased midline orientation, decreased attention to right, right side neglect, and decreased motor planning, decreased attention, decreased awareness, decreased problem solving, decreased safety awareness, and delayed processing, and decreased sitting balance, decreased standing balance, decreased postural control, hemiplegia, and decreased balance strategies and therefore will continue to benefit from skilled PT intervention to increase functional independence with mobility.  Patient progressing toward long term goals..  Continue plan of care.  PT Short Term  Goals Week 1:  PT Short Term Goals - Week 1 PT Short Term Goal 1 (Week 1): Pt will perform bed mobility at overall ModA. PT Short Term Goal 1 - Progress (Week 1): Partly met PT Short Term Goal 2 (Week 1): Pt will perform sit<>stand with overall maxA +1 PT Short Term Goal 2 - Progress (Week 1): Met PT Short Term Goal 3 (Week 1): pt will maintain sitting balance to midline with modA +1 for at least . PT Short Term Goal 3 - Progress (Week 1): Met PT Short Term Goal 4 (Week 1): pt will perform squat pivot transfer with MaxA +1 PT Short Term Goal 4 - Progress (Week 1): Met PT Short Term Goal 5 (Week 1): Pt will complete PASS for outcome measure. PT Short Term Goal 5 - Progress (Week 1): Met PT Short Term Goals - Week 2 PT Short Term Goal 1 (Week 2): Pt will perform bed mobility at overall ModA. PT Short Term Goal 2 (Week 2): Pt will perform sit<>stand with overall ModA +1 PT Short Term Goal 3 (Week 2): pt will maintain sitting balance to midline with CGA +1 for at least . PT Short Term Goal 4 (Week 2): pt will perform squat pivot transfer with MinA +1  PT Treatment/ Interventions: Ambulation/gait training; Community reintegration; Fish farm manager; Neuromuscular re-education; Psychosocial support; Stair training; UE/LE Strength taining/ROM; Wheelchair propulsion/positioning; Warden/ranger; Discharge planning; Functional electrical stimulation; Pain management; Skin care/wound management; Therapeutic Activities; UE/LE Coordination activities; Cognitive remediation/compensation; Disease management/prevention; Functional mobility training; Patient/family education; Splinting/orthotics; Therapeutic Exercise; Visual/perceptual remediation/compensation    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, honey thick liquids only Restrictions Weight Bearing  Restrictions: No   Dominic Sandoval PTA 07/08/2023, 1:40 PM   Loel Dubonnet PT, DPT, CSRS 07/10/2023,  8:47 AM **late sign d/t miscommunication re: note.

## 2023-07-07 NOTE — Progress Notes (Signed)
Speech Language Pathology Daily Session Note  Patient Details  Name: Zachary Winters MRN: [id number] Date of Birth: Jan 09, 1981  Today's Date: 07/07/2023 SLP Individual Time: 0815-0850 SLP Individual Time Calculation (min): 35 min and Today's Date: 07/07/2023 SLP Missed Time: 10 Minutes Missed Time Reason:  scheduling conflict - MBSS ready early   Short Term Goals: Week 2: SLP Short Term Goal 1 (Week 2): Pt will tolerate Dys 2 diet and thin liquids via Provale cup with minimal s/s of aspiration and no indications of development of aspiration pneumonia. SLP Short Term Goal 2 (Week 2): Pt will answer basic Y/N questions with >90% accuracy given min cues. SLP Short Term Goal 3 (Week 2): Pt will follow 1-step commands with >75% accuracy given min cues. SLP Short Term Goal 4 (Week 2): Pt will utilize compensatory swallow strategies to reduced or clear oral residue as needed with min assistance.  Skilled Therapeutic Interventions:   Pt greeted at bedside, appeared in better spirits compared to yesterday as he greeted SLP with a slight smile and a thumbs up when asked how he was feeling today. SLP facilitated automatic speech tasks to target spontaneous responses. Introduced Tax adviser. Pt noted to unscramble 1-10 w/ spv verbal cues, dow w/ minA verbal cues, and months of the year with modA visual/verbal cues. Given approximations, he was able to verbalize 1-10 with minA and dow/months with maxA given multisyllabic words. SLP then facilitated phrase completion task w/ opposites or pairs and pt completed w/ modA visual/verbal cues. Majority of responses were approximations, though spontaneous "down" "no" and "out" noted. Throughout tx tasks, pt answered functional Y/N questions re wants/needs w/ spv visual/verbal cues required to ensure intended responses. Pt left in bed with bed alarm set. Call light within reach. Recommend cont ST per POC.   Pain Pain Assessment Pain Scale:  Faces Faces Pain Scale: No hurt  Therapy/Group: Individual Therapy  [name] 07/07/2023, 10:50 AM

## 2023-07-07 NOTE — Progress Notes (Signed)
Occupational Therapy Session Note  Patient Details  Name: Zachary Winters MRN: [id number] Date of Birth: 06-23-1981  Today's Date: 07/07/2023 OT Individual Time: 1105-1208 OT Individual Time Calculation (min): 63 min    Short Term Goals: Week 2:  OT Short Term Goal 1 (Week 2): pt will complete squat pivot transfers with MOD A +2  as precursor to safe toilet transfers OT Short Term Goal 2 (Week 2): pt will complete sit>stands with MODA +2 to improve pts ability to sit>stand for LB dressing OT Short Term Goal 3 (Week 2): pt will sit EOB with CGA for > 5 mins as precursor to higher level ADLs from EOB  Skilled Therapeutic Interventions/Progress Updates:    Pt received in bed ready for therapy.  Focus of therapy session on pt participation, balance, transfers.    Pt was wet and NT helped with changing brief. Mod cues to have pt use L hand to wash himself in groin region.  Brief and pants donned from bed.   Supine to sidelying to sit mod A.  Sit balance improved with mod A initially progressing to CGA with pt using L hand for support. Pt did very well following cues and with increased participation completed a smooth transfer in 2 sections bed to wc using a squat pivot with mod A.    Once in w/c positioned at sink to engage in a very thorough bathing routine washing his UB 3x as pt had a strong body odor.  Pt did assist bathing UB with mod A.  Prior to donning shirt had pt engage in oral care as his tongue has heavy build up. Pt able to use toothbrush with L hand but cannot spit out even with multiple cues.  Used suction to clear liquid from mouth.  Pt then donned shirt with mod A.  Attempted to have pt use communication board to write sons name but pt unable to. He also nodded yes when asked if it was difficult to see board due to vision deficits. Pt in wc with all needs met and belt alarm on.       Therapy Documentation Precautions:  Precautions Precautions: Fall Precaution Comments: dense  R hemi with flexor tone, expressive>receptive aphasia, honey thick liquids only Restrictions Weight Bearing Restrictions: No   Pain: Pain Assessment Pain Scale: Faces Faces Pain Scale: No hurt    Therapy/Group: Individual Therapy  Rhayne Chatwin 07/07/2023, 11:20 AM

## 2023-07-07 NOTE — Progress Notes (Signed)
PROGRESS NOTE   Subjective/Complaints:  Intake 50%,60%, 20% yesterday . Low fluid intake , increased creat (baseline ~1.3) Required cath  yesterday but then voided with residual <264mL ROS: limited due to language/communication   Objective:   No results found. No results for input(s): "WBC", "HGB", "HCT", "PLT" in the last 72 hours.  Recent Labs    07/07/23 0635  NA 135  K 4.5  CL 103  CO2 23  GLUCOSE 187*  BUN 15  CREATININE 1.54*  CALCIUM 8.8*    Intake/Output Summary (Last 24 hours) at 07/07/2023 1610 Last data filed at 07/07/2023 0802 Gross per 24 hour  Intake 354 ml  Output 857 ml  Net -503 ml        Physical Exam: Vital Signs Blood pressure 134/89, pulse (!) 103, temperature 98.8 F (37.1 C), resp. rate 19, height 6\' 1"  (1.854 m), weight 78.3 kg, SpO2 100%.  General: No acute distress Mood and affect are appropriate Heart: Regular rate and rhythm no rubs murmurs or extra sounds Lungs: Clear to auscultation, breathing unlabored, no rales or wheezes Abdomen: Positive bowel sounds, soft nontender to palpation, nondistended Extremities: No clubbing, cyanosis, or edema  Skin: No evidence of breakdown, no evidence of rash Neurologic: Aphasic but communicates with head nods, y/n. Cranial nerves II through XII intact, motor strength is 5/5 in left and 0/5 right  deltoid, bicep, tricep, grip, hip flexor, knee extensors, ankle dorsiflexor and plantar flexor RUE extension with yawn  Flexor withdrawal spasticity remains severe RLE, MAS 3 in Left elbow flexors Sensory exam reduced pinch sensation on RIght   Musculoskeletal: Full range of motion in all 4 extremities. No joint swelling  Assessment/Plan: 1. Functional deficits which require 3+ hours per day of interdisciplinary therapy in a comprehensive inpatient rehab setting. Physiatrist is providing close team supervision and 24 hour management of active  medical problems listed below. Physiatrist and rehab team continue to assess barriers to discharge/monitor patient progress toward functional and medical goals  Care Tool:  Bathing        Body parts bathed by helper: Right arm, Left arm, Chest, Abdomen, Front perineal area, Buttocks, Right upper leg, Left upper leg, Face, Left lower leg, Right lower leg     Bathing assist Assist Level: Dependent - Patient 0%     Upper Body Dressing/Undressing Upper body dressing   What is the patient wearing?: Pull over shirt    Upper body assist Assist Level: Total Assistance - Patient < 25%    Lower Body Dressing/Undressing Lower body dressing      What is the patient wearing?: Underwear/pull up, Incontinence brief     Lower body assist Assist for lower body dressing: Dependent - Patient 0%     Toileting Toileting    Toileting assist Assist for toileting: Dependent - Patient 0%     Transfers Chair/bed transfer  Transfers assist  Chair/bed transfer activity did not occur: Safety/medical concerns  Chair/bed transfer assist level: Dependent - mechanical lift (STEDY)     Locomotion Ambulation   Ambulation assist      Assist level: Maximal Assistance - Patient 25 - 49% Assistive device: Other (comment) (L hallway handrail) Max distance:  35 ft   Walk 10 feet activity   Assist     Assist level: Maximal Assistance - Patient 25 - 49% Assistive device: Other (comment) (L hallway hand rail)   Walk 50 feet activity   Assist Walk 50 feet with 2 turns activity did not occur: Safety/medical concerns         Walk 150 feet activity   Assist Walk 150 feet activity did not occur: Safety/medical concerns         Walk 10 feet on uneven surface  activity   Assist Walk 10 feet on uneven surfaces activity did not occur: Safety/medical concerns         Wheelchair     Assist Is the patient using a wheelchair?: Yes Type of Wheelchair: Manual    Wheelchair  assist level: Dependent - Patient 0% Max wheelchair distance: 150 ft    Wheelchair 50 feet with 2 turns activity    Assist        Assist Level: Dependent - Patient 0%   Wheelchair 150 feet activity     Assist      Assist Level: Dependent - Patient 0%   Blood pressure 134/89, pulse (!) 103, temperature 98.8 F (37.1 C), resp. rate 19, height 6\' 1"  (1.854 m), weight 78.3 kg, SpO2 100%.  Medical Problem List and Plan: 1. 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 Severe Left M1 and V4 stenosis              -patient may  shower             -ELOS/Goals: 7/30 min A for PT, OT and SLP   -Continue CIR therapies including PT, OT, and SLP   2.  Antithrombotics: -DVT/anticoagulation:  Pharmaceutical: Lovenox, LE doppler neg              -antiplatelet therapy: DAPT for at leasat  3 months. Has declined stenting.   3. Pain Management: Tylenol prn.  4. Mood/Behavior/Sleep: LCSW to follow   for evaluation and support when appropriate.              -antipsychotic agents: N/A 5. Neuropsych/cognition: This patient is not capable of making decisions on his own behalf. 6. Skin/Wound Care: Routine pressure relief measures.  7. Fluids/Electrolytes/Nutrition:   Ate 85% breakfast , 65% lunch but only 20% dinner ,  Fluid intake improved but only per day x 2 d, need to encourage   Hold off on feeding tube  IVF at noc ordered  8. 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 HTN: Monitor BP TID--long term goal normotensive             --continue amlodipine 10 mg/day and coreg  6.25 mg BID.  Vitals:   07/06/23 2016 07/07/23 0524  BP: 114/75 134/89  Pulse: 98 (!) 103  Resp: 19 19  Temp: 98.5 F (36.9 C) 98.8 F (37.1 C)  SpO2: 99% 100%   Keep in 140-160 sys range  10. T2DM w/peripheral neuropathy: Hgb A1c- 11.7. Was on Lantus 30  units PTA?  --Continue Insulin glargline-->was decreased to  5 units/HS today(to start tomorrow as received 10 units this am).   --Hold every 4 hours novolog as not on TF at this moment.  CBG (last 3)  Recent Labs    07/06/23 1624 07/06/23 2110 07/07/23 0617  GLUCAP 207* 192* 174*   Increased  Semglee to 15U 7/7 Change SSI to moderate  7/12 on high side increase to 20U  11. Acute on chronic renal failure?:  Improved with IVF for hydration-->was d/c yesterday but will resume with questionable intake --.SCr 1.02 in 09/22 per chart review. SCr 1.57 @ admission-->1.29-->1.31  PO4 and Mg ok 7/7 7/8 Cr sl elevated close to admission level, IVF as above-recheck later this week. 12. Severe dysphagia: Continue D1, honey liquids. Willing to take liquids-->will offer thickened Glucerna with meals for now.         13. Hyperlipidemia: LDL 265. On Lipitor 80 mg daily. 14. Severe vitamin D deficiency: On Ergocalciferol weekly with dialy low supplement.  15. ABLA: Hgb 13.1 @ admission-->11.1-->9.2. Will order stool guaiacs- none collected 7/5.              --Senna added at bedtime as last BM 06/29.   7/8-stool guaiac negative 7/7     -hgb up to 11.0 16. Low grade fevers: Encourage pulmonary hygiene.  17. Hx tobacco/Marijuana use: Continue  nicotine patch.  Encourage cessation of Marijuana.  18. Intermittent episodes of N/V: Question due to gastroparesis (Multiple ED visits in the past for intractable N/V). 19. Nasal congestion- ordered Flonase 1 sprays daily.  20.  Developing severe spasticity RLE>RUE   7/8-tizanidine just increased 7/7--obsv today 21.  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  Will continue n.p.o. status and have dietary reinsert feeding tube.  We will consult interventional radiology to assess for percutaneous gastrostomy tube placement I discussed this over the phone with the patient's sister Cameron Proud [phone].  She is in favor of placement if  needed, given improvemnet over the last 24h after diet upgrade can hold off on scheduling, not even sure if anatomy would allow PEG,  Will resume  IVF at noc , recheck BMET on Monday   LOS: 10 days A FACE TO FACE EVALUATION WAS PERFORMED  Erick Colace 07/07/2023, 8:32 AM

## 2023-07-07 NOTE — Progress Notes (Signed)
Physical Therapy Session Note  Patient Details  Name: Zachary Winters MRN: [id number] Date of Birth: [birth year]  Today's Date: 07/07/2023 PT Individual Time: 1307-1400 PT Individual Time Calculation (min): 53 min   Short Term Goals: Week 1:  PT Short Term Goal 1 (Week 1): Pt will perform bed mobility at overall ModA. PT Short Term Goal 2 (Week 1): Pt will perform sit<>stand with overall maxA +1 PT Short Term Goal 3 (Week 1): pt will maintain sitting balance to midline with modA +1 for at least . PT Short Term Goal 4 (Week 1): pt will perform squat pivot transfer with MaxA +1 PT Short Term Goal 5 (Week 1): Pt will complete PASS for outcome measure.  Skilled Therapeutic Interventions/Progress Updates: Patient in TIS on entrance to room. Patient alert and agreeable to PT session.   Patient reported feeling better today at beginning of session.   Therapeutic Activity: Transfers: Pt performed squat pivot transfers TIS<>hi/low mat with maxA. Multimodal cues required for pt to use L UE to push self over to desired sport, and to lean in opposite direction for head/hip relationship (mod cues for pt to perform sequence).   Neuromuscular Re-ed: NMR facilitated during session with focus on dynamic sitting balance, hand coordination, and color finding. - Pt on hi/low mat with instructions to anteriorly lean forward and across midline while finding squigs placed on mirror. Pt then cued to take rings and place them on color squig that PTA calls out. Pt required increased time and effort to locate squids on R of mirror (finding blue was easiest but still not 100% accurate; white squigs were the hardest with PTA having to manually point to it). Overall, pt required maxA to place ring on correct color. Pt also required max multimodal cues to avoid using L UE to assist in pulling trunk over to center when excessively leaning to the R. Pt performed task in moderately distractive environment (main gym). Pt  demonstrated ability to correct R lean (to a certain point) without use of L UE, but required use of L UE during functional task such as being asked to take squigs from mirror with L UE to avoid using it as a compensation (pt tactile cued to use L obliques to assist in laterally flexing trunk back to center) but would still require mod/max cues to avoid immediately reaching to hi/low mat edge to pull self over (PTA allowing pt to fall to R for pt to learn self corrective strategies).   NMR performed for improvements in motor control and coordination, balance, sequencing, judgement, and self confidence/ efficacy in performing all aspects of mobility at highest level of independence.   Patient presented with increased discomfort by laterally flexing trunk to the R with L UE over head (L eye slightly swollen shut and watery) while saying "ow" multiple times. PTA asked if pt was in pain in L eye and pt nodded "yes." PTA located charge nurse to assist pt's presentation at that time. PTA also asked pt if he was in discomfort due to soiled brief or fatigue and patient nodded "no." Patient in TIS at end of session with brakes locked, belt alarm set, and all needs within reach awaiting for nursing arrival to check in on pain presentation.       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, honey thick liquids only Restrictions Weight Bearing Restrictions: No  Therapy/Group: Individual Therapy  Loie Jahr PTA 07/07/2023, 3:51 PM

## 2023-07-07 NOTE — Progress Notes (Signed)
Occupational Therapy Session Note  Patient Details  Name: Zachary Winters MRN: [id number] Date of Birth: 03/07/1981  Today's Date: 07/07/2023 OT Individual Time: 2725-3664 OT Individual Time Calculation (min): 59 min    Short Term Goals: Week 2:  OT Short Term Goal 1 (Week 2): pt will complete squat pivot transfers with MOD A +2  as precursor to safe toilet transfers OT Short Term Goal 2 (Week 2): pt will complete sit>stands with MODA +2 to improve pts ability to sit>stand for LB dressing OT Short Term Goal 3 (Week 2): pt will sit EOB with CGA for > 5 mins as precursor to higher level ADLs from EOB  Skilled Therapeutic Interventions/Progress Updates:  Pt greeted seated in w/c , pt agreeable to OT intervention. Session focus on RLE/RUE NMR and transfer training. Pt completed all squat pivot transfers during session to R and L side with MOD A +2.  Once seated on mat table pt lowered himself down on mat seeming to be uncomfortable. Asked pt several yes/no questions such as "are you in pain" "are you dizzy" etc with pt nodding yes to all questions, assessed BP from sitting at 135/101 ( 110) HR 108 Unsure what was bothering pt however no other signs of discomfort indicated during remainder of session.  Worked on sit>stands from [facility] as precursor to LB dressing tasks. Pt completed several reps of sit>stands with overall MOD A +1 with RLE blocked with an emphasis on anterior weight shifting during sit>stand and pushing up from mat table. Pt continues to require multimodal cues to shift weight to L side in standing as pt tends to lean on R side.   Of note pt began to count "1,2,3" when attempting to stand today! Pt much more participatory today and even reached out to give therapist a hug.   Provided NMR to RLE with step placed underneath LLE to facilitate increased weight bearing onto RLE, pt required MOD A +2 for safety to stand with step placed with RLE blocked, mirror provided for visual feedback  to facilitate improved posture and midline positioning.   Pt transitioned to supine with MODA needing assist to elevate BLEs to mat table. Pt able to complete 30 glute bridges with RLE stabilized on mat table to provide NMR to RLE and facilitate improved strength and endurance for BLEs for ADL participation. Pt counting out loud during exercise able to count up to 20 but then needed assist with word finding.   Pt completed supine chest presses and shoulder flexion with RUE secured to unweighted dowel rod to provide self ROM strategies d/t increased tone in RUE. Pt required MIN support at R wrist to maintain optimal joint stability.                   Ended session with pt supine in bed with all needs within reach and bed alarm activated.                    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, honey thick liquids only Restrictions Weight Bearing Restrictions: No    Therapy/Group: Individual Therapy  Pollyann Glen [facility] 07/07/2023, 3:56 PM

## 2023-07-08 ENCOUNTER — Other Ambulatory Visit: Payer: Self-pay

## 2023-07-08 ENCOUNTER — Encounter (HOSPITAL_COMMUNITY): Payer: Self-pay | Admitting: Physical Medicine & Rehabilitation

## 2023-07-08 LAB — GLUCOSE, CAPILLARY
Glucose-Capillary: 159 mg/dL — ABNORMAL HIGH (ref 70–99)
Glucose-Capillary: 185 mg/dL — ABNORMAL HIGH (ref 70–99)
Glucose-Capillary: 221 mg/dL — ABNORMAL HIGH (ref 70–99)
Glucose-Capillary: 226 mg/dL — ABNORMAL HIGH (ref 70–99)

## 2023-07-08 NOTE — Progress Notes (Signed)
Physical Therapy Session Note  Patient Details  Name: Zachary Winters MRN: [id number] Date of Birth: [birth year]  Today's Date: 07/08/2023 PT Individual Time: 9562-1308 PT Individual Time Calculation (min): 30 min   Short Term Goals: Week 1:  PT Short Term Goal 1 (Week 1): Pt will perform bed mobility at overall ModA. PT Short Term Goal 2 (Week 1): Pt will perform sit<>stand with overall maxA +1 PT Short Term Goal 3 (Week 1): pt will maintain sitting balance to midline with modA +1 for at least . PT Short Term Goal 4 (Week 1): pt will perform squat pivot transfer with MaxA +1 PT Short Term Goal 5 (Week 1): Pt will complete PASS for outcome measure.  Skilled Therapeutic Interventions/Progress Updates: Patient supine in bed on entrance to room. Patient alert and agreeable to PT session.   Patient reported needing to be changed. Pt performed supine to L R sidelying with heavy min/modA to perform posterior pericare (VC to use L UE to assist in pulling self over via bed rail). Pt supine to L sidelying with modA to finish donning/doffing soiled brief. Pt then cued to think about moving R LE in order to promote neuromuscular connection (to thread personal shorts). Pt then cued to finish donning shorts with L UE and required modA (cued to bridge on  L LE to don as much as possible -  required PTA to finish donning pants to R hip). Pt then performed supine bridged in bed with PTA pulling hips into extension with bed sheet. Pt cued to hold peak position (pt educated that it would be difficult, but to attempt as much as possible). Pt performed x 7 reps with max cues at first required for pt to understand sequence with multimodal cues and PTA stabilizing R LE.   Patient supine in bed at end of session with brakes locked, bed alarm set, and all needs within reach.      Therapy Documentation Precautions:  Precautions Precautions: Fall Precaution Comments: dense R hemi with flexor tone,  expressive>receptive aphasia, honey thick liquids only Restrictions Weight Bearing Restrictions: No  Therapy/Group: Individual Therapy  Mckinzie Saksa PTA 07/08/2023, 1:33 PM

## 2023-07-08 NOTE — Progress Notes (Signed)
Speech Language Pathology Daily Session Note  Patient Details  Name: Zachary Winters MRN: [id number] Date of Birth: 22-Oct-1981  Today's Date: 07/08/2023 SLP Individual Time: 1015-1055 SLP Individual Time Calculation (min): 40 min  Short Term Goals: Week 2: SLP Short Term Goal 1 (Week 2): Pt will tolerate Dys 2 diet and thin liquids via Provale cup with minimal s/s of aspiration and no indications of development of aspiration pneumonia. SLP Short Term Goal 2 (Week 2): Pt will answer basic Y/N questions with >90% accuracy given min cues. SLP Short Term Goal 3 (Week 2): Pt will follow 1-step commands with >75% accuracy given min cues. SLP Short Term Goal 4 (Week 2): Pt will utilize compensatory swallow strategies to reduced or clear oral residue as needed with min assistance.  Skilled Therapeutic Interventions: Skilled treatment session focused on communication goals. Upon arrival, patient was awake and alert and agreeable to treatment session. SLP facilitated session by asking patient biographical yes/no questions in which he answered with 80% accuracy. Patient also answered specific questions with use of numbers (ex. How many grandchildren do you have) with use of gestures and verbalizing with mod phonemic cues. Patient read names of functional items at the word level with ability to approximate in 100% opportunities as patient consistently omitted the initial phoneme of all items(ock for sock and yler for Daemion, etc.). Patient unable to correct despite Max A multimodal cues. SLP also introduced short, functional phrases (I am hungry, My name is Hymie, etc) which he was able to read with overall Mod A multimodal cues. Patient may benefit from script training. As SLP as leaving, patient spontaneously verbalized, "thank you." Patient left upright in bed with alarm on and all needs within reach. Continue with current plan of care.      Pain Pain Assessment Pain Scale: Faces Faces Pain Scale: No  hurt  Therapy/Group: Individual Therapy  Anzal Bartnick 07/08/2023, 12:37 PM

## 2023-07-08 NOTE — Progress Notes (Signed)
PROGRESS NOTE   Subjective/Complaints:  Pt in bed. Indicates he slept well. No problems reported by nursing. Ate better yesterday. Still incontinent of urine and bowel. One cath overnight  ROS: limited due to language/communication   Objective:   No results found. No results for input(s): "WBC", "HGB", "HCT", "PLT" in the last 72 hours.  Recent Labs    07/07/23 0635  NA 135  K 4.5  CL 103  CO2 23  GLUCOSE 187*  BUN 15  CREATININE 1.54*  CALCIUM 8.8*    Intake/Output Summary (Last 24 hours) at 07/08/2023 0956 Last data filed at 07/07/2023 1807 Gross per 24 hour  Intake 371.62 ml  Output 400 ml  Net -28.38 ml        Physical Exam: Vital Signs Blood pressure (!) 133/98, pulse (!) 102, temperature 98.7 F (37.1 C), resp. rate 18, height 6\' 1"  (1.854 m), weight 78.2 kg, SpO2 100%.  Constitutional: No distress . Vital signs reviewed. HEENT: NCAT, EOMI, oral membranes moist Neck: supple Cardiovascular: RRR without murmur. No JVD    Respiratory/Chest: CTA Bilaterally without wheezes or rales. Normal effort    GI/Abdomen: BS +, non-tender, non-distended Ext: no clubbing, cyanosis, or edema Psych: pleasant and cooperative  Skin: No evidence of breakdown, no evidence of rash Neurologic: Aphasic but communicates with head nods, y/n.--still.  Cranial nerves II through XII intact, motor strength is 5/5 in left and 0/5 right  deltoid, bicep, tricep, grip, hip flexor, knee extensors, ankle dorsiflexor and plantar flexor RUE extension with yawn  Flexor withdrawal spasticity remains severe RLE, MAS 3 in Left elbow flexors--stable Sensory exam reduced pinch sensation on RIght   Musculoskeletal: Full range of motion in all 4 extremities. No joint swelling  Assessment/Plan: 1. Functional deficits which require 3+ hours per day of interdisciplinary therapy in a comprehensive inpatient rehab setting. Physiatrist is providing  close team supervision and 24 hour management of active medical problems listed below. Physiatrist and rehab team continue to assess barriers to discharge/monitor patient progress toward functional and medical goals  Care Tool:  Bathing        Body parts bathed by helper: Right arm, Left arm, Chest, Abdomen, Front perineal area, Buttocks, Right upper leg, Left upper leg, Face, Left lower leg, Right lower leg     Bathing assist Assist Level: Dependent - Patient 0%     Upper Body Dressing/Undressing Upper body dressing   What is the patient wearing?: Pull over shirt    Upper body assist Assist Level: Total Assistance - Patient < 25%    Lower Body Dressing/Undressing Lower body dressing      What is the patient wearing?: Underwear/pull up, Incontinence brief     Lower body assist Assist for lower body dressing: Dependent - Patient 0%     Toileting Toileting    Toileting assist Assist for toileting: Dependent - Patient 0%     Transfers Chair/bed transfer  Transfers assist  Chair/bed transfer activity did not occur: Safety/medical concerns  Chair/bed transfer assist level: Dependent - mechanical lift (STEDY)     Locomotion Ambulation   Ambulation assist      Assist level: Maximal Assistance - Patient 25 -  49% Assistive device: Other (comment) (L hallway handrail) Max distance: 35 ft   Walk 10 feet activity   Assist     Assist level: Maximal Assistance - Patient 25 - 49% Assistive device: Other (comment) (L hallway hand rail)   Walk 50 feet activity   Assist Walk 50 feet with 2 turns activity did not occur: Safety/medical concerns         Walk 150 feet activity   Assist Walk 150 feet activity did not occur: Safety/medical concerns         Walk 10 feet on uneven surface  activity   Assist Walk 10 feet on uneven surfaces activity did not occur: Safety/medical concerns         Wheelchair     Assist Is the patient using a  wheelchair?: Yes Type of Wheelchair: Manual    Wheelchair assist level: Dependent - Patient 0% Max wheelchair distance: 150 ft    Wheelchair 50 feet with 2 turns activity    Assist        Assist Level: Dependent - Patient 0%   Wheelchair 150 feet activity     Assist      Assist Level: Dependent - Patient 0%   Blood pressure (!) 133/98, pulse (!) 102, temperature 98.7 F (37.1 C), resp. rate 18, height 6\' 1"  (1.854 m), weight 78.2 kg, SpO2 100%.  Medical Problem List and Plan: 1. 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 Severe Left M1 and V4 stenosis              -patient may  shower             -ELOS/Goals: 7/30 min A for PT, OT and SLP   -Continue CIR therapies including PT, OT, and SLP   2.  Antithrombotics: -DVT/anticoagulation:  Pharmaceutical: Lovenox, LE doppler neg              -antiplatelet therapy: DAPT for at leasat  3 months. Has declined stenting.   3. Pain Management: Tylenol prn.  4. Mood/Behavior/Sleep: LCSW to follow   for evaluation and support when appropriate.              -antipsychotic agents: N/A 5. Neuropsych/cognition: This patient is not capable of making decisions on his own behalf. 6. Skin/Wound Care: Routine pressure relief measures.  7. Fluids/Electrolytes/Nutrition:   -intake a little more consistent yesterday  -continue IVF at HS  -f/u labs monday 8. 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 HTN: Monitor BP TID--long term goal normotensive             --continue amlodipine 10 mg/day and coreg  6.25 mg BID.  Vitals:   07/07/23 1944 07/08/23 0332  BP: (!) 142/83 (!) 133/98  Pulse: (!) 102 (!) 102  Resp: 18 18  Temp: 99 F (37.2 C) 98.7 F (37.1 C)  SpO2: 99% 100%   Keep in 140-160 sys range--on target generally  10. T2DM w/peripheral neuropathy: Hgb A1c- 11.7. Was on  Lantus 30 units PTA?  --Continue Insulin glargline-->was decreased to  5 units/HS today(to start tomorrow as received 10 units this am).   --Hold every 4 hours novolog as not on TF at this moment.  CBG (last 3)  Recent Labs    07/07/23 1644 07/07/23 2102 07/08/23 0611  GLUCAP 205* 262* 159*   Increased Semglee to 15U 7/7 Change SSI to moderate  7/13 obsv today with yesterday's increase to 20U  11. Acute on chronic renal failure?:  Improved with IVF for hydration-->was d/c yesterday but will resume with questionable intake --.SCr 1.02 in 09/22 per chart review. SCr 1.57 @ admission-->1.29-->1.31  PO4 and Mg ok 7/7 IVF HS, recheck Monday 12. Severe dysphagia: Continue D1, honey liquids. Willing to take liquids-->will offer thickened Glucerna with meals for now.         13. Hyperlipidemia: LDL 265. On Lipitor 80 mg daily. 14. Severe vitamin D deficiency: On Ergocalciferol weekly with dialy low supplement.  15. ABLA: Hgb 13.1 @ admission-->11.1-->9.2. Will order stool guaiacs- none collected 7/5.              --Senna added at bedtime as last BM 06/29.   7/8-stool guaiac negative 7/7     -hgb up to 11.0 16. Low grade fevers: Encourage pulmonary hygiene.  17. Hx tobacco/Marijuana use: Continue  nicotine patch.  Encourage cessation of Marijuana.  18. Intermittent episodes of N/V: Question due to gastroparesis (Multiple ED visits in the past for intractable N/V). 19. Nasal congestion- ordered Flonase 1 sprays daily.  20.  Developing severe spasticity RLE>RUE   7/8-tizanidine just increased 7/7--obsv today 21.  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  Will continue n.p.o. status and have dietary reinsert feeding tube.  We will consult interventional radiology to assess for percutaneous gastrostomy tube placement I discussed this over the phone with the patient's sister Cameron Proud [phone].  She is in favor of placement if needed, given improvemnet over the  last 24h after diet upgrade can hold off on scheduling, not even sure if anatomy would allow PEG,  on IVF at noc , recheck BMET on Monday     LOS: 11 days A FACE TO FACE EVALUATION WAS PERFORMED  Ranelle Oyster 07/08/2023, 9:56 AM

## 2023-07-09 LAB — GLUCOSE, CAPILLARY
Glucose-Capillary: 150 mg/dL — ABNORMAL HIGH (ref 70–99)
Glucose-Capillary: 171 mg/dL — ABNORMAL HIGH (ref 70–99)
Glucose-Capillary: 185 mg/dL — ABNORMAL HIGH (ref 70–99)
Glucose-Capillary: 151 mg/dL — ABNORMAL HIGH (ref 70–99)

## 2023-07-09 MED ORDER — INSULIN GLARGINE-YFGN 100 UNIT/ML ~~LOC~~ SOLN
22.0000 [IU] | Freq: Every day | SUBCUTANEOUS | Status: DC
  Administered 2023-07-09 – 2023-07-13 (×5): 22 [IU] via SUBCUTANEOUS
  Filled 2023-07-09 (×6): qty 0.22

## 2023-07-09 NOTE — Progress Notes (Signed)
PROGRESS NOTE   Subjective/Complaints:  Pt slept last night. Denies headache or pain.    ROS: limited due to language/communication   Objective:   No results found. No results for input(s): "WBC", "HGB", "HCT", "PLT" in the last 72 hours.  Recent Labs    07/07/23 0635  NA 135  K 4.5  CL 103  CO2 23  GLUCOSE 187*  BUN 15  CREATININE 1.54*  CALCIUM 8.8*    Intake/Output Summary (Last 24 hours) at 07/09/2023 0926 Last data filed at 07/09/2023 0440 Gross per 24 hour  Intake 490 ml  Output --  Net 490 ml        Physical Exam: Vital Signs Blood pressure (!) 131/94, pulse 100, temperature 98.9 F (37.2 C), resp. rate 20, height 6\' 1"  (1.854 m), weight 80.6 kg, SpO2 100%.  Constitutional: No distress . Vital signs reviewed. HEENT: NCAT, EOMI, oral membranes moist Neck: supple Cardiovascular: RRR without murmur. No JVD    Respiratory/Chest: CTA Bilaterally without wheezes or rales. Normal effort    GI/Abdomen: BS +, non-tender, non-distended Ext: no clubbing, cyanosis, or edema Psych: pleasant and cooperative   Skin: No evidence of breakdown, no evidence of rash Neurologic: Aphasic but communicates with head nods, y/n.--still.  Cranial nerves II through XII intact, motor strength is 5/5 in left and 0/5 right  deltoid, bicep, tricep, grip, hip flexor, knee extensors, ankle dorsiflexor and plantar flexor RUE extension with yawn  Flexor withdrawal spasticity present RLE, MAS 3 in Left elbow flexors Sensory exam reduced pinch sensation on RIght   Musculoskeletal: Full range of motion in all 4 extremities. No joint swelling  Assessment/Plan: 1. Functional deficits which require 3+ hours per day of interdisciplinary therapy in a comprehensive inpatient rehab setting. Physiatrist is providing close team supervision and 24 hour management of active medical problems listed below. Physiatrist and rehab team continue to  assess barriers to discharge/monitor patient progress toward functional and medical goals  Care Tool:  Bathing        Body parts bathed by helper: Right arm, Left arm, Chest, Abdomen, Front perineal area, Buttocks, Right upper leg, Left upper leg, Face, Left lower leg, Right lower leg     Bathing assist Assist Level: Dependent - Patient 0%     Upper Body Dressing/Undressing Upper body dressing   What is the patient wearing?: Pull over shirt    Upper body assist Assist Level: Total Assistance - Patient < 25%    Lower Body Dressing/Undressing Lower body dressing      What is the patient wearing?: Underwear/pull up, Incontinence brief     Lower body assist Assist for lower body dressing: Dependent - Patient 0%     Toileting Toileting    Toileting assist Assist for toileting: Dependent - Patient 0%     Transfers Chair/bed transfer  Transfers assist  Chair/bed transfer activity did not occur: Safety/medical concerns  Chair/bed transfer assist level: Dependent - mechanical lift (STEDY)     Locomotion Ambulation   Ambulation assist      Assist level: Maximal Assistance - Patient 25 - 49% Assistive device: Other (comment) (L hallway handrail) Max distance: 35 ft   Walk 10  feet activity   Assist     Assist level: Maximal Assistance - Patient 25 - 49% Assistive device: Other (comment) (L hallway hand rail)   Walk 50 feet activity   Assist Walk 50 feet with 2 turns activity did not occur: Safety/medical concerns         Walk 150 feet activity   Assist Walk 150 feet activity did not occur: Safety/medical concerns         Walk 10 feet on uneven surface  activity   Assist Walk 10 feet on uneven surfaces activity did not occur: Safety/medical concerns         Wheelchair     Assist Is the patient using a wheelchair?: Yes Type of Wheelchair: Manual    Wheelchair assist level: Dependent - Patient 0% Max wheelchair distance: 150 ft     Wheelchair 50 feet with 2 turns activity    Assist        Assist Level: Dependent - Patient 0%   Wheelchair 150 feet activity     Assist      Assist Level: Dependent - Patient 0%   Blood pressure (!) 131/94, pulse 100, temperature 98.9 F (37.2 C), resp. rate 20, height 6\' 1"  (1.854 m), weight 80.6 kg, SpO2 100%.  Medical Problem List and Plan: 1. 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 Severe Left M1 and V4 stenosis              -patient may  shower             -ELOS/Goals: 7/30 min A for PT, OT and SLP   -Continue CIR therapies including PT, OT, and SLP    2.  Antithrombotics: -DVT/anticoagulation:  Pharmaceutical: Lovenox, LE doppler neg              -antiplatelet therapy: DAPT for at leasat  3 months. Has declined stenting.   3. Pain Management: Tylenol prn.  4. Mood/Behavior/Sleep: LCSW to follow   for evaluation and support when appropriate.              -antipsychotic agents: N/A 5. Neuropsych/cognition: This patient is not capable of making decisions on his own behalf. 6. Skin/Wound Care: Routine pressure relief measures.  7. Fluids/Electrolytes/Nutrition:   -intake a little more consistent yesterday  -continue IVF at [facility]  -labs pending for 7/15 8. 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 HTN: Monitor BP TID--long term goal normotensive             --continue amlodipine 10 mg/day and coreg  6.25 mg BID.  Vitals:   07/08/23 2013 07/09/23 0411  BP: (!) 137/90 (!) 131/94  Pulse: 92 100  Resp: 20 20  Temp: 98.1 F (36.7 C) 98.9 F (37.2 C)  SpO2: 100% 100%   Keep in 140-160 sys range--near target  10. T2DM w/peripheral neuropathy: Hgb A1c- 11.7. Was on Lantus 30 units PTA?  --Continue Insulin glargline-->was decreased to  5 units/HS today(to start tomorrow as received 10 units this am).   --Hold  every 4 hours novolog as not on TF at this moment.  CBG (last 3)  Recent Labs    07/08/23 1608 07/08/23 2109 07/09/23 0624  GLUCAP 221* 226* 150*   Increased Semglee to 15U 7/7 Change SSI to moderate  7/14 increase semglee to 22 u d/t persistent elevation 11. Acute on chronic renal failure?:  Improved  with IVF for hydration-->was d/c yesterday but will resume with questionable intake --.SCr 1.02 in 09/22 per chart review. SCr 1.57 @ admission-->1.29-->1.31  PO4 and Mg ok 7/7 IVF HS, recheck Monday 12. Severe dysphagia: Continue D1, honey liquids. Willing to take liquids-->will offer thickened Glucerna with meals for now.         13. Hyperlipidemia: LDL 265. On Lipitor 80 mg daily. 14. Severe vitamin D deficiency: On Ergocalciferol weekly with dialy low supplement.  15. ABLA: Hgb 13.1 @ admission-->11.1-->9.2. Will order stool guaiacs- none collected 7/5.              --Senna added at bedtime as last BM 06/29.   7/8-stool guaiac negative 7/7     -hgb up to 11.0 16. Low grade fevers: Encourage pulmonary hygiene.  17. Hx tobacco/Marijuana use: Continue  nicotine patch.  Encourage cessation of Marijuana.  18. Intermittent episodes of N/V: Question due to gastroparesis (Multiple ED visits in the past for intractable N/V). 19. Nasal congestion- ordered Flonase 1 sprays daily.  20.  Developing severe spasticity RLE>RUE   7/8-tizanidine just increased 7/7--obsv today 21.  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  Will continue n.p.o. status and have dietary reinsert feeding tube.  We will consult interventional radiology to assess for percutaneous gastrostomy tube placement I discussed this over the phone with the patient's sister Cameron Proud [phone].  She is in favor of placement if needed, given improvemnet over the last 24h after diet upgrade can hold off on scheduling, not even sure if anatomy would allow PEG,    -7/14 remains on IVF. Labs tomorrow      LOS: 12 days A FACE TO FACE EVALUATION WAS PERFORMED  Ranelle Oyster 07/09/2023, 9:26 AM

## 2023-07-09 NOTE — Plan of Care (Signed)
  Problem: Consults Goal: RH STROKE PATIENT EDUCATION Description: See Patient Education module for education specifics  Outcome: Progressing   Problem: RH BOWEL ELIMINATION Goal: RH STG MANAGE BOWEL WITH ASSISTANCE Description: STG Manage Bowel with mod I Assistance. Outcome: Progressing Goal: RH STG MANAGE BOWEL W/MEDICATION W/ASSISTANCE Description: STG Manage Bowel with Medication with mod I  Assistance. Outcome: Progressing   Problem: RH BLADDER ELIMINATION Goal: RH STG MANAGE BLADDER WITH ASSISTANCE Description: STG Manage Bladder With toileting Assistance Outcome: Progressing   Problem: RH SAFETY Goal: RH STG ADHERE TO SAFETY PRECAUTIONS W/ASSISTANCE/DEVICE Description: STG Adhere to Safety Precautions With cues Assistance/Device. Outcome: Progressing   Problem: RH KNOWLEDGE DEFICIT Goal: RH STG INCREASE KNOWLEDGE OF DIABETES Description: Patient and family will be able to manage DM with medications and dietary modifications using educational resources independently Outcome: Progressing Goal: RH STG INCREASE KNOWLEDGE OF HYPERTENSION Description: Patient and family will be able to manage HTN with medications and dietary modifications using educational resources independently Outcome: Progressing Goal: RH STG INCREASE KNOWLEDGE OF DYSPHAGIA/FLUID INTAKE Description: Patient and family will be able to manage Dysphagia, medications and dietary modifications using educational resources independently Outcome: Progressing Goal: RH STG INCREASE KNOWLEGDE OF HYPERLIPIDEMIA Description: Patient and family will be able to manage HLD with medications and dietary modifications using educational resources independently Outcome: Progressing Goal: RH STG INCREASE KNOWLEDGE OF STROKE PROPHYLAXIS Description: Patient and family will be able to manage secondary risks with medications and dietary modifications using educational resources independently Outcome: Progressing   Problem:  Education: Goal: Knowledge of disease or condition will improve Outcome: Progressing Goal: Knowledge of secondary prevention will improve (MUST DOCUMENT ALL) Outcome: Progressing Goal: Knowledge of patient specific risk factors will improve (Mark N/A or DELETE if not current risk factor) Outcome: Progressing   Problem: Ischemic Stroke/TIA Tissue Perfusion: Goal: Complications of ischemic stroke/TIA will be minimized Outcome: Progressing   Problem: Coping: Goal: Will verbalize positive feelings about self Outcome: Progressing Goal: Will identify appropriate support needs Outcome: Progressing   Problem: Health Behavior/Discharge Planning: Goal: Ability to manage health-related needs will improve Outcome: Progressing Goal: Goals will be collaboratively established with patient/family Outcome: Progressing   Problem: Self-Care: Goal: Ability to participate in self-care as condition permits will improve Outcome: Progressing Goal: Verbalization of feelings and concerns over difficulty with self-care will improve Outcome: Progressing Goal: Ability to communicate needs accurately will improve Outcome: Progressing   Problem: Nutrition: Goal: Risk of aspiration will decrease Outcome: Progressing Goal: Dietary intake will improve Outcome: Progressing   Problem: Education: Goal: Ability to describe self-care measures that may prevent or decrease complications (Diabetes Survival Skills Education) will improve Outcome: Progressing Goal: Individualized Educational Video(s) Outcome: Progressing   Problem: Coping: Goal: Ability to adjust to condition or change in health will improve Outcome: Progressing   Problem: Fluid Volume: Goal: Ability to maintain a balanced intake and output will improve Outcome: Progressing   Problem: Health Behavior/Discharge Planning: Goal: Ability to identify and utilize available resources and services will improve Outcome: Progressing Goal: Ability  to manage health-related needs will improve Outcome: Progressing   Problem: Metabolic: Goal: Ability to maintain appropriate glucose levels will improve Outcome: Progressing   Problem: Nutritional: Goal: Maintenance of adequate nutrition will improve Outcome: Progressing Goal: Progress toward achieving an optimal weight will improve Outcome: Progressing   Problem: Skin Integrity: Goal: Risk for impaired skin integrity will decrease Outcome: Progressing   Problem: Tissue Perfusion: Goal: Adequacy of tissue perfusion will improve Outcome: Progressing   

## 2023-07-10 LAB — CBC
HCT: 29.9 % — ABNORMAL LOW (ref 39.0–52.0)
Hemoglobin: 9.9 g/dL — ABNORMAL LOW (ref 13.0–17.0)
MCH: 29.4 pg (ref 26.0–34.0)
MCHC: 33.1 g/dL (ref 30.0–36.0)
MCV: 88.7 fL (ref 80.0–100.0)
Platelets: 338 10*3/uL (ref 150–400)
RBC: 3.37 MIL/uL — ABNORMAL LOW (ref 4.22–5.81)
RDW: 13.2 % (ref 11.5–15.5)
WBC: 7.9 10*3/uL (ref 4.0–10.5)
nRBC: 0 % (ref 0.0–0.2)

## 2023-07-10 LAB — BASIC METABOLIC PANEL
Anion gap: 7 (ref 5–15)
BUN: 18 mg/dL (ref 6–20)
CO2: 25 mmol/L (ref 22–32)
Calcium: 8.9 mg/dL (ref 8.9–10.3)
Chloride: 102 mmol/L (ref 98–111)
Creatinine, Ser: 1.49 mg/dL — ABNORMAL HIGH (ref 0.61–1.24)
GFR, Estimated: >60 mL/min (ref >60)
Glucose, Bld: 165 mg/dL — ABNORMAL HIGH (ref 70–99)
Potassium: 4.2 mmol/L (ref 3.5–5.1)
Sodium: 134 mmol/L — ABNORMAL LOW (ref 135–145)

## 2023-07-10 LAB — GLUCOSE, CAPILLARY
Glucose-Capillary: 156 mg/dL — ABNORMAL HIGH (ref 70–99)
Glucose-Capillary: 190 mg/dL — ABNORMAL HIGH (ref 70–99)
Glucose-Capillary: 152 mg/dL — ABNORMAL HIGH (ref 70–99)
Glucose-Capillary: 195 mg/dL — ABNORMAL HIGH (ref 70–99)

## 2023-07-10 NOTE — Progress Notes (Signed)
PROGRESS NOTE   Subjective/Complaints:   Remains severely aphasic   ROS: limited due to language/communication   Objective:   No results found. Recent Labs    07/10/23 0658  WBC 7.9  HGB 9.9*  HCT 29.9*  PLT 338    Recent Labs    07/10/23 0658  NA 134*  K 4.2  CL 102  CO2 25  GLUCOSE 165*  BUN 18  CREATININE 1.49*  CALCIUM 8.9    Intake/Output Summary (Last 24 hours) at 07/10/2023 1007 Last data filed at 07/10/2023 0700 Gross per 24 hour  Intake 338 ml  Output --  Net 338 ml        Physical Exam: Vital Signs Blood pressure (!) 145/92, pulse 93, temperature 98.7 F (37.1 C), temperature source Oral, resp. rate 18, height 6\' 1"  (1.854 m), weight 80.6 kg, SpO2 99%.   General: No acute distress Mood and affect are appropriate Heart: Regular rate and rhythm no rubs murmurs or extra sounds Lungs: Clear to auscultation, breathing unlabored, no rales or wheezes Abdomen: Positive bowel sounds, soft nontender to palpation, nondistended Extremities: No clubbing, cyanosis, or edema Skin: No evidence of breakdown, no evidence of rash  Neurologic: Aphasic but communicates with head nods, y/n.--still.  Cranial nerves II through XII intact, motor strength is 5/5 in left and 0/5 right  deltoid, bicep, tricep, grip, hip flexor, knee extensors, ankle dorsiflexor and plantar flexor RUE extension with yawn  Flexor withdrawal spasticity present RLE, MAS 3 in Left elbow flexors Sensory exam reduced pinch sensation on RIght   Musculoskeletal: Full range of motion in all 4 extremities. No joint swelling  Assessment/Plan: 1. Functional deficits which require 3+ hours per day of interdisciplinary therapy in a comprehensive inpatient rehab setting. Physiatrist is providing close team supervision and 24 hour management of active medical problems listed below. Physiatrist and rehab team continue to assess barriers to  discharge/monitor patient progress toward functional and medical goals  Care Tool:  Bathing        Body parts bathed by helper: Right arm, Left arm, Chest, Abdomen, Front perineal area, Buttocks, Right upper leg, Left upper leg, Face, Left lower leg, Right lower leg     Bathing assist Assist Level: Dependent - Patient 0%     Upper Body Dressing/Undressing Upper body dressing   What is the patient wearing?: Pull over shirt    Upper body assist Assist Level: Total Assistance - Patient < 25%    Lower Body Dressing/Undressing Lower body dressing      What is the patient wearing?: Underwear/pull up, Incontinence brief     Lower body assist Assist for lower body dressing: Dependent - Patient 0%     Toileting Toileting    Toileting assist Assist for toileting: Dependent - Patient 0%     Transfers Chair/bed transfer  Transfers assist  Chair/bed transfer activity did not occur: Safety/medical concerns  Chair/bed transfer assist level: Dependent - mechanical lift (STEDY)     Locomotion Ambulation   Ambulation assist      Assist level: Maximal Assistance - Patient 25 - 49% Assistive device: Other (comment) (L hallway handrail) Max distance: 35 ft   Walk 10  feet activity   Assist     Assist level: Maximal Assistance - Patient 25 - 49% Assistive device: Other (comment) (L hallway hand rail)   Walk 50 feet activity   Assist Walk 50 feet with 2 turns activity did not occur: Safety/medical concerns         Walk 150 feet activity   Assist Walk 150 feet activity did not occur: Safety/medical concerns         Walk 10 feet on uneven surface  activity   Assist Walk 10 feet on uneven surfaces activity did not occur: Safety/medical concerns         Wheelchair     Assist Is the patient using a wheelchair?: Yes Type of Wheelchair: Manual    Wheelchair assist level: Dependent - Patient 0% Max wheelchair distance: 150 ft    Wheelchair 50  feet with 2 turns activity    Assist        Assist Level: Dependent - Patient 0%   Wheelchair 150 feet activity     Assist      Assist Level: Dependent - Patient 0%   Blood pressure (!) 145/92, pulse 93, temperature 98.7 F (37.1 C), temperature source Oral, resp. rate 18, height 6\' 1"  (1.854 m), weight 80.6 kg, SpO2 99%.  Medical Problem List and Plan: 1. 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 Severe Left M1 and V4 stenosis              -patient may  shower             -ELOS/Goals: 7/30 min A for PT, OT and SLP   -Continue CIR therapies including PT, OT, and SLP    2.  Antithrombotics: -DVT/anticoagulation:  Pharmaceutical: Lovenox, LE doppler neg              -antiplatelet therapy: DAPT for at leasat  3 months. Has declined stenting.   3. Pain Management: Tylenol prn.  4. Mood/Behavior/Sleep: LCSW to follow   for evaluation and support when appropriate.              -antipsychotic agents: N/A 5. Neuropsych/cognition: This patient is not capable of making decisions on his own behalf. 6. Skin/Wound Care: Routine pressure relief measures.  7. Fluids/Electrolytes/Nutrition:   -intake a little more consistent yesterday  -continue IVF at [facility]  -labs pending for 7/15 8. 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 HTN: Monitor BP TID--long term goal normotensive             --continue amlodipine 10 mg/day and coreg  6.25 mg BID.  Vitals:   07/09/23 1954 07/10/23 0545  BP: (!) 148/99 (!) 145/92  Pulse: 96 93  Resp: 18 18  Temp: 98.6 F (37 C) 98.7 F (37.1 C)  SpO2: 100% 99%   Keep in 140-160 sys range-- at target with systolic , may improve off IVF   10. T2DM w/peripheral neuropathy: Hgb A1c- 11.7. Was on Lantus 30 units PTA?  --Continue Insulin glargline-->was decreased to  5 units/HS today(to start tomorrow as  received 10 units this am).   --Hold every 4 hours novolog as not on TF at this moment.  CBG (last 3)  Recent Labs    07/09/23 1630 07/09/23 2140 07/10/23 0542  GLUCAP 185* 151* 156*   Increased Semglee to 15U 7/7 Change SSI to moderate  7/14 increase semglee to 22  u d/t persistent elevation 7/15 good control  11. Acute on chronic renal failure?:  Improved with IVF for hydration-->was d/c yesterday but will resume with questionable intake --.SCr 1.02 in 09/22 per chart review. SCr 1.57 @ admission-->1.29-->1.31  PO4 and Mg ok 7/7 IVF HS, recheck Monday 12. Severe dysphagia: Continue D1, honey liquids. Willing to take liquids-->will offer thickened Glucerna with meals for now.         13. Hyperlipidemia: LDL 265. On Lipitor 80 mg daily. 14. Severe vitamin D deficiency: On Ergocalciferol weekly with dialy low supplement.  15. ABLA: Hgb 13.1 @ admission-->11.1-->9.2. Will order stool guaiacs- none collected 7/5.              --Senna added at bedtime as last BM 06/29.   7/8-stool guaiac negative 7/7     -hgb up to 11.0 16. Low grade fevers: Encourage pulmonary hygiene.  17. Hx tobacco/Marijuana use: Continue  nicotine patch.  Encourage cessation of Marijuana.  18. Intermittent episodes of N/V: Question due to gastroparesis (Multiple ED visits in the past for intractable N/V). 19. Nasal congestion- ordered Flonase 1 sprays daily.  20.  Developing severe spasticity RLE>RUE   7/8-tizanidine just increased 7/7--obsv today 21.  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  Will continue n.p.o. status and have dietary reinsert feeding tube.  We will consult interventional radiology to assess for percutaneous gastrostomy tube placement I discussed this over the phone with the patient's sister Cameron Proud [phone].  She is in favor of placement if needed, given improvemnet over the last 24h after diet upgrade can hold off on scheduling, not even sure if anatomy would  allow PEG,        Latest Ref Rng & Units 07/10/2023    6:58 AM 07/07/2023    6:35 AM 07/04/2023    5:37 AM  BMP  Glucose 70 - 99 mg/dL 244  010  272   BUN 6 - 20 mg/dL 18  15  17    Creatinine 0.61 - 1.24 mg/dL 5.36  6.44  0.34   Sodium 135 - 145 mmol/L 134  135  137   Potassium 3.5 - 5.1 mmol/L 4.2  4.5  4.2   Chloride 98 - 111 mmol/L 102  103  103   CO2 22 - 32 mmol/L 25  23  26    Calcium 8.9 - 10.3 mg/dL 8.9  8.8  8.8     Stable 7/15, d/c IVF   LOS: 13 days A FACE TO FACE EVALUATION WAS PERFORMED  Victorino Sparrow Aissa Lisowski 07/10/2023, 10:07 AM

## 2023-07-10 NOTE — Progress Notes (Signed)
Occupational Therapy Session Note  Patient Details  Name: Zachary Winters MRN: [id number] Date of Birth: 1981-05-17  Today's Date: 07/10/2023 OT Individual Time: 2130-8657 OT Individual Time Calculation (min): 30 min    Short Term Goals: Week 2:  OT Short Term Goal 1 (Week 2): pt will complete squat pivot transfers with MOD A +2  as precursor to safe toilet transfers OT Short Term Goal 2 (Week 2): pt will complete sit>stands with MODA +2 to improve pts ability to sit>stand for LB dressing OT Short Term Goal 3 (Week 2): pt will sit EOB with CGA for > 5 mins as precursor to higher level ADLs from EOB  Skilled Therapeutic Interventions/Progress Updates:  Pt greeted seated in TIS, pt agreeable to OT intervention. Pt completed squat pivot back to bed to L side with MODA +2 for safety. Pt completed supine>sit with MODA needing assist to elevate BLE to EOB, attempted to work on hooking LLE under RLE however pt too impulsive to attempt.   Worked on RUE self ROM, education and demo provided on ranging technique with pt able to return demo. Pt with difficulty motor planning task but able to complete with max multimodal cues. Of note pt verbalizing more today stating " no" "yes" and"okay." Providing PROM to R elbow, wrist and digits for tone mgmt. Placed R wrist and digits in neutral position on wash cloth, secured with coband to optimize joint stability/integrity.   Ended session with pt supine in bed with bed alarm activated and all needs within reach.   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 Restrictions Weight Bearing Restrictions: No  Pain: No indications of pain during session.    Therapy/Group: Individual Therapy  [name] 07/10/2023, 2:40 PM

## 2023-07-10 NOTE — Progress Notes (Signed)
Occupational Therapy Session Note  Patient Details  Name: Zachary Winters MRN: [id number] Date of Birth: [birth year]  Today's Date: 07/10/2023 OT Individual Time: 7846-9629 OT Individual Time Calculation (min): 75 min    Short Term Goals: Week 1:  OT Short Term Goal 1 (Week 1): pt will be able to hold static sit balance with CGA to enable him to sit safely on a BSC. OT Short Term Goal 1 - Progress (Week 1): Met OT Short Term Goal 2 (Week 1): Pt will be able to sit to stand in a stedy lift with mod A to enable a safe transfer to a BSC. OT Short Term Goal 2 - Progress (Week 1): Met OT Short Term Goal 3 (Week 1): Pt will demonstrate improved R side attention by completing RUE self ROM to prevent contractures due to hypertone, OT Short Term Goal 3 - Progress (Week 1): Progressing toward goal OT Short Term Goal 4 (Week 1): Pt will sit to stand with max A to enable care givers to pull clothing up over hips. OT Short Term Goal 4 - Progress (Week 1): Met Week 2:  OT Short Term Goal 1 (Week 2): pt will complete squat pivot transfers with MOD A +2  as precursor to safe toilet transfers OT Short Term Goal 2 (Week 2): pt will complete sit>stands with MODA +2 to improve pts ability to sit>stand for LB dressing OT Short Term Goal 3 (Week 2): pt will sit EOB with CGA for > 5 mins as precursor to higher level ADLs from EOB      Skilled Therapeutic Interventions/Progress Updates:    Pt received in bed and agreeable to therapy.  +2 A from rehab tech for first 45 min of session.. pt worked on rolling to R side with min A and then sidelying to sit with mod A. Pt used L hand on bedrail for support with cues.  Much of the thearapy session focused on bed to wc and wc to tub bench transfers with a focus on L foot placement (pt kept pushing foot out front) and utilization of his L hand. He needs significant cuing due to motor apraxia.  R foot positioned by therapist.  Cues to lean body weight forward to lift hips with  max A to transfer.  Pt nods yes often but only truly comprehends all of the cues about 25% of the time.   Fortunately his static sit balance is improving so we were able to initiate a shower.  In shower pt able to use L hand for automatic movements to wash what he could reach but did need max cues due to perseveration on body parts (ie kept just washing his face). Bathed UB mod A and LB max.  Tried lateral leans for bottom with full support to lean R so he could use L hand.   Max transfer with cues and A to squat pivot back to w/c.  Completed dressing from chair using bed rail for support on his L hand with standing.  Standing with heavy max  A and cues to not push to his R.  Tech pulled brief and pants up while therapist supported the patient.  Oral care at the sink with improved praxis for brushing teeth and then leaning over the sink to have fluid drain out of mouth.  He continues to have limited ability to spit out so used suction to remove liquid from mouth.  Arm positioned with pillow in wc. Improved participation and initiation.  Pt  resting in w/c with all needs met. Alarm set and call light in reach.    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 Restrictions Weight Bearing Restrictions: No      Pain: Pain Assessment Pain Score: 0-No pain ADL: ADL Eating: Dependent Grooming: Moderate assistance Where Assessed-Grooming: Sitting at sink Upper Body Bathing: Moderate assistance Where Assessed-Upper Body Bathing: Shower Lower Body Bathing: Maximal assistance Where Assessed-Lower Body Bathing: Shower Upper Body Dressing: Moderate assistance Where Assessed-Upper Body Dressing: Wheelchair Lower Body Dressing: Dependent Where Assessed-Lower Body Dressing: Wheelchair Toileting: Not assessed Toilet Transfer: Not assessed Film/video editor: Maximal assistance, Maximal  cueing Film/video editor Method: Administrator: Emergency planning/management officer, Grab bars    Therapy/Group: Individual Therapy  Adrena Nakamura 07/10/2023, 12:44 PM

## 2023-07-10 NOTE — Plan of Care (Signed)
  Problem: Consults Goal: RH STROKE PATIENT EDUCATION Description: See Patient Education module for education specifics  Outcome: Progressing   Problem: RH BOWEL ELIMINATION Goal: RH STG MANAGE BOWEL WITH ASSISTANCE Description: STG Manage Bowel with mod I Assistance. Outcome: Progressing Goal: RH STG MANAGE BOWEL W/MEDICATION W/ASSISTANCE Description: STG Manage Bowel with Medication with mod I  Assistance. Outcome: Progressing   Problem: RH BLADDER ELIMINATION Goal: RH STG MANAGE BLADDER WITH ASSISTANCE Description: STG Manage Bladder With toileting Assistance Outcome: Progressing   Problem: RH SAFETY Goal: RH STG ADHERE TO SAFETY PRECAUTIONS W/ASSISTANCE/DEVICE Description: STG Adhere to Safety Precautions With cues Assistance/Device. Outcome: Progressing   Problem: RH KNOWLEDGE DEFICIT Goal: RH STG INCREASE KNOWLEDGE OF DIABETES Description: Patient and family will be able to manage DM with medications and dietary modifications using educational resources independently Outcome: Progressing Goal: RH STG INCREASE KNOWLEDGE OF HYPERTENSION Description: Patient and family will be able to manage HTN with medications and dietary modifications using educational resources independently Outcome: Progressing Goal: RH STG INCREASE KNOWLEDGE OF DYSPHAGIA/FLUID INTAKE Description: Patient and family will be able to manage Dysphagia, medications and dietary modifications using educational resources independently Outcome: Progressing Goal: RH STG INCREASE KNOWLEGDE OF HYPERLIPIDEMIA Description: Patient and family will be able to manage HLD with medications and dietary modifications using educational resources independently Outcome: Progressing Goal: RH STG INCREASE KNOWLEDGE OF STROKE PROPHYLAXIS Description: Patient and family will be able to manage secondary risks with medications and dietary modifications using educational resources independently Outcome: Progressing   Problem:  Education: Goal: Knowledge of disease or condition will improve Outcome: Progressing Goal: Knowledge of secondary prevention will improve (MUST DOCUMENT ALL) Outcome: Progressing Goal: Knowledge of patient specific risk factors will improve (Mark N/A or DELETE if not current risk factor) Outcome: Progressing   Problem: Ischemic Stroke/TIA Tissue Perfusion: Goal: Complications of ischemic stroke/TIA will be minimized Outcome: Progressing   Problem: Coping: Goal: Will verbalize positive feelings about self Outcome: Progressing Goal: Will identify appropriate support needs Outcome: Progressing   Problem: Health Behavior/Discharge Planning: Goal: Ability to manage health-related needs will improve Outcome: Progressing Goal: Goals will be collaboratively established with patient/family Outcome: Progressing   Problem: Self-Care: Goal: Ability to participate in self-care as condition permits will improve Outcome: Progressing Goal: Verbalization of feelings and concerns over difficulty with self-care will improve Outcome: Progressing Goal: Ability to communicate needs accurately will improve Outcome: Progressing   Problem: Nutrition: Goal: Risk of aspiration will decrease Outcome: Progressing Goal: Dietary intake will improve Outcome: Progressing   Problem: Education: Goal: Ability to describe self-care measures that may prevent or decrease complications (Diabetes Survival Skills Education) will improve Outcome: Progressing Goal: Individualized Educational Video(s) Outcome: Progressing   Problem: Coping: Goal: Ability to adjust to condition or change in health will improve Outcome: Progressing   Problem: Fluid Volume: Goal: Ability to maintain a balanced intake and output will improve Outcome: Progressing   Problem: Health Behavior/Discharge Planning: Goal: Ability to identify and utilize available resources and services will improve Outcome: Progressing Goal: Ability  to manage health-related needs will improve Outcome: Progressing   Problem: Metabolic: Goal: Ability to maintain appropriate glucose levels will improve Outcome: Progressing   Problem: Nutritional: Goal: Maintenance of adequate nutrition will improve Outcome: Progressing Goal: Progress toward achieving an optimal weight will improve Outcome: Progressing   Problem: Skin Integrity: Goal: Risk for impaired skin integrity will decrease Outcome: Progressing   Problem: Tissue Perfusion: Goal: Adequacy of tissue perfusion will improve Outcome: Progressing   

## 2023-07-10 NOTE — Progress Notes (Signed)
Physical Therapy Session Note  Patient Details  Name: Zachary Winters MRN: [id number] Date of Birth: March 04, 1981  Today's Date: 07/10/2023 PT Individual Time: 1116-1202 PT Individual Time Calculation (min): 46 min   Short Term Goals: Week 1:  PT Short Term Goal 1 (Week 1): Pt will perform bed mobility at overall ModA. PT Short Term Goal 1 - Progress (Week 1): Partly met PT Short Term Goal 2 (Week 1): Pt will perform sit<>stand with overall maxA +1 PT Short Term Goal 2 - Progress (Week 1): Met PT Short Term Goal 3 (Week 1): pt will maintain sitting balance to midline with modA +1 for at least . PT Short Term Goal 3 - Progress (Week 1): Met PT Short Term Goal 4 (Week 1): pt will perform squat pivot transfer with MaxA +1 PT Short Term Goal 4 - Progress (Week 1): Met PT Short Term Goal 5 (Week 1): Pt will complete PASS for outcome measure. PT Short Term Goal 5 - Progress (Week 1): Met Week 2:  PT Short Term Goal 1 (Week 2): Pt will perform bed mobility at overall ModA. PT Short Term Goal 2 (Week 2): Pt will perform sit<>stand with overall ModA +1 PT Short Term Goal 3 (Week 2): pt will maintain sitting balance to midline with CGA +1 for at least . PT Short Term Goal 4 (Week 2): pt will perform squat pivot transfer with MinA +1   Skilled Therapeutic Interventions/Progress Updates:  Patient seated upright with slight back tilt in TIS w/c on entrance to room. Patient alert and agreeable to PT session.   Patient with no pain complaint at start of session.  Therapeutic Activity: Transfers: Pt performed squat pivot transfer w/c >mat table to L side and is able to reach seated position on EOM in 3 efforts. Requires cueing for L hand placement, forward lean, use of LLE.  Returned to w/c toward R side requiring significantly more assist   Gait Training:  Pt ambulated *** ft using *** with ***. Demonstrated ***. Provided vc/ tc for ***.  Neuromuscular Re-ed: NMR facilitated during  session with focus on***. Pt guided in ***. NMR performed for improvements in motor control and coordination, balance, sequencing, judgement, and self confidence/ efficacy in performing all aspects of mobility at highest level of independence.   Patient *** at end of session with brakes locked, *** alarm set, and all needs within reach.   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, honey thick liquids only Restrictions Weight Bearing Restrictions: No General:   Vital Signs:   Pain:     Therapy/Group: Individual Therapy  Loel Dubonnet PT, DPT, CSRS 07/10/2023, 12:17 PM

## 2023-07-10 NOTE — Progress Notes (Signed)
Speech Language Pathology Daily Session Note  Patient Details  Name: Zachary Winters MRN: [id number] Date of Birth: 1981-06-18  Today's Date: 07/10/2023 SLP Individual Time: 1345-1445 SLP Individual Time Calculation (min): 60 min  Short Term Goals: Week 2: SLP Short Term Goal 1 (Week 2): Pt will tolerate Dys 2 diet and thin liquids via Provale cup with minimal s/s of aspiration and no indications of development of aspiration pneumonia. SLP Short Term Goal 2 (Week 2): Pt will answer basic Y/N questions with >90% accuracy given min cues. SLP Short Term Goal 3 (Week 2): Pt will follow 1-step commands with >75% accuracy given min cues. SLP Short Term Goal 4 (Week 2): Pt will utilize compensatory swallow strategies to reduced or clear oral residue as needed with min assistance.  Skilled Therapeutic Interventions:   Pt greeted in bed, awake/alert upon SLP arrival. SLP facilitated a variety of tx tasks targeting language. Given approximations, he benefited from [facility] verbal/visual cues to complete automatic phrases (opposites and sentence completion). He benefited from maxA visual/verbal cues to complete simple responsive naming task and remains most stimulable to verbal lead ins to elicit responses. Approximations this session were the most accurate to date w/ increasing consonants as compared to prev tx sessions. Initial phoneme deletion remains, though middle and final positions of the word were ~80% accurate w/ modA visual/verbal cues. After verbal expression tasks, pt completed a written expression task copying his first name w/ the use of his nondominant hand. Initially DEP to copy his name, though significantly improved success noted after he was cues to trace his name and then copy it. SLP also re introduced basic communication board w/ field of 12 options for drinks. Pt required maxA visual/tactile cues to ID items x3. Will continue to implement multimodal communication to assist w/ communication  break downs and reduce pt frustration. Pt left in bed with alarm set and call light within reach. Recommend cont ST per POC.   Pain Pain Assessment Pain Scale: 0-10 Pain Score: 0-No pain  Therapy/Group: Individual Therapy  [name] 07/10/2023, 4:24 PM

## 2023-07-11 LAB — GLUCOSE, CAPILLARY
Glucose-Capillary: 159 mg/dL — ABNORMAL HIGH (ref 70–99)
Glucose-Capillary: 166 mg/dL — ABNORMAL HIGH (ref 70–99)
Glucose-Capillary: 221 mg/dL — ABNORMAL HIGH (ref 70–99)
Glucose-Capillary: 153 mg/dL — ABNORMAL HIGH (ref 70–99)

## 2023-07-11 NOTE — Plan of Care (Deleted)
  Problem: Consults Goal: RH STROKE PATIENT EDUCATION Description: See Patient Education module for education specifics  07/11/2023 1538 by Rito Ehrlich, LPN Outcome: Progressing 07/11/2023 1534 by Rito Ehrlich, LPN Outcome: Progressing   Problem: RH BOWEL ELIMINATION Goal: RH STG MANAGE BOWEL WITH ASSISTANCE Description: STG Manage Bowel with mod I Assistance. 07/11/2023 1538 by Rito Ehrlich, LPN Outcome: Progressing 07/11/2023 1534 by Rito Ehrlich, LPN Outcome: Progressing Goal: RH STG MANAGE BOWEL W/MEDICATION W/ASSISTANCE Description: STG Manage Bowel with Medication with mod I  Assistance. 07/11/2023 1538 by Rito Ehrlich, LPN Outcome: Progressing 07/11/2023 1534 by Rito Ehrlich, LPN Outcome: Progressing   Problem: RH BLADDER ELIMINATION Goal: RH STG MANAGE BLADDER WITH ASSISTANCE Description: STG Manage Bladder With toileting Assistance 07/11/2023 1538 by Rito Ehrlich, LPN Outcome: Progressing 07/11/2023 1534 by Rito Ehrlich, LPN Outcome: Progressing   Problem: RH SAFETY Goal: RH STG ADHERE TO SAFETY PRECAUTIONS W/ASSISTANCE/DEVICE Description: STG Adhere to Safety Precautions With cues Assistance/Device. 07/11/2023 1538 by Rito Ehrlich, LPN Outcome: Progressing 07/11/2023 1534 by Rito Ehrlich, LPN Outcome: Progressing   Problem: RH KNOWLEDGE DEFICIT Goal: RH STG INCREASE KNOWLEDGE OF DIABETES Description: Patient and family will be able to manage DM with medications and dietary modifications using educational resources independently 07/11/2023 1538 by Rito Ehrlich, LPN Outcome: Progressing 07/11/2023 1534 by Rito Ehrlich, LPN Outcome: Progressing Goal: RH STG INCREASE KNOWLEDGE OF HYPERTENSION Description: Patient and family will be able to manage HTN with medications and dietary modifications using educational resources independently 07/11/2023 1538 by Rito Ehrlich, LPN Outcome: Progressing 07/11/2023 1534 by  Rito Ehrlich, LPN Outcome: Progressing Goal: RH STG INCREASE KNOWLEDGE OF DYSPHAGIA/FLUID INTAKE Description: Patient and family will be able to manage Dysphagia, medications and dietary modifications using educational resources independently 07/11/2023 1538 by Rito Ehrlich, LPN Outcome: Progressing 07/11/2023 1534 by Rito Ehrlich, LPN Outcome: Progressing Goal: RH STG INCREASE KNOWLEGDE OF HYPERLIPIDEMIA Description: Patient and family will be able to manage HLD with medications and dietary modifications using educational resources independently 07/11/2023 1538 by Rito Ehrlich, LPN Outcome: Progressing 07/11/2023 1534 by Rito Ehrlich, LPN Outcome: Progressing Goal: RH STG INCREASE KNOWLEDGE OF STROKE PROPHYLAXIS Description: Patient and family will be able to manage secondary risks with medications and dietary modifications using educational resources independently 07/11/2023 1538 by Rito Ehrlich, LPN Outcome: Progressing 07/11/2023 1534 by Rito Ehrlich, LPN Outcome: Progressing

## 2023-07-11 NOTE — Progress Notes (Signed)
PROGRESS NOTE   Subjective/Complaints:  Coughs with ground meat , appetite is very good  Remains severely aphasic   ROS: limited due to language/communication   Objective:   No results found. Recent Labs    07/10/23 0658  WBC 7.9  HGB 9.9*  HCT 29.9*  PLT 338    Recent Labs    07/10/23 0658  NA 134*  K 4.2  CL 102  CO2 25  GLUCOSE 165*  BUN 18  CREATININE 1.49*  CALCIUM 8.9    Intake/Output Summary (Last 24 hours) at 07/11/2023 0810 Last data filed at 07/10/2023 1846 Gross per 24 hour  Intake 240 ml  Output 125 ml  Net 115 ml        Physical Exam: Vital Signs Blood pressure (!) 131/90, pulse 98, temperature 98.3 F (36.8 C), temperature source Oral, resp. rate 17, height 6\' 1"  (1.854 m), weight 80.6 kg, SpO2 99%.   General: No acute distress Mood and affect are appropriate Heart: Regular rate and rhythm no rubs murmurs or extra sounds Lungs: Clear to auscultation, breathing unlabored, no rales or wheezes Abdomen: Positive bowel sounds, soft nontender to palpation, nondistended Extremities: No clubbing, cyanosis, or edema Skin: No evidence of breakdown, no evidence of rash  Neurologic: Aphasic but communicates with head nods, y/n.--still.  Cranial nerves II through XII intact, motor strength is 5/5 in left and 0/5 right  deltoid, bicep, tricep, grip, hip flexor, knee extensors, ankle dorsiflexor and plantar flexor RUE extension with yawn  Flexor withdrawal spasticity present RLE, MAS 3 in Left elbow flexors Sensory exam reduced pinch sensation on RIght   Musculoskeletal: Full range of motion in all 4 extremities. No joint swelling  Assessment/Plan: 1. Functional deficits which require 3+ hours per day of interdisciplinary therapy in a comprehensive inpatient rehab setting. Physiatrist is providing close team supervision and 24 hour management of active medical problems listed below. Physiatrist  and rehab team continue to assess barriers to discharge/monitor patient progress toward functional and medical goals  Care Tool:  Bathing        Body parts bathed by helper: Right arm, Left arm, Chest, Abdomen, Front perineal area, Buttocks, Right upper leg, Left upper leg, Face, Left lower leg, Right lower leg     Bathing assist Assist Level: Dependent - Patient 0%     Upper Body Dressing/Undressing Upper body dressing   What is the patient wearing?: Pull over shirt    Upper body assist Assist Level: Total Assistance - Patient < 25%    Lower Body Dressing/Undressing Lower body dressing      What is the patient wearing?: Underwear/pull up, Incontinence brief     Lower body assist Assist for lower body dressing: Dependent - Patient 0%     Toileting Toileting    Toileting assist Assist for toileting: Dependent - Patient 0%     Transfers Chair/bed transfer  Transfers assist  Chair/bed transfer activity did not occur: Safety/medical concerns  Chair/bed transfer assist level: Dependent - mechanical lift (STEDY)     Locomotion Ambulation   Ambulation assist      Assist level: Maximal Assistance - Patient 25 - 49% Assistive device: Other (comment) (L  hallway handrail) Max distance: 35 ft   Walk 10 feet activity   Assist     Assist level: Maximal Assistance - Patient 25 - 49% Assistive device: Other (comment) (L hallway hand rail)   Walk 50 feet activity   Assist Walk 50 feet with 2 turns activity did not occur: Safety/medical concerns         Walk 150 feet activity   Assist Walk 150 feet activity did not occur: Safety/medical concerns         Walk 10 feet on uneven surface  activity   Assist Walk 10 feet on uneven surfaces activity did not occur: Safety/medical concerns         Wheelchair     Assist Is the patient using a wheelchair?: Yes Type of Wheelchair: Manual    Wheelchair assist level: Dependent - Patient 0% Max  wheelchair distance: 150 ft    Wheelchair 50 feet with 2 turns activity    Assist        Assist Level: Dependent - Patient 0%   Wheelchair 150 feet activity     Assist      Assist Level: Dependent - Patient 0%   Blood pressure (!) 131/90, pulse 98, temperature 98.3 F (36.8 C), temperature source Oral, resp. rate 17, height 6\' 1"  (1.854 m), weight 80.6 kg, SpO2 99%.  Medical Problem List and Plan: 1. 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 Severe Left M1 and V4 stenosis              -patient may  shower             -ELOS/Goals: 7/30 min A for PT, OT and SLP   -Continue CIR therapies including PT, OT, and SLP - team conf in am    2.  Antithrombotics: -DVT/anticoagulation:  Pharmaceutical: Lovenox, LE doppler neg              -antiplatelet therapy: DAPT for at leasat  3 months. Has declined stenting.   3. Pain Management: Tylenol prn.  4. Mood/Behavior/Sleep: LCSW to follow   for evaluation and support when appropriate.              -antipsychotic agents: N/A 5. Neuropsych/cognition: This patient is not capable of making decisions on his own behalf. 6. Skin/Wound Care: Routine pressure relief measures.  7. Fluids/Electrolytes/Nutrition:   -intake a little more consistent yesterday  -continue IVF at [facility]  -labs pending for 7/15 8. 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 HTN: Monitor BP TID--long term goal normotensive             --continue amlodipine 10 mg/day and coreg  6.25 mg BID.  Vitals:   07/10/23 2134 07/11/23 0514  BP: 132/86 (!) 131/90  Pulse: 94 98  Resp: 17 17  Temp: 98.5 F (36.9 C) 98.3 F (36.8 C)  SpO2: 99% 99%   Keep in 140-160 sys range-- at target with systolic , may improve off IVF   10. T2DM w/peripheral neuropathy: Hgb A1c- 11.7. Was on Lantus 30 units PTA?  --Continue Insulin  glargline-->was decreased to  5 units/HS today(to start tomorrow as received 10 units this am).   --Hold every 4 hours novolog as not on TF at this moment.  CBG (last 3)  Recent Labs    07/10/23 1642 07/10/23 2136 07/11/23 0614  GLUCAP 152* 195* 159*   Increased  Semglee to 15U 7/7 Change SSI to moderate  7/14 increase semglee to 22 u d/t persistent elevation 7/16 good control  11. Acute on chronic renal failure?:  Improved with IVF for hydration-->was d/c yesterday but will resume with questionable intake --.SCr 1.02 in 09/22 per chart review. SCr 1.57 @ admission-->1.29-->1.31  PO4 and Mg ok 7/7 IVF HS, recheck Monday 12. Severe dysphagia: Continue D1, honey liquids. Willing to take liquids-->will offer thickened Glucerna with meals for now.         13. Hyperlipidemia: LDL 265. On Lipitor 80 mg daily. 14. Severe vitamin D deficiency: On Ergocalciferol weekly with dialy low supplement.  15. ABLA: Hgb 13.1 @ admission-->11.1-->9.2. Will order stool guaiacs- none collected 7/5.                 Latest Ref Rng & Units 07/10/2023    6:58 AM 07/03/2023    9:23 AM 06/28/2023    5:52 AM  CBC  WBC 4.0 - 10.5 K/uL 7.9  9.9  10.2   Hemoglobin 13.0 - 17.0 g/dL 9.9  16.1  09.6   Hematocrit 39.0 - 52.0 % 29.9  32.4  30.4   Platelets 150 - 400 K/uL 338  361  295    Fluctuates 10-11 g/dL range 16. Low grade fevers: Encourage pulmonary hygiene.  17. Hx tobacco/Marijuana use: Continue  nicotine patch.  Encourage cessation of Marijuana.  18. Intermittent episodes of N/V: Question due to gastroparesis (Multiple ED visits in the past for intractable N/V). 19. Nasal congestion- ordered Flonase 1 sprays daily.  20.  Developing severe spasticity RLE>RUE   7/8-tizanidine just increased 7/7--obsv today 21.  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  Will continue n.p.o. status and have dietary reinsert feeding tube.  We will consult interventional radiology to assess for percutaneous  gastrostomy tube placement I discussed this over the phone with the patient's sister Cameron Proud [phone].  She is in favor of placement if needed, given improvemnet over the last 24h after diet upgrade can hold off on scheduling, not even sure if anatomy would allow PEG,        Latest Ref Rng & Units 07/10/2023    6:58 AM 07/07/2023    6:35 AM 07/04/2023    5:37 AM  BMP  Glucose 70 - 99 mg/dL 147  829  562   BUN 6 - 20 mg/dL 18  15  17    Creatinine 0.61 - 1.24 mg/dL 1.30  8.65  7.84   Sodium 135 - 145 mmol/L 134  135  137   Potassium 3.5 - 5.1 mmol/L 4.2  4.5  4.2   Chloride 98 - 111 mmol/L 102  103  103   CO2 22 - 32 mmol/L 25  23  26    Calcium 8.9 - 10.3 mg/dL 8.9  8.8  8.8     Stable 7/15, d/c IVF  Recheck BMET in Friday  LOS: 14 days A FACE TO FACE EVALUATION WAS PERFORMED  Erick Colace 07/11/2023, 8:10 AM

## 2023-07-11 NOTE — Plan of Care (Signed)
  Problem: Consults Goal: RH STROKE PATIENT EDUCATION Description: See Patient Education module for education specifics  Outcome: Progressing   Problem: RH BOWEL ELIMINATION Goal: RH STG MANAGE BOWEL WITH ASSISTANCE Description: STG Manage Bowel with mod I Assistance. Outcome: Progressing Goal: RH STG MANAGE BOWEL W/MEDICATION W/ASSISTANCE Description: STG Manage Bowel with Medication with mod I  Assistance. Outcome: Progressing   Problem: RH BLADDER ELIMINATION Goal: RH STG MANAGE BLADDER WITH ASSISTANCE Description: STG Manage Bladder With toileting Assistance Outcome: Progressing   Problem: RH SAFETY Goal: RH STG ADHERE TO SAFETY PRECAUTIONS W/ASSISTANCE/DEVICE Description: STG Adhere to Safety Precautions With cues Assistance/Device. Outcome: Progressing   Problem: RH KNOWLEDGE DEFICIT Goal: RH STG INCREASE KNOWLEDGE OF DIABETES Description: Patient and family will be able to manage DM with medications and dietary modifications using educational resources independently Outcome: Progressing Goal: RH STG INCREASE KNOWLEDGE OF HYPERTENSION Description: Patient and family will be able to manage HTN with medications and dietary modifications using educational resources independently Outcome: Progressing Goal: RH STG INCREASE KNOWLEDGE OF DYSPHAGIA/FLUID INTAKE Description: Patient and family will be able to manage Dysphagia, medications and dietary modifications using educational resources independently Outcome: Progressing Goal: RH STG INCREASE KNOWLEGDE OF HYPERLIPIDEMIA Description: Patient and family will be able to manage HLD with medications and dietary modifications using educational resources independently Outcome: Progressing Goal: RH STG INCREASE KNOWLEDGE OF STROKE PROPHYLAXIS Description: Patient and family will be able to manage secondary risks with medications and dietary modifications using educational resources independently Outcome: Progressing

## 2023-07-11 NOTE — Progress Notes (Signed)
Occupational Therapy Session Note  Patient Details  Name: Zachary Winters MRN: [id number] Date of Birth: January 27, 1981  Today's Date: 07/11/2023 OT Individual Time: 4098-1191 OT Individual Time Calculation (min): 73 min    Short Term Goals: Week 2:  OT Short Term Goal 1 (Week 2): pt will complete squat pivot transfers with MOD A +2  as precursor to safe toilet transfers OT Short Term Goal 2 (Week 2): pt will complete sit>stands with MODA +2 to improve pts ability to sit>stand for LB dressing OT Short Term Goal 3 (Week 2): pt will sit EOB with CGA for > 5 mins as precursor to higher level ADLs from EOB  Skilled Therapeutic Interventions/Progress Updates:  Pt greeted supine in bed, pt agreeable to OT intervention. Noted pt with incontinent BM, pt required total A for pericare with pt able to roll R<>L with MINA. Donned shorts with MAX A from supine, pt was able to assist with pulling pants to waist line via bridging. Pt completed supine>sit with MODA able to carryover hemi technique of hooking LLE under RLE. Pt completed squat pivot to w/c to L side with MOD A +2.   Total a transport to gym where remainder of session focused on RLE/RUE NMR. Pt completed multiple sit>stands from EOM with an emphasis on anterior weight shifting to power into standing. Pt counting into standing this session!  Pt completed stands with MODA +2 for safety with RLE blocked, mirror provided for visual feedback with an emphasis on elevating trunk and glute activation in standing.    Additionally worked on repeated squat pivot transfers from EOM<> TIS to R and L side. Emphasis on breaking down steps and head/hips relationship with pt noted to have difficulty with motor planning needing max multimodal cues.          Additionally worked on PROM to RUE elbow flexors/extensors to decrease tone/spasticity, pt also completed active assist ROM with RUE secured to unweighted dowel  with pt completing chest presses and shoulder flexion  to 90* only, emphasis on counting reps to improve communication.      Ended session with pt seated in w/c with all needs within reach and safety belt alarm activated.                    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 Restrictions Weight Bearing Restrictions: No  Pain: No indications of pain noted.   Therapy/Group: Individual Therapy  Pollyann Glen [facility] 07/11/2023, 1:15 PM

## 2023-07-11 NOTE — Progress Notes (Signed)
Speech Language Pathology Daily Session Note  Patient Details  Name: Zachary Winters MRN: [id number] Date of Birth: 03/24/1981  Today's Date: 07/11/2023 SLP Individual Time: 0900-1000 SLP Individual Time Calculation (min): 60 min  Short Term Goals: Week 2: SLP Short Term Goal 1 (Week 2): Pt will tolerate Dys 2 diet and thin liquids via Provale cup with minimal s/s of aspiration and no indications of development of aspiration pneumonia. SLP Short Term Goal 2 (Week 2): Pt will answer basic Y/N questions with >90% accuracy given min cues. SLP Short Term Goal 3 (Week 2): Pt will follow 1-step commands with >75% accuracy given min cues. SLP Short Term Goal 4 (Week 2): Pt will utilize compensatory swallow strategies to reduced or clear oral residue as needed with min assistance.  Skilled Therapeutic Interventions:   Pt greeted in bed, awake/alert upon SLP arrival. SLP facilitated a variety of tx tasks targeting language. Pt completed written unscrambling task w/ automatics, requiring only spv verbal cues to sequence the months of the year. He INDly completed 1-15 and dow as well as task matching the numbers to the written word. Given approximations, he verbalized 1-15 and dow with modA visual/verbal cues. MaxA required for months of the year 2* multisyllabic words. SLP then facilitated a multimodal communication task, using the communication board provided previously as reference for correct spelling. Pt was able to ID the item on board, then SLP provided verbal spelling. Attempted to use a letter board to spell items as well, though pt demonstrated increased success w/ writing the word out given letters vs pointing on the letter board. Pt was able to ID errors during written expression portion of the task, but required modA visual/verbal cues to correct errors given aphasia. Pt was able to copy his first and last name INDly. Additionally, he answered biographical questions (with approximations) w/ minA  verbal cues. For the final tx task, pt followed simple 2 step directions w/ modA visual/verbal cues. Suspect apraxia continues to limit his success as well 2* groping movements. Pt left in bed with alarm set; call light within reach. Plan to trial Dys 3 breakfast tray 7/17 and upgrade diet as appropriate. Recommend cont ST per POC.   Pain  Nodded "no" to pain. Appeared comfortable.   Therapy/Group: Individual Therapy  [name] 07/11/2023, 4:57 PM

## 2023-07-12 LAB — GLUCOSE, CAPILLARY
Glucose-Capillary: 123 mg/dL — ABNORMAL HIGH (ref 70–99)
Glucose-Capillary: 204 mg/dL — ABNORMAL HIGH (ref 70–99)
Glucose-Capillary: 219 mg/dL — ABNORMAL HIGH (ref 70–99)
Glucose-Capillary: 285 mg/dL — ABNORMAL HIGH (ref 70–99)

## 2023-07-12 MED ORDER — CLOPIDOGREL BISULFATE 75 MG PO TABS
75.0000 mg | ORAL_TABLET | Freq: Every day | ORAL | Status: DC
  Administered 2023-07-13 – 2023-07-25 (×13): 75 mg via ORAL
  Filled 2023-07-12 (×13): qty 1

## 2023-07-12 MED ORDER — ENSURE ENLIVE PO LIQD
237.0000 mL | Freq: Two times a day (BID) | ORAL | Status: DC
  Administered 2023-07-12 – 2023-07-19 (×2): 237 mL via ORAL

## 2023-07-12 MED ORDER — ORAL CARE MOUTH RINSE
15.0000 mL | OROMUCOSAL | Status: DC
  Administered 2023-07-12 – 2023-07-25 (×43): 15 mL via OROMUCOSAL

## 2023-07-12 MED ORDER — ORAL CARE MOUTH RINSE: 15.0000 mL | OROMUCOSAL | Status: DC | PRN

## 2023-07-12 MED ORDER — ASPIRIN 81 MG PO CHEW
81.0000 mg | CHEWABLE_TABLET | Freq: Every day | ORAL | Status: DC
  Administered 2023-07-13 – 2023-07-25 (×13): 81 mg via ORAL
  Filled 2023-07-12 (×13): qty 1

## 2023-07-12 NOTE — Progress Notes (Signed)
Nutrition Follow-up  DOCUMENTATION CODES:   Not applicable  INTERVENTION:  - Modify EM to Ensure Enlive po BID, each supplement provides 350 kcal and 20 grams of protein.  NUTRITION DIAGNOSIS:   Inadequate oral intake related to lethargy/confusion, dysphagia as evidenced by meal completion < 25%.  GOAL:   Patient will meet greater than or equal to 90% of their needs  MONITOR:   PO intake  REASON FOR ASSESSMENT:   New TF Calorie Count  ASSESSMENT:   42 y.o. 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 PMH includes: DM.  Meds reviewed: lipitor, sliding scale insulin, semglee (22 units), MVI, senokot, Vit D. Labs reviewed: Na low.   Pt continues with Dys 3 diet. Per record, pt ate 100% of his breakfast this am. Per record, pt has likely been eating an average of 60% of his meals. Pt currently has Ensure Max ordered TID and has been refusing most of them. RD will modify to Ensure Enlive BID to provide more calories. RD will continue to monitor PO intakes and POC.   Diet Order:   Diet Order             DIET DYS 3 Room service appropriate? Yes; Fluid consistency: Thin  Diet effective 0500                   EDUCATION NEEDS:   Not appropriate for education at this time  Skin:  Skin Assessment: Reviewed RN Assessment  Last BM:  7/15 - type 6  Height:   Ht Readings from Last 1 Encounters:  06/27/23 6\' 1"  (1.854 m)    Weight:   Wt Readings from Last 1 Encounters:  07/09/23 80.6 kg    Ideal Body Weight:     BMI:  Body mass index is 23.44 kg/m.  Estimated Nutritional Needs:   Kcal:  2130-2550 kcals  Protein:  105-125 gm  Fluid:  2.1-2.5 L  Bethann Humble, RD, LDN, CNSC.

## 2023-07-12 NOTE — Progress Notes (Signed)
Speech Language Pathology Weekly Progress and Session Note  Patient Details  Name: Zachary Winters MRN: [id number] Date of Birth: [birth year]  Beginning of progress report period: July 05, 2023 End of progress report period: July 12, 2023  Today's Date: 07/12/2023 SLP Individual Time: 0805-0910 SLP Individual Time Calculation (min): 65 min  Short Term Goals: Week 2: SLP Short Term Goal 1 (Week 2): Pt will tolerate Dys 2 diet and thin liquids via Provale cup with minimal s/s of aspiration and no indications of development of aspiration pneumonia. SLP Short Term Goal 1 - Progress (Week 2): Met SLP Short Term Goal 2 (Week 2): Pt will answer basic Y/N questions with >90% accuracy given min cues. SLP Short Term Goal 2 - Progress (Week 2): Met SLP Short Term Goal 3 (Week 2): Pt will follow 1-step commands with >75% accuracy given min cues. SLP Short Term Goal 3 - Progress (Week 2): Met SLP Short Term Goal 4 (Week 2): Pt will utilize compensatory swallow strategies to reduced or clear oral residue as needed with min assistance. SLP Short Term Goal 4 - Progress (Week 2): Met    New Short Term Goals: Week 3: SLP Short Term Goal 1 (Week 3): Pt will tolerate Dys 3 diet and thin liquids with minimal s/s of aspiration and no indications of development of aspiration pneumonia SLP Short Term Goal 2 (Week 3): Pt will follow simple one step directions with supervision visual/verbal cues SLP Short Term Goal 3 (Week 3): Pt will complete functional naming tasks (automatic responses, responsive naming, simple confrontational naming, etc) with 80% accuracy given modA visual/verbal cues SLP Short Term Goal 4 (Week 3): Pt will utilize multimodal communication to express simple wants/needs with modA visual/verbal cues SLP Short Term Goal 5 (Week 3): Pt will consistently utilize safe swallow strategies during PO intake w/ supervision cues.  Weekly Progress Updates:  Pt demonstrated excellent progress towards ST  goals this week as he met 4/4 short term goals. He demonstrates improved accuracy with functional single word responses and auditory comprehension of 1 step commands/simple conversation. Approximations vs whole word responses continue, though approximations typically include middle and final phonemes at this time. This is improved from initial approximations of vowels only. He also demonstrates improved strength and ROM of oral structures as well as increased sensation of buccal pocketing, which has assisted w/ overall oral clearance and ability to tolerate textures of increased complexity. Upgraded to Dys 3 diet on 7/17. Pt/family education ongoing at this time and pt would benefit from continued ST services to target remaining oropharyngeal dysphagia, apraxia, and aphasia to maximize pt independence/reduce caregiver burden.   Intensity: Minumum of 1-2 x/day, 30 to 90 minutes Frequency: 3 to 5 out of 7 days Duration/Length of Stay: 18-22 days Treatment/Interventions: Speech/Language facilitation;Therapeutic Activities;Cueing hierarchy;Therapeutic Exercise;Dysphagia/aspiration precaution training;Patient/family education   Daily Session  Skilled Therapeutic Interventions: Pt greeted in bed, awake/alert upon SLP arrival. Tx session focused on dysphagia and SLP provided breakfast tray of dysphagia 3 textures and thin liquids. Also trialed thin liquids via cup vs provale cup. Pt observed w/ pancakes, breakfast potatoes, sasauge, eggs, and banana. He continues to require additional time for mastication and to clear oral residue/R buccal pocketing, however, increased awareness noted throughout meal. He benefited from only spv cues to maintain slow rate, bite size, and utilize intermittent liquid washes to clear oral cavity. Additionally, adequate sip size and rate of intake noted w/ thin liquids. No overt s/s of airway invasion noted throughout meal. Recommend upgrade  to dys 3 textures at this time.  Discontinue use of provale cup. Pt requested to continue w/ meds crushed in puree for now, though, hopeful to upgrade that soon as well. Pt left upright in bed with alarm set. Call light within reach. Recommend cont ST per POC.      Pain Pain Assessment Pain Scale: 0-10 Pain Score: 0-No pain  Therapy/Group: Individual Therapy  [name] 07/12/2023, 10:41 AM

## 2023-07-12 NOTE — Progress Notes (Signed)
Patient ID: Zachary Winters, male   DOB: Sep 02, 1981, 42 y.o.   MRN: [id number]  Team Conference Report to Patient/Family  Team Conference discussion was reviewed with the patient and caregiver, including goals, any changes in plan of care and target discharge date.  Patient and caregiver express understanding and are in agreement.  The patient has a target discharge date of 07/25/23.   Sw met with patient and spoke with sister, Zachary Winters via telephone. Patient making progress. IC d/c, patient on D3 diet, think liquids using cups without straws. Sister has confirmed that family will be present for education on Saturday 7/20 1 PM-4 PM. SW will conform recommendations with sister on 7/22. No additional questions or concerns. Andria Rhein 07/12/2023, 1:06 PM

## 2023-07-12 NOTE — Progress Notes (Signed)
Physical Therapy Session Note  Patient Details  Name: Zachary Winters MRN: [id number] Date of Birth: 1981-04-20  Today's Date: 07/11/2023 PT Individual Time: 1340-1500 PT Individual Time Calculation (min): 80 min  Short Term Goals: Week 2:  PT Short Term Goal 1 (Week 2): Pt will perform bed mobility at overall ModA. PT Short Term Goal 2 (Week 2): Pt will perform sit<>stand with overall ModA +1 PT Short Term Goal 3 (Week 2): pt will maintain sitting balance to midline with CGA +1 for at least . PT Short Term Goal 4 (Week 2): pt will perform squat pivot transfer with MinA +1   Skilled Therapeutic Interventions/Progress Updates:  Patient seated upright in TIS w/c with back tilt on entrance to room. Patient alert and agreeable to PT session. Repositioned BLE onto leg rests with pt appreciative.  Patient with no pain complaint at start of session.  Therapeutic Activity: Transfers: Pt performed squat pivot transfers throughout session toward L side to transfer from  TIS w/c to standard with ModA and+2 for safety. Requires hand over hand positioning of L hand to standard w/c armrest. Provided verbal cues for setup of positioning and then pt counts to "3" for initiation. At end of session, pt performs stand pivot transfer to L side to return to bed and is able to perform with Min/ ModA.   Wheelchair Mobility:  Standard 18x18 w/c acquired for pt and setup for hemitechnique. Initially pt only uses UE and frustrated. Educated on one sided UE use producing circular movement requiring LE to maintain straight path. Pt requires hand over knee assist for use of LE in coordination with UE. Continued dys-coordination initially with significant vc/ tc for use and timing of LE. Pt is able to improve coordination and guided in CW path around main gym and into hallway with R turns required. Is able to complete with CGA initially and improves to supervision. Reversed path around main gym with L turns required  and increased difficulty for pt initially and then able to improve again to supervision with ability to maintain straight path with minor deviations and good corrections. VC for turning head to look at one obstacle that pt runs into on R side requiring significant cueing to turn away from.   Gait Training:  Pt ambulated 38 ft using L hallway HR with ModA for balance and MaxA for RLE mgmt in advancement and block from buckling. Demonstrated improving upright posture but continues to need hand hold to steady surface to maintain upright positioning. Provided facilitation for lateral weight shift and hard heel strike  in order to attempt to improved sensation and proprioception/ activation in LE advancement. VC for sequencing throughout.   Assessed pt in stance with use of EVA walker for potential gait. Adjusted height for pt's frame for elbow support bilaterally. Required MaxA for EVA walker mgmt from +2 and MaxA for RLE mgmt in advancement and block from buckling to cover 5' safely. Pt able to manage L hemibody.  Neuromuscular Re-ed: NMR facilitated during session with focus on sitting balance then standing balance. Pt's sitting balance is improving to SBA for most movements but still requiring int MinA for reaching to far R. Standing continues to require minA for rise to stand but MaxA overall to maintain upright posture and reduce significant lean to R side. In standing pt will reach out to nearest object for LUE support. NMR performed for improvements in motor control and coordination, balance, sequencing, judgement, and self confidence/ efficacy in performing all aspects  of mobility at highest level of independence.   Attended Estim: 4"x2" pads attached to alcohol cleaned quad over prox rectus femoris and distal rectus femoris to VMO. [place] unit set to NMES for large muscle groups. Pt unable to sense any stimulation and muscle tension not noted until max intensity reached at 52mA. Run time 10 min  with 10sec on and 30 sec off. Pt performs Bil LAQ with PROM to RLE during on times and rest during off times. LAQ performed with activation noted at quad muscle. Tolerated well with no redness or other skin issues noted after removing electrodes.   Patient supine in bed at end of session with brakes locked, bed alarm set, and all needs within reach.   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 Restrictions Weight Bearing Restrictions: No General:   Vital Signs:   Pain:  No pain related/ indicated this session.   Therapy/Group: Individual Therapy  Loel Dubonnet PT, DPT, CSRS 07/11/2023, 5:31 PM

## 2023-07-12 NOTE — Patient Care Conference (Signed)
Inpatient RehabilitationTeam Conference and Plan of Care Update Date: 07/12/2023   Time: 10:17 AM    Patient Name: Zachary Winters      Medical Record Number: [id number]  Date of Birth: 1981/06/11 Sex: Male         Room/Bed: 4W13C/4W13C-01 Payor Info: Payor: [place] MEDICAID PREPAID HEALTH PLAN / Plan: [place] MEDICAID HEALTHY BLUE / Product Type: *No Product type* /    Admit Date/Time:  06/27/2023  4:33 PM  Primary Diagnosis:  Acute ischemic left middle cerebral artery (MCA) stroke [facility])  Hospital Problems: Principal Problem:   Acute ischemic left middle cerebral artery (MCA) stroke (HCC) Active Problems:   Hypertension associated with diabetes (HCC)   Type 2 diabetes mellitus with diabetic neuropathy (HCC)   Hyperlipidemia associated with type 2 diabetes mellitus (HCC)   Aphasia   Apraxia   H/O nausea and vomiting    Expected Discharge Date: Expected Discharge Date: 07/25/23  Team Members Present: Physician leading conference: Dr. Claudette Laws Social Worker Present: Lavera Guise, BSW Nurse Present: Chana Bode, RN PT Present: Wynelle Link, PT;Dominic Sandoval, PTA OT Present: Bonnell Public, OT SLP Present: Everardo Pacific, SLP PPS Coordinator present : Fae Pippin, SLP     Current Status/Progress Goal Weekly Team Focus  Bowel/Bladder   pt incontinent of b/b; LBM 7/16   regain continence of b/b   assist with toileting needs prn    Swallow/Nutrition/ Hydration   upgraded to Dys 3 7/17. Also cleared to use regular cup for thin liquids vs provale. No straws at this time.   supervision  increased awareness of oral residue, initiation to clear residue INDly, and upgrade to Dys 3/thin liquids via cup    ADL's   pt's participation has improved and pt has made improvements with postural control/balance (fluctuates between CGA and mod A), transfers mod A of 2,  mod - max bathing and UB dressing,  total LB dressing and toileting, increased tone in RUE with no  functional use.   MIN- MOD goals   functional transfers, sitting balance, BADL reeducation, RUE PROM/NMR. family education to reduce burden of care.    Mobility   bed mobility = Min/ ModA for rolling, Mod/ MaxA for supine<>sit ; squat pivot = ModA with significant cues, sit<>stand = MinA with LUE support, MaxA with no support and reaching out for nearest  furniture to steady self, ambulation = MaxA   CGA/ MinA for bed mobility and transfers, ModA for ambulation - need downgrading  Barriers: LOA family can provide in functional transfers, R hemibody flexor tone, impulsivity /// work on: sitting/ tanding balance, improving bed mobility, transfer LOA, establish w/c eval and AFO consult, NMES for R quad in attempt to overpower flexor tone in hamstrings, NMR for R hemibody, family education    Communication   maxA for communication overall 2* aphasia/apraxia, though 1 word responses increased to modA in very functional speech tasks   consistent modA for verbal expression   use of communication board/gestures for spontaneous wants/needs or thoughts/ideas, functional naming task re hospital/home environment    Safety/Cognition/ Behavioral Observations  functional cognition appears WFL - unable to truly assess given severity of aphasia/apraxia            Pain   no c/o pain   remain pain free   assess pain QS and prn    Skin   skin intact   maintain skin integrity  assess skin QS and prn      Discharge Planning:  Discharging home  with sister, step father and family to assist. Grandmother providing supervision. Family education 7/20 1-4 PM. Barrier HH: due to insurance. Level entry home.   Team Discussion: Patient with dysphagia and dehydration, postural control deficits, impulsivity, flexor tone on left side, apraxia, and aphasia post left MCA CVA.  Patient on target to meet rehab goals: no, currently needs CGA - mod assist for sitting balance, transfers with mod assist +2. Needs mod -  max assist for upper body care and total for lower body care. Needs mod - max assist for supine - sit; impulsivity corrected with cues.  Needs max assist for communication. Goals for discharge set for min - mod assist overall.  *See Care Plan and progress notes for long and short-term goals.   Revisions to Treatment Plan:  MBS; diet advanced to dysphagia 3 thin liquids, no straws IVF for dehydration  Downgraded goals AFO consult Communication board  Teaching Needs: Safety, medications/insulin administration, CBG monitoring, dietary modification, transfers, toileting, etc.   Current Barriers to Discharge: Decreased caregiver support and Home enviroment access/layout  Possible Resolutions to Barriers: Family education     Medical Summary Current Status: BP controlled , dysphagia coughing with meals, liquid intake poor/fair  Barriers to Discharge: Renal Insufficiency/Failure;Inadequate Nutritional Intake   Possible Resolutions to [employer] Focus: cont to monitor intake , encourage fluids , bowel adn bladder timing   Continued Need for Acute Rehabilitation Level of Care: The patient requires daily medical management by a physician with specialized training in physical medicine and rehabilitation for the following reasons: Direction of a multidisciplinary physical rehabilitation program to maximize functional independence : Yes Medical management of patient stability for increased activity during participation in an intensive rehabilitation regime.: Yes Analysis of laboratory values and/or radiology reports with any subsequent need for medication adjustment and/or medical intervention. : Yes   I attest that I was present, lead the team conference, and concur with the assessment and plan of the team.   Chana Bode B 07/12/2023, 2:53 PM

## 2023-07-12 NOTE — Progress Notes (Signed)
PROGRESS NOTE   Subjective/Complaints:  Diet upgraded to D3  Remains severely aphasic   ROS: limited due to language/communication   Objective:   No results found. Recent Labs    07/10/23 0658  WBC 7.9  HGB 9.9*  HCT 29.9*  PLT 338    Recent Labs    07/10/23 0658  NA 134*  K 4.2  CL 102  CO2 25  GLUCOSE 165*  BUN 18  CREATININE 1.49*  CALCIUM 8.9    Intake/Output Summary (Last 24 hours) at 07/12/2023 1015 Last data filed at 07/12/2023 0900 Gross per 24 hour  Intake 720 ml  Output --  Net 720 ml        Physical Exam: Vital Signs Blood pressure 135/83, pulse 96, temperature 98.8 F (37.1 C), resp. rate 17, height 6\' 1"  (1.854 m), weight 80.6 kg, SpO2 100%.   General: No acute distress Mood and affect are appropriate Heart: Regular rate and rhythm no rubs murmurs or extra sounds Lungs: Clear to auscultation, breathing unlabored, no rales or wheezes Abdomen: Positive bowel sounds, soft nontender to palpation, nondistended Extremities: No clubbing, cyanosis, or edema Skin: No evidence of breakdown, no evidence of rash  Neurologic: Aphasic but communicates with head nods, y/n.--still.  Cranial nerves II through XII intact, motor strength is 5/5 in left and 0/5 right  deltoid, bicep, tricep, grip, hip flexor, knee extensors, ankle dorsiflexor and plantar flexor RUE extension with yawn  Flexor withdrawal spasticity present RLE, MAS 3 in Left elbow flexors Sensory exam reduced pinch sensation on RIght   Musculoskeletal: Full range of motion in all 4 extremities. No joint swelling  Assessment/Plan: 1. Functional deficits which require 3+ hours per day of interdisciplinary therapy in a comprehensive inpatient rehab setting. Physiatrist is providing close team supervision and 24 hour management of active medical problems listed below. Physiatrist and rehab team continue to assess barriers to  discharge/monitor patient progress toward functional and medical goals  Care Tool:  Bathing        Body parts bathed by helper: Right arm, Left arm, Chest, Abdomen, Front perineal area, Buttocks, Right upper leg, Left upper leg, Face, Left lower leg, Right lower leg     Bathing assist Assist Level: Dependent - Patient 0%     Upper Body Dressing/Undressing Upper body dressing   What is the patient wearing?: Pull over shirt    Upper body assist Assist Level: Total Assistance - Patient < 25%    Lower Body Dressing/Undressing Lower body dressing      What is the patient wearing?: Underwear/pull up, Incontinence brief     Lower body assist Assist for lower body dressing: Dependent - Patient 0%     Toileting Toileting    Toileting assist Assist for toileting: Dependent - Patient 0%     Transfers Chair/bed transfer  Transfers assist  Chair/bed transfer activity did not occur: Safety/medical concerns  Chair/bed transfer assist level: Dependent - mechanical lift (STEDY)     Locomotion Ambulation   Ambulation assist      Assist level: Maximal Assistance - Patient 25 - 49% Assistive device: Other (comment) (L hallway handrail) Max distance: 35 ft   Walk 10  feet activity   Assist     Assist level: Maximal Assistance - Patient 25 - 49% Assistive device: Other (comment) (L hallway hand rail)   Walk 50 feet activity   Assist Walk 50 feet with 2 turns activity did not occur: Safety/medical concerns         Walk 150 feet activity   Assist Walk 150 feet activity did not occur: Safety/medical concerns         Walk 10 feet on uneven surface  activity   Assist Walk 10 feet on uneven surfaces activity did not occur: Safety/medical concerns         Wheelchair     Assist Is the patient using a wheelchair?: Yes Type of Wheelchair: Manual    Wheelchair assist level: Dependent - Patient 0% Max wheelchair distance: 150 ft    Wheelchair 50  feet with 2 turns activity    Assist        Assist Level: Dependent - Patient 0%   Wheelchair 150 feet activity     Assist      Assist Level: Dependent - Patient 0%   Blood pressure 135/83, pulse 96, temperature 98.8 F (37.1 C), resp. rate 17, height 6\' 1"  (1.854 m), weight 80.6 kg, SpO2 100%.  Medical Problem List and Plan: 1. 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 Severe Left M1 and V4 stenosis              -patient may  shower             -ELOS/Goals: 7/30 min A for PT, OT and SLP   -Continue CIR therapies including PT, OT, and SLP - team conf in am    2.  Antithrombotics: -DVT/anticoagulation:  Pharmaceutical: Lovenox, LE doppler neg              -antiplatelet therapy: DAPT for at leasat  3 months. Has declined stenting.   3. Pain Management: Tylenol prn.  4. Mood/Behavior/Sleep: LCSW to follow   for evaluation and support when appropriate.              -antipsychotic agents: N/A 5. Neuropsych/cognition: This patient is not capable of making decisions on his own behalf. 6. Skin/Wound Care: Routine pressure relief measures.  7. Fluids/Electrolytes/Nutrition:   -intake a little more consistent yesterday  -continue [facility] at [facility]  -labs pending for 7/15 8. 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 HTN: Monitor BP TID--long term goal normotensive             --continue amlodipine 10 mg/day and coreg  6.25 mg BID.  Vitals:   07/12/23 0110 07/12/23 0614  BP: 131/85 135/83  Pulse: 95 96  Resp: 17 17  Temp: 98.6 F (37 C) 98.8 F (37.1 C)  SpO2: 100% 100%   Keep in 130-150 range  10. T2DM w/peripheral neuropathy: Hgb A1c- 11.7. Was on Lantus 30 units PTA?  --Continue Insulin glargline-->was decreased to  5 units/HS today(to start tomorrow as received 10 units this am).   --Hold every 4 hours novolog as not on TF  at this moment.  CBG (last 3)  Recent Labs    07/11/23 1627 07/11/23 2204 07/12/23 0619  GLUCAP 221* 153* 123*   Increased Semglee to 15U 7/7 Change SSI to moderate  7/14 increase semglee to 22 u d/t persistent elevation 7/17 good control  11. Acute on chronic  renal failure?:  Improved with IVF for hydration-->was d/c yesterday but will resume with questionable intake --.SCr 1.02 in 09/22 per chart review. SCr 1.57 @ admission-->1.29-->1.31  PO4 and Mg ok 7/7 IVF HS, recheck Monday 12. Severe dysphagia: Continue D1, honey liquids. Willing to take liquids-->will offer thickened Glucerna with meals for now.         13. Hyperlipidemia: LDL 265. On Lipitor 80 mg daily. 14. Severe vitamin D deficiency: On Ergocalciferol weekly with dialy low supplement.  15. ABLA: Hgb 13.1 @ admission-->11.1-->9.2. Will order stool guaiacs- none collected 7/5.                 Latest Ref Rng & Units 07/10/2023    6:58 AM 07/03/2023    9:23 AM 06/28/2023    5:52 AM  CBC  WBC 4.0 - 10.5 K/uL 7.9  9.9  10.2   Hemoglobin 13.0 - 17.0 g/dL 9.9  16.1  09.6   Hematocrit 39.0 - 52.0 % 29.9  32.4  30.4   Platelets 150 - 400 K/uL 338  361  295    Fluctuates 10-11 g/dL range 16. Low grade fevers: Encourage pulmonary hygiene.  17. Hx tobacco/Marijuana use: Continue  nicotine patch.  Encourage cessation of Marijuana.  18. Intermittent episodes of N/V: Question due to gastroparesis (Multiple ED visits in the past for intractable N/V). 19. Nasal congestion- ordered Flonase 1 sprays daily.  20.  Developing severe spasticity RLE>RUE   7/8-tizanidine just increased 7/7--obsv today 21.  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  Will continue n.p.o. status and have dietary reinsert feeding tube.  We will consult interventional radiology to assess for percutaneous gastrostomy tube placement I discussed this over the phone with the patient's sister Cameron Proud [phone].  She is in favor of  placement if needed, given improvemnet over the last 24h after diet upgrade can hold off on scheduling, not even sure if anatomy would allow PEG,        Latest Ref Rng & Units 07/10/2023    6:58 AM 07/07/2023    6:35 AM 07/04/2023    5:37 AM  BMP  Glucose 70 - 99 mg/dL 147  829  562   BUN 6 - 20 mg/dL 18  15  17    Creatinine 0.61 - 1.24 mg/dL 1.30  8.65  7.84   Sodium 135 - 145 mmol/L 134  135  137   Potassium 3.5 - 5.1 mmol/L 4.2  4.5  4.2   Chloride 98 - 111 mmol/L 102  103  103   CO2 22 - 32 mmol/L 25  23  26    Calcium 8.9 - 10.3 mg/dL 8.9  8.8  8.8     Stable 7/15, d/c IVF  Recheck BMET in Friday   22.  Spasticity severe leg flexor tone, may need Botox / Xeomin prior to d/c LOS: 15 days A FACE TO FACE EVALUATION WAS PERFORMED  Erick Colace 07/12/2023, 10:15 AM

## 2023-07-12 NOTE — Progress Notes (Signed)
Physical Therapy Session Note  Patient Details  Name: Zachary Winters MRN: [id number] Date of Birth: [birth year]  Today's Date: 07/12/2023 PT Individual Time: 1100-1200 PT Individual Time Calculation (min): 60 min   Short Term Goals: Week 1:  PT Short Term Goal 1 (Week 1): Pt will perform bed mobility at overall ModA. PT Short Term Goal 1 - Progress (Week 1): Partly met PT Short Term Goal 2 (Week 1): Pt will perform sit<>stand with overall maxA +1 PT Short Term Goal 2 - Progress (Week 1): Met PT Short Term Goal 3 (Week 1): pt will maintain sitting balance to midline with modA +1 for at least . PT Short Term Goal 3 - Progress (Week 1): Met PT Short Term Goal 4 (Week 1): pt will perform squat pivot transfer with MaxA +1 PT Short Term Goal 4 - Progress (Week 1): Met PT Short Term Goal 5 (Week 1): Pt will complete PASS for outcome measure. PT Short Term Goal 5 - Progress (Week 1): Met Week 2:  PT Short Term Goal 1 (Week 2): Pt will perform bed mobility at overall ModA. PT Short Term Goal 2 (Week 2): Pt will perform sit<>stand with overall ModA +1 PT Short Term Goal 3 (Week 2): pt will maintain sitting balance to midline with CGA +1 for at least . PT Short Term Goal 4 (Week 2): pt will perform squat pivot transfer with MinA +1  Skilled Therapeutic Interventions/Progress Updates: Patient in [facility] handed off from OT session in room. Patient alert and agreeable to PT session.   Patient reported no pain at beginning of Pt session.  Therapeutic Activity: Transfers: Pt performed sit<>stands with minA (Pt R UE over PTA, and Pt use of L UE on handrail) during ambulation trials. Pt with modA to stand from hi/low mat with cues to use L UE to push off mat surface. Provided VC for anterior lean and to power through LE's throughout.  Gait Training:  Pt ambulated 35' x 2 with PTA on R side facilitating R LE advancement (maxA), and mod/maxA for standing balance/R knee block per buckling presentation  (closer to heavier mod with PTA allowing some slack for buckling to engage neuromuscular connection of R LE musculature. Pt with upright postural control with min cues to keep L UE parallel to lateral trunk vs flexed out. Pt performed 3rd trial of ambulation at end of session for roughly 25', but then required to sit as pt's brief started to come off (pt dependently transported to room to fix and for end of session). Back at room, put with totalA to fix brief + 2 with tech assisting in donning brief, and PTA providing maxA for pt to maintain standing balance.   Neuromuscular Re-ed: NMR facilitated during session with focus on proprioceptive feedback on R LE, motor coordination, and weight shifting. - Static standing balance from hi/low mat with PTA on R side providing heavy modA to R knee block. Pt cued to use mirror for visual feedback to correct standing posture. Pt cued to correct heavy R lean on PTA with max cues and maxA to maintain upright balance. Pt's R hip drop noted to be less significant this session than previous week. Pt then progressed to laterally weight shift to R side with PTA providing tactile stimulation to quads in order to promote neuromuscular feedback/activation (maxA - +2 with rehab tech on pt's L side for safety). Pt then progressed to perform anterior step with cues to kick over with L LE and follow it  up with retro step back to base (+2 with tech providing HHA on L UE). Pt required increased time and effort to perform kick with L LE and had trouble coordinating movement without significant assistance on L UE support, and to maintain R LE in stance phase (maxA). Pt became increasingly frustrated and verbalized the word "mad," but without the first first sound (PTA able to pick up what pt was saying with verbalization back to pt in order to confirm). PTA consoled pt and asked if pt wanted to perform another gait trial instead in order to boost pt morale. Pt agreed and was transported  back to hallway outside of main gym.   NMR performed for improvements in motor control and coordination, balance, sequencing, judgement, and self confidence/ efficacy in performing all aspects of mobility at highest level of independence.   Patient in [facility] at end of session with brakes locked, belt alarm set, and all needs within reach.      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 Restrictions Weight Bearing Restrictions: No  Therapy/Group: Individual Therapy  Itzael Liptak PTA 07/12/2023, 12:42 PM

## 2023-07-12 NOTE — Progress Notes (Addendum)
Occupational Therapy Session Note  Patient Details  Name: Zachary Winters MRN: [id number] Date of Birth: Oct 08, 1981  Today's Date: 07/12/2023 OT Individual Time: 4098-1191 session 1 OT Individual Time Calculation (min): 25 min  Session 2: 4782-9562   Short Term Goals: Week 2:  OT Short Term Goal 1 (Week 2): pt will complete squat pivot transfers with MOD A +2  as precursor to safe toilet transfers OT Short Term Goal 2 (Week 2): pt will complete sit>stands with MODA +2 to improve pts ability to sit>stand for LB dressing OT Short Term Goal 3 (Week 2): pt will sit EOB with CGA for > 5 mins as precursor to higher level ADLs from EOB  Skilled Therapeutic Interventions/Progress Updates:  Session 1: Pt greeted supine in bed, pt agreeable to OT intervention. Session focus on BADL reeducation, functional mobility, dynamic standing balance and decreasing overall caregiver burden.          Pt completed supine>sit with light MIN A to elevate trunk. Education provided on hemi technique for UB dressing however pt wanting to rush through task neglecting to attempt education. Overall, pt required MODA  to don OH shirt. Pt needed MAX A to don pants with pt able to thread LLE but needed assist with RLE, pt stood with MOD A from EOB while tech pulled pants to waist line.   Pt completed squat pivot to w/c to L side with MODA +2 for safety. Pt completed seated oral care using mouth wash with set- up assist.            Direct hand off to PTA for next session.              Session 2: pt greeted asleep in supine, easily able to arouse and agreeable to OT intervention. Noted brief to be wet, pt rolled R<>L with supervision, total A for brief change. Pt transitioned supine>sit with MIN A needed to advance RLE to EOB and elevate trunk. Pt completed squat pivot to w/c to L side with MODA +1. Total A transport to gym for time mgmt.   Applied K tape to R shoulder for improved joint stability, noted R scapula to be  winged. Worked on mobilization of scapula and strengthening at serratus anterior and rhomboids for improved range of motion and to improve posture. Additionally worked on active assist ROM in saebo arm mobilizer with pt needing  hand over hand assist to mobilize RUE in mobilizer d/t increased tone however provided mirror for visual feedback with pt noted to state "in/out" while working through elbow ROM.   Ended session with supine in bed with all needs within reach and bed alarm activated.   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 Restrictions Weight Bearing Restrictions: No  Pain: No indications of pain noted during either session    Therapy/Group: Individual Therapy  [facility] 07/12/2023, 12:17 PM

## 2023-07-13 LAB — GLUCOSE, CAPILLARY
Glucose-Capillary: 128 mg/dL — ABNORMAL HIGH (ref 70–99)
Glucose-Capillary: 134 mg/dL — ABNORMAL HIGH (ref 70–99)
Glucose-Capillary: 212 mg/dL — ABNORMAL HIGH (ref 70–99)
Glucose-Capillary: 264 mg/dL — ABNORMAL HIGH (ref 70–99)
Glucose-Capillary: 201 mg/dL — ABNORMAL HIGH (ref 70–99)

## 2023-07-13 NOTE — Progress Notes (Signed)
Occupational Therapy Session Note  Patient Details  Name: Zachary Winters MRN: [id number] Date of Birth: 1981-07-22  Today's Date: 07/13/2023 OT Individual Time: 2130-8657 OT Individual Time Calculation (min): 55 min    Short Term Goals: Week 2:  OT Short Term Goal 1 (Week 2): pt will complete squat pivot transfers with MOD A +2  as precursor to safe toilet transfers OT Short Term Goal 2 (Week 2): pt will complete sit>stands with MODA +2 to improve pts ability to sit>stand for LB dressing OT Short Term Goal 3 (Week 2): pt will sit EOB with CGA for > 5 mins as precursor to higher level ADLs from EOB  Skilled Therapeutic Interventions/Progress Updates:  Pt greeted seated in w/c attempting to manipulate cell phone, closed out of app for pt with pt nodding that was what he wanted. Pt transported to gym in w/c with total A for time mgmt.   1:1 NMES applied to R wrist extensors at the below settings:    Ratio 1:3 Rate 35 pps Waveform- Asymmetric Ramp 1.0 Pulse 300 Intensity- 36   Duration -   30 mins   Worked on functional grasp and release with compliant cube.   Pt initiating wanting to play checkers. Pt able to engage in seated therapeutic activity of checkers to facilitate improved verbal communication, problem solving, attention and improve overall mood/affect. Pt able to recall rules of game and even corrected this therapist on mistakes made, pt verbalizing "your turn" and "king me." Pt laughing and smiling during game.   Ended session with pt seated in w/c with alarm belt activated and all needs within reach.    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 Restrictions Weight Bearing Restrictions: No   Pain: no pain indicated.     Therapy/Group: Individual Therapy  [name] 07/13/2023, 12:31 PM

## 2023-07-13 NOTE — Progress Notes (Signed)
PROGRESS NOTE   Subjective/Complaints:  Self feeding, no cough, per SLP (at bedside) drinking liquids well ( upgraded to thin)   ROS: limited due to language/communication   Objective:   No results found. No results for input(s): "WBC", "HGB", "HCT", "PLT" in the last 72 hours.   No results for input(s): "NA", "K", "CL", "CO2", "GLUCOSE", "BUN", "CREATININE", "CALCIUM" in the last 72 hours.   Intake/Output Summary (Last 24 hours) at 07/13/2023 0821 Last data filed at 07/12/2023 1816 Gross per 24 hour  Intake 840 ml  Output 350 ml  Net 490 ml        Physical Exam: Vital Signs Blood pressure 134/81, pulse 98, temperature 98.2 F (36.8 C), resp. rate 19, height 6\' 1"  (1.854 m), weight 82.5 kg, SpO2 100%.   General: No acute distress Mood and affect are appropriate Heart: Regular rate and rhythm no rubs murmurs or extra sounds Lungs: Clear to auscultation, breathing unlabored, no rales or wheezes Abdomen: Positive bowel sounds, soft nontender to palpation, nondistended Extremities: No clubbing, cyanosis, or edema Skin: No evidence of breakdown, no evidence of rash  Neurologic: Aphasic but communicates with head nods, y/n.--still.  Cranial nerves II through XII intact, motor strength is 5/5 in left and 0/5 right  deltoid, bicep, tricep, grip, hip flexor, knee extensors, ankle dorsiflexor and plantar flexor RUE extension with yawn  Flexor withdrawal spasticity present RLE, MAS 3 in Left elbow flexors Sensory exam reduced pinch sensation on RIght   Musculoskeletal: Full range of motion in all 4 extremities. No joint swelling  Assessment/Plan: 1. Functional deficits which require 3+ hours per day of interdisciplinary therapy in a comprehensive inpatient rehab setting. Physiatrist is providing close team supervision and 24 hour management of active medical problems listed below. Physiatrist and rehab team continue to  assess barriers to discharge/monitor patient progress toward functional and medical goals  Care Tool:  Bathing        Body parts bathed by helper: Right arm, Left arm, Chest, Abdomen, Front perineal area, Buttocks, Right upper leg, Left upper leg, Face, Left lower leg, Right lower leg     Bathing assist Assist Level: Dependent - Patient 0%     Upper Body Dressing/Undressing Upper body dressing   What is the patient wearing?: Pull over shirt    Upper body assist Assist Level: Total Assistance - Patient < 25%    Lower Body Dressing/Undressing Lower body dressing      What is the patient wearing?: Underwear/pull up, Incontinence brief     Lower body assist Assist for lower body dressing: Dependent - Patient 0%     Toileting Toileting    Toileting assist Assist for toileting: Dependent - Patient 0%     Transfers Chair/bed transfer  Transfers assist  Chair/bed transfer activity did not occur: Safety/medical concerns  Chair/bed transfer assist level: Dependent - mechanical lift (STEDY)     Locomotion Ambulation   Ambulation assist      Assist level: Maximal Assistance - Patient 25 - 49% Assistive device: Other (comment) (L hallway handrail) Max distance: 35 ft   Walk 10 feet activity   Assist     Assist level: Maximal Assistance -  Patient 25 - 49% Assistive device: Other (comment) (L hallway hand rail)   Walk 50 feet activity   Assist Walk 50 feet with 2 turns activity did not occur: Safety/medical concerns         Walk 150 feet activity   Assist Walk 150 feet activity did not occur: Safety/medical concerns         Walk 10 feet on uneven surface  activity   Assist Walk 10 feet on uneven surfaces activity did not occur: Safety/medical concerns         Wheelchair     Assist Is the patient using a wheelchair?: Yes Type of Wheelchair: Manual    Wheelchair assist level: Dependent - Patient 0% Max wheelchair distance: 150 ft     Wheelchair 50 feet with 2 turns activity    Assist        Assist Level: Dependent - Patient 0%   Wheelchair 150 feet activity     Assist      Assist Level: Dependent - Patient 0%   Blood pressure 134/81, pulse 98, temperature 98.2 F (36.8 C), resp. rate 19, height 6\' 1"  (1.854 m), weight 82.5 kg, SpO2 100%.  Medical Problem List and Plan: 1. 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 Severe Left M1 and V4 stenosis              -patient may  shower             -ELOS/Goals: 7/30 min A for PT, OT and SLP- family training on Sat    -Continue CIR therapies including PT, OT, and SLP - 2.  Antithrombotics: -DVT/anticoagulation:  Pharmaceutical: Lovenox, LE doppler neg              -antiplatelet therapy: DAPT for at leasat  3 months. Has declined stenting.   3. Pain Management: Tylenol prn.  4. Mood/Behavior/Sleep: LCSW to follow   for evaluation and support when appropriate.              -antipsychotic agents: N/A 5. Neuropsych/cognition: This patient is not capable of making decisions on his own behalf. 6. Skin/Wound Care: Routine pressure relief measures.  7. Fluids/Electrolytes/Nutrition:   -intake a little more consistent yesterday  -continue IVF at [facility]  -labs pending for 7/15 8. 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 HTN: Monitor BP TID--long term goal normotensive             --continue amlodipine 10 mg/day and coreg  6.25 mg BID.  Vitals:   07/12/23 2125 07/13/23 0419  BP: 133/79 134/81  Pulse: 94 98  Resp: 19 19  Temp: 98.6 F (37 C) 98.2 F (36.8 C)  SpO2: 100% 100%   Keep in 130-150 range  10. T2DM w/peripheral neuropathy: Hgb A1c- 11.7. Was on Lantus 30 units PTA?  --Continue Insulin glargline-->was decreased to  5 units/HS today(to start tomorrow as received 10 units this am).   --Hold every 4  hours novolog as not on TF at this moment.  CBG (last 3)  Recent Labs    07/12/23 2057 07/13/23 0554 07/13/23 0608  GLUCAP 285* 134* 128*   Increased Semglee to 15U 7/7 Change SSI to moderate  7/14 increase semglee to 22 u d/t persistent elevation 7/18 good control except elevation at hs , will monitor  11. Acute on chronic renal failure?:  Improved with IVF for hydration-->was d/c  yesterday but will resume with questionable intake --.SCr 1.02 in 09/22 per chart review. SCr 1.57 @ admission-->1.29-->1.31  PO4 and Mg ok 7/7 Off IVF monitor UO and BMET  12. Severe dysphagia: Continue D1, honey liquids. Willing to take liquids-->will offer thickened Glucerna with meals for now.         13. Hyperlipidemia: LDL 265. On Lipitor 80 mg daily. 14. Severe vitamin D deficiency: On Ergocalciferol weekly with dialy low supplement.  15. ABLA: Hgb 13.1 @ admission-->11.1-->9.2. Will order stool guaiacs- none collected 7/5.                 Latest Ref Rng & Units 07/10/2023    6:58 AM 07/03/2023    9:23 AM 06/28/2023    5:52 AM  CBC  WBC 4.0 - 10.5 K/uL 7.9  9.9  10.2   Hemoglobin 13.0 - 17.0 g/dL 9.9  23.7  62.8   Hematocrit 39.0 - 52.0 % 29.9  32.4  30.4   Platelets 150 - 400 K/uL 338  361  295    Fluctuates 10-11 g/dL range 16. Low grade fevers: Encourage pulmonary hygiene.  17. Hx tobacco/Marijuana use: Continue  nicotine patch.  Encourage cessation of Marijuana.  18. Intermittent episodes of N/V: Question due to gastroparesis (Multiple ED visits in the past for intractable N/V). 19. Nasal congestion- ordered Flonase 1 sprays daily.  20.  Developing severe spasticity RLE>RUE   7/8-tizanidine just increased 7/7--obsv today 21.  Post stroke dysphagia,improved now on D3 thin liquids with good intake        Latest Ref Rng & Units 07/10/2023    6:58 AM 07/07/2023    6:35 AM 07/04/2023    5:37 AM  BMP  Glucose 70 - 99 mg/dL 315  176  160   BUN 6 - 20 mg/dL 18  15  17    Creatinine 0.61 - 1.24  mg/dL 7.37  1.06  2.69   Sodium 135 - 145 mmol/L 134  135  137   Potassium 3.5 - 5.1 mmol/L 4.2  4.5  4.2   Chloride 98 - 111 mmol/L 102  103  103   CO2 22 - 32 mmol/L 25  23  26    Calcium 8.9 - 10.3 mg/dL 8.9  8.8  8.8     Stable 7/15, d/c IVF  Recheck BMET in Friday   22.  Spasticity severe elbow flexor tone, may need Botox / Xeomin prior to d/c LOS: 16 days A FACE TO FACE EVALUATION WAS PERFORMED  Erick Colace 07/13/2023, 8:21 AM

## 2023-07-13 NOTE — Progress Notes (Signed)
Speech Language Pathology Daily Session Note  Patient Details  Name: Zachary Winters MRN: [id number] Date of Birth: Dec 16, 1981  Today's Date: 07/13/2023 SLP Individual Time: 0810-0900 SLP Individual Time Calculation (min): 50 min  Short Term Goals: Week 3: SLP Short Term Goal 1 (Week 3): Pt will tolerate Dys 3 diet and thin liquids with minimal s/s of aspiration and no indications of development of aspiration pneumonia SLP Short Term Goal 2 (Week 3): Pt will follow simple one step directions with supervision visual/verbal cues SLP Short Term Goal 3 (Week 3): Pt will complete functional naming tasks (automatic responses, responsive naming, simple confrontational naming, etc) with 80% accuracy given modA visual/verbal cues SLP Short Term Goal 4 (Week 3): Pt will utilize multimodal communication to express simple wants/needs with modA visual/verbal cues SLP Short Term Goal 5 (Week 3): Pt will consistently utilize safe swallow strategies during PO intake w/ supervision cues.  Skilled Therapeutic Interventions:   Pt greeted in bed, awake/alert upon SLP arrival. Pt utilized head nods/shake to answer initial questions re current status and wants/needs. He reported that he had not eaten breakfast, so SLP assisted w/ set up of his tray. He benefited from intermittent cues to clear R buccal pocketing before taking another bite, though managed oral clearance well overall with additional time and liquid washes. He also continued to tolerate thin liquids via cup vs provale, as he presented w/ no overt s/s of airway invasion and monitored his sip size INDly. At the end of morning meal, pt completed responsive naming task re household items/items in current environment w/ minA visual/verbal cues. Also verbalized automatic sequences (1-15 and dow) w/ spv cues. Approximations including middle/final phonemes continue. Pt left in bed: alarm set and call light within reach. Pt's nurse also present upon SLP departure.  Recommend cont ST per POC.   Pain  No pain reported. Appeared comfortable   Therapy/Group: Individual Therapy  [name] 07/13/2023, 8:25 AM

## 2023-07-13 NOTE — Progress Notes (Signed)
Physical Therapy Session Note  Patient Details  Name: Zachary Winters MRN: [id number] Date of Birth: Sep 23, 1981  Today's Date: 07/13/2023 PT Individual Time: 0347-4259; 1420 - 1502 PT Individual Time Calculation (min): 45 min 42 min  Short Term Goals: Week 2:  PT Short Term Goal 1 (Week 2): Pt will perform bed mobility at overall ModA. PT Short Term Goal 2 (Week 2): Pt will perform sit<>stand with overall ModA +1 PT Short Term Goal 3 (Week 2): pt will maintain sitting balance to midline with CGA +1 for at least . PT Short Term Goal 4 (Week 2): pt will perform squat pivot transfer with MinA +1  SESSION 1 Skilled Therapeutic Interventions/Progress Updates: Patient supine in bed with RN present on entrance to room. Patient alert and agreeable to PT session.   Patient reported "hurt" when asked about pain but could not indicate where. RN to provide tylenol. Pt brief soiled with residual BM.   Therapeutic Activity: Bed Mobility: Pt performed R sidelying from supine with CGA, and supine to L sidelying with mod/heavy modA for pericare, and to doff/donn brief. Pt then totalA to donn personal shorts through R LE and L initially, then was cued to donn the rest of the way with pt initiating bridge on L side to do so, but required max/totalA to finish on R around waist. Supine to sit pt min/modA with mod cues to use L UE to push into bed to assist with truncal elevation.  Transfers: Pt performed sit<>stand transfers throughout session with minA during gait trials, and squat pivot to L with modA. Provided VC for hand placement and sequence/performing anterior scoot (mod/maxA to scoot R hip).  Gait Training:  Pt ambulated 35' x 2 using LHR outside of main gym hallway with PTA on R side facilitating R LE advancement. Pt noted to have decreased wide BOS status over the past week per (pt used to tend to step with L LE in wider stance). Pt requires max/totalA to advance R LE through swing phase and stance  (maxA to prevent buckling). Pt cued to increase step length of L LE past R LE with PTA facilitating anterior hip shift in corresponding swing. Pt ambulated with mirror in front to visually assist in cues provided to keep trunk upright. Pt with minA to do so with help of LHR. Pt noted to be more verbal and engaged this session! Even stating things like "Another one" when asked what he wanted to do next (talking about gait - pt speaks with first sound of words missing, but can make out through context).  Patient in Zachary Winters at end of session with brakes locked, belt alarm set, and all needs within reach.  SESSION 2 Skilled Therapeutic Interventions/Progress Updates: Patient in Zachary Winters on entrance to room. Patient alert and agreeable to PT session.   Patient reported no pain this afternoon.  Therapeutic Activity: Bed Mobility: Pt performed sit to supine from EOB with modA. VC provided for patient to use L UE to brace on R HOB rail to assist in elevating  B LE's Transfers: Pt performed sit to stand transfers throughout session with minA (cues to anteriorly scoot - minA to scoot R hips) and modA to control eccentric with cues to use L UE to find either arm rest on WC or hi/low mat surface to assist in sit control.   Gait Training:  Pt ambulated 35' x 1 using LHR outside of main gym with PTA on R side facilitating R LE advancement (max/totalA) and  providing anterior hip shift during swing phase. Pt with maxA to prevent R knee buckling, and cues to step L LE past R LE. WC follow for safety. - Pt progressed to ambulating roughly 8' from [facility] to hi/low mat with PTA on R side facilitating R LE advancement, and rehab tech on L with HHA. Pt initially reluctant on performing task per fear of failure (pt consoled that it is not failing, it is setting down the framework to increase progression). Pt then performed ambulation trial with decreased step clearance on L, increased flexor tone in R UE and R LE, and with increased  assistance (heavy max) to prevent buckling on R. Pt cued to step to techs foot in order to turn (visual target) and then to reach back to sit in hi/low mat. Pt performed same sequence in hallway with tech on L, PTA on R and WC follow for safety. Pt presented with increased flexor tone R UE and R LE that had pt elevating R LE slightly when stepping with L LE. PTA removed pt R UE from around shoulders to decrease potential lift support to pt, and pt still presented with elevating R LE when stepping with L. PTA use of L foot to brace pt's heel of shoe while holding buckle to prevent flexor tone from lifting foot (noted to still have slight heel off, but not as bad). Pt ambulated for 30' and required rest break. Pt then transported back to room for end of session in WC.   Patient supine in bed at end of session with brakes locked, bed alarm set, and all needs within reach.       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 Restrictions Weight Bearing Restrictions: No  Therapy/Group: Individual Therapy  Zachary Winters PTA 07/13/2023, 4:09 PM

## 2023-07-13 NOTE — Progress Notes (Signed)
Orthopedic Tech Progress Note Patient Details:  Zachary Winters 24-Nov-1981 [id number] Called in order for resting who for the right side Patient ID: Zachary Winters, male   DOB: Feb 22, 1981, 42 y.o.   MRN: [id number]  [name] 07/13/2023, 10:03 AM

## 2023-07-14 DIAGNOSIS — I69359 Hemiplegia and hemiparesis following cerebral infarction affecting unspecified side: Secondary | ICD-10-CM

## 2023-07-14 LAB — GLUCOSE, CAPILLARY
Glucose-Capillary: 156 mg/dL — ABNORMAL HIGH (ref 70–99)
Glucose-Capillary: 150 mg/dL — ABNORMAL HIGH (ref 70–99)
Glucose-Capillary: 99 mg/dL (ref 70–99)
Glucose-Capillary: 186 mg/dL — ABNORMAL HIGH (ref 70–99)

## 2023-07-14 LAB — BASIC METABOLIC PANEL
Anion gap: 8 (ref 5–15)
BUN: 19 mg/dL (ref 6–20)
CO2: 26 mmol/L (ref 22–32)
Calcium: 8.8 mg/dL — ABNORMAL LOW (ref 8.9–10.3)
Chloride: 102 mmol/L (ref 98–111)
Creatinine, Ser: 1.51 mg/dL — ABNORMAL HIGH (ref 0.61–1.24)
GFR, Estimated: 60 mL/min — ABNORMAL LOW (ref >60)
Glucose, Bld: 155 mg/dL — ABNORMAL HIGH (ref 70–99)
Potassium: 4.1 mmol/L (ref 3.5–5.1)
Sodium: 136 mmol/L (ref 135–145)

## 2023-07-14 MED ORDER — INSULIN GLARGINE-YFGN 100 UNIT/ML ~~LOC~~ SOLN
25.0000 [IU] | Freq: Every day | SUBCUTANEOUS | Status: DC
  Administered 2023-07-14 – 2023-07-19 (×6): 25 [IU] via SUBCUTANEOUS
  Filled 2023-07-14 (×7): qty 0.25

## 2023-07-14 NOTE — Progress Notes (Signed)
Speech Language Pathology Daily Session Note  Patient Details  Name: Zachary Winters MRN: [id number] Date of Birth: August 04, 1981  Today's Date: 07/14/2023 SLP Individual Time: 0810-0915 SLP Individual Time Calculation (min): 65 min  Short Term Goals: Week 3: SLP Short Term Goal 1 (Week 3): Pt will tolerate Dys 3 diet and thin liquids with minimal s/s of aspiration and no indications of development of aspiration pneumonia SLP Short Term Goal 2 (Week 3): Pt will follow simple one step directions with supervision visual/verbal cues SLP Short Term Goal 3 (Week 3): Pt will complete functional naming tasks (automatic responses, responsive naming, simple confrontational naming, etc) with 80% accuracy given modA visual/verbal cues SLP Short Term Goal 4 (Week 3): Pt will utilize multimodal communication to express simple wants/needs with modA visual/verbal cues SLP Short Term Goal 5 (Week 3): Pt will consistently utilize safe swallow strategies during PO intake w/ supervision cues.  Skilled Therapeutic Interventions:   Pt greeted at bedside, asleep upon SLP arrival. He woke easily but reported that he did not sleep well last night. Assisted pt w set up of his morning meal. Cough x2 noted w/ Dys 3 textures (egg and potatoes); suspect buccal pocketing and talking during mastication negatively impacted success. Benefited from cue x3 throughout meal to clear oral cavity before taking another sip or bite. No overt s/s of airway invasion noted w/ thin liquids. After completion of morning meal, SLP facilitated tx tasks targeting verbal expression. Introduced simple object description Lexicographer (object use and appearance). He benefited from maxA cues overall given increased complexity of the task. He required only minA visual/verbal cues for word finding and/or to shape apraxic errors during sentence completion task. Pt noted to spontaneously state "Door -lose" upon SLP departure. Pt left  in bed with alarm set and clal light within reach. Recommend cont ST per POC.   Pain  None reported  Therapy/Group: Individual Therapy  [name] 07/14/2023, 7:48 AM

## 2023-07-14 NOTE — Progress Notes (Signed)
PROGRESS NOTE   Subjective/Complaints:  Oral intake improving no cough , working with SLP  ROS: limited due to language/communication   Objective:   No results found. No results for input(s): "WBC", "HGB", "HCT", "PLT" in the last 72 hours.   No results for input(s): "NA", "K", "CL", "CO2", "GLUCOSE", "BUN", "CREATININE", "CALCIUM" in the last 72 hours.   Intake/Output Summary (Last 24 hours) at 07/14/2023 0911 Last data filed at 07/13/2023 1811 Gross per 24 hour  Intake 827 ml  Output --  Net 827 ml        Physical Exam: Vital Signs Blood pressure (!) 138/92, pulse 95, temperature 98.2 F (36.8 C), resp. rate 19, height 6\' 1"  (1.854 m), weight 83 kg, SpO2 100%.   General: No acute distress Mood and affect are appropriate Heart: Regular rate and rhythm no rubs murmurs or extra sounds Lungs: Clear to auscultation, breathing unlabored, no rales or wheezes Abdomen: Positive bowel sounds, soft nontender to palpation, nondistended Extremities: No clubbing, cyanosis, or edema Skin: No evidence of breakdown, no evidence of rash  Neurologic: Aphasic but communicates with head nods, y/n.--still.  Cranial nerves II through XII intact, motor strength is 5/5 in left and 0/5 right  deltoid, bicep, tricep, grip, hip flexor, knee extensors, ankle dorsiflexor and plantar flexor RUE extension with yawn  Flexor withdrawal spasticity present RLE, MAS 3 in Left elbow flexors Sensory exam reduced pinch sensation on RIght   Musculoskeletal: Full range of motion in all 4 extremities. No joint swelling  Assessment/Plan: 1. Functional deficits which require 3+ hours per day of interdisciplinary therapy in a comprehensive inpatient rehab setting. Physiatrist is providing close team supervision and 24 hour management of active medical problems listed below. Physiatrist and rehab team continue to assess barriers to discharge/monitor  patient progress toward functional and medical goals  Care Tool:  Bathing        Body parts bathed by helper: Right arm, Left arm, Chest, Abdomen, Front perineal area, Buttocks, Right upper leg, Left upper leg, Face, Left lower leg, Right lower leg     Bathing assist Assist Level: Dependent - Patient 0%     Upper Body Dressing/Undressing Upper body dressing   What is the patient wearing?: Pull over shirt    Upper body assist Assist Level: Total Assistance - Patient < 25%    Lower Body Dressing/Undressing Lower body dressing      What is the patient wearing?: Underwear/pull up, Incontinence brief     Lower body assist Assist for lower body dressing: Dependent - Patient 0%     Toileting Toileting    Toileting assist Assist for toileting: Dependent - Patient 0%     Transfers Chair/bed transfer  Transfers assist  Chair/bed transfer activity did not occur: Safety/medical concerns  Chair/bed transfer assist level: Dependent - mechanical lift (STEDY)     Locomotion Ambulation   Ambulation assist      Assist level: Maximal Assistance - Patient 25 - 49% Assistive device: Other (comment) (L hallway handrail) Max distance: 35 ft   Walk 10 feet activity   Assist     Assist level: Maximal Assistance - Patient 25 - 49% Assistive device: Other (  comment) (L hallway hand rail)   Walk 50 feet activity   Assist Walk 50 feet with 2 turns activity did not occur: Safety/medical concerns         Walk 150 feet activity   Assist Walk 150 feet activity did not occur: Safety/medical concerns         Walk 10 feet on uneven surface  activity   Assist Walk 10 feet on uneven surfaces activity did not occur: Safety/medical concerns         Wheelchair     Assist Is the patient using a wheelchair?: Yes Type of Wheelchair: Manual    Wheelchair assist level: Dependent - Patient 0% Max wheelchair distance: 150 ft    Wheelchair 50 feet with 2 turns  activity    Assist        Assist Level: Dependent - Patient 0%   Wheelchair 150 feet activity     Assist      Assist Level: Dependent - Patient 0%   Blood pressure (!) 138/92, pulse 95, temperature 98.2 F (36.8 C), resp. rate 19, height 6\' 1"  (1.854 m), weight 83 kg, SpO2 100%.  Medical Problem List and Plan: 1. 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 Severe Left M1 and V4 stenosis              -patient may  shower             -ELOS/Goals: 7/30 min A for PT, OT and SLP- family training on Sat    -Continue CIR therapies including PT, OT, and SLP - 2.  Antithrombotics: -DVT/anticoagulation:  Pharmaceutical: Lovenox, LE doppler neg              -antiplatelet therapy: DAPT for at leasat  3 months. Has declined stenting.   3. Pain Management: Tylenol prn.  4. Mood/Behavior/Sleep: LCSW to follow   for evaluation and support when appropriate.              -antipsychotic agents: N/A 5. Neuropsych/cognition: This patient is not capable of making decisions on his own behalf. 6. Skin/Wound Care: Routine pressure relief measures.  7. Fluids/Electrolytes/Nutrition:   -intake a little more consistent yesterday  -continue IVF at [facility]  -labs pending for 7/15 8. 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 HTN: Monitor BP TID--long term goal normotensive             --continue amlodipine 10 mg/day and coreg  6.25 mg BID.  Vitals:   07/13/23 2027 07/14/23 0732  BP: 121/73 (!) 138/92  Pulse: (!) 101 95  Resp: 18 19  Temp: 98.9 F (37.2 C) 98.2 F (36.8 C)  SpO2: 100% 100%   Keep in 130-150 range  10. T2DM w/peripheral neuropathy: Hgb A1c- 11.7. Was on Lantus 30 units PTA?  --Continue Insulin glargline-->was decreased to  5 units/HS today(to start tomorrow as received 10 units this am).   --Hold every 4 hours novolog as not on  TF at this moment.  CBG (last 3)  Recent Labs    07/13/23 1700 07/13/23 2107 07/14/23 0726  GLUCAP 264* 201* 156*   Increased Semglee to 15U 7/7 Change SSI to moderate  7/14 increase semglee to 22 u d/t persistent elevation 7/19 increase semglee to 25 U, consider low dose glipizide in am  11. Acute on chronic renal failure?:  Improved with IVF for hydration-->was d/c yesterday but  will resume with questionable intake --.SCr 1.02 in 09/22 per chart review. SCr 1.57 @ admission-->1.29-->1.31  PO4 and Mg ok 7/7 Off IVF monitor UO and BMET  12. Severe dysphagia: Continue D1, honey liquids. Willing to take liquids-->will offer thickened Glucerna with meals for now.         13. Hyperlipidemia: LDL 265. On Lipitor 80 mg daily. 14. Severe vitamin D deficiency: On Ergocalciferol weekly with dialy low supplement.  15. ABLA: Hgb 13.1 @ admission-->11.1-->9.2. Will order stool guaiacs- none collected 7/5.                 Latest Ref Rng & Units 07/10/2023    6:58 AM 07/03/2023    9:23 AM 06/28/2023    5:52 AM  CBC  WBC 4.0 - 10.5 K/uL 7.9  9.9  10.2   Hemoglobin 13.0 - 17.0 g/dL 9.9  16.1  09.6   Hematocrit 39.0 - 52.0 % 29.9  32.4  30.4   Platelets 150 - 400 K/uL 338  361  295    Fluctuates 10-11 g/dL range 16. Low grade fevers: Encourage pulmonary hygiene.  17. Hx tobacco/Marijuana use: Continue  nicotine patch.  Encourage cessation of Marijuana.  18. Intermittent episodes of N/V: Question due to gastroparesis (Multiple ED visits in the past for intractable N/V). 19. Nasal congestion- ordered Flonase 1 sprays daily.  20.  Developing severe spasticity RLE>RUE   7/8-tizanidine just increased 7/7--obsv today 21.  Post stroke dysphagia,improved now on D3 thin liquids with good intake        Latest Ref Rng & Units 07/10/2023    6:58 AM 07/07/2023    6:35 AM 07/04/2023    5:37 AM  BMP  Glucose 70 - 99 mg/dL 045  409  811   BUN 6 - 20 mg/dL 18  15  17    Creatinine 0.61 - 1.24 mg/dL 9.14  7.82   9.56   Sodium 135 - 145 mmol/L 134  135  137   Potassium 3.5 - 5.1 mmol/L 4.2  4.5  4.2   Chloride 98 - 111 mmol/L 102  103  103   CO2 22 - 32 mmol/L 25  23  26    Calcium 8.9 - 10.3 mg/dL 8.9  8.8  8.8     Stable 7/15, d/c IVF  Recheck BMET in Friday - awaiting labs  22.  Spasticity severe elbow flexor tone, may need Botox / Xeomin prior to d/c, cont ROM  LOS: 17 days A FACE TO FACE EVALUATION WAS PERFORMED  Erick Colace 07/14/2023, 9:11 AM

## 2023-07-14 NOTE — Plan of Care (Signed)
  Problem: Consults Goal: RH STROKE PATIENT EDUCATION Description: See Patient Education module for education specifics  Outcome: Progressing   Problem: RH BOWEL ELIMINATION Goal: RH STG MANAGE BOWEL WITH ASSISTANCE Description: STG Manage Bowel with mod I Assistance. Outcome: Progressing Goal: RH STG MANAGE BOWEL W/MEDICATION W/ASSISTANCE Description: STG Manage Bowel with Medication with mod I  Assistance. Outcome: Progressing   Problem: RH BLADDER ELIMINATION Goal: RH STG MANAGE BLADDER WITH ASSISTANCE Description: STG Manage Bladder With toileting Assistance Outcome: Progressing   Problem: RH SAFETY Goal: RH STG ADHERE TO SAFETY PRECAUTIONS W/ASSISTANCE/DEVICE Description: STG Adhere to Safety Precautions With cues Assistance/Device. Outcome: Progressing   Problem: RH KNOWLEDGE DEFICIT Goal: RH STG INCREASE KNOWLEDGE OF DIABETES Description: Patient and family will be able to manage DM with medications and dietary modifications using educational resources independently Outcome: Progressing Goal: RH STG INCREASE KNOWLEDGE OF HYPERTENSION Description: Patient and family will be able to manage HTN with medications and dietary modifications using educational resources independently Outcome: Progressing Goal: RH STG INCREASE KNOWLEDGE OF DYSPHAGIA/FLUID INTAKE Description: Patient and family will be able to manage Dysphagia, medications and dietary modifications using educational resources independently Outcome: Progressing Goal: RH STG INCREASE KNOWLEGDE OF HYPERLIPIDEMIA Description: Patient and family will be able to manage HLD with medications and dietary modifications using educational resources independently Outcome: Progressing Goal: RH STG INCREASE KNOWLEDGE OF STROKE PROPHYLAXIS Description: Patient and family will be able to manage secondary risks with medications and dietary modifications using educational resources independently Outcome: Progressing

## 2023-07-14 NOTE — Progress Notes (Signed)
Occupational Therapy Session Note  Patient Details  Name: Zachary Winters MRN: [id number] Date of Birth: March 25, 1981  Today's Date: 07/14/2023 OT Individual Time: 1430-1530 OT Individual Time Calculation (min): 60 min    Short Term Goals: Week 2:  OT Short Term Goal 1 (Week 2): pt will complete squat pivot transfers with MOD A +2  as precursor to safe toilet transfers OT Short Term Goal 2 (Week 2): pt will complete sit>stands with MODA +2 to improve pts ability to sit>stand for LB dressing OT Short Term Goal 3 (Week 2): pt will sit EOB with CGA for > 5 mins as precursor to higher level ADLs from EOB  Skilled Therapeutic Interventions/Progress Updates:  Pt greeted supine in bed, pt initially shaking his head and stating "no" that he didn't want to participate in therapy but with encouragement pt agreeable. Pt completed supine>sit with MIN A needing assist to maneuver RLE to EOB and elevate trunk into sitting. Squat pivot to w/c to L side with MODA +1. Transferred pt outside to provide change in scenery and increase pt buy in. While outside utilized estim on RUE as indicated below:   1:1 NMES applied to R wrist flexors at the below settings:    Ratio 1:3 Rate 35 pps Waveform- Asymmetric Ramp 1.0 Pulse 300 Intensity- 21  Duration -   15 mins  Worked on functional grasp with an emphasis on bilateral integration with pt also completing same ROM on L hand.   Pt conversating even more today, stating that his hobby was "writing" , pt  laughing during session and seeming to be happy to be outside.   Transported pt back inside and noted R resting hand splint to be delivered, placed sign over bed to improve compliance with orthotic. Fitted splint to pts R hand to allow for proper fit.   Ended session with pt supine in bed with all needs within reach and bed alarm activated.  Therapy Documentation Precautions:  Precautions Precautions: Fall Precaution Comments: dense R hemi with flexor tone,  expressive>receptive aphasia Restrictions Weight Bearing Restrictions: No  Pain: Pt did grimace at first to estim stimulus, therefore lowered intensity for pain mgmt.    Therapy/Group: Individual Therapy  Pollyann Glen [place] 07/14/2023, 4:00 PM

## 2023-07-14 NOTE — Progress Notes (Signed)
Physical Therapy Session Note  Patient Details  Name: Zachary Winters MRN: [id number] Date of Birth: [birth year]  Today's Date: 07/14/2023 PT Individual Time: 1050-1202 PT Individual Time Calculation (min): 72 min   Short Term Goals: Week 1:  PT Short Term Goal 1 (Week 1): Pt will perform bed mobility at overall ModA. PT Short Term Goal 1 - Progress (Week 1): Partly met PT Short Term Goal 2 (Week 1): Pt will perform sit<>stand with overall maxA +1 PT Short Term Goal 2 - Progress (Week 1): Met PT Short Term Goal 3 (Week 1): pt will maintain sitting balance to midline with modA +1 for at least . PT Short Term Goal 3 - Progress (Week 1): Met PT Short Term Goal 4 (Week 1): pt will perform squat pivot transfer with MaxA +1 PT Short Term Goal 4 - Progress (Week 1): Met PT Short Term Goal 5 (Week 1): Pt will complete PASS for outcome measure. PT Short Term Goal 5 - Progress (Week 1): Met Week 2:  PT Short Term Goal 1 (Week 2): Pt will perform bed mobility at overall ModA. PT Short Term Goal 2 (Week 2): Pt will perform sit<>stand with overall ModA +1 PT Short Term Goal 3 (Week 2): pt will maintain sitting balance to midline with CGA +1 for at least . PT Short Term Goal 4 (Week 2): pt will perform squat pivot transfer with MinA +1  Skilled Therapeutic Interventions/Progress Updates: Patient supine in bed on entrance to room. Patient alert and agreeable to PT session.   Patient reported initially not wanting to participate in PT today and said they wanted to go home (pt presented with more gloom today than previous day). PTA encouraged patient to participate, and asked if pt wanted to go outside to boost pt morale, and if we could do some walking afterwards. Pt agreed to do so. Pt dependently transported to 1st floor exit to go outside.   Therapeutic Activity: Bed Mobility: Pt with soiled brief at beginning. Pt performed supine to R sidelying with minA, and supine to L sidelying with heavy  modA (cues to use bed rail to pull self over). PTA doffed and donned brief during pericare (noted BM and urine void). Pt donned personal shorts up to waist (starting from mid leg), and required maxA to finish donning around waist once in standing. Transfers: Pt performed sit<>stand transfers throughout session with minA. Provided VC for anterior scoot (modA on L, CGA on L). Pt performed stand pivot from EOB to WC with maxA and cues to lean to R to advance L LE towards chair (PTA blocking R knee).   Gait Training:  Pt ambulated 3' x 2 using LHR outside of main gym and with PTA on R side facilitating R LE advancement (WC follow + 2 for safety). Pt cued to increase step length on L LE with PTA facilitating anterior hip advancement. Pt required maxA to maintain upright balance, and max/totalA to advance R LE. Pt   Wheelchair Mobility:  Pt propelled wheelchair around outside on unlevel surfaces with maxA with hemi-technique. PTA in front with tactile stimulation for pt to understand sequence of LE, and rehab tech propelling WC. Pt had trouble initiating required sequence but was able to catch on after increased time (further WC mobility moved to inside later in session).  Pt propelled WC in main room gym with PTA in front on stoool with yellow theraband pulling pt into knee extension (L LE) with cues for pt to pull band  into flexion to provide tacile stimulation of movement. Pt demos increased understanding of sequence and had great carryover (no UE involvement at that time).  Patient in [facility] at end of session with brakes locked, belt alarm set, and all needs within reach.      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 Restrictions Weight Bearing Restrictions: No  Therapy/Group: Individual Therapy  Danelly Hassinger PTA 07/14/2023, 3:58 PM

## 2023-07-15 DIAGNOSIS — E119 Type 2 diabetes mellitus without complications: Secondary | ICD-10-CM

## 2023-07-15 DIAGNOSIS — Z794 Long term (current) use of insulin: Secondary | ICD-10-CM

## 2023-07-15 DIAGNOSIS — R252 Cramp and spasm: Secondary | ICD-10-CM

## 2023-07-15 DIAGNOSIS — I69320 Aphasia following cerebral infarction: Secondary | ICD-10-CM

## 2023-07-15 DIAGNOSIS — I69351 Hemiplegia and hemiparesis following cerebral infarction affecting right dominant side: Principal | ICD-10-CM

## 2023-07-15 LAB — GLUCOSE, CAPILLARY
Glucose-Capillary: 123 mg/dL — ABNORMAL HIGH (ref 70–99)
Glucose-Capillary: 202 mg/dL — ABNORMAL HIGH (ref 70–99)
Glucose-Capillary: 141 mg/dL — ABNORMAL HIGH (ref 70–99)

## 2023-07-15 MED ORDER — BACLOFEN 5 MG HALF TABLET: 5.0000 mg | ORAL_TABLET | Freq: Every evening | ORAL | Status: DC | PRN

## 2023-07-15 NOTE — Progress Notes (Signed)
Physical Therapy Session Note  Patient Details  Name: Zachary Winters MRN: [id number] Date of Birth: January 21, 1981  Today's Date: 07/15/2023 PT Individual Time: 4098-1191 PT Individual Time Calculation (min): 47 min   Short Term Goals: Week 1:  PT Short Term Goal 1 (Week 1): Pt will perform bed mobility at overall ModA. PT Short Term Goal 1 - Progress (Week 1): Partly met PT Short Term Goal 2 (Week 1): Pt will perform sit<>stand with overall maxA +1 PT Short Term Goal 2 - Progress (Week 1): Met PT Short Term Goal 3 (Week 1): pt will maintain sitting balance to midline with modA +1 for at least . PT Short Term Goal 3 - Progress (Week 1): Met PT Short Term Goal 4 (Week 1): pt will perform squat pivot transfer with MaxA +1 PT Short Term Goal 4 - Progress (Week 1): Met PT Short Term Goal 5 (Week 1): Pt will complete PASS for outcome measure. PT Short Term Goal 5 - Progress (Week 1): Met Week 2:  PT Short Term Goal 1 (Week 2): Pt will perform bed mobility at overall ModA. PT Short Term Goal 2 (Week 2): Pt will perform sit<>stand with overall ModA +1 PT Short Term Goal 3 (Week 2): pt will maintain sitting balance to midline with CGA +1 for at least . PT Short Term Goal 4 (Week 2): pt will perform squat pivot transfer with MinA +1  Skilled Therapeutic Interventions/Progress Updates: Patient supine in bed with HOB elevated and family present on entrance to room. Patient alert and agreeable to PT session.   Patient reported no pain at beginning of PT session. Today's session focused on family education and updated progress of pt's functional mobility. Family further iterated that pt will be staying with sister, Zachary Winters, that has a ramp already installed. The previous owner had it WC accessible, but sister was still given home measurement sheet to ensure dimensions are okay for WC accessibility. Family also stated that pt will likely go home in grandmother's,Zachary Winters, Zachary Winters that has automatic  sliding doors (PTA asked for them to take pictures and measurements for transfer safety). Pt family also noted to look into talking to LCSW for potential hospital bed upon pt's d/c. Family also educated that pt's current assistance status may not be where pt will be at closer to d/c, but that some days might be harder than others and it is a good idea to get a feel of where pt is at now.  Therapeutic Activity: Pt performed supine (HOB elevated) to sit EOB with min/light modA (VC for pt to use L UE to push off bed to assist in truncal elevation). Pt then modA to scoot to EOB and with pt recalling first step, which is scooting (assistance to facilitate R hip anterior scoot while pt scoots on L). Pt then performed squat pivot to WC (to the L) with questioning cues and pt with noted recall of extending L UE towards far WC arm. Pt then cued to lean into PTA's opposing shoulder to keep with head/hip relationship, and for pt to initiate movement by counting to 3. Pt with modA to squat pivot to L. Pt then squat pivoted to the R with min/modA, and cues to push from arm rest with L UE. Pt with maxA to adjust R LE accordingly. PTA providing verbal instructions throughout pivots to sister and family, and had sister perform manual assistance to pt. PTA acted as + 1 behind pt from EOB to [facility], and instructed sister to  recall sequence (mod/max cues required to recall). Sister demos ability to assist pt in transfer with PTA only providing CGA (sister reported only feeling like doing half of the work, and was educated on ensuring to have pt perform as much as possible once home).   Patient in [facility] at end of session with brakes locked, belt alarm set, family present and all needs within reach.      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 Restrictions Weight Bearing Restrictions: No  Therapy/Group: Individual  Therapy  Uriah Trueba PTA 07/15/2023, 3:39 PM

## 2023-07-15 NOTE — Progress Notes (Signed)
Physical Therapy Weekly Progress Note  Patient Details  Name: Zachary Winters MRN: [id number] Date of Birth: 1981/11/29  Beginning of progress report period: June 28, 2023 End of progress report period: July 15, 2023  Patient has met 2 of 4 short term goals.  Pt has met 2 functional mobility STG's by demonstrated minA with sit to stands with use of L UE for support/push, and has increased static sitting balance while sitting edge of mat with CGA/supervision for safety without leaning to R as initial evaluation demonstrated (pt use of L UE for support on surface). Pt is making progress towards bed mobility by demonstrating modA with most, but still requires heavy modA to roll to non paretic side (L - will continue to progress pt throughout future sessions). Pt is also making progress with squat pivots, but still requires modA to perform when transferring to the L, but min/light mod to the R, and demos required cues to recall head/hip relationship, and UE/LE placement (pt is progressing in this area with recalling as pt is becoming more verbal). Pt is functionally limited by R hemiparesis (requires max/totalA to advance R LE during gait, but heavy mod/maxA to prevent buckling), expressive aphasia (pt has increased vocal expression - pt does not say first sound in a word but can somewhat be understood through context). Pt has increased participation in PT services since last PN, and still has days where increased encouragement is necessary to motivate pt (going outside for some PT has helped). Family education has been initiated with family understanding pt's current functional mobility level as of late.   Patient continues to demonstrate the following deficits muscle weakness, abnormal tone, unbalanced muscle activation, decreased coordination, and decreased motor planning, decreased attention to right and decreased motor planning, decreased initiation, decreased attention, decreased awareness, decreased  problem solving, and decreased safety awareness, and decreased sitting balance, decreased standing balance, decreased postural control, and decreased balance strategies and therefore will continue to benefit from skilled PT intervention to increase functional independence with mobility.  Patient {LTG progression:[id number]}.  {plan of MVHQ:4696295}  PT Short Term Goals Week 2:  PT Short Term Goal 1 (Week 2): Pt will perform bed mobility at overall ModA. PT Short Term Goal 1 - Progress (Week 2): Partly met PT Short Term Goal 2 (Week 2): Pt will perform sit<>stand with overall ModA +1 PT Short Term Goal 2 - Progress (Week 2): Met PT Short Term Goal 3 (Week 2): pt will maintain sitting balance to midline with CGA +1 for at least . PT Short Term Goal 3 - Progress (Week 2): Met PT Short Term Goal 4 (Week 2): pt will perform squat pivot transfer with MinA +1 PT Short Term Goal 4 - Progress (Week 2): Partly met  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 Restrictions Weight Bearing Restrictions: No  Faatima Tench PTA 07/15/2023, 3:42 PM

## 2023-07-15 NOTE — Progress Notes (Signed)
Occupational Therapy Session Note  Patient Details  Name: Zachary Winters MRN: [id number] Date of Birth: Feb 11, 1981  Today's Date: 07/15/2023 OT Individual Time: 1450-1530 OT Individual Time Calculation (min): 40 min    Short Term Goals: Week 2:  OT Short Term Goal 1 (Week 2): pt will complete squat pivot transfers with MOD A +2  as precursor to safe toilet transfers OT Short Term Goal 2 (Week 2): pt will complete sit>stands with MODA +2 to improve pts ability to sit>stand for LB dressing OT Short Term Goal 3 (Week 2): pt will sit EOB with CGA for > 5 mins as precursor to higher level ADLs from EOB  Skilled Therapeutic Interventions/Progress Updates:  Skilled OT intervention completed with focus on family education with pt's sister, aunt and grandmother present regarding functional transfer and ADL recommendations. Pt received seated in w/c, agreeable to session. No pain reported.  OT provided education and demo on the following during session: -Reviewed safety challenges with postural deficits during ADLs like bathing and toileting with suggestion for pt to bathe bed level or at EOB with assistance vs shower/BSC level at this time -Advised that toileting at bed level would be most appropriate at his CLOF due to postural and dense hemi deficits along with reported incontinence -Pt's sister reported that they have ramped entrance to 1 level home, with plan for pt to have hospital bed in a room of his own and states that she will primarily be assisting him -Sister agreeable to demo recall of assisting pt with squat pivot transfer back to EOB. Sister needed cues on w/c positioning/brakes, handling techniques, her body mechanics and safety considerations however was able to assist pt with mod A squat pivot to EOB with OT assisting pt from behind for extra safety; sister would benefit from continued hands on practice despite reporting having experience as a NT -OT demonstrated assisting pt with max  A scooting back into bed with Rt knee block however poor hip clearance despite max cues -Sister assisted with head/trunk, then OT cued pt to hook LLE under RLE in prep for hooklying > side lying > supine with overall mod A needed; sister would benefit from practice doing as a +1 assist in future -Reviewed bed level toileting for incontinence prior to OOB mobility however OT time constraint limited ability to practice physically though pt indicated his brief was dry  Pt remained upright in bed, with bed alarm on/activated, RUE on pillow for hemi positioning and with all needs in reach at end of session.   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 Restrictions Weight Bearing Restrictions: No    Therapy/Group: Individual Therapy  Melvyn Novas, MS, OTR/L  07/15/2023, 3:58 PM

## 2023-07-15 NOTE — Progress Notes (Signed)
Speech Language Pathology Daily Session Note  Patient Details  Name: Zachary Winters MRN: [id number] Date of Birth: 02-Oct-1981  Today's Date: 07/15/2023 SLP Individual Time: 1300-1350 SLP Individual Time Calculation (min): 50 min  Short Term Goals: Week 3: SLP Short Term Goal 1 (Week 3): Pt will tolerate Dys 3 diet and thin liquids with minimal s/s of aspiration and no indications of development of aspiration pneumonia SLP Short Term Goal 2 (Week 3): Pt will follow simple one step directions with supervision visual/verbal cues SLP Short Term Goal 3 (Week 3): Pt will complete functional naming tasks (automatic responses, responsive naming, simple confrontational naming, etc) with 80% accuracy given modA visual/verbal cues SLP Short Term Goal 4 (Week 3): Pt will utilize multimodal communication to express simple wants/needs with modA visual/verbal cues SLP Short Term Goal 5 (Week 3): Pt will consistently utilize safe swallow strategies during PO intake w/ supervision cues.  Skilled Therapeutic Interventions:  Patient and family were seen for family education this date.  Patient was joined by his sister, his aunt, and his grandmother.  Patient was asleep for majority of session; however, did wake towards the end of education.  SLP provided education on the following topics re: aphasia, apraxia, and dysphagia.    SLP explained the difference in aphasia and apraxia and provided handouts for both diagnoses. SLP discussed communication strategies re: providing choices, asking yes or no questions , providing extra time for response, asking patient if it is okay to fill in his words/thought.  SLP deferred to primary SLP for specific strategies/cues that worked best for this patient (I.e. sentence completion, orthographic etc).  SLP sympathized with family that this is a lot of information at one time and encouraged family to attend speech therapy sessions for ongoing education regarding the best ways to help  [facility] [place].     Family demonstrated good understanding of current diet.  SLP recommends further education on safe swallow strategies prior to discharge if warranted.   At this time, family had no other questions.  They reported they would reach out to SLP with follow-up.   Patient was left with therapy for next session.   Pain Pain Assessment Pain Score: 0-No pain  Therapy/Group: Individual Therapy  Dorena Bodo 07/15/2023, 2:18 PM

## 2023-07-15 NOTE — Plan of Care (Signed)
  Problem: Consults Goal: RH STROKE PATIENT EDUCATION Description: See Patient Education module for education specifics  Outcome: Progressing   Problem: RH BOWEL ELIMINATION Goal: RH STG MANAGE BOWEL WITH ASSISTANCE Description: STG Manage Bowel with mod I Assistance. Outcome: Progressing Goal: RH STG MANAGE BOWEL W/MEDICATION W/ASSISTANCE Description: STG Manage Bowel with Medication with mod I  Assistance. Outcome: Progressing   Problem: RH BLADDER ELIMINATION Goal: RH STG MANAGE BLADDER WITH ASSISTANCE Description: STG Manage Bladder With toileting Assistance Outcome: Progressing   Problem: RH SAFETY Goal: RH STG ADHERE TO SAFETY PRECAUTIONS W/ASSISTANCE/DEVICE Description: STG Adhere to Safety Precautions With cues Assistance/Device. Outcome: Progressing   Problem: RH KNOWLEDGE DEFICIT Goal: RH STG INCREASE KNOWLEDGE OF DIABETES Description: Patient and family will be able to manage DM with medications and dietary modifications using educational resources independently Outcome: Progressing Goal: RH STG INCREASE KNOWLEDGE OF HYPERTENSION Description: Patient and family will be able to manage HTN with medications and dietary modifications using educational resources independently Outcome: Progressing Goal: RH STG INCREASE KNOWLEDGE OF DYSPHAGIA/FLUID INTAKE Description: Patient and family will be able to manage Dysphagia, medications and dietary modifications using educational resources independently Outcome: Progressing Goal: RH STG INCREASE KNOWLEGDE OF HYPERLIPIDEMIA Description: Patient and family will be able to manage HLD with medications and dietary modifications using educational resources independently Outcome: Progressing Goal: RH STG INCREASE KNOWLEDGE OF STROKE PROPHYLAXIS Description: Patient and family will be able to manage secondary risks with medications and dietary modifications using educational resources independently Outcome: Progressing

## 2023-07-15 NOTE — Progress Notes (Signed)
PROGRESS NOTE   Subjective/Complaints:  No events overnight. Vitals stable Last BM 7/19; large - incontinent b/b  ROS: Denies fevers, chills, N/V, abdominal pain, constipation, diarrhea, SOB, cough, chest pain, new weakness or paraesthesias.    Objective:   No results found. No results for input(s): "WBC", "HGB", "HCT", "PLT" in the last 72 hours.   Recent Labs    07/14/23 0559  NA 136  K 4.1  CL 102  CO2 26  GLUCOSE 155*  BUN 19  CREATININE 1.51*  CALCIUM 8.8*     Intake/Output Summary (Last 24 hours) at 07/15/2023 0955 Last data filed at 07/15/2023 0815 Gross per 24 hour  Intake 600 ml  Output --  Net 600 ml        Physical Exam: Vital Signs Blood pressure (!) 134/94, pulse 94, temperature 98.5 F (36.9 C), temperature source Oral, resp. rate 16, height 6\' 1"  (1.854 m), weight 83.4 kg, SpO2 100%.   General: No acute distress. Sitting up in bed with family at bedside.  Mood and affect are appropriate Heart: Regular rate and rhythm no rubs murmurs or extra sounds Lungs: Clear to auscultation, breathing unlabored, no rales or wheezes Abdomen: Positive bowel sounds, soft nontender to palpation, nondistended Extremities: No clubbing, cyanosis, or edema Skin: No evidence of breakdown, no evidence of rash  Neurologic:  + Aphasic + dysarthric, can appropriately respond to  y/n questions verbally and give single word answers for orientation questions  Cranial nerves II through XII intact, motor strength is 5/5 in left and 0/5 right  deltoid, bicep, tricep, grip, hip flexor, knee extensors, ankle dorsiflexor and plantar flexor RUE extension with yawn  Flexor withdrawal spasticity present RLE, MAS 3 R elbow, knee, and hip Sensory exam endporsing equal sensaiton bilaterally Musculoskeletal: Full range of motion in all 4 extremities. No joint swelling  Assessment/Plan: 1. Functional deficits which require 3+  hours per day of interdisciplinary therapy in a comprehensive inpatient rehab setting. Physiatrist is providing close team supervision and 24 hour management of active medical problems listed below. Physiatrist and rehab team continue to assess barriers to discharge/monitor patient progress toward functional and medical goals  Care Tool:  Bathing        Body parts bathed by helper: Right arm, Left arm, Chest, Abdomen, Front perineal area, Buttocks, Right upper leg, Left upper leg, Face, Left lower leg, Right lower leg     Bathing assist Assist Level: Dependent - Patient 0%     Upper Body Dressing/Undressing Upper body dressing   What is the patient wearing?: Pull over shirt    Upper body assist Assist Level: Total Assistance - Patient < 25%    Lower Body Dressing/Undressing Lower body dressing      What is the patient wearing?: Underwear/pull up, Incontinence brief     Lower body assist Assist for lower body dressing: Dependent - Patient 0%     Toileting Toileting    Toileting assist Assist for toileting: Dependent - Patient 0%     Transfers Chair/bed transfer  Transfers assist  Chair/bed transfer activity did not occur: Safety/medical concerns  Chair/bed transfer assist level: Dependent - mechanical lift (STEDY)  Locomotion Ambulation   Ambulation assist      Assist level: Maximal Assistance - Patient 25 - 49% Assistive device: Other (comment) (L hallway handrail) Max distance: 35 ft   Walk 10 feet activity   Assist     Assist level: Maximal Assistance - Patient 25 - 49% Assistive device: Other (comment) (L hallway hand rail)   Walk 50 feet activity   Assist Walk 50 feet with 2 turns activity did not occur: Safety/medical concerns         Walk 150 feet activity   Assist Walk 150 feet activity did not occur: Safety/medical concerns         Walk 10 feet on uneven surface  activity   Assist Walk 10 feet on uneven surfaces  activity did not occur: Safety/medical concerns         Wheelchair     Assist Is the patient using a wheelchair?: Yes Type of Wheelchair: Manual    Wheelchair assist level: Dependent - Patient 0% Max wheelchair distance: 150 ft    Wheelchair 50 feet with 2 turns activity    Assist        Assist Level: Dependent - Patient 0%   Wheelchair 150 feet activity     Assist      Assist Level: Dependent - Patient 0%   Blood pressure (!) 134/94, pulse 94, temperature 98.5 F (36.9 C), temperature source Oral, resp. rate 16, height 6\' 1"  (1.854 m), weight 83.4 kg, SpO2 100%.  Medical Problem List and Plan: 1. 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 Severe Left M1 and V4 stenosis              -patient may  shower             -ELOS/Goals: 7/30 min A for PT, OT and SLP- family training on Sat    -Continue CIR therapies including PT, OT, and SLP - improving  2.  Antithrombotics: -DVT/anticoagulation:  Pharmaceutical: Lovenox, LE doppler neg              -antiplatelet therapy: DAPT for at leasat  3 months. Has declined stenting.   3. Pain Management: Tylenol prn.  4. Mood/Behavior/Sleep: LCSW to follow   for evaluation and support when appropriate.              -antipsychotic agents: N/A 5. Neuropsych/cognition: This patient is not capable of making decisions on his own behalf. 6. Skin/Wound Care: Routine pressure relief measures.   7. Fluids/Electrolytes/Nutrition:   -intake a little more consistent yesterday  -continue IVF at [facility]  -labs pending for 7/15  8. 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 HTN: Monitor BP TID--long term goal normotensive             --continue amlodipine 10 mg/day and coreg  6.25 mg BID.  Vitals:   07/14/23 2011 07/15/23 0459  BP: (!) 134/93 (!) 134/94  Pulse: 95 94  Resp: 18 16   Temp: 98.9 F (37.2 C) 98.5 F (36.9 C)  SpO2: 99% 100%   Keep in 130-150 range - in range  10. T2DM w/peripheral neuropathy: Hgb A1c- 11.7. Was on Lantus 30 units PTA?  --Continue Insulin glargline-->was decreased to  5 units/HS today(to start tomorrow as received 10 units this am).   --Hold every 4 hours novolog as not on TF at this moment.  CBG (last 3)  Recent Labs    07/14/23 1203 07/14/23 1721 07/14/23 2114  GLUCAP 150* 99 186*   Increased Semglee to 15U 7/7 Change SSI to moderate  7/14 increase semglee to 22 u d/t persistent elevation 7/19 increase semglee to 25 U, consider low dose glipizide in am  7/20: well controlled  11. Acute on chronic renal failure?:  Improved with IVF for hydration-->was d/c yesterday but will resume with questionable intake --.SCr 1.02 in 09/22 per chart review. SCr 1.57 @ admission-->1.29-->1.31 PO4 and Mg ok 7/7 Off IVF monitor UO and BMET   12. Severe dysphagia: Continue D1, honey liquids. Willing to take liquids-->will offer thickened Glucerna with meals for now.         13. Hyperlipidemia: LDL 265. On Lipitor 80 mg daily. 14. Severe vitamin D deficiency: On Ergocalciferol weekly with dialy low supplement.  15. ABLA: Hgb 13.1 @ admission-->11.1-->9.2. Will order stool guaiacs- none collected 7/5.                 Latest Ref Rng & Units 07/10/2023    6:58 AM 07/03/2023    9:23 AM 06/28/2023    5:52 AM  CBC  WBC 4.0 - 10.5 K/uL 7.9  9.9  10.2   Hemoglobin 13.0 - 17.0 g/dL 9.9  62.9  52.8   Hematocrit 39.0 - 52.0 % 29.9  32.4  30.4   Platelets 150 - 400 K/uL 338  361  295    Fluctuates 10-11 g/dL range  16. Low grade fevers: Encourage pulmonary hygiene.  17. Hx tobacco/Marijuana use: Continue  nicotine patch.  Encourage cessation of Marijuana.  18. Intermittent episodes of N/V: Question due to gastroparesis (Multiple ED visits in the past for intractable N/V). 19. Nasal congestion- ordered Flonase 1 sprays daily.  20.  Developing  severe spasticity RLE>RUE   7/8-tizanidine just increased 7/7--obsv today  21.  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  Spasticity severe elbow flexor tone, may need Botox / Xeomin prior to d/c, cont ROM    - Reminder to nursing to don WHO at nighhttime 7/20; R ankle ROM still WNL LOS: 18 days A FACE TO FACE EVALUATION WAS PERFORMED  Angelina Sheriff 07/15/2023, 9:55 AM

## 2023-07-16 LAB — GLUCOSE, CAPILLARY
Glucose-Capillary: 123 mg/dL — ABNORMAL HIGH (ref 70–99)
Glucose-Capillary: 160 mg/dL — ABNORMAL HIGH (ref 70–99)
Glucose-Capillary: 142 mg/dL — ABNORMAL HIGH (ref 70–99)
Glucose-Capillary: 235 mg/dL — ABNORMAL HIGH (ref 70–99)

## 2023-07-16 MED ORDER — BACLOFEN 10 MG PO TABS
10.0000 mg | ORAL_TABLET | Freq: Three times a day (TID) | ORAL | Status: DC
  Administered 2023-07-16 – 2023-07-18 (×7): 10 mg via ORAL
  Filled 2023-07-16 (×8): qty 1

## 2023-07-16 NOTE — Progress Notes (Signed)
Physical Therapy Session Note  Patient Details  Name: Zachary Winters MRN: [id number] Date of Birth: [birth year]  Today's Date: 07/16/2023 PT Individual Time: 6578-4696 PT Individual Time Calculation (min): 44 min   Short Term Goals: Week 2:  PT Short Term Goal 1 (Week 2): Pt will perform bed mobility at overall ModA. PT Short Term Goal 1 - Progress (Week 2): Partly met PT Short Term Goal 2 (Week 2): Pt will perform sit<>stand with overall ModA +1 PT Short Term Goal 2 - Progress (Week 2): Met PT Short Term Goal 3 (Week 2): pt will maintain sitting balance to midline with CGA +1 for at least . PT Short Term Goal 3 - Progress (Week 2): Met PT Short Term Goal 4 (Week 2): pt will perform squat pivot transfer with MinA +1 PT Short Term Goal 4 - Progress (Week 2): Partly met  Skilled Therapeutic Interventions/Progress Updates:      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 Restrictions Weight Bearing Restrictions: No  Pt received semi-reclined in bed, agreeable to PT session. Pt A & O x 1 and frequently requires re-direction as he reports he wants to go "home". Pt largely limited in session due to R UE/LE flexor tone. PT performed gentle R LE ROM for tone management in session. Pt requires min A with supine to sit and for scooting to edge of bed. Pt participated in blocked practice of sit to stand transfers initially requiring max A which faded to mod Ax 1. PT provided manual facilitation of R LE to increase weight bearing to decrease flexor tone. Pt able to progress from L UE support to no UE support while performing dynamic contralateral and ipsilateral reaching tasks (mod A). PT requested skilled + 2 and attempted gait training with 3 musketeer technique. Pt able to shift weight to left as PT attempted to advance R LE and pt started to hop on L LE. Pt unable to coordinate gait despite  max manual facilitation and deferred further gait training. Pt returned to supine with min A and left semi-reclined in bed with all needs in reach and alarm on.     Therapy/Group: Individual Therapy  Truitt Leep Truitt Leep PT, DPT  07/16/2023, 4:14 PM

## 2023-07-16 NOTE — Progress Notes (Addendum)
PROGRESS NOTE   Subjective/Complaints:  No events overnight. Working with PT; they note severe plantarflexion tone in RLE causing springing-forward on ambulation with R stance. Can passively range to neutral.  Patient appears cognitively unchanged, no complaints, can follow simple commands and questions, oriented only to self.  Vitals stable Last BM 7/19; large - incontinent b/b  ROS:  Difficult to determine d/t aphasia, cognition  Objective:   No results found. No results for input(s): "WBC", "HGB", "HCT", "PLT" in the last 72 hours.   Recent Labs    07/14/23 0559  NA 136  K 4.1  CL 102  CO2 26  GLUCOSE 155*  BUN 19  CREATININE 1.51*  CALCIUM 8.8*     Intake/Output Summary (Last 24 hours) at 07/16/2023 2123 Last data filed at 07/16/2023 1752 Gross per 24 hour  Intake 535 ml  Output --  Net 535 ml        Physical Exam: Vital Signs Blood pressure 133/88, pulse 91, temperature 98.7 F (37.1 C), temperature source Oral, resp. rate 18, height 6\' 1"  (1.854 m), weight 81.4 kg, SpO2 100%.   General: No acute distress. Laying in bed with PT at bedside.  Mood and affect are appropriate Heart: Regular rate and rhythm no rubs murmurs or extra sounds Lungs: Clear to auscultation, breathing unlabored, no rales or wheezes Abdomen: Positive bowel sounds, soft nontender to palpation, nondistended Extremities: No clubbing, cyanosis, or edema Skin: No evidence of breakdown, no evidence of rash  Neurologic:  + Aphasic + dysarthric, can appropriately respond to  y/n questions verbally and give single word answers for orientation questions Oriented to self only, with cues  Cranial nerves II through XII intact, motor strength is 5/5 in left and 0/5 right  deltoid, bicep, tricep, grip, hip flexor, knee extensors, ankle dorsiflexor and plantar flexor MAS 3 R elbow, knee, and hip Sensory exam endorsing slightly reduced  sensation diffusely on the right Musculoskeletal: Full range of motion in all 4 extremities. No joint swelling  Assessment/Plan: 1. Functional deficits which require 3+ hours per day of interdisciplinary therapy in a comprehensive inpatient rehab setting. Physiatrist is providing close team supervision and 24 hour management of active medical problems listed below. Physiatrist and rehab team continue to assess barriers to discharge/monitor patient progress toward functional and medical goals  Care Tool:  Bathing        Body parts bathed by helper: Right arm, Left arm, Chest, Abdomen, Front perineal area, Buttocks, Right upper leg, Left upper leg, Face, Left lower leg, Right lower leg     Bathing assist Assist Level: Dependent - Patient 0%     Upper Body Dressing/Undressing Upper body dressing   What is the patient wearing?: Pull over shirt    Upper body assist Assist Level: Total Assistance - Patient < 25%    Lower Body Dressing/Undressing Lower body dressing      What is the patient wearing?: Underwear/pull up, Incontinence brief     Lower body assist Assist for lower body dressing: Dependent - Patient 0%     Toileting Toileting    Toileting assist Assist for toileting: Dependent - Patient 0%     Transfers Chair/bed  transfer  Transfers assist  Chair/bed transfer activity did not occur: Safety/medical concerns  Chair/bed transfer assist level: Dependent - mechanical lift (STEDY)     Locomotion Ambulation   Ambulation assist      Assist level: Maximal Assistance - Patient 25 - 49% Assistive device: Other (comment) (L hallway handrail) Max distance: 35 ft   Walk 10 feet activity   Assist     Assist level: Maximal Assistance - Patient 25 - 49% Assistive device: Other (comment) (L hallway hand rail)   Walk 50 feet activity   Assist Walk 50 feet with 2 turns activity did not occur: Safety/medical concerns         Walk 150 feet  activity   Assist Walk 150 feet activity did not occur: Safety/medical concerns         Walk 10 feet on uneven surface  activity   Assist Walk 10 feet on uneven surfaces activity did not occur: Safety/medical concerns         Wheelchair     Assist Is the patient using a wheelchair?: Yes Type of Wheelchair: Manual    Wheelchair assist level: Dependent - Patient 0% Max wheelchair distance: 150 ft    Wheelchair 50 feet with 2 turns activity    Assist        Assist Level: Dependent - Patient 0%   Wheelchair 150 feet activity     Assist      Assist Level: Dependent - Patient 0%   Blood pressure 133/88, pulse 91, temperature 98.7 F (37.1 C), temperature source Oral, resp. rate 18, height 6\' 1"  (1.854 m), weight 81.4 kg, SpO2 100%.  Medical Problem List and Plan: 1. 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 Severe Left M1 and V4 stenosis              -patient may  shower             -ELOS/Goals: 7/30 min A for PT, OT and SLP- family training on Sat    -Continue CIR therapies including PT, OT, and SLP - improving  2.  Antithrombotics: -DVT/anticoagulation:  Pharmaceutical: Lovenox, LE doppler neg              -antiplatelet therapy: DAPT for at leasat  3 months. Has declined stenting.   3. Pain Management: Tylenol prn.  4. Mood/Behavior/Sleep: LCSW to follow   for evaluation and support when appropriate.              -antipsychotic agents: N/A 5. Neuropsych/cognition: This patient is not capable of making decisions on his own behalf. 6. Skin/Wound Care: Routine pressure relief measures.   7. Fluids/Electrolytes/Nutrition:   -intake a little more consistent yesterday  -continue IVF at [facility]  -labs pending for 7/15 - stable 7/19  8. 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 HTN: Monitor BP TID--long  term goal normotensive             --continue amlodipine 10 mg/day and coreg  6.25 mg BID.  Vitals:   07/16/23 1304 07/16/23 2037  BP: 125/88 133/88  Pulse: 81 91  Resp: 16 18  Temp: 98.6 F (37 C) 98.7 F (37.1 C)  SpO2: 100% 100%   Keep in 130-150 range - in range; appropriate 7/20-21  10. T2DM w/peripheral neuropathy: Hgb A1c- 11.7. Was on Lantus 30 units PTA?  --Continue Insulin glargline-->was decreased to  5 units/HS today(to start tomorrow as received 10 units this am).   --Hold every 4 hours novolog as not on TF at this moment.  CBG (last 3)  Recent Labs    07/16/23 0617 07/16/23 1133 07/16/23 1637  GLUCAP 123* 160* 142*   Increased Semglee to 15U 7/7 Change SSI to moderate  7/14 increase semglee to 22 u d/t persistent elevation 7/19 increase semglee to 25 U, consider low dose glipizide in am  7/20-21: well controlled  11. Acute on chronic renal failure?:  Improved with IVF for hydration-->was d/c yesterday but will resume with questionable intake --.SCr 1.02 in 09/22 per chart review. SCr 1.57 @ admission-->1.29-->1.31 PO4 and Mg ok 7/7 Off IVF monitor UO and BMET   12. Severe dysphagia: Continue D1, honey liquids. Willing to take liquids-->will offer thickened Glucerna with meals for now.         13. Hyperlipidemia: LDL 265. On Lipitor 80 mg daily. 14. Severe vitamin D deficiency: On Ergocalciferol weekly with dialy low supplement.  15. ABLA: Hgb 13.1 @ admission-->11.1-->9.2. Will order stool guaiacs- none collected 7/5.                 Latest Ref Rng & Units 07/10/2023    6:58 AM 07/03/2023    9:23 AM 06/28/2023    5:52 AM  CBC  WBC 4.0 - 10.5 K/uL 7.9  9.9  10.2   Hemoglobin 13.0 - 17.0 g/dL 9.9  11.9  14.7   Hematocrit 39.0 - 52.0 % 29.9  32.4  30.4   Platelets 150 - 400 K/uL 338  361  295    Fluctuates 10-11 g/dL range  16. Low grade fevers: Encourage pulmonary hygiene.  17. Hx tobacco/Marijuana use: Continue  nicotine patch.  Encourage cessation of  Marijuana.  18. Intermittent episodes of N/V: Question due to gastroparesis (Multiple ED visits in the past for intractable N/V). 19. Nasal congestion- ordered Flonase 1 sprays daily.  20.  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  Post stroke dysphagia,improved now on D3 thin liquids with good intake        Latest Ref Rng & Units 07/14/2023    5:59 AM 07/10/2023    6:58 AM 07/07/2023    6:35 AM  BMP  Glucose 70 - 99 mg/dL 829  562  130   BUN 6 - 20 mg/dL 19  18  15    Creatinine 0.61 - 1.24 mg/dL 8.65  7.84  6.96   Sodium 135 - 145 mmol/L 136  134  135   Potassium 3.5 - 5.1 mmol/L 4.1  4.2  4.5   Chloride 98 - 111 mmol/L 102  102  103   CO2 22 - 32 mmol/L 26  25  23    Calcium 8.9 - 10.3 mg/dL 8.8  8.9  8.8       22.  Spasticity severe elbow flexor tone, may need Botox / Xeomin prior to d/c, cont ROM    - Reminder to nursing to don WHO at nighhttime 7/20; R ankle ROM still WNL, no PRAFO in room, if not available would consider ordering this week before PF tone goes past neutral   - 7/21: D/w PT, start scheduled Baclofen 10 mg TID in addition to current Zanaflex.  LOS: 19 days A FACE TO FACE EVALUATION WAS PERFORMED  Angelina Sheriff 07/16/2023, 9:23 PM

## 2023-07-16 NOTE — Plan of Care (Signed)
Pt progressing with goals

## 2023-07-16 NOTE — Progress Notes (Signed)
Speech Language Pathology Daily Session Note  Patient Details  Name: Zachary Winters MRN: [id number] Date of Birth: [birth year]  Today's Date: 07/16/2023 SLP Individual Time: 1002-1100 SLP Individual Time Calculation (min): 58 min  Short Term Goals: Week 3: SLP Short Term Goal 1 (Week 3): Pt will tolerate Dys 3 diet and thin liquids with minimal s/s of aspiration and no indications of development of aspiration pneumonia SLP Short Term Goal 2 (Week 3): Pt will follow simple one step directions with supervision visual/verbal cues SLP Short Term Goal 3 (Week 3): Pt will complete functional naming tasks (automatic responses, responsive naming, simple confrontational naming, etc) with 80% accuracy given modA visual/verbal cues SLP Short Term Goal 4 (Week 3): Pt will utilize multimodal communication to express simple wants/needs with modA visual/verbal cues SLP Short Term Goal 5 (Week 3): Pt will consistently utilize safe swallow strategies during PO intake w/ supervision cues.  Skilled Therapeutic Interventions:   Pt seen for skilled ST targeting aphasia and apraxia goals. Reported no pain. 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 Pt often omitted first or last phoneme but when provided simultaneous production with SLP and max cueing, was able to approximate correct phoneme in 40% of opportunities. Pt followed simple one step directions with 80% acc when provided repetition of instructions and occasional model. SLP trialed low-tech AAC alphabet board to increase communication, which was successful in 50% of opportunities as pt often pointed to incorrect letters. Pt benefitted from field of 2 choices, repetition, and written cues throughout session.  Pt left in bed with call bell in reach and bed alarm activated.   Pain Pain Assessment Pain Scale: 0-10 Pain Score:  0-No pain Faces Pain Scale: No hurt  Therapy/Group: Individual Therapy  Alphonsus Sias 07/16/2023, 12:22 PM

## 2023-07-16 NOTE — Progress Notes (Signed)
Speech Language Pathology Daily Session Note  Patient Details  Name: Zachary Winters MRN: [id number] Date of Birth: 15-May-1981  Today's Date: 07/16/2023 SLP Individual Time: 0100-0145 SLP Individual Time Calculation (min): 45 min  Short Term Goals: Week 3: SLP Short Term Goal 1 (Week 3): Pt will tolerate Dys 3 diet and thin liquids with minimal s/s of aspiration and no indications of development of aspiration pneumonia SLP Short Term Goal 2 (Week 3): Pt will follow simple one step directions with supervision visual/verbal cues SLP Short Term Goal 3 (Week 3): Pt will complete functional naming tasks (automatic responses, responsive naming, simple confrontational naming, etc) with 80% accuracy given modA visual/verbal cues SLP Short Term Goal 4 (Week 3): Pt will utilize multimodal communication to express simple wants/needs with modA visual/verbal cues SLP Short Term Goal 5 (Week 3): Pt will consistently utilize safe swallow strategies during PO intake w/ supervision cues.  Skilled Therapeutic Interventions:   Pt seen for skilled ST targeting aphasia and apraxia of speech. Reported no pain. SLP facilitated confrontational naming tasks and pt had 80% acc when accounting for articulatory errors due to apraxia. Pt approximated object names when provided simultaneous production with SLP and max articulatory cueing. Pt often producing /n/ or /l/ when attempting to produce /t/ in his first name. Unable to produce or approximate /k/ or /g/. Pt able to express basic wants/needs when provided max A from SLP including y/n questions and field of 2 options. Pt used alphabet board with 0% acc for providing clarification during communication breakdowns. This strategy does not appear to be helpful at this time as the pt often says different letters than he points to. Pt occasionally labile during session and stating that he was sad and occasionally angry. Pt frequently asking when he could go home; was able to orient  himself to the dc date when provided min visual cues. SLP planned to trial Dys 3 snack; however, pt declined PO this session.   Pt left in bed with call bell in reach and bed alarm activated. Continue ST POC.   Pain Pain Assessment Pain Scale: 0-10 Pain Score: 0-No pain Faces Pain Scale: No hurt  Therapy/Group: Individual Therapy  Alphonsus Sias 07/16/2023, 3:46 PM

## 2023-07-17 LAB — GLUCOSE, CAPILLARY
Glucose-Capillary: 129 mg/dL — ABNORMAL HIGH (ref 70–99)
Glucose-Capillary: 165 mg/dL — ABNORMAL HIGH (ref 70–99)
Glucose-Capillary: 147 mg/dL — ABNORMAL HIGH (ref 70–99)
Glucose-Capillary: 273 mg/dL — ABNORMAL HIGH (ref 70–99)

## 2023-07-17 LAB — CBC
HCT: 29.8 % — ABNORMAL LOW (ref 39.0–52.0)
Hemoglobin: 10 g/dL — ABNORMAL LOW (ref 13.0–17.0)
MCH: 30.3 pg (ref 26.0–34.0)
MCHC: 33.6 g/dL (ref 30.0–36.0)
MCV: 90.3 fL (ref 80.0–100.0)
Platelets: 278 10*3/uL (ref 150–400)
RBC: 3.3 MIL/uL — ABNORMAL LOW (ref 4.22–5.81)
RDW: 13 % (ref 11.5–15.5)
WBC: 6.7 10*3/uL (ref 4.0–10.5)
nRBC: 0 % (ref 0.0–0.2)

## 2023-07-17 NOTE — Progress Notes (Signed)
Occupational Therapy Weekly Progress Note  Patient Details  Name: Zachary Winters MRN: [id number] Date of Birth: 08/11/1981  Beginning of progress report period: July 05, 2023 End of progress report period: July 17, 2023  Today's Date: 07/17/2023 OT Individual Time: 1045-1200 OT Individual Time Calculation (min): 75 min    Patient has met 3 of 3 short term goals.  Pt has been participating well in all of his sessions and has made great progress with his sitting balance and postural control.  This allows him to sit safely in a regular w/c, on the [facility] and a tub bench.  He continues to struggle with high tone in RUE and RLE which make transitional movements more challenging.    Patient continues to demonstrate the following deficits: abnormal tone and decreased coordination, decreased attention to right, delayed processing, and decreased sitting balance, decreased standing balance, decreased postural control, hemiplegia, and decreased balance strategies and therefore will continue to benefit from skilled OT intervention to enhance overall performance with BADL.  Patient progressing toward long term goals..  Plan of care revisions: .Marland Kitchen  Problem: RH Balance Goal: LTG Patient will maintain dynamic standing with ADLs (OT) Description: LTG:  Patient will maintain dynamic standing balance with assist during activities of daily living (OT)  Flowsheets (Taken 07/17/2023 1304) LTG: Pt will maintain dynamic standing balance during ADLs with: (LTG modified: Pt will hold STATIC stand with mod A or less to enable caregivers to A with clothing management.) Moderate Assistance - Patient 50 - 74% Note: LTG modified: Pt will hold STATIC stand with mod A or less to enable caregivers to A with clothing management.    Problem: Sit to Stand Goal: LTG:  Patient will perform sit to stand in prep for activites of daily living with assistance level (OT) Description: LTG:  Patient will perform sit to stand in prep for  activites of daily living with assistance level (OT) Flowsheets (Taken 07/17/2023 1304) LTG: PT will perform sit to stand in prep for activites of daily living with assistance level: (LTG downgraded due to high RLE tone causes movements to be more challenging for patient.) Minimal Assistance - Patient > 75% Note: LTG downgraded due to high RLE tone causes movements to be more challenging for patient.    Problem: RH Toileting Goal: LTG Patient will perform toileting task (3/3 steps) with assistance level (OT) Description: LTG: Patient will perform toileting task (3/3 steps) with assistance level (OT)  Flowsheets (Taken 07/17/2023 1304) LTG: Pt will perform toileting task (3/3 steps) with assistance level: Maximal Assistance - Patient 25 - 49%  OT Short Term Goals Week 1:  OT Short Term Goal 1 (Week 1): pt will be able to hold static sit balance with CGA to enable him to sit safely on a BSC. OT Short Term Goal 1 - Progress (Week 1): Met OT Short Term Goal 2 (Week 1): Pt will be able to sit to stand in a stedy lift with mod A to enable a safe transfer to a BSC. OT Short Term Goal 2 - Progress (Week 1): Met OT Short Term Goal 3 (Week 1): Pt will demonstrate improved R side attention by completing RUE self ROM to prevent contractures due to hypertone, OT Short Term Goal 3 - Progress (Week 1): Progressing toward goal OT Short Term Goal 4 (Week 1): Pt will sit to stand with max A to enable care givers to pull clothing up over hips. OT Short Term Goal 4 - Progress (Week 1): Met Week  2:  OT Short Term Goal 1 (Week 2): pt will complete squat pivot transfers with MOD A +2  as precursor to safe toilet transfers OT Short Term Goal 1 - Progress (Week 2): Met OT Short Term Goal 2 (Week 2): pt will complete sit>stands with MODA +2 to improve pts ability to sit>stand for LB dressing OT Short Term Goal 2 - Progress (Week 2): Met OT Short Term Goal 3 (Week 2): pt will sit EOB with CGA for > 5 mins as precursor to  higher level ADLs from EOB OT Short Term Goal 3 - Progress (Week 2): Met Week 3:  OT Short Term Goal 1 (Week 3): STGs= LTGs  Skilled Therapeutic Interventions/Progress Updates:    Pt received in wc dressed and ready for therapy. Will plan to do a shower tomorrow.  Pt taken to gym to focus on w/c to mat transfers moving R to mat and L back to wc 3x.  Focus on forward lean and body alignment along with gaze in opposite directions.  His transfers to L are closer to min A but to the R mod A.   On mat worked on [employer] bearing, body on arm ROM and gentle ROM with table top slides for tone reduction and stretching of arm.  Worked on dynamic sit balance with shifting shoulders L and returning to center which he accomplished well. Practiced to the R with CGA and guiding to slide R arm out.  Pt leans with head tilted vs upright. Worked on Arts development officer for feedback. He was able to achieve but did feel a bit dizzy.    Worked on squat pivot to R to wc and back to mat to L 3x with same A min to L , mod to R.  Continues to need some cues to keep hips lifted.    Tried 1 sit to stand from mat to hemiwalker -pt able to push up to stand with mod A but once up total A to hold balance as R LE tone increased.  Tried 1 x using parallel bar.  This stable fixture helped pt to stand more easily with less A. R leg continued to pull into tone.  Will need to keep working on this so family can manage his clothing for toileting tasks.  Pt resting in w/c with all needs met. Alarm set and call light in reach.    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 Restrictions Weight Bearing Restrictions: No    Pain: Pain Assessment Pain Score: 0-No pain ADL: ADL Eating: Supervision/safety Grooming: Minimal cueing, Minimal assistance Where Assessed-Grooming: Sitting at sink Upper Body Bathing:  Minimal assistance Where Assessed-Upper Body Bathing: Shower Lower Body Bathing: Maximal assistance Where Assessed-Lower Body Bathing: Shower Upper Body Dressing: Moderate assistance Where Assessed-Upper Body Dressing: Wheelchair Lower Body Dressing: Dependent Where Assessed-Lower Body Dressing: Wheelchair Toileting: Not assessed Toilet Transfer: Maximal assistance Toilet Transfer Method: Engineer, water: Maximal assistance, Maximal cueing Film/video editor Method: Administrator: Emergency planning/management officer, Grab bars   Therapy/Group: Individual Therapy  Xin Klawitter 07/17/2023, 1:13 PM

## 2023-07-17 NOTE — Plan of Care (Signed)
Problem: RH Balance Goal: LTG Patient will maintain dynamic sitting balance (PT) Description: LTG:  Patient will maintain dynamic sitting balance with assistance during mobility activities (PT) 07/17/2023 2251 by Loel Dubonnet, PT Reactivated 07/17/2023 2244 by Loel Dubonnet, PT Outcome: Not Applicable Goal: LTG Patient will maintain dynamic standing balance (PT) Description: LTG:  Patient will maintain dynamic standing balance with assistance during mobility activities (PT) 07/17/2023 2252 by Loel Dubonnet, PT Flowsheets (Taken 07/17/2023 2252) LTG: Pt will maintain dynamic standing balance during mobility activities with:: Moderate Assistance - Patient 50 - 74% 07/17/2023 2251 by Loel Dubonnet, PT Reactivated 07/17/2023 2244 by Loel Dubonnet, PT Outcome: Not Applicable Flowsheets (Taken 07/17/2023 2244) LTG: Pt will maintain dynamic standing balance during mobility activities with:: Moderate Assistance - Patient 50 - 74%   Problem: Sit to Stand Goal: LTG:  Patient will perform sit to stand with assistance level (PT) Description: LTG:  Patient will perform sit to stand with assistance level (PT) 07/17/2023 2251 by Loel Dubonnet, PT Reactivated 07/17/2023 2244 by Loel Dubonnet, PT Outcome: Not Applicable Flowsheets (Taken 07/17/2023 2244) LTG: PT will perform sit to stand in preparation for functional mobility with assistance level: Moderate Assistance - Patient 50 - 74%   Problem: RH Bed Mobility Goal: LTG Patient will perform bed mobility with assist (PT) Description: LTG: Patient will perform bed mobility with assistance, with/without cues (PT). 07/17/2023 2251 by Loel Dubonnet, PT Reactivated 07/17/2023 2244 by Loel Dubonnet, PT Outcome: Not Applicable   Problem: RH Bed to Chair Transfers Goal: LTG Patient will perform bed/chair transfers w/assist (PT) Description: LTG: Patient will perform bed to chair transfers with assistance (PT). 07/17/2023 2251 by Loel Dubonnet,  PT Reactivated 07/17/2023 2244 by Loel Dubonnet, PT Outcome: Not Applicable   Problem: RH Car Transfers Goal: LTG Patient will perform car transfers with assist (PT) Description: LTG: Patient will perform car transfers with assistance (PT). 07/17/2023 2252 by Loel Dubonnet, PT Flowsheets (Taken 07/17/2023 2252) LTG: Pt will perform car transfers with assist:: Moderate Assistance - Patient 50 - 74% 07/17/2023 2251 by Loel Dubonnet, PT Reactivated 07/17/2023 2244 by Loel Dubonnet, PT Outcome: Not Applicable Flowsheets (Taken 07/17/2023 2244) LTG: Pt will perform car transfers with assist:: Moderate Assistance - Patient 50 - 74%   Problem: RH Furniture Transfers Goal: LTG Patient will perform furniture transfers w/assist (OT/PT) Description: LTG: Patient will perform furniture transfers  with assistance (OT/PT). 07/17/2023 2251 by Loel Dubonnet, PT Reactivated 07/17/2023 2244 by Loel Dubonnet, PT Outcome: Not Applicable Flowsheets (Taken 07/17/2023 2244) LTG: Pt will perform furniture transfers with assist:: Moderate Assistance - Patient 50 - 74%   Problem: RH Ambulation Goal: LTG Patient will ambulate in controlled environment (PT) Description: LTG: Patient will ambulate in a controlled environment, # of feet with assistance (PT). 07/17/2023 2251 by Loel Dubonnet, PT Reactivated 07/17/2023 2244 by Loel Dubonnet, PT Outcome: Not Applicable Flowsheets (Taken 07/17/2023 2244) LTG: Pt will ambulate in controlled environ  assist needed:: Maximal Assistance - Patient 25 - 49% LTG: Ambulation distance in controlled environment: 80 ft using LRAD Goal: LTG Patient will ambulate in home environment (PT) Description: LTG: Patient will ambulate in home environment, # of feet with assistance (PT). 07/17/2023 2252 by Loel Dubonnet, PT Outcome: Not Applicable Note: Pt will not be a safe functional ambulator prior to d/c home. He will require additional therapies after discharge from IPR in order  to safely progress toward  ambulation with family.  07/17/2023 2251 by Loel Dubonnet, PT Reactivated 07/17/2023 2244 by Loel Dubonnet, PT Outcome: Not Applicable Note: Pt will not progress to be a safe household ambulator prior to d/c home and will require continued additional therapies to progress.    Problem: RH Wheelchair Mobility Goal: LTG Patient will propel w/c in controlled environment (PT) Description: LTG: Patient will propel wheelchair in controlled environment, # of feet with assist (PT) 07/17/2023 2251 by Loel Dubonnet, PT Reactivated 07/17/2023 2244 by Loel Dubonnet, PT Outcome: Not Applicable Goal: LTG Patient will propel w/c in home environment (PT) Description: LTG: Patient will propel wheelchair in home environment, # of feet with assistance (PT). 07/17/2023 2251 by Loel Dubonnet, PT Reactivated 07/17/2023 2244 by Loel Dubonnet, PT Outcome: Not Applicable

## 2023-07-17 NOTE — Plan of Care (Signed)
  Problem: RH Balance Goal: LTG Patient will maintain dynamic standing with ADLs (OT) Description: LTG:  Patient will maintain dynamic standing balance with assist during activities of daily living (OT)  Flowsheets (Taken 07/17/2023 1304) LTG: Pt will maintain dynamic standing balance during ADLs with: (LTG modified: Pt will hold STATIC stand with mod A or less to enable caregivers to A with clothing management.) Moderate Assistance - Patient 50 - 74% Note: LTG modified: Pt will hold STATIC stand with mod A or less to enable caregivers to A with clothing management.    Problem: Sit to Stand Goal: LTG:  Patient will perform sit to stand in prep for activites of daily living with assistance level (OT) Description: LTG:  Patient will perform sit to stand in prep for activites of daily living with assistance level (OT) Flowsheets (Taken 07/17/2023 1304) LTG: PT will perform sit to stand in prep for activites of daily living with assistance level: (LTG downgraded due to high RLE tone causes movements to be more challenging for patient.) Minimal Assistance - Patient > 75% Note: LTG downgraded due to high RLE tone causes movements to be more challenging for patient.    Problem: RH Toileting Goal: LTG Patient will perform toileting task (3/3 steps) with assistance level (OT) Description: LTG: Patient will perform toileting task (3/3 steps) with assistance level (OT)  Flowsheets (Taken 07/17/2023 1304) LTG: Pt will perform toileting task (3/3 steps) with assistance level: Maximal Assistance - Patient 25 - 49%

## 2023-07-17 NOTE — Progress Notes (Signed)
Physical Therapy Session Note  Patient Details  Name: Zachary Winters MRN: [id number] Date of Birth: 10-Jun-1981  Today's Date: 07/17/2023 PT Individual Time: 0919-1004 PT Individual Time Calculation (min): 45 min   Short Term Goals: Week 2:  PT Short Term Goal 1 (Week 2): Pt will perform bed mobility at overall ModA. PT Short Term Goal 1 - Progress (Week 2): Partly met PT Short Term Goal 2 (Week 2): Pt will perform sit<>stand with overall ModA +1 PT Short Term Goal 2 - Progress (Week 2): Met PT Short Term Goal 3 (Week 2): pt will maintain sitting balance to midline with CGA +1 for at least . PT Short Term Goal 3 - Progress (Week 2): Met PT Short Term Goal 4 (Week 2): pt will perform squat pivot transfer with MinA +1 PT Short Term Goal 4 - Progress (Week 2): Partly met Week 3:  PT Short Term Goal 1 (Week 3): Pt will perform bed mobility at overall ModA. PT Short Term Goal 2 (Week 3): Pt will perform sit<>stand with no AD and modA +1 PT Short Term Goal 3 (Week 3): pt will maintain sitting balance to midline with supervision +1. PT Short Term Goal 4 (Week 3): pt will perform squat pivot transfer with MinA +1  Skilled Therapeutic Interventions/Progress Updates:  Patient supine in bed on entrance to room. Patient alert and agreeable to PT session. Demonstrates ability to say a few more words than when last seen by this therapist!  Patient with no pain complaint at start of session.  Therapeutic Activity: Dressing initiated in supine with maxA to thread RLE and CGA for LLE. Pt initiates pulling up of pants to just below buttocks. Completed upon stance at bedside. Initiates bringing BLE off EOB with MinA to complete. VC provided for push from bed surface but pt initially with difficulty/ weakness and requires Mod/ MaxA to push up to seated position on EOB. Requires LUE support initially to maintain upright seated position. Improves to CGA/ supervision.  Transfers: Pt performed sit<>stand  transfers with MinA for rise-to-stand with LUE support. Then mod/ MaxA for balance once upright. NMR for technique and balance in sit<>stand. Stand pivot transfers throughout session with MinA. Attempts at squat pivot toward L side with Mod A and minimal lift d/t reduced ability to unweight bottom from seat and performs more of a lateral slide requiring assist for lift to reach w/c seat. Provided multimodal cues for technique throughout.  Gait Training:  First ambulation bout with technique reaching 54 ft with Mod/MaxA +2 for balance, upright posture, RLE advancement and stability in stance, and multimodal cueing for sequencing proper timing of technique.   Pt progressed to ambulation back to w/c using HW in LUE. Pt cued for sequencing of HW advancement, then RLE, then LLE stepping throughout. Initially also requires cueing and hand over hand assist for [facility] placement and vc/ tc for step placement of LLE. Requires MaxA +1 with +2 available for [facility] assist and safety throughout return 54 ft amb distance.   Will require lace up shoes in order to attempt AFO trial.   Neuromuscular Re-ed: NMR facilitated during session with focus on sit<>stand technique and standing balance. Pt guided in sit<>stand technique with no AD. When cued to stand from w/c with no L hand support, pt initially states "can't". With encouragement and reminding that therapist and +2 will both be available for assist but only when needed pt willing to try. Reminded to stand using LLE and push from w/c  and hold R foot to floor. Is able to lean toward L, push from w/c and initiate to stand with Max encouragement and CGA/ tc to start and then pt requiring MinA to complete, and then up to Mod/ MaxA to maintain stance. Then slow eccentric return to w/c. Provided with assist for R knee block/ guard and increasing support to UB.   Also guided in x10 minisquats with trace activation felt in glutes and quads initially up to 6 reps, then  back to 0/5. NMR performed for improvements in motor control and coordination, balance, sequencing, judgement, and self confidence/ efficacy in performing all aspects of mobility at highest level of independence.   Patient seated upright in w/c per request at end of session with brakes locked, belt alarm set, and all needs within reach.  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 Restrictions Weight Bearing Restrictions: (P) No General:   Vital Signs:   Pain: Pain Assessment Pain Scale: 0-10 Pain Score: 0-No pain indicated throughout session.   Therapy/Group: Individual Therapy   Loel Dubonnet PT, DPT, CSRS 07/17/2023, 10:29 AM

## 2023-07-17 NOTE — Progress Notes (Signed)
PROGRESS NOTE   Subjective/Complaints:  Pt working with SLP. Indicates he slept well. Denies pain.   ROS: limited due to language/communication    Objective:   No results found. Recent Labs    07/17/23 0642  WBC 6.7  HGB 10.0*  HCT 29.8*  PLT 278     No results for input(s): "NA", "K", "CL", "CO2", "GLUCOSE", "BUN", "CREATININE", "CALCIUM" in the last 72 hours.    Intake/Output Summary (Last 24 hours) at 07/17/2023 1315 Last data filed at 07/17/2023 0815 Gross per 24 hour  Intake 358 ml  Output --  Net 358 ml        Physical Exam: Vital Signs Blood pressure (!) 121/96, pulse 88, temperature 98.8 F (37.1 C), resp. rate 18, height 6\' 1"  (1.854 m), weight 80.6 kg, SpO2 100%.   Constitutional: No distress . Vital signs reviewed. HEENT: NCAT, EOMI, oral membranes moist Neck: supple Cardiovascular: RRR without murmur. No JVD    Respiratory/Chest: CTA Bilaterally without wheezes or rales. Normal effort    GI/Abdomen: BS +, non-tender, non-distended Ext: no clubbing, cyanosis, or edema Psych: pleasant and cooperative  Skin: No evidence of breakdown, no evidence of rash  Neurologic:  + Aphasic + dysarthric, can appropriately respond to  y/n questions verbally. Speaking in short phrases today. Oriented to self , ?place  Cranial nerves II through XII intact, motor strength is 5/5 in left and 0/5 right  deltoid, bicep, tricep, grip, hip flexor, knee extensors, ankle dorsiflexor and plantar flexor MAS 3 R elbow, knee, and hip--able to stretch right heel to neutral.  Sensory exam endorsing slightly reduced sensation diffusely on the right Musculoskeletal: Full range of motion in all 4 extremities. No joint swelling  Assessment/Plan: 1. Functional deficits which require 3+ hours per day of interdisciplinary therapy in a comprehensive inpatient rehab setting. Physiatrist is providing close team supervision and 24  hour management of active medical problems listed below. Physiatrist and rehab team continue to assess barriers to discharge/monitor patient progress toward functional and medical goals  Care Tool:  Bathing        Body parts bathed by helper: Right arm, Left arm, Chest, Abdomen, Front perineal area, Buttocks, Right upper leg, Left upper leg, Face, Left lower leg, Right lower leg     Bathing assist Assist Level: Dependent - Patient 0%     Upper Body Dressing/Undressing Upper body dressing   What is the patient wearing?: Pull over shirt    Upper body assist Assist Level: Total Assistance - Patient < 25%    Lower Body Dressing/Undressing Lower body dressing      What is the patient wearing?: Underwear/pull up, Incontinence brief     Lower body assist Assist for lower body dressing: Dependent - Patient 0%     Toileting Toileting    Toileting assist Assist for toileting: Dependent - Patient 0%     Transfers Chair/bed transfer  Transfers assist  Chair/bed transfer activity did not occur: Safety/medical concerns  Chair/bed transfer assist level: Dependent - mechanical lift (STEDY)     Locomotion Ambulation   Ambulation assist      Assist level: Maximal Assistance - Patient 25 - 49% Assistive device:  Other (comment) (L hallway handrail) Max distance: 35 ft   Walk 10 feet activity   Assist     Assist level: Maximal Assistance - Patient 25 - 49% Assistive device: Other (comment) (L hallway hand rail)   Walk 50 feet activity   Assist Walk 50 feet with 2 turns activity did not occur: Safety/medical concerns         Walk 150 feet activity   Assist Walk 150 feet activity did not occur: Safety/medical concerns         Walk 10 feet on uneven surface  activity   Assist Walk 10 feet on uneven surfaces activity did not occur: Safety/medical concerns         Wheelchair     Assist Is the patient using a wheelchair?: Yes Type of  Wheelchair: Manual    Wheelchair assist level: Dependent - Patient 0% Max wheelchair distance: 150 ft    Wheelchair 50 feet with 2 turns activity    Assist        Assist Level: Dependent - Patient 0%   Wheelchair 150 feet activity     Assist      Assist Level: Dependent - Patient 0%   Blood pressure (!) 121/96, pulse 88, temperature 98.8 F (37.1 C), resp. rate 18, height 6\' 1"  (1.854 m), weight 80.6 kg, SpO2 100%.  Medical Problem List and Plan: 1. 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 Severe Left M1 and V4 stenosis              -patient may  shower             -ELOS/Goals: 7/30 min A for PT, OT and SLP- family training on Sat    -Continue CIR therapies including PT, OT SLP  2.  Antithrombotics: -DVT/anticoagulation:  Pharmaceutical: Lovenox, LE doppler neg              -antiplatelet therapy: DAPT for at leasat  3 months. Has declined stenting.   3. Pain Management: Tylenol prn.  4. Mood/Behavior/Sleep: LCSW to follow   for evaluation and support when appropriate.              -antipsychotic agents: N/A 5. Neuropsych/cognition: This patient is not capable of making decisions on his own behalf. 6. Skin/Wound Care: Routine pressure relief measures.   7. Fluids/Electrolytes/Nutrition:   -intake a little more consistent yesterday  -continue IVF at [facility]  -labs pending for 7/15 - stable 7/19--no labs 7/22  8. 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 HTN: Monitor BP TID--long term goal normotensive             --continue amlodipine 10 mg/day and coreg  6.25 mg BID.  Vitals:   07/16/23 2037 07/17/23 0554  BP: 133/88 (!) 121/96  Pulse: 91 88  Resp: 18 18  Temp: 98.7 F (37.1 C) 98.8 F (37.1 C)  SpO2: 100% 100%   Keep in 130-150 range - in range; appropriate 7/20-21  10. T2DM w/peripheral neuropathy: Hgb  A1c- 11.7. Was on Lantus 30 units PTA?  --Continue Insulin glargline-->was decreased to  5 units/HS today(to start tomorrow as received 10 units this am).   --Hold every 4 hours novolog as not on TF at this moment.  CBG (last 3)  Recent Labs    07/16/23 2123 07/17/23 0616 07/17/23 1211  GLUCAP 235* 129* 165*   Increased Semglee to  15U 7/7 Change SSI to moderate  7/14 increase semglee to 22 u d/t persistent elevation 7/19 increase semglee to 25 U, consider low dose glipizide in am  7/20-21: generally has been controlled  11. Acute on chronic renal failure?:  Improved with IVF for hydration-->was d/c yesterday but will resume with questionable intake --.SCr 1.02 in 09/22 per chart review. SCr 1.57 @ admission-->1.29-->1.31 PO4 and Mg ok 7/7 Off IVF monitor UO--probably close to baseline   12. Severe dysphagia: Continue D1, honey liquids. Willing to take liquids-->will offer thickened Glucerna with meals for now.         13. Hyperlipidemia: LDL 265. On Lipitor 80 mg daily. 14. Severe vitamin D deficiency: On Ergocalciferol weekly with dialy low supplement.  15. ABLA: Hgb 13.1 @ admission-->11.1-->9.2. Will order stool guaiacs- none collected 7/5.                 Latest Ref Rng & Units 07/17/2023    6:42 AM 07/10/2023    6:58 AM 07/03/2023    9:23 AM  CBC  WBC 4.0 - 10.5 K/uL 6.7  7.9  9.9   Hemoglobin 13.0 - 17.0 g/dL 21.3  9.9  08.6   Hematocrit 39.0 - 52.0 % 29.8  29.9  32.4   Platelets 150 - 400 K/uL 278  338  361    Fluctuates 10-11 g/dL range--stable 5/78  16. Low grade fevers: Encourage pulmonary hygiene.  17. Hx tobacco/Marijuana use: Continue  nicotine patch.  Encourage cessation of Marijuana.  18. Intermittent episodes of N/V: Question due to gastroparesis (Multiple ED visits in the past for intractable N/V). 19. Nasal congestion- ordered Flonase 1 sprays daily.   21.  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  Spasticity severe elbow flexor tone, may need Botox / Xeomin prior to d/c, cont ROM    - Reminder to nursing to don WHO at nighhttime 7/20; R ankle ROM still WNL, no PRAFO in room, if not available would consider ordering this week before PF tone goes past neutral   - 7/21: D/w PT, start scheduled Baclofen 10 mg TID in addition to current Zanaflex.   7/22--tolerating baclofen so far.  Tone stable to improved comparatively to last time I saw him.   LOS: 20 days A FACE TO FACE EVALUATION WAS PERFORMED  Ranelle Oyster 07/17/2023, 1:15 PM

## 2023-07-17 NOTE — Progress Notes (Signed)
Occupational Therapy Session Note  Patient Details  Name: Zachary Winters MRN: [id number] Date of Birth: 11-May-1981  Today's Date: 07/17/2023 OT Individual Time: 1500-1530 OT Individual Time Calculation (min): 30 min    Short Term Goals: Week 2:  OT Short Term Goal 1 (Week 2): pt will complete squat pivot transfers with MOD A +2  as precursor to safe toilet transfers OT Short Term Goal 1 - Progress (Week 2): Met OT Short Term Goal 2 (Week 2): pt will complete sit>stands with MODA +2 to improve pts ability to sit>stand for LB dressing OT Short Term Goal 2 - Progress (Week 2): Met OT Short Term Goal 3 (Week 2): pt will sit EOB with CGA for > 5 mins as precursor to higher level ADLs from EOB OT Short Term Goal 3 - Progress (Week 2): Met  Skilled Therapeutic Interventions/Progress Updates:     Pt received lightly sleeping in bed waking upon OT arrival. Pt presenting with expressive aphasia- utilized simple yes/no questions and gestures throughout session to support optimal communication. Pt presenting to be in good spirits receptive to skilled OT session reporting 0/10 pain- OT offering intermittent rest breaks, repositioning, and therapeutic support to optimize participation in therapy session. Focus this session functional transfer training to increase independence and safety during toileting transfers. Pt transitioned to EOB with light mod A to bring RLE off EOB and lift trunk. Education provided on modified technique of hooking LLE around RLE to bring it off EOB with Pt receptive to education, however increased challenge implementing technique d/t increase flexor tone during movement. Worked on [place] transfers to [facility] placed over top of toilet with Pt to increase safety with nursing staff in attempt to begin toilet training in bathroom vs on [facility] in room. Pt able to pull on grab bar with LUE to rise to standing in stedy with heavy min A and OT providing mod tactile and verbal cues for weight  shifting and midline orientation. Pt able to maintain sitting balance with close supervision while OT transported stedy to bathroom, however he did attempt to stand up x1 trial during transport requiring max verbal and tactile cues to sit for safety. Once in bathroom, Pt attempting to hold onto grab bars beside toilet vs maintain grasp on stedy bar creating increased challenge to position stedy over toilet and requiring mod tactile and verbal cues to hold stedy grab bar. Worked on completing sit<>stands and simulating toileting tasks with Pt requiring light mod to rise to standing and mod tactile cues for weight shifting to L side for standing balance. Transported Pt back to room in steady with Pt able to maintain sitting balance supervision. EOB>supine mod A with mod verbal cues provided for technique. Increased practice opportunities using steady to transfer to toilet in bathroom recommended before Pt uses this transfer technique with nursing staff. Pt was left resting in bed with call bell in reach, bed alarm on, and all needs met.    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 Restrictions Weight Bearing Restrictions: No    Therapy/Group: Individual Therapy  Army Fossa 07/17/2023, 3:34 PM

## 2023-07-17 NOTE — Progress Notes (Signed)
Speech Language Pathology Daily Session Note  Patient Details  Name: Zachary Winters MRN: [id number] Date of Birth: 1981/09/12  Today's Date: 07/17/2023 SLP Individual Time: 0800-0901 SLP Individual Time Calculation (min): 61 min  Short Term Goals: Week 3: SLP Short Term Goal 1 (Week 3): Pt will tolerate Dys 3 diet and thin liquids with minimal s/s of aspiration and no indications of development of aspiration pneumonia SLP Short Term Goal 2 (Week 3): Pt will follow simple one step directions with supervision visual/verbal cues SLP Short Term Goal 3 (Week 3): Pt will complete functional naming tasks (automatic responses, responsive naming, simple confrontational naming, etc) with 80% accuracy given modA visual/verbal cues SLP Short Term Goal 4 (Week 3): Pt will utilize multimodal communication to express simple wants/needs with modA visual/verbal cues SLP Short Term Goal 5 (Week 3): Pt will consistently utilize safe swallow strategies during PO intake w/ supervision cues.  Skilled Therapeutic Interventions:  Pt was seen in am to address dysphagia management and speech. Pt was easily alerted upon SLP arrival. He was repositioned upright by assist of this SLP and NT. Pt required moderate encouragement for completion of oral hygiene where he ultimately allowed SLP to complete task. Thorough oral hygiene completed with good tolerance. Pt was presented with current diet of dysphagia 3 solids and thin liquids. SLP provided set up A and pt administered all trials. Min A for complete oral clearance between trials and small single sips as opposed to consecutive sips across session. At conclusion of meal pt completed finger sweep and multiple sips of thin liquids to clear R buccal cavity. Pt tolerated meal with no s/sx pen/asp. In additional minutes of session, SLP addressed functional naming through challenging pt in confrontational naming task. Pt completed task with 71% acc. SLP further challenged pt in  articulation of words. Pt continues to present with initial consonant deletion of words; for example pt said [atorade] for /gatorade/ and [anana] for /banana/. Pt stimulable for /b/ and /f/ sound and able to produce banana and forty with mod A. Less stimulability observed with /c/ /sp/ /t/ sounds. Pt consistently able to produce some intelligible speech across session with improved consistency of yes, no, I don't know. He also intelligibly produced, "hello, how are you" with deletion of initial /h/ consonant. Direct hand off to nurse at conclusion of session. SLP to continue POC.  Pain Pain Assessment Pain Scale: 0-10 Pain Score: 0-No pain  Therapy/Group: Individual Therapy  Renaee Munda 07/17/2023, 8:59 AM

## 2023-07-18 LAB — GLUCOSE, CAPILLARY
Glucose-Capillary: 140 mg/dL — ABNORMAL HIGH (ref 70–99)
Glucose-Capillary: 144 mg/dL — ABNORMAL HIGH (ref 70–99)
Glucose-Capillary: 138 mg/dL — ABNORMAL HIGH (ref 70–99)
Glucose-Capillary: 166 mg/dL — ABNORMAL HIGH (ref 70–99)

## 2023-07-18 MED ORDER — GLIMEPIRIDE 1 MG PO TABS
1.0000 mg | ORAL_TABLET | Freq: Every day | ORAL | Status: DC
  Administered 2023-07-18 – 2023-07-20 (×3): 1 mg via ORAL
  Filled 2023-07-18 (×3): qty 1

## 2023-07-18 NOTE — Progress Notes (Signed)
Occupational Therapy Session Note  Patient Details  Name: Zachary Winters MRN: [id number] Date of Birth: Dec 08, 1981  Today's Date: 07/18/2023 OT Individual Time: 7829-5621 OT Individual Time Calculation (min): 75 min    Short Term Goals: Week 1:  OT Short Term Goal 1 (Week 1): pt will be able to hold static sit balance with CGA to enable him to sit safely on a BSC. OT Short Term Goal 1 - Progress (Week 1): Met OT Short Term Goal 2 (Week 1): Pt will be able to sit to stand in a stedy lift with mod A to enable a safe transfer to a BSC. OT Short Term Goal 2 - Progress (Week 1): Met OT Short Term Goal 3 (Week 1): Pt will demonstrate improved R side attention by completing RUE self ROM to prevent contractures due to hypertone, OT Short Term Goal 3 - Progress (Week 1): Progressing toward goal OT Short Term Goal 4 (Week 1): Pt will sit to stand with max A to enable care givers to pull clothing up over hips. OT Short Term Goal 4 - Progress (Week 1): Met Week 2:  OT Short Term Goal 1 (Week 2): pt will complete squat pivot transfers with MOD A +2  as precursor to safe toilet transfers OT Short Term Goal 1 - Progress (Week 2): Met OT Short Term Goal 2 (Week 2): pt will complete sit>stands with MODA +2 to improve pts ability to sit>stand for LB dressing OT Short Term Goal 2 - Progress (Week 2): Met OT Short Term Goal 3 (Week 2): pt will sit EOB with CGA for > 5 mins as precursor to higher level ADLs from EOB OT Short Term Goal 3 - Progress (Week 2): Met Week 3:  OT Short Term Goal 1 (Week 3): STGs= LTGs  Skilled Therapeutic Interventions/Progress Updates:    Pt received in w/c ready for therapy.  Focus of therapy session on safe transfers and sit to stands during ADLs.  Pt completed shower and dressing - see ADL documentation below. His family provide a long handled sponge and pt able to use that well to complete bathing with modified strategies.  No LOB with sitting on tub bench.   For dressing  continues to need mod cues for hemi dressing strategies.  Had pt complete sit to stands using L hand on bed rail for support. He can rise to standing with only min A but immediately needs mod - max and sometimes total A to hold safe balance. On the first trial, had pt sit immediately as he was falling to the right and OT unable to keep him stable.  On 2nd and 3rd trials improved to mod/max so OT could support him while rehab tech pulled pants up.    Pt taken to gym to work on self ROM exercises using table top slides.  Pt's UE tone decreased this session and pt able to achieve full PROM.    Good participation from patient.   Pt returned to room.  Pt declined use of arm lap tray.  Resting in wc with all needs met, alarm on, call light in reach.       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 Restrictions Weight Bearing Restrictions: No      Pain: Pain Assessment Pain Scale: 0-10 Pain Score: 0-No pain ADL: ADL Eating: Supervision/safety Grooming: Minimal cueing, Minimal assistance Where Assessed-Grooming: Sitting at sink Upper Body Bathing: Supervision/safety (using long sponge)  Where Assessed-Upper Body Bathing: Shower Lower Body Bathing: Minimal assistance (Assist for buttocks only, used long sponge for feet) Where Assessed-Lower Body Bathing: Shower Upper Body Dressing: Minimal assistance, Moderate cueing Where Assessed-Upper Body Dressing: Wheelchair Lower Body Dressing: Maximal assistance Where Assessed-Lower Body Dressing: Wheelchair Toileting: Dependent Where Assessed-Toileting: Bedside Commode, Actuary Transfer: Moderate assistance Toilet Transfer Method: Ambulance person: Psychiatric nurse: Moderate assistance, Moderate cueing Film/video editor Method: Administrator: Emergency planning/management officer,  Grab bars Therapy/Group: Individual Therapy  Margarette Vannatter 07/18/2023, 12:49 PM

## 2023-07-18 NOTE — Progress Notes (Addendum)
PROGRESS NOTE   Subjective/Complaints:  Pt with SLP this morning again. Finishing up breakfast. Seems to like what was on his tray including OJ. Indicates he doesn't hurt and that he doesn't have pain.  ROS: limited due to language/communication    Objective:   No results found. Recent Labs    07/17/23 0642  WBC 6.7  HGB 10.0*  HCT 29.8*  PLT 278     No results for input(s): "NA", "K", "CL", "CO2", "GLUCOSE", "BUN", "CREATININE", "CALCIUM" in the last 72 hours.    Intake/Output Summary (Last 24 hours) at 07/18/2023 0924 Last data filed at 07/17/2023 1841 Gross per 24 hour  Intake 413 ml  Output --  Net 413 ml        Physical Exam: Vital Signs Blood pressure 124/86, pulse 93, temperature 97.6 F (36.4 C), resp. rate 18, height 6\' 1"  (1.854 m), weight 81.6 kg, SpO2 100%.   Constitutional: No distress . Vital signs reviewed. HEENT: NCAT, EOMI, oral membranes moist Neck: supple Cardiovascular: RRR without murmur. No JVD    Respiratory/Chest: CTA Bilaterally without wheezes or rales. Normal effort    GI/Abdomen: BS +, non-tender, non-distended Ext: no clubbing, cyanosis, or edema Psych: pleasant and cooperative   Skin: No evidence of breakdown, no evidence of rash  Neurologic:  + Aphasic + dysarthric, responds with one word/short phrases, at least 50%  Cranial nerves II through XII intact, motor strength is 5/5 in left and 0/5 right  deltoid, bicep, tricep, grip, hip flexor, knee extensors, ankle dorsiflexor and plantar flexor MAS 3 R elbow, knee, and hip--able to stretch right heel to neutral with effort.  Sensory exam endorsing slightly reduced sensation diffusely on the right Musculoskeletal: Full range of motion in all 4 extremities. No joint swelling  Assessment/Plan: 1. Functional deficits which require 3+ hours per day of interdisciplinary therapy in a comprehensive inpatient rehab  setting. Physiatrist is providing close team supervision and 24 hour management of active medical problems listed below. Physiatrist and rehab team continue to assess barriers to discharge/monitor patient progress toward functional and medical goals  Care Tool:  Bathing        Body parts bathed by helper: Right arm, Left arm, Chest, Abdomen, Front perineal area, Buttocks, Right upper leg, Left upper leg, Face, Left lower leg, Right lower leg     Bathing assist Assist Level: Dependent - Patient 0%     Upper Body Dressing/Undressing Upper body dressing   What is the patient wearing?: Pull over shirt    Upper body assist Assist Level: Total Assistance - Patient < 25%    Lower Body Dressing/Undressing Lower body dressing      What is the patient wearing?: Underwear/pull up, Incontinence brief     Lower body assist Assist for lower body dressing: Dependent - Patient 0%     Toileting Toileting    Toileting assist Assist for toileting: Dependent - Patient 0%     Transfers Chair/bed transfer  Transfers assist  Chair/bed transfer activity did not occur: Safety/medical concerns  Chair/bed transfer assist level: Dependent - mechanical lift (STEDY)     Locomotion Ambulation   Ambulation assist      Assist  level: Maximal Assistance - Patient 25 - 49% Assistive device: Other (comment) (L hallway handrail) Max distance: 35 ft   Walk 10 feet activity   Assist     Assist level: Maximal Assistance - Patient 25 - 49% Assistive device: Other (comment) (L hallway hand rail)   Walk 50 feet activity   Assist Walk 50 feet with 2 turns activity did not occur: Safety/medical concerns         Walk 150 feet activity   Assist Walk 150 feet activity did not occur: Safety/medical concerns         Walk 10 feet on uneven surface  activity   Assist Walk 10 feet on uneven surfaces activity did not occur: Safety/medical concerns          Wheelchair     Assist Is the patient using a wheelchair?: Yes Type of Wheelchair: Manual    Wheelchair assist level: Dependent - Patient 0% Max wheelchair distance: 150 ft    Wheelchair 50 feet with 2 turns activity    Assist        Assist Level: Dependent - Patient 0%   Wheelchair 150 feet activity     Assist      Assist Level: Dependent - Patient 0%   Blood pressure 124/86, pulse 93, temperature 97.6 F (36.4 C), resp. rate 18, height 6\' 1"  (1.854 m), weight 81.6 kg, SpO2 100%.  Medical Problem List and Plan: 1. 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 Severe Left M1 and V4 stenosis              -patient may  shower             -ELOS/Goals: 7/30 min A for PT, OT and SLP- family training on Sat    -Continue CIR therapies including PT, OT, and SLP   2.  Antithrombotics: -DVT/anticoagulation:  Pharmaceutical: Lovenox, LE doppler neg              -antiplatelet therapy: DAPT for at leasat  3 months. Has declined stenting.   3. Pain Management: Tylenol prn.  4. Mood/Behavior/Sleep: LCSW to follow   for evaluation and support when appropriate.              -antipsychotic agents: N/A 5. Neuropsych/cognition: This patient is not capable of making decisions on his own behalf. 6. Skin/Wound Care: Routine pressure relief measures.   7. Fluids/Electrolytes/Nutrition:   -intake a little more consistent yesterday  -continue IVF at [facility]  -labs pending for 7/15 - stable 7/19--no labs 7/22--recheck BMET Thursday  8. 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 HTN: Monitor BP TID--long term goal normotensive             --continue amlodipine 10 mg/day and coreg  6.25 mg BID.  Vitals:   07/17/23 2000 07/18/23 0444  BP: 121/82 124/86  Pulse: 85 93  Resp: 18   Temp: 98.8 F (37.1 C) 97.6 F (36.4 C)  SpO2: 98% 100%    Keep in 130-150 range - in range; appropriate 7/20-23  10. T2DM w/peripheral neuropathy: Hgb A1c- 11.7. Was on Lantus 30 units PTA?  --Continue Insulin glargline-->was decreased to  5 units/HS today(to start tomorrow as received 10 units this am).   --Hold every 4 hours novolog as not on TF at this moment.  CBG (last 3)  Recent Labs    07/17/23 1654 07/17/23 2048  07/18/23 0610  GLUCAP 147* 273* 140*   Increased Semglee to 15U 7/7 Change SSI to moderate  7/14 increase semglee to 22 u d/t persistent elevation 7/19 increase semglee to 25 U, consider low dose glipizide in am  7/23- elevation last night again. Add low dose glimepiride today  11. Acute on chronic renal failure?:  Improved with IVF for hydration-->was d/c yesterday but will resume with questionable intake --.SCr 1.02 in 09/22 per chart review. SCr 1.57 @ admission-->1.29-->1.31 PO4 and Mg ok 7/7 Off IVF monitor UO--probably close to baseline   12. Severe dysphagia: Continue D1, honey liquids. Willing to take liquids-->will offer thickened Glucerna with meals for now.         13. Hyperlipidemia: LDL 265. On Lipitor 80 mg daily. 14. Severe vitamin D deficiency: On Ergocalciferol weekly with dialy low supplement.  15. ABLA: Hgb 13.1 @ admission-->11.1-->9.2. Will order stool guaiacs- none collected 7/5.                 Latest Ref Rng & Units 07/17/2023    6:42 AM 07/10/2023    6:58 AM 07/03/2023    9:23 AM  CBC  WBC 4.0 - 10.5 K/uL 6.7  7.9  9.9   Hemoglobin 13.0 - 17.0 g/dL 45.4  9.9  09.8   Hematocrit 39.0 - 52.0 % 29.8  29.9  32.4   Platelets 150 - 400 K/uL 278  338  361    Fluctuates 10-11 g/dL range--stable 1/19  16. Low grade fevers: Encourage pulmonary hygiene.  17. Hx tobacco/Marijuana use: Continue  nicotine patch.  Encourage cessation of Marijuana.  18. Intermittent episodes of N/V: Question due to gastroparesis (Multiple ED visits in the past for intractable N/V). 19. Nasal congestion- ordered Flonase 1  sprays daily.   21.  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  Spasticity severe elbow flexor tone, may need Botox / Xeomin prior to d/c, cont ROM    - Reminder to nursing to don WHO at nighhttime 7/20; R ankle ROM still WNL, no PRAFO in room, if not available would consider ordering this week before PF tone goes past neutral   - 7/21: D/w PT, start scheduled Baclofen 10 mg TID in addition to current Zanaflex.   7/23--tolerating baclofen so far.  Tone stable to worse at times. Consider further titration tomorrow   LOS: 21 days A FACE TO FACE EVALUATION WAS PERFORMED  Ranelle Oyster 07/18/2023, 9:24 AM

## 2023-07-18 NOTE — Progress Notes (Signed)
Physical Therapy Session Note  Patient Details  Name: Zachary Winters MRN: [id number] Date of Birth: 1981-03-06  Today's Date: 07/18/2023 PT Individual Time: 1302-1406 PT Individual Time Calculation (min): 64 min   Short Term Goals: Week 2:  PT Short Term Goal 1 (Week 2): Pt will perform bed mobility at overall ModA. PT Short Term Goal 1 - Progress (Week 2): Partly met PT Short Term Goal 2 (Week 2): Pt will perform sit<>stand with overall ModA +1 PT Short Term Goal 2 - Progress (Week 2): Met PT Short Term Goal 3 (Week 2): pt will maintain sitting balance to midline with CGA +1 for at least . PT Short Term Goal 3 - Progress (Week 2): Met PT Short Term Goal 4 (Week 2): pt will perform squat pivot transfer with MinA +1 PT Short Term Goal 4 - Progress (Week 2): Partly met Week 3:  PT Short Term Goal 1 (Week 3): Pt will perform bed mobility at overall ModA. PT Short Term Goal 2 (Week 3): Pt will perform sit<>stand with no AD and modA +1 PT Short Term Goal 3 (Week 3): pt will maintain sitting balance to midline with supervision +1. PT Short Term Goal 4 (Week 3): pt will perform squat pivot transfer with MinA +1  Skilled Therapeutic Interventions/Progress Updates:  Patient seated upright in w/c on entrance to room. Asleep but easily roused. Patient alert and agreeable to PT session.   Patient with no pain complaint at start of session.  Therapeutic Activity:  Transfers: Pt performed sit<>stand transfers from w/c to [facility] with underarm support on R side throughout session with Min/ modA. Provided verbal cues for technique that breaks down with attempt for decreased assist.   Gait Training:  Pt taken to Litegait to introduce pt to conecpt of use of harness system to ambulate overground. Pt adamant initially that he does not want to try. Slowly becomes more accustomed to idea but reminded he does not have to perform in Litegait today, but potentially this week.   Pt ambulated 41' x1/ 65' x1  using HW with MaxA/ intermittent noted quad and glute activation on RLE in order to maintain extension in stance phase. Continued cuing required for sequencing of HW advancement, then RLE, then LLE stepping throughout. Initially also requires cueing and hand over hand assist for [facility] placement and vc/ tc for step placement of LLE. Requires MaxA +1 with +2 available for [facility] assist and safety throughout.  Wheelchair Mobility:  Pt propelled wheelchair 160 feet x2 requiring supervision with int CGA assist at end of session when demos difficulty maintaining straight path and unable to turn away from wall on R side just prior to door to pt's room. Provided vc/ tc for technique throughout.  Pt upset at end of session and reminded pt that CLOF is far improved from PLOF on admission. States, "I know."  Contacted SW for informing family of need for proper sized lace up tennis shoes for AFO trial.   Patient supine in bed at end of session with brakes locked, bed alarm set, and all needs within reach.  Pt missed 6 min of skilled therapy due to refusal to continue and fatigue. Will re-attempt as schedule and pt availability permits.   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 Restrictions Weight Bearing Restrictions: No General: PT Amount of Missed Time (min): 11 Minutes PT Missed Treatment Reason: Patient unwilling to participate;Patient fatigue Vital Signs: Therapy Vitals Temp:  98.6 F (37 C) Temp Source: Oral Pulse Rate: 82 Resp: 18 BP: 134/83 Patient Position (if appropriate): Sitting Oxygen Therapy SpO2: 100 % O2 Device: Room Air Pain:  No pain related this session.   Therapy/Group: Individual Therapy  Loel Dubonnet PT, DPT, CSRS 07/18/2023, 2:33 PM

## 2023-07-18 NOTE — Progress Notes (Signed)
Speech Language Pathology Daily Session Note  Patient Details  Name: Zachary Winters MRN: [id number] Date of Birth: [birth year]  Today's Date: 07/18/2023 SLP Individual Time: 0801-0900 SLP Individual Time Calculation (min): 59 min  Short Term Goals: Week 3: SLP Short Term Goal 1 (Week 3): Pt will tolerate Dys 3 diet and thin liquids with minimal s/s of aspiration and no indications of development of aspiration pneumonia SLP Short Term Goal 2 (Week 3): Pt will follow simple one step directions with supervision visual/verbal cues SLP Short Term Goal 3 (Week 3): Pt will complete functional naming tasks (automatic responses, responsive naming, simple confrontational naming, etc) with 80% accuracy given modA visual/verbal cues SLP Short Term Goal 4 (Week 3): Pt will utilize multimodal communication to express simple wants/needs with modA visual/verbal cues SLP Short Term Goal 5 (Week 3): Pt will consistently utilize safe swallow strategies during PO intake w/ supervision cues.  Skilled Therapeutic Interventions:  Pt was seen in am to address dysphagia management and aphasia and apraxia of speech. Pt alert upon SLP arrival and seen in bed. Pt assisted this SLP in repositioning upright. Min encouragement required for completion of oral hygiene with pt ultimatley allowing SLP to complete thorough oral hygiene. Pt was presented with current diet of mDys 3 solids and thin liquids. He administered all trials with sup to min A for oral clearance throughout meal and single sips of liquid at a time. Pt with no s/sx pen/asp throughout meal. In additional minutes of session, SLP addressed naming and following single step directions. Given items presented visually, pt named items with 56% acc indep improving to 67% with min A. SLP further challenged pt in accurate production of words. Pt remained stimulable for /b/ sound so focus placed on those words this date. Pt initially requiring max cues for production of /b/ in  isolation and in initial position of words. As trials progressed pt warranting mod verbal and visual cues. He produced the following words; bed, bye, ball, boy, baby. Other sounds targeted however; no stimulability observed at this moment included /s/, /t/, and /k/. Pt also challenged in following 1 step directions. Pt completed task with 33% acc indep improving to 89% acc with min A. Pt was left at bedside with call button within reach and bed alarm active. SLP to continue POC.   Pain Pain Assessment Pain Scale: 0-10 Pain Score: 0-No pain  Therapy/Group: Individual Therapy  Renaee Munda 07/18/2023, 8:57 AM

## 2023-07-19 LAB — GLUCOSE, CAPILLARY
Glucose-Capillary: 115 mg/dL — ABNORMAL HIGH (ref 70–99)
Glucose-Capillary: 202 mg/dL — ABNORMAL HIGH (ref 70–99)
Glucose-Capillary: 131 mg/dL — ABNORMAL HIGH (ref 70–99)
Glucose-Capillary: 159 mg/dL — ABNORMAL HIGH (ref 70–99)

## 2023-07-19 MED ORDER — BACLOFEN 5 MG HALF TABLET
15.0000 mg | ORAL_TABLET | Freq: Three times a day (TID) | ORAL | Status: DC
  Administered 2023-07-19 – 2023-07-25 (×17): 15 mg via ORAL
  Filled 2023-07-19 (×17): qty 1

## 2023-07-19 NOTE — Progress Notes (Signed)
PROGRESS NOTE   Subjective/Complaints:  No problems overnight. In good spirits. Denies pain.   ROS: limited due to language/communication    Objective:   No results found. Recent Labs    07/17/23 0642  WBC 6.7  HGB 10.0*  HCT 29.8*  PLT 278     No results for input(s): "NA", "K", "CL", "CO2", "GLUCOSE", "BUN", "CREATININE", "CALCIUM" in the last 72 hours.    Intake/Output Summary (Last 24 hours) at 07/19/2023 0844 Last data filed at 07/18/2023 1758 Gross per 24 hour  Intake 598 ml  Output --  Net 598 ml        Physical Exam: Vital Signs Blood pressure (!) 132/90, pulse 86, temperature 97.6 F (36.4 C), resp. rate 16, height 6\' 1"  (1.854 m), weight 81.4 kg, SpO2 98%.   Constitutional: No distress . Vital signs reviewed. HEENT: NCAT, EOMI, oral membranes moist Neck: supple Cardiovascular: RRR without murmur. No JVD    Respiratory/Chest: CTA Bilaterally without wheezes or rales. Normal effort    GI/Abdomen: BS +, non-tender, non-distended Ext: no clubbing, cyanosis, or edema Psych: pleasant and cooperative  Skin: No evidence of breakdown, no evidence of rash  Neurologic:  + Aphasic + dysarthric, responds with one word/short phrases with extra time.   Cranial nerves II through XII intact, motor strength is 5/5 in left and 0/5 right  deltoid, bicep, tricep, grip, hip flexor, knee extensors, ankle dorsiflexor and plantar flexor MAS 3 R elbow, knee, and hip--able to stretch right heel to neutral with effort.  Sensory exam endorsing slightly reduced sensation diffusely on the right Musculoskeletal: other than spasticity no joint related problems. No joint swelling  Assessment/Plan: 1. Functional deficits which require 3+ hours per day of interdisciplinary therapy in a comprehensive inpatient rehab setting. Physiatrist is providing close team supervision and 24 hour management of active medical problems listed  below. Physiatrist and rehab team continue to assess barriers to discharge/monitor patient progress toward functional and medical goals  Care Tool:  Bathing        Body parts bathed by helper: Right arm, Left arm, Chest, Abdomen, Front perineal area, Buttocks, Right upper leg, Left upper leg, Face, Left lower leg, Right lower leg     Bathing assist Assist Level: Dependent - Patient 0%     Upper Body Dressing/Undressing Upper body dressing   What is the patient wearing?: Pull over shirt    Upper body assist Assist Level: Total Assistance - Patient < 25%    Lower Body Dressing/Undressing Lower body dressing      What is the patient wearing?: Underwear/pull up, Incontinence brief     Lower body assist Assist for lower body dressing: Dependent - Patient 0%     Toileting Toileting    Toileting assist Assist for toileting: Dependent - Patient 0%     Transfers Chair/bed transfer  Transfers assist  Chair/bed transfer activity did not occur: Safety/medical concerns  Chair/bed transfer assist level: Dependent - mechanical lift (STEDY)     Locomotion Ambulation   Ambulation assist      Assist level: Maximal Assistance - Patient 25 - 49% Assistive device: Other (comment) (L hallway handrail) Max distance: 35 ft  Walk 10 feet activity   Assist     Assist level: Maximal Assistance - Patient 25 - 49% Assistive device: Other (comment) (L hallway hand rail)   Walk 50 feet activity   Assist Walk 50 feet with 2 turns activity did not occur: Safety/medical concerns         Walk 150 feet activity   Assist Walk 150 feet activity did not occur: Safety/medical concerns         Walk 10 feet on uneven surface  activity   Assist Walk 10 feet on uneven surfaces activity did not occur: Safety/medical concerns         Wheelchair     Assist Is the patient using a wheelchair?: Yes Type of Wheelchair: Manual    Wheelchair assist level: Dependent  - Patient 0% Max wheelchair distance: 150 ft    Wheelchair 50 feet with 2 turns activity    Assist        Assist Level: Dependent - Patient 0%   Wheelchair 150 feet activity     Assist      Assist Level: Dependent - Patient 0%   Blood pressure (!) 132/90, pulse 86, temperature 97.6 F (36.4 C), resp. rate 16, height 6\' 1"  (1.854 m), weight 81.4 kg, SpO2 98%.  Medical Problem List and Plan: 1. 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 Severe Left M1 and V4 stenosis              -patient may  shower             -ELOS/Goals: 7/30 min A for PT, OT and SLP- family training on Sat    -Continue CIR therapies including PT, OT, and SLP. Interdisciplinary team conference today to discuss goals, barriers to discharge, and dc planning.     2.  Antithrombotics: -DVT/anticoagulation:  Pharmaceutical: Lovenox, LE doppler neg              -antiplatelet therapy: DAPT for at leasat  3 months. Has declined stenting.   3. Pain Management: Tylenol prn.  4. Mood/Behavior/Sleep: LCSW to follow   for evaluation and support when appropriate.              -antipsychotic agents: N/A 5. Neuropsych/cognition: This patient is not capable of making decisions on his own behalf. 6. Skin/Wound Care: Routine pressure relief measures.   7. Fluids/Electrolytes/Nutrition:   -intake improved  -labs ordered for 7/25 8. 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 HTN: Monitor BP TID--long term goal normotensive             --continue amlodipine 10 mg/day and coreg  6.25 mg BID.  Vitals:   07/18/23 1937 07/19/23 0411  BP: 119/76 (!) 132/90  Pulse: 83 86  Resp: 18 16  Temp: 98.1 F (36.7 C) 97.6 F (36.4 C)  SpO2: 100% 98%   Keep in 130-150 range - in range; appropriate 7/20-24  10. T2DM w/peripheral neuropathy: Hgb A1c- 11.7. Was on Lantus 30  units PTA?  --Continue Insulin glargline-->was decreased to  5 units/HS today(to start tomorrow as received 10 units this am).   --Hold every 4 hours novolog as not on TF at this moment.  CBG (last 3)  Recent Labs    07/18/23 1619 07/18/23 2050 07/19/23 0645  GLUCAP 138* 166* 115*   Increased Semglee to 15U 7/7 Change SSI to moderate  7/14 increase  semglee to 22 u d/t persistent elevation 7/19 increase semglee to 25 U, consider low dose glipizide in am  7/23- elevation last night again. Add low dose glimepiride today 7/24--improved pm reading with glimepiride --obsv 11. Acute on chronic renal failure?:  Improved with IVF for hydration-->was d/c yesterday but will resume with questionable intake --.SCr 1.02 in 09/22 per chart review. SCr 1.57 @ admission-->1.29-->1.31 PO4 and Mg ok 7/7 Off IVF monitor UO--probably close to baseline   12. Severe dysphagia: Continue D1, honey liquids. Willing to take liquids-->will offer thickened Glucerna with meals for now.         13. Hyperlipidemia: LDL 265. On Lipitor 80 mg daily. 14. Severe vitamin D deficiency: On Ergocalciferol weekly with dialy low supplement.  15. ABLA: Hgb 13.1 @ admission-->11.1-->9.2. Will order stool guaiacs- none collected 7/5.                 Latest Ref Rng & Units 07/17/2023    6:42 AM 07/10/2023    6:58 AM 07/03/2023    9:23 AM  CBC  WBC 4.0 - 10.5 K/uL 6.7  7.9  9.9   Hemoglobin 13.0 - 17.0 g/dL 16.1  9.9  09.6   Hematocrit 39.0 - 52.0 % 29.8  29.9  32.4   Platelets 150 - 400 K/uL 278  338  361    Fluctuates 10-11 g/dL range--stable 0/45  16. Low grade fevers: Encourage pulmonary hygiene.  17. Hx tobacco/Marijuana use: Continue  nicotine patch.  Encourage cessation of Marijuana.  18. Intermittent episodes of N/V: Question due to gastroparesis (Multiple ED visits in the past for intractable N/V). 19. Nasal congestion- ordered Flonase 1 sprays daily.   21.  Post stroke dysphagia,improved now on D3 thin liquids  with good intake        Latest Ref Rng & Units 07/14/2023    5:59 AM 07/10/2023    6:58 AM 07/07/2023    6:35 AM  BMP  Glucose 70 - 99 mg/dL 409  811  914   BUN 6 - 20 mg/dL 19  18  15    Creatinine 0.61 - 1.24 mg/dL 7.82  9.56  2.13   Sodium 135 - 145 mmol/L 136  134  135   Potassium 3.5 - 5.1 mmol/L 4.1  4.2  4.5   Chloride 98 - 111 mmol/L 102  102  103   CO2 22 - 32 mmol/L 26  25  23    Calcium 8.9 - 10.3 mg/dL 8.8  8.9  8.8       22.  Spasticity severe elbow flexor tone, may need Botox / Xeomin prior to d/c, cont ROM    - Reminder to nursing to don WHO at nighhttime 7/20; R ankle ROM still WNL, no PRAFO in room, if not available would consider ordering this week before PF tone goes past neutral   - 7/21: D/w PT, start scheduled Baclofen 10 mg TID in addition to current Zanaflex.   7/24 persistent hypertonicity--increase baclofen to 15mg  TID  LOS: 22 days A FACE TO FACE EVALUATION WAS PERFORMED  Ranelle Oyster 07/19/2023, 8:44 AM

## 2023-07-19 NOTE — Plan of Care (Signed)
  Problem: Consults Goal: RH STROKE PATIENT EDUCATION Description: See Patient Education module for education specifics  Outcome: Progressing   Problem: RH BOWEL ELIMINATION Goal: RH STG MANAGE BOWEL WITH ASSISTANCE Description: STG Manage Bowel with mod I Assistance. Outcome: Progressing Goal: RH STG MANAGE BOWEL W/MEDICATION W/ASSISTANCE Description: STG Manage Bowel with Medication with mod I  Assistance. Outcome: Progressing   Problem: RH BLADDER ELIMINATION Goal: RH STG MANAGE BLADDER WITH ASSISTANCE Description: STG Manage Bladder With toileting Assistance Outcome: Progressing   Problem: RH SAFETY Goal: RH STG ADHERE TO SAFETY PRECAUTIONS W/ASSISTANCE/DEVICE Description: STG Adhere to Safety Precautions With cues Assistance/Device. Outcome: Progressing   Problem: RH KNOWLEDGE DEFICIT Goal: RH STG INCREASE KNOWLEDGE OF DIABETES Description: Patient and family will be able to manage DM with medications and dietary modifications using educational resources independently Outcome: Progressing Goal: RH STG INCREASE KNOWLEDGE OF HYPERTENSION Description: Patient and family will be able to manage HTN with medications and dietary modifications using educational resources independently Outcome: Progressing Goal: RH STG INCREASE KNOWLEDGE OF DYSPHAGIA/FLUID INTAKE Description: Patient and family will be able to manage Dysphagia, medications and dietary modifications using educational resources independently Outcome: Progressing Goal: RH STG INCREASE KNOWLEGDE OF HYPERLIPIDEMIA Description: Patient and family will be able to manage HLD with medications and dietary modifications using educational resources independently Outcome: Progressing Goal: RH STG INCREASE KNOWLEDGE OF STROKE PROPHYLAXIS Description: Patient and family will be able to manage secondary risks with medications and dietary modifications using educational resources independently Outcome: Progressing

## 2023-07-19 NOTE — Progress Notes (Signed)
Patient ID: Zachary Winters, male   DOB: 03/08/1981, 42 y.o.   MRN: [id number]  Team Conference Report to Patient/Family  Team Conference discussion was reviewed with the patient and caregiver, including goals, any changes in plan of care and target discharge date.  Patient and caregiver express understanding and are in agreement.  The patient has a target discharge date of 07/25/23.  Sw met with patient and spoke with sister, Zachary Winters via telephone. SW requested sister to bring in sneakers for the patient. Family plans to attend family education Saturday. Sister would like to confirm with family on the preferred timeframe, sw will follow up with sister tomorrow. Sister anticipates bringing in measurements for therapist on Saturday. SW will confirm all DME with sister. Family requesting a HB fro D/C. No additional questions or concerns.   Andria Rhein 07/19/2023, 2:10 PM

## 2023-07-19 NOTE — Progress Notes (Signed)
Physical Therapy Session Note  Patient Details  Name: Zachary Winters MRN: [id number] Date of Birth: 1981/12/16  Today's Date: 07/19/2023 PT Individual Time: 0905 - 1020 PT Individual Time Calculation (min): 75 min  PT Individual Time: 1350-1435 PT Individual Time Calculation (min): 45 min   Short Term Goals: Week 3:  PT Short Term Goal 1 (Week 3): Pt will perform bed mobility at overall ModA. PT Short Term Goal 2 (Week 3): Pt will perform sit<>stand with no AD and modA +1 PT Short Term Goal 3 (Week 3): pt will maintain sitting balance to midline with supervision +1. PT Short Term Goal 4 (Week 3): pt will perform squat pivot transfer with MinA +1  SESSION 1 Skilled Therapeutic Interventions/Progress Updates: Patient supine in bed on entrance to room. Patient alert and agreeable to PT session.   Patient reported no pain at beginning of session. Pt presented with decreased desire to participate in using HW in order to progress away from using the hand rail. Pt required increased encouragement and education to participate in rehabilitation for self improvement and decreased dependency. Pt eventually agreed to using HW.   Therapeutic Activity: Bed Mobility: Pt performed supine<>sit on EOB with min/light modA. VC required for pt to use L UE to push into bed. Transfers: Pt performed sit<>stand transfers throughout session with min/modA (modA to HW, and minA/light minA to L HR).  Gait Training:  Pt ambulated 35' x 1 using LHR with PTA on R side facilitating R LE advancement. Pt still requiring totalA on R LE to advance through swing phase, and mod/max during stance phase. MaxA to weight shift laterally, and to anteriorly shift hips during appropriate times in gait cycle.  Pt ambulated 10' x 1 with HW and with PTA on R side facilitating R LE advancement. Pt with max cues at first for [facility] and B LE sequence. Pt noted to self correct HW when advancing it too far, and problem solving if L LE is in the  way (pt on L side of hallway so a few times happened where there was not enough room for [facility] to fit through). Pt also with wide BOS, even with placement of R LE in neutral, pt favored taking a wider step with L. Pt initially cued to swing L LE straight through instead of slightly to the side, and then did not require cues after getting sequence). Pt progressed to min cues to follow sequence of HW and B LE's.   Neuromuscular Re-ed: NMR facilitated during session with focus on neuromuscular connection. - 4 x sit to stands from hi/low mat with R LE functional electrical stimulation (FES) to Quadricepts muscles using [place] e-stim device set on NMES L with trigger switch to time activation of stimulation - intensity 52 with palpable muscle contraction - after completion of e-stim, pt denies any negative side effects with skin intact and no adverse side effects noted. Pt cued to actively think about contracting R LE quads.   NMR performed for improvements in motor control and coordination, balance, sequencing, judgement, and self confidence/ efficacy in performing all aspects of mobility at highest level of independence.   Patient in [facility] at end of session with brakes locked, belt alarm set, and all needs within reach.  SESSION 2 Skilled Therapeutic Interventions/Progress Updates: Patient in [facility] on entrance to room. Patient alert and agreeable to PT session.   Patient reported no pain, but reported feeling fatigued at beginning of session.   Therapeutic Activity: Bed Mobility: Pt performed  sit to supine from EOB with modA to advance R LE onto bed. Transfers: Pt performed squat pivot transfers to R with modA and required increased time and effort due to reported fatigue. Pt performed squat pivot to L following NMES and noted to perform transfer with minA. Pt required min cues throughout to recall sequence of transfer.   Neuromuscular Re-ed: NMR facilitated during session with focus on neuromuscular  activation. Applied R LE functional electrical stimulation (FES) to Quadricep muscles during gait training using [place] e-stim device set on NMES L with trigger switch to time activation of stimulation - intensity 52 with palpable muscle contraction - after completion of e-stim, pt denies any negative side effects with skin intact and no adverse side effects noted.        - Sit to stands with cues for pt to stand during rise time of e-stim, and  to control descent with L LE and B quadriceps. Pt required modA to stand, and cues to contract R quads while e-stim is on.       - Progressed to having 2" block underneath L LE to increase WB power through on R LE with same cues throughout. Pt initially required modA to stand, and then progressed to min/modA with same cues to contract quads. Pt with lean on R onto PTA, and PTA giving modA at first for pt to extend R Knee into stance, then progressed to light minA.   NMR performed for improvements in motor control and coordination, balance, sequencing, judgement, and self confidence/ efficacy in performing all aspects of mobility at highest level of independence.   Patient supine in bed at end of session with brakes locked, belt alarm set, and all needs within reach.       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 Restrictions Weight Bearing Restrictions: No  Therapy/Group: Individual Therapy  Keevon Henney PTA 07/19/2023, 3:36 PM

## 2023-07-19 NOTE — Progress Notes (Signed)
Occupational Therapy Session Note  Patient Details  Name: Zachary Winters MRN: [id number] Date of Birth: 1981-04-05  Today's Date: 07/19/2023 OT Individual Time: 1140-1205 OT Individual Time Calculation (min): 25 min    Short Term Goals: Week 1:  OT Short Term Goal 1 (Week 1): pt will be able to hold static sit balance with CGA to enable him to sit safely on a BSC. OT Short Term Goal 1 - Progress (Week 1): Met OT Short Term Goal 2 (Week 1): Pt will be able to sit to stand in a stedy lift with mod A to enable a safe transfer to a BSC. OT Short Term Goal 2 - Progress (Week 1): Met OT Short Term Goal 3 (Week 1): Pt will demonstrate improved R side attention by completing RUE self ROM to prevent contractures due to hypertone, OT Short Term Goal 3 - Progress (Week 1): Progressing toward goal OT Short Term Goal 4 (Week 1): Pt will sit to stand with max A to enable care givers to pull clothing up over hips. OT Short Term Goal 4 - Progress (Week 1): Met Week 2:  OT Short Term Goal 1 (Week 2): pt will complete squat pivot transfers with MOD A +2  as precursor to safe toilet transfers OT Short Term Goal 1 - Progress (Week 2): Met OT Short Term Goal 2 (Week 2): pt will complete sit>stands with MODA +2 to improve pts ability to sit>stand for LB dressing OT Short Term Goal 2 - Progress (Week 2): Met OT Short Term Goal 3 (Week 2): pt will sit EOB with CGA for > 5 mins as precursor to higher level ADLs from EOB OT Short Term Goal 3 - Progress (Week 2): Met Week 3:  OT Short Term Goal 1 (Week 3): STGs= LTGs  Skilled Therapeutic Interventions/Progress Updates:    Pt received in wc and agreeable to practicing squat pivot transfer to drop arm BSC with +2 A.   BSC set up adjacent to the wall so he could move to it to his L side.  Unable to have a flush seat to seat as leg rests of commode and wheels of wc in the way.  Reviewed with pt the movement he needs to do, but once transfer started he stood up and  tried to pivot vs squatting. His L foot caught underneath him and it was going to be an unsafe transfer.  Had pt sit down and tried again,  due to space between seats pt needed a heavy mod A of 2.  Same amount of A to transfer back to w/c.  Discussed strategies of foot placement with L foot but pt has difficulty motor planning and following through.  Suggested he keep practicing this transfer to see if this is something his family could manage at home.  Family may also want to consider buying a stedy lift as he has been doing well using that with nursing staff.   Pt resting in w/c with all needs met. Alarm set and call light in reach.    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 Restrictions Weight Bearing Restrictions: No   Pain:  No c/o pain ADL: ADL Eating: Supervision/safety Grooming: Minimal cueing, Minimal assistance Where Assessed-Grooming: Sitting at sink Upper Body Bathing: Supervision/safety (using long sponge) Where Assessed-Upper Body Bathing: Shower Lower Body Bathing: Minimal assistance (Assist for buttocks only, used long sponge for feet) Where Assessed-Lower Body Bathing: Water quality scientist  Dressing: Minimal assistance, Moderate cueing Where Assessed-Upper Body Dressing: Wheelchair Lower Body Dressing: Maximal assistance Where Assessed-Lower Body Dressing: Wheelchair Toileting: Dependent Where Assessed-Toileting: Bedside Commode, Toilet Toilet Transfer: Moderate assistance Toilet Transfer Method: Ambulance person: Psychiatric nurse: Moderate assistance, Moderate cueing Film/video editor Method: Administrator: Emergency planning/management officer, Grab bars   Therapy/Group: Individual Therapy  Zachary Winters 07/19/2023, 8:26 AM

## 2023-07-19 NOTE — Patient Care Conference (Signed)
Inpatient RehabilitationTeam Conference and Plan of Care Update Date: 07/19/2023   Time: 10:07 AM    Patient Name: Zachary Winters      Medical Record Number: [id number]  Date of Birth: 1981/09/18 Sex: Male         Room/Bed: 4W13C/4W13C-01 Payor Info: Payor: [place] MEDICAID PREPAID HEALTH PLAN / Plan: [place] MEDICAID HEALTHY BLUE / Product Type: *No Product type* /    Admit Date/Time:  06/27/2023  4:33 PM  Primary Diagnosis:  Acute ischemic left middle cerebral artery (MCA) stroke [facility])  Hospital Problems: Principal Problem:   Acute ischemic left middle cerebral artery (MCA) stroke (HCC) Active Problems:   Hypertension associated with diabetes (HCC)   Type 2 diabetes mellitus with diabetic neuropathy (HCC)   Hyperlipidemia associated with type 2 diabetes mellitus (HCC)   Aphasia   Apraxia   H/O nausea and vomiting    Expected Discharge Date: Expected Discharge Date: 07/25/23  Team Members Present: Physician leading conference: Dr. Fanny Dance Social Worker Present: Lavera Guise, BSW Nurse Present: Chana Bode, RN PT Present: Ralph Leyden, PT OT Present: Primitivo Gauze, OT SLP Present: Other (comment) Alvera Novel, SLP) PPS Coordinator present : Fae Pippin, SLP     Current Status/Progress Goal Weekly Team Focus  Bowel/Bladder   Patient is incontient of bladder and bowel, LBM 07/14/23, Need stool for Occult   Regain contience   Assist and assess time toileting QS/ Q2 hrs and prn    Swallow/Nutrition/ Hydration   Tolerating Dys 3 and thin liquid with min A cues for oral clearance and small single boluses. Continue with no straws   Sup a  awareness of residue, clearing residue independently, independence with swallowing strategies    ADL's   pt continues to put in good effort in therapy sessions, excellent progress with postural control which makes him safer when sitting on tub bench and BSC.  mod A UB self care, max LB bathing and dressing, total A toileting, heavy  mod A squat pivot to his right and min A to his left   MIN- MOD overall with static stand, sit to stand, squat pivot and self care except for max A with toileting   functional transfers, sitting balance, BADL reeducation, RUE PROM/ NMR, family education    Mobility   bed mobility = Min/ ModA for rolling, Mod/ MaxA for supine<>sit ; squat pivot to L = ModA with significant cues, sit<>stand = MinA with LUE support, MaxA with no support and reaching out for nearest furniture to steady self, ambulation = MaxA, now using HW with increasing understanding of balance bias to L side   CGA/ MinA for bed mobility and transfers, ModA for ambulation - need downgrading  Barriers: LOA family can provide in functional transfers, R hemibody flexor tone, impulsivity /// work on: sitting/ standing balance, improving bed mobility, transfer LOA, establish w/c eval and AFO consult, NMES for R quad, NMR for R hemibody, family education    Communication   mod to max a in known context with single word utterances   mod A   communication board, functional naming, work on stimulable sounds, non verbal communication    Safety/Cognition/ Behavioral Observations  unable to assess due to severity of aphasia/ apraxia            Pain   No complaint pain, occassional discomfort headache, has Tylenol prn  ,   remain pain free   Continue to assess Pain QS/PRN    Skin   Skin is intact,  free of breakdown   Maintain skin intergity  Skin assessment QS/PRN ,report any new changes in skin integrity      Discharge Planning:  Patient discharging home with sister, step father and family to assist. Additional family education to be arranged. DME pending.   Team Discussion: Patient with increased tone, continued apraxia and aphasia, with improved postural control  is limited by labile mood and decreased participation.  Patient on target to meet rehab goals: no,   *See Care Plan and progress notes for long and short-term  goals.   Revisions to Treatment Plan:  Downgraded goals to MOD A Change goal to decrease burden of care AFO consult   Teaching Needs: Safety, medications/insulin administration, dietary modification, squat pivot  transfers, toileting, etc.  Current Barriers to Discharge: Decreased caregiver support and Home enviroment access/layout  Possible Resolutions to Barriers: Family education  HH follow up services DME: Drop arm [facility]     Medical Summary Current Status: pt's po intake better. language more spontaneous. spasticity still significant. sugars inconsistent  Barriers to Discharge: Medical stability;Spasticity;Uncontrolled Diabetes   Possible Resolutions to [employer] Focus: diabetes regimen adjustment, titrating baclofen. daily assessment of vs and pt data   Continued Need for Acute Rehabilitation Level of Care: The patient requires daily medical management by a physician with specialized training in physical medicine and rehabilitation for the following reasons: Direction of a multidisciplinary physical rehabilitation program to maximize functional independence : Yes Medical management of patient stability for increased activity during participation in an intensive rehabilitation regime.: Yes Analysis of laboratory values and/or radiology reports with any subsequent need for medication adjustment and/or medical intervention. : Yes   I attest that I was present, lead the team conference, and concur with the assessment and plan of the team.   Chana Bode B 07/19/2023, 1:29 PM

## 2023-07-19 NOTE — Progress Notes (Signed)
Nutrition Follow-up  DOCUMENTATION CODES:   Not applicable  INTERVENTION:  - Continue Ensure Enlive po BID, each supplement provides 350 kcal and 20 grams of protein.  NUTRITION DIAGNOSIS:   Inadequate oral intake related to lethargy/confusion, dysphagia as evidenced by meal completion < 25%.  GOAL:   Patient will meet greater than or equal to 90% of their needs - Met.   MONITOR:   PO intake  REASON FOR ASSESSMENT:   New TF Calorie Count  ASSESSMENT:   42 y.o. 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 PMH includes: DM.  Meds reviewed: lipitor, sliding scale insulin, semglee, MVI, senokot, Vit D. Labs reviewed: Creatinine elevated.   Pt continues with good PO intakes. Per record, pt has eaten 75-100% of his meals over the past 7 days. Pt is meeting his needs at this time. RD will continue to monitor PO intakes.   Diet Order:   Diet Order             DIET DYS 3 Room service appropriate? Yes; Fluid consistency: Thin  Diet effective 0500                   EDUCATION NEEDS:   Not appropriate for education at this time  Skin:  Skin Assessment: Reviewed RN Assessment  Last BM:  7/22 - type 6  Height:   Ht Readings from Last 1 Encounters:  06/27/23 6\' 1"  (1.854 m)    Weight:   Wt Readings from Last 1 Encounters:  07/19/23 81.4 kg    Ideal Body Weight:     BMI:  Body mass index is 23.68 kg/m.  Estimated Nutritional Needs:   Kcal:  2130-2550 kcals  Protein:  105-125 gm  Fluid:  2.1-2.5 L  Bethann Humble, RD, LDN, CNSC.

## 2023-07-19 NOTE — Progress Notes (Signed)
Patient ID: Zachary Winters, male   DOB: [birth year], 42 y.o.   MRN: [id number]  Drop arm commode ordered through [employer].

## 2023-07-19 NOTE — Progress Notes (Signed)
Speech Language Pathology Weekly Progress and Session Note  Patient Details  Name: Zachary Winters MRN: [id number] Date of Birth: 10/27/1981  Beginning of progress report period: July 12, 2023 End of progress report period: July 19, 2023  Today's Date: 07/19/2023 SLP Individual Time: 0801-0900 SLP Individual Time Calculation (min): 59 min  Short Term Goals: Week 3: SLP Short Term Goal 1 (Week 3): Pt will tolerate Dys 3 diet and thin liquids with minimal s/s of aspiration and no indications of development of aspiration pneumonia SLP Short Term Goal 1 - Progress (Week 3): Met SLP Short Term Goal 2 (Week 3): Pt will follow simple one step directions with supervision visual/verbal cues SLP Short Term Goal 2 - Progress (Week 3): Progressing toward goal SLP Short Term Goal 3 (Week 3): Pt will complete functional naming tasks (automatic responses, responsive naming, simple confrontational naming, etc) with 80% accuracy given modA visual/verbal cues SLP Short Term Goal 3 - Progress (Week 3): Met SLP Short Term Goal 4 (Week 3): Pt will utilize multimodal communication to express simple wants/needs with modA visual/verbal cues SLP Short Term Goal 4 - Progress (Week 3): Met SLP Short Term Goal 5 (Week 3): Pt will consistently utilize safe swallow strategies during PO intake w/ supervision cues. SLP Short Term Goal 5 - Progress (Week 3): Progressing toward goal    New Short Term Goals: Week 4: SLP Short Term Goal 1 (Week 4): Pt will follow simple one step directions with supervision visual/verbal cues SLP Short Term Goal 2 (Week 4): Pt will consistently utilize safe swallow strategies during PO intake w/ supervision cues. SLP Short Term Goal 3 (Week 4): Pt will utilize multimodal communication to express simple wants/needs with min A visual/verbal cues SLP Short Term Goal 4 (Week 4): Pt will improve speech intelligibility of single words to 75% with min A verbal and visual cues SLP Short Term Goal 5  (Week 4): Pt will complete functional naming tasks (automatic responses, responsive naming, simple confrontational naming, etc) with 75% accuracy given min A visual/verbal cues  Weekly Progress Updates: Pt has made great progress and has met 3 of 5 STG's this reporting period due to tolerance of Dys 3 diet and thin liquids, naming, and use of multimodal communication. He also made progress toward remaining two goals for following directions and use of safe swallowing strategies. Pt is currently an overall min A for swallowing strategies, mod A for naming, and mod to max A for speech intelligibility. Pt/ family education ongoing. Pt would benefit from continued skilled SLP intervention to maximize language, intelligibility, and swallowing in order to maximize his functional independence prior to discharge.   Intensity: Minumum of 1-2 x/day, 30 to 90 minutes Frequency: 3 to 5 out of 7 days Duration/Length of Stay: ~ July 30 Treatment/Interventions: Speech/Language facilitation;Therapeutic Activities;Cueing hierarchy;Therapeutic Exercise;Dysphagia/aspiration precaution training;Patient/family education   Daily Session  Skilled Therapeutic Interventions: Pt was seen in am to address language, speech, and dysphagia management. Pt was alerted upon SLP arrival and agreeable for session. He was agreeable to SLP completion of thorough oral hygiene. He was presented with current diet of Dys 3 and thin liquids. SLP provided set up A and pt administered trials. Pt required min A for awareness of lingual residue and completion of oral clearance. At conclusion of meal pt able to ensure complete oral clearance and check R buccal cavity with sup A. In additional minutes of session, SLP addressing naming, intelligibility, and multimodal communication. Pt was stimulable for /b/ /n/ /f/  and /l/ in initial position of words this date. Pt producing /b/ in initial position of words with sup to min A this date. He named  objects within room with 67% acc with mod A. Pt spontaneously produced phrases, "I know" "I don't know" "how are you?" during session. Given a FO2 pt located objects on drink board with 67% acc with min A     Pain Pain Assessment Pain Scale: 0-10 Pain Score: 0-No pain  Therapy/Group: Individual Therapy  Zachary Winters 07/19/2023, 11:59 AM

## 2023-07-20 LAB — BASIC METABOLIC PANEL
Anion gap: 7 (ref 5–15)
BUN: 22 mg/dL — ABNORMAL HIGH (ref 6–20)
CO2: 26 mmol/L (ref 22–32)
Calcium: 9.1 mg/dL (ref 8.9–10.3)
Chloride: 105 mmol/L (ref 98–111)
Creatinine, Ser: 1.6 mg/dL — ABNORMAL HIGH (ref 0.61–1.24)
GFR, Estimated: 56 mL/min — ABNORMAL LOW (ref >60)
Potassium: 4.1 mmol/L (ref 3.5–5.1)
Sodium: 138 mmol/L (ref 135–145)

## 2023-07-20 LAB — GLUCOSE, CAPILLARY
Glucose-Capillary: 85 mg/dL (ref 70–99)
Glucose-Capillary: 131 mg/dL — ABNORMAL HIGH (ref 70–99)
Glucose-Capillary: 166 mg/dL — ABNORMAL HIGH (ref 70–99)
Glucose-Capillary: 295 mg/dL — ABNORMAL HIGH (ref 70–99)

## 2023-07-20 MED ORDER — INSULIN GLARGINE-YFGN 100 UNIT/ML ~~LOC~~ SOLN
18.0000 [IU] | Freq: Every day | SUBCUTANEOUS | Status: DC
  Administered 2023-07-20 – 2023-07-24 (×5): 18 [IU] via SUBCUTANEOUS
  Filled 2023-07-20 (×6): qty 0.18

## 2023-07-20 MED ORDER — GLIMEPIRIDE 2 MG PO TABS
2.0000 mg | ORAL_TABLET | Freq: Every day | ORAL | Status: DC
  Administered 2023-07-21 – 2023-07-25 (×5): 2 mg via ORAL
  Filled 2023-07-20 (×5): qty 1

## 2023-07-20 MED ORDER — GLIMEPIRIDE 1 MG PO TABS
1.0000 mg | ORAL_TABLET | Freq: Once | ORAL | Status: AC
  Administered 2023-07-20: 1 mg via ORAL
  Filled 2023-07-20: qty 1

## 2023-07-20 NOTE — Progress Notes (Signed)
Physical Therapy Session Note  Patient Details  Name: Zachary Winters MRN: [id number] Date of Birth: 1981/05/21  Today's Date: 07/20/2023 PT Individual Time: 0920-1030 PT Individual Time Calculation (min): 70 min   Short Term Goals: Week 3:  PT Short Term Goal 1 (Week 3): Pt will perform bed mobility at overall ModA. PT Short Term Goal 2 (Week 3): Pt will perform sit<>stand with no AD and modA +1 PT Short Term Goal 3 (Week 3): pt will maintain sitting balance to midline with supervision +1. PT Short Term Goal 4 (Week 3): pt will perform squat pivot transfer with MinA +1  Skilled Therapeutic Interventions/Progress Updates: Patient supine in bed on entrance to room. Patient initially adamant on no participating in PT this morning, and required extensive encouragement and motivation to rehab self. PTA educated patient that having family do most of the work after d/c will put a lot of strain on them (pt reported not wanting to do anything and wanting family/others to do the work). Pt eventually agreed to participate in PT after increased efforts and education/consoling.   Therapeutic Activity: Bed Mobility: Pt performed bridge on R side to assist in donning personal shorts with rehab tech finishing donning on affected side. Pt then cued to get sitting to EOB (not cues from PTA on sequence - HOB flat). Pt supine to R sidelying with CGA, and touch assist to advance paretic LE (R) with cue to use L LE to push R LE off bed. Pt attempted to use L UE on rail to push off, but was unable. Pt then cued to push into bed, but was adamant on not performing cued step, and instead reached out to tech to assist in pulling self up. Once pt sitting EOB, pt educated that pt needs to perform everything that he can in order to progress. Transfers: Pt performed squat pivot transfer to L and R with min/modA, and min cues for set up.   Gait Training:  Pt ambulated 42' using HW with PTA on R side facilitating R LE  advancement. Pt required increased effort and cues to perform sequence with HW and steps, and totalA to advance R LE (noted to have increased flexor tone and required maxA to place R LE into stance phase). Pt also with wide steppage to L with L LE, and require max cues to step in neutral BOS. Further ambulation deferred 2/2 safety.   Neuromuscular Re-ed: NMR facilitated during session with focus on neuromuscular activation. - Applied R LE functional electrical stimulation (FES) to Quadricep muscles during gait training using [place] e-stim device set on NMES L with trigger switch to time activation of stimulation - intensity 54 with palpable muscle contraction - after completion of e-stim, pt denies any negative side effects with skin intact and no adverse side effects noted.        - Sit to stands with cues to stand when e-stim turns on, and  to control descent with L LE and B quadriceps. Pt required modA to stand, and cues to contract R quads while e-stim is on. Pt also with 2" step on L LE to increase WB on R LE.       - Pt then with no block underneath L LE, and required min/modA to stand with PTA on R side. Pt performed lateral weight shifts with cues to think about extending R knee when shifting to the R with modA to prevent buckling.   NMR performed for improvements in motor control and coordination,  balance, sequencing, judgement, and self confidence/ efficacy in performing all aspects of mobility at highest level of independence.   Patient in [facility] at end of session with brakes locked, belt alarm set, and all needs within reach.      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

## 2023-07-20 NOTE — Progress Notes (Addendum)
Patient ID: Zachary Winters, male   DOB: 20-Apr-1981, 42 y.o.   MRN: [id number]  [facility] referral sent to [facility].  Patient declined by [place].  HH referral sent to [facility] Patient approved, orders sent.

## 2023-07-20 NOTE — Progress Notes (Signed)
Occupational Therapy Session Note  Patient Details  Name: Zachary Winters MRN: [id number] Date of Birth: 1981-07-08  Today's Date: 07/20/2023 OT Individual Time: 1045-1200 OT Individual Time Calculation (min): 75 min    Short Term Goals: Week 1:  OT Short Term Goal 1 (Week 1): pt will be able to hold static sit balance with CGA to enable him to sit safely on a BSC. OT Short Term Goal 1 - Progress (Week 1): Met OT Short Term Goal 2 (Week 1): Pt will be able to sit to stand in a stedy lift with mod A to enable a safe transfer to a BSC. OT Short Term Goal 2 - Progress (Week 1): Met OT Short Term Goal 3 (Week 1): Pt will demonstrate improved R side attention by completing RUE self ROM to prevent contractures Winters to hypertone, OT Short Term Goal 3 - Progress (Week 1): Progressing toward goal OT Short Term Goal 4 (Week 1): Pt will sit to stand with max A to enable care givers to pull clothing up over hips. OT Short Term Goal 4 - Progress (Week 1): Met Week 2:  OT Short Term Goal 1 (Week 2): pt will complete squat pivot transfers with MOD A +2  as precursor to safe toilet transfers OT Short Term Goal 1 - Progress (Week 2): Met OT Short Term Goal 2 (Week 2): pt will complete sit>stands with MODA +2 to improve pts ability to sit>stand for LB dressing OT Short Term Goal 2 - Progress (Week 2): Met OT Short Term Goal 3 (Week 2): pt will sit EOB with CGA for > 5 mins as precursor to higher level ADLs from EOB OT Short Term Goal 3 - Progress (Week 2): Met Week 3:  OT Short Term Goal 1 (Week 3): STGs= LTGs  Skilled Therapeutic Interventions/Progress Updates:    Pt received in w/c ready for therapy.  Pt taken to gym to focus on dynamic sit balance, RUE weight bearing, transfers and sit to stands.  Pt initially shaking head no and declining much of the therapy intervention and needed mod cues and encouragement for full participation.   Mod A with occasional min A repetitive squat pivot transfers w/c to  mat 10x with cues for keeping hips elevated for full transfer, head forward and gaze direction.  Sit to stands from mat 4x and later from wc at parallel bars.  Had pt place L hand crossed over midline to force more use of RLE. This did help him to rise with min - mod A but then had difficulty holding balance. Needs mod - max A to stay in midline and support R LE as it pulls into flexor tone.   Stood 7 x total this session with holding stands for up to 4 minutes.  Cues to keep head and chest lifted.    Seated dynamic reaching for lateral weight shifts for torso stability with reaching for targets.  Improved R side visual field awareness.  R hand weight bearing on mat with forward reaching with L hand.    +2 A this session from rehab tech.  Some of pt's vocalizations were more clear such as "I dont know how" or " I dont want to"  Pt returned to room with all needs met and alarm set.    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 Restrictions Weight Bearing Restrictions: No   Pain: Pain Assessment Pain Score: 0-No pain ADL: ADL Eating:  Supervision/safety Grooming: Minimal cueing, Minimal assistance Where Assessed-Grooming: Sitting at sink Upper Body Bathing: Supervision/safety (using long sponge) Where Assessed-Upper Body Bathing: Shower Lower Body Bathing: Minimal assistance (Assist for buttocks only, used long sponge for feet) Where Assessed-Lower Body Bathing: Shower Upper Body Dressing: Minimal assistance, Moderate cueing Where Assessed-Upper Body Dressing: Wheelchair Lower Body Dressing: Maximal assistance Where Assessed-Lower Body Dressing: Wheelchair Toileting: Dependent Where Assessed-Toileting: Bedside Commode, Actuary Transfer: Moderate assistance Toilet Transfer Method: Ambulance person: Psychiatric nurse: Moderate  assistance, Moderate cueing Film/video editor Method: Administrator: Emergency planning/management officer, Grab bars     Therapy/Group: Individual Therapy  Demarr Kluever 07/20/2023, 12:52 PM

## 2023-07-20 NOTE — Progress Notes (Signed)
Speech Language Pathology Daily Session Note  Patient Details  Name: Zachary Winters MRN: [id number] Date of Birth: 1981-05-21  Today's Date: 07/20/2023 SLP Individual Time: 1300-1400 SLP Individual Time Calculation (min): 60 min  Short Term Goals: Week 4: SLP Short Term Goal 1 (Week 4): Pt will follow simple one step directions with supervision visual/verbal cues SLP Short Term Goal 2 (Week 4): Pt will consistently utilize safe swallow strategies during PO intake w/ supervision cues. SLP Short Term Goal 3 (Week 4): Pt will utilize multimodal communication to express simple wants/needs with min A visual/verbal cues SLP Short Term Goal 4 (Week 4): Pt will improve speech intelligibility of single words to 75% with min A verbal and visual cues SLP Short Term Goal 5 (Week 4): Pt will complete functional naming tasks (automatic responses, responsive naming, simple confrontational naming, etc) with 75% accuracy given min A visual/verbal cues  Skilled Therapeutic Interventions: Skilled therapy session focused on cognitive and communication goals. SLP facilitated session by providing mod A cues to facilitate functional communication. Patient was presented with functional picture cards and was able to name items with 50% accuracy given mod A. Patient able to finish simple phrases in 9/12 opportunities given min phonemic cues. To address speech intelligibility, SLP provided mod visual and tactile cues to produce the initial phoneme in single words. Patient able to articulate /b/ in 50% of opportunities using these cues. SLP trialed use of simple AAC board and patient was able to point to yes/no and TV independently when prompted, however not able to answer questions with use of the board. At the end of the session, SLP aided patient in completion of oral care. Patient left in bed with alarm set and call bell in reach. Continue POC.   Pain Pain Assessment Pain Score: 0-No pain  Therapy/Group: Individual  Therapy  Shaton Lore M.A., CF-SLP  07/20/2023, 2:39 PM

## 2023-07-20 NOTE — Progress Notes (Signed)
Patient ID: Zachary Winters, male   DOB: 1981-10-30, 42 y.o.   MRN: [id number]  Family education arranged with sister and family for Saturday 7/27 9a- 12p.

## 2023-07-20 NOTE — Progress Notes (Signed)
PROGRESS NOTE   Subjective/Complaints:  No problems overnight. Patient appears a little lethargic this morning. Says he slept. Seemed to imply that he didn't feel tired yesterday during the day after increasing baclofen  ROS: limited due to language/communication    Objective:   No results found. No results for input(s): "WBC", "HGB", "HCT", "PLT" in the last 72 hours.    Recent Labs    07/20/23 0718  NA 138  K 4.1  CL 105  CO2 26  GLUCOSE 97  BUN 22*  CREATININE 1.60*  CALCIUM 9.1      Intake/Output Summary (Last 24 hours) at 07/20/2023 0909 Last data filed at 07/20/2023 0700 Gross per 24 hour  Intake 698 ml  Output --  Net 698 ml        Physical Exam: Vital Signs Blood pressure 134/84, pulse 82, temperature 97.7 F (36.5 C), resp. rate 18, height 6\' 1"  (1.854 m), weight 82.4 kg, SpO2 99%.   Constitutional: No distress . Vital signs reviewed. HEENT: NCAT, EOMI, oral membranes moist Neck: supple Cardiovascular: RRR without murmur. No JVD    Respiratory/Chest: CTA Bilaterally without wheezes or rales. Normal effort    GI/Abdomen: BS +, non-tender, non-distended Ext: no clubbing, cyanosis, or edema Psych: pleasant and cooperative  Skin: No evidence of breakdown, no evidence of rash  Neurologic:  + Aphasic + dysarthric, responds with one word/short phrases with extra time--less speech today.   Cranial nerves II through XII intact, motor strength is 5/5 in left and 0/5 right  deltoid, bicep, tricep, grip, hip flexor, knee extensors, ankle dorsiflexor and plantar flexor MAS 1-2 R elbow, 2 @ knee, and hip--was much looser this morning.Sensory exam endorsing slightly reduced sensation diffusely on the right Musculoskeletal: other than spasticity no joint related problems. No joint swelling  Assessment/Plan: 1. Functional deficits which require 3+ hours per day of interdisciplinary therapy in a  comprehensive inpatient rehab setting. Physiatrist is providing close team supervision and 24 hour management of active medical problems listed below. Physiatrist and rehab team continue to assess barriers to discharge/monitor patient progress toward functional and medical goals  Care Tool:  Bathing        Body parts bathed by helper: Right arm, Left arm, Chest, Abdomen, Front perineal area, Buttocks, Right upper leg, Left upper leg, Face, Left lower leg, Right lower leg     Bathing assist Assist Level: Dependent - Patient 0%     Upper Body Dressing/Undressing Upper body dressing   What is the patient wearing?: Pull over shirt    Upper body assist Assist Level: Total Assistance - Patient < 25%    Lower Body Dressing/Undressing Lower body dressing      What is the patient wearing?: Underwear/pull up, Incontinence brief     Lower body assist Assist for lower body dressing: Dependent - Patient 0%     Toileting Toileting    Toileting assist Assist for toileting: Dependent - Patient 0%     Transfers Chair/bed transfer  Transfers assist  Chair/bed transfer activity did not occur: Safety/medical concerns  Chair/bed transfer assist level: Dependent - mechanical lift (STEDY)     Locomotion Ambulation   Ambulation assist  Assist level: Maximal Assistance - Patient 25 - 49% Assistive device: Other (comment) (L hallway handrail) Max distance: 35 ft   Walk 10 feet activity   Assist     Assist level: Maximal Assistance - Patient 25 - 49% Assistive device: Other (comment) (L hallway hand rail)   Walk 50 feet activity   Assist Walk 50 feet with 2 turns activity did not occur: Safety/medical concerns         Walk 150 feet activity   Assist Walk 150 feet activity did not occur: Safety/medical concerns         Walk 10 feet on uneven surface  activity   Assist Walk 10 feet on uneven surfaces activity did not occur: Safety/medical  concerns         Wheelchair     Assist Is the patient using a wheelchair?: Yes Type of Wheelchair: Manual    Wheelchair assist level: Dependent - Patient 0% Max wheelchair distance: 150 ft    Wheelchair 50 feet with 2 turns activity    Assist        Assist Level: Dependent - Patient 0%   Wheelchair 150 feet activity     Assist      Assist Level: Dependent - Patient 0%   Blood pressure 134/84, pulse 82, temperature 97.7 F (36.5 C), resp. rate 18, height 6\' 1"  (1.854 m), weight 82.4 kg, SpO2 99%.  Medical Problem List and Plan: 1. 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 Severe Left M1 and V4 stenosis              -patient may  shower             -ELOS/Goals: 7/30 min A for PT, OT and SLP- family training on Sat    -Continue CIR therapies including PT, OT, and SLP     2.  Antithrombotics: -DVT/anticoagulation:  Pharmaceutical: Lovenox, LE doppler neg              -antiplatelet therapy: DAPT for at leasat  3 months. Has declined stenting.   3. Pain Management: Tylenol prn.  4. Mood/Behavior/Sleep: LCSW to follow   for evaluation and support when appropriate.              -antipsychotic agents: N/A 5. Neuropsych/cognition: This patient is not capable of making decisions on his own behalf. 6. Skin/Wound Care: Routine pressure relief measures.   7. Fluids/Electrolytes/Nutrition:   -intake improved  -he's a little dry on labs 7/25---push fluids 8. 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 HTN: Monitor BP TID--long term goal normotensive             --continue amlodipine 10 mg/day and coreg  6.25 mg BID.  Vitals:   07/19/23 1940 07/20/23 0417  BP: 118/79 134/84  Pulse: 86 82  Resp:  18  Temp: 98.4 F (36.9 C) 97.7 F (36.5 C)  SpO2: 100% 99%   Keep in 130-150 range - in range; appropriate  7/25  10. T2DM w/peripheral neuropathy: Hgb A1c- 11.7. Was on Lantus 30 units PTA?  --Continue Insulin glargline-->was decreased to  5 units/HS today(to start tomorrow as received 10 units this am).   --Hold every 4 hours novolog as not on TF at this moment.  CBG (last 3)  Recent Labs    07/19/23 1638 07/19/23 2044 07/20/23 0638  GLUCAP 131* 159* 85   Increased  Semglee to 15U 7/7 Change SSI to moderate  7/14 increase semglee to 22 u d/t persistent elevation 7/19 increase semglee to 25 U, consider low dose glipizide in am  7/23- elevation last night again. Add low dose glimepiride today 7/25 improving control with amaryl---cbg low this morning  -will reduce semglee to 18u tonight  -increase amaryl to 2mg  daily 11. Acute on chronic renal failure?:  Improved with IVF for hydration-->was d/c yesterday but will resume with questionable intake --.SCr 1.02 in 09/22 per chart review. SCr 1.57 @ admission-->1.29-->1.31 PO4 and Mg ok 7/7 Off IVF monitor UO--probably close to baseline  7/25--he's a little dry today--push fluids 12. Severe dysphagia: Continue D1, honey liquids. Willing to take liquids-->will offer thickened Glucerna with meals for now.         13. Hyperlipidemia: LDL 265. On Lipitor 80 mg daily. 14. Severe vitamin D deficiency: On Ergocalciferol weekly with dialy low supplement.  15. ABLA: Hgb 13.1 @ admission-->11.1-->9.2. Will order stool guaiacs- none collected 7/5.                 Latest Ref Rng & Units 07/17/2023    6:42 AM 07/10/2023    6:58 AM 07/03/2023    9:23 AM  CBC  WBC 4.0 - 10.5 K/uL 6.7  7.9  9.9   Hemoglobin 13.0 - 17.0 g/dL 24.4  9.9  01.0   Hematocrit 39.0 - 52.0 % 29.8  29.9  32.4   Platelets 150 - 400 K/uL 278  338  361    Fluctuates 10-11 g/dL range--stable 2/72  16. Low grade fevers: Encourage pulmonary hygiene.  17. Hx tobacco/Marijuana use: Continue  nicotine patch.  Encourage cessation of Marijuana.  18. Intermittent episodes of N/V: Question  due to gastroparesis (Multiple ED visits in the past for intractable N/V). 19. Nasal congestion- ordered Flonase 1 sprays daily.   21.  Post stroke dysphagia,improved now on D3 thin liquids with good intake        Latest Ref Rng & Units 07/20/2023    7:18 AM 07/14/2023    5:59 AM 07/10/2023    6:58 AM  BMP  Glucose 70 - 99 mg/dL 97  536  644   BUN 6 - 20 mg/dL 22  19  18    Creatinine 0.61 - 1.24 mg/dL 0.34  7.42  5.95   Sodium 135 - 145 mmol/L 138  136  134   Potassium 3.5 - 5.1 mmol/L 4.1  4.1  4.2   Chloride 98 - 111 mmol/L 105  102  102   CO2 22 - 32 mmol/L 26  26  25    Calcium 8.9 - 10.3 mg/dL 9.1  8.8  8.9       22.  Spasticity severe elbow flexor tone, may need Botox / Xeomin prior to d/c, cont ROM    - Reminder to nursing to don WHO at nighhttime 7/20; R ankle ROM still WNL, no PRAFO in room, if not available would consider ordering this week before PF tone goes past neutral   - 7/21: D/w PT, start scheduled Baclofen 10 mg TID in addition to current Zanaflex.   7/25 persistent hypertonicity--increase baclofen to 15mg  TID yesterday   -might causing a little somnolence--watch how he does today   -his spasticity was definitely better this morning  LOS: 23 days A FACE TO FACE EVALUATION WAS PERFORMED  Ranelle Oyster 07/20/2023, 9:09 AM

## 2023-07-21 LAB — GLUCOSE, CAPILLARY
Glucose-Capillary: 134 mg/dL — ABNORMAL HIGH (ref 70–99)
Glucose-Capillary: 224 mg/dL — ABNORMAL HIGH (ref 70–99)
Glucose-Capillary: 138 mg/dL — ABNORMAL HIGH (ref 70–99)
Glucose-Capillary: 325 mg/dL — ABNORMAL HIGH (ref 70–99)
Glucose-Capillary: 333 mg/dL — ABNORMAL HIGH (ref 70–99)

## 2023-07-21 NOTE — Progress Notes (Signed)
Physical Therapy Session Note  Patient Details  Name: Zachary Winters MRN: [id number] Date of Birth: 24-Dec-1981  Today's Date: 07/21/2023 PT Individual Time: 1300-1358 PT Individual Time Calculation (min): 58 min   Short Term Goals: Week 2:  PT Short Term Goal 1 (Week 2): Pt will perform bed mobility at overall ModA. PT Short Term Goal 1 - Progress (Week 2): Partly met PT Short Term Goal 2 (Week 2): Pt will perform sit<>stand with overall ModA +1 PT Short Term Goal 2 - Progress (Week 2): Met PT Short Term Goal 3 (Week 2): pt will maintain sitting balance to midline with CGA +1 for at least . PT Short Term Goal 3 - Progress (Week 2): Met PT Short Term Goal 4 (Week 2): pt will perform squat pivot transfer with MinA +1 PT Short Term Goal 4 - Progress (Week 2): Partly met Week 3:  PT Short Term Goal 1 (Week 3): Pt will perform bed mobility at overall ModA. PT Short Term Goal 2 (Week 3): Pt will perform sit<>stand with no AD and modA +1 PT Short Term Goal 3 (Week 3): pt will maintain sitting balance to midline with supervision +1. PT Short Term Goal 4 (Week 3): pt will perform squat pivot transfer with MinA +1   Skilled Therapeutic Interventions/Progress Updates:  Patient seated upright in w/c on entrance to room. Patient alert and agreeable to PT session.   Patient with no pain complaint at start of session.  Therapeutic Activity: Bed Mobility: Pt performed supine <> sit with ***. VC/ tc required for ***. Transfers: Pt performed sit<>stand and stand pivot transfers throughout session with ***. Provided verbal cues for***.  Gait Training:  Pt ambulated *** ft using *** with ***. Demonstrated ***. Provided vc/ tc for ***.  Wheelchair Mobility:  Pt propelled wheelchair *** feet with ***. Provided vc/ tc for ***.  Neuromuscular Re-ed: NMR facilitated during session with focus on***. Pt guided in ***. NMR performed for improvements in motor control and coordination, balance,  sequencing, judgement, and self confidence/ efficacy in performing all aspects of mobility at highest level of independence.   Therapeutic Exercise: Pt performed the following exercises with vc/ tc for proper technique. ***  Patient *** at end of session with brakes locked, *** alarm set, and all needs within reach.   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 Restrictions Weight Bearing Restrictions: No General:   Vital Signs: Therapy Vitals Temp: 98.5 F (36.9 C) Pulse Rate: 76 Resp: 20 BP: 120/80 Patient Position (if appropriate): Lying Oxygen Therapy SpO2: 100 % O2 Device: Room Air Pain:    Therapy/Group: Individual Therapy  Loel Dubonnet PT, DPT, CSRS 07/21/2023, 5:28 PM

## 2023-07-21 NOTE — Progress Notes (Signed)
PROGRESS NOTE   Subjective/Complaints:  No problems reported overnight. Pt sleeping soundly when I came in but does arouse. Doesn't indicate any new problems.  ROS: limited due to language/communication    Objective:   No results found. No results for input(s): "WBC", "HGB", "HCT", "PLT" in the last 72 hours.    Recent Labs    07/20/23 0718  NA 138  K 4.1  CL 105  CO2 26  GLUCOSE 97  BUN 22*  CREATININE 1.60*  CALCIUM 9.1      Intake/Output Summary (Last 24 hours) at 07/21/2023 0908 Last data filed at 07/21/2023 7829 Gross per 24 hour  Intake 471 ml  Output 75 ml  Net 396 ml        Physical Exam: Vital Signs Blood pressure 122/82, pulse 78, temperature 98.1 F (36.7 C), resp. rate 18, height 6\' 1"  (1.854 m), weight 80.5 kg, SpO2 100%.   Constitutional: No distress . Vital signs reviewed. HEENT: NCAT, EOMI, oral membranes moist Neck: supple Cardiovascular: RRR without murmur. No JVD    Respiratory/Chest: CTA Bilaterally without wheezes or rales. Normal effort    GI/Abdomen: BS +, non-tender, non-distended Ext: no clubbing, cyanosis, or edema Psych: pleasant and cooperative  Skin: No evidence of breakdown, no evidence of rash  Neurologic:  Exp>rec aphasia, responds with head nods and occasionally with words and short phrases. Cranial nerves II through XII intact, motor strength is 5/5 in left and 0/5 right  deltoid, bicep, tricep, grip, hip flexor, knee extensors, ankle dorsiflexor and plantar flexor MAS 1-2 R elbow, 2 @ knee, and hip--appears to be generally loose.. Sensory exam ?slightly reduced sensation diffusely on the right Musculoskeletal: other than spasticity no joint related problems. No joint swelling  Assessment/Plan: 1. Functional deficits which require 3+ hours per day of interdisciplinary therapy in a comprehensive inpatient rehab setting. Physiatrist is providing close team  supervision and 24 hour management of active medical problems listed below. Physiatrist and rehab team continue to assess barriers to discharge/monitor patient progress toward functional and medical goals  Care Tool:  Bathing        Body parts bathed by helper: Right arm, Left arm, Chest, Abdomen, Front perineal area, Buttocks, Right upper leg, Left upper leg, Face, Left lower leg, Right lower leg     Bathing assist Assist Level: Dependent - Patient 0%     Upper Body Dressing/Undressing Upper body dressing   What is the patient wearing?: Pull over shirt    Upper body assist Assist Level: Total Assistance - Patient < 25%    Lower Body Dressing/Undressing Lower body dressing      What is the patient wearing?: Underwear/pull up, Incontinence brief     Lower body assist Assist for lower body dressing: Dependent - Patient 0%     Toileting Toileting    Toileting assist Assist for toileting: Dependent - Patient 0%     Transfers Chair/bed transfer  Transfers assist  Chair/bed transfer activity did not occur: Safety/medical concerns  Chair/bed transfer assist level: Dependent - mechanical lift (STEDY)     Locomotion Ambulation   Ambulation assist      Assist level: Maximal Assistance - Patient 25 -  49% Assistive device: Other (comment) (L hallway handrail) Max distance: 35 ft   Walk 10 feet activity   Assist     Assist level: Maximal Assistance - Patient 25 - 49% Assistive device: Other (comment) (L hallway hand rail)   Walk 50 feet activity   Assist Walk 50 feet with 2 turns activity did not occur: Safety/medical concerns         Walk 150 feet activity   Assist Walk 150 feet activity did not occur: Safety/medical concerns         Walk 10 feet on uneven surface  activity   Assist Walk 10 feet on uneven surfaces activity did not occur: Safety/medical concerns         Wheelchair     Assist Is the patient using a wheelchair?:  Yes Type of Wheelchair: Manual    Wheelchair assist level: Dependent - Patient 0% Max wheelchair distance: 150 ft    Wheelchair 50 feet with 2 turns activity    Assist        Assist Level: Dependent - Patient 0%   Wheelchair 150 feet activity     Assist      Assist Level: Dependent - Patient 0%   Blood pressure 122/82, pulse 78, temperature 98.1 F (36.7 C), resp. rate 18, height 6\' 1"  (1.854 m), weight 80.5 kg, SpO2 100%.  Medical Problem List and Plan: 1. 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 Severe Left M1 and V4 stenosis              -patient may  shower             -ELOS/Goals: 7/30 min A for PT, OT and SLP- family training on Sat    -Continue CIR therapies including PT, OT, and SLP      2.  Antithrombotics: -DVT/anticoagulation:  Pharmaceutical: Lovenox, LE doppler neg              -antiplatelet therapy: DAPT for at leasat  3 months. Has declined stenting.   3. Pain Management: Tylenol prn.  4. Mood/Behavior/Sleep: LCSW to follow   for evaluation and support when appropriate.              -antipsychotic agents: N/A 5. Neuropsych/cognition: This patient is not capable of making decisions on his own behalf. 6. Skin/Wound Care: Routine pressure relief measures.   7. Fluids/Electrolytes/Nutrition:   -intake improved  -he's a little dry on labs 7/25---push fluids--recheck BMET Monday 7/29 8. 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 HTN: Monitor BP TID--long term goal normotensive             --continue amlodipine 10 mg/day and coreg  6.25 mg BID.  Vitals:   07/20/23 1940 07/21/23 0324  BP: 122/81 122/82  Pulse: 92 78  Resp:  18  Temp: 98.6 F (37 C) 98.1 F (36.7 C)  SpO2: 99% 100%   Keep in 130-150 range - in range; appropriate 7/26  10. T2DM w/peripheral neuropathy: Hgb A1c- 11.7. Was on  Lantus 30 units PTA?  --Continue Insulin glargline-->was decreased to  5 units/HS today(to start tomorrow as received 10 units this am).   --Hold every 4 hours novolog as not on TF at this moment.  CBG (last 3)  Recent Labs    07/20/23 1700 07/20/23 2038 07/21/23 0616  GLUCAP 166* 295* 134*   Increased Semglee to 15U 7/7  Change SSI to moderate  7/14 increase semglee to 22 u d/t persistent elevation 7/19 increase semglee to 25 U, consider low dose glipizide in am  7/23- Added low dose amaryl  7/26 improving control with amaryl-   -reduced semglee to 18u effective 7/25  -amaryl increased to 2mg  daily 7/25  -might be able to transition off insulin at some point depending upon response and titration of amaryl 11. Acute on chronic renal failure?:  Improved with IVF for hydration-->was d/c yesterday but will resume with questionable intake --.SCr 1.02 in 09/22 per chart review. SCr 1.57 @ admission-->1.29-->1.31 PO4 and Mg ok 7/7 Off IVF monitor UO--probably close to baseline  7/26--BUN/Cr 22/1.6 yesterday--pushing fluids  -f/u BMET Monday  12. Severe dysphagia: Continue D1, honey liquids. Willing to take liquids-->will offer thickened Glucerna with meals for now.         13. Hyperlipidemia: LDL 265. On Lipitor 80 mg daily. 14. Severe vitamin D deficiency: On Ergocalciferol weekly with dialy low supplement.  15. ABLA: Hgb 13.1 @ admission-->11.1-->9.2. Will order stool guaiacs- none collected 7/5.                 Latest Ref Rng & Units 07/17/2023    6:42 AM 07/10/2023    6:58 AM 07/03/2023    9:23 AM  CBC  WBC 4.0 - 10.5 K/uL 6.7  7.9  9.9   Hemoglobin 13.0 - 17.0 g/dL 16.1  9.9  09.6   Hematocrit 39.0 - 52.0 % 29.8  29.9  32.4   Platelets 150 - 400 K/uL 278  338  361    Fluctuates 10-11 g/dL range--stable 0/45  16. Low grade fevers: Encourage pulmonary hygiene.  17. Hx tobacco/Marijuana use: Continue  nicotine patch.  Encourage cessation of Marijuana.  18. Intermittent episodes  of N/V: Question due to gastroparesis (Multiple ED visits in the past for intractable N/V). 19. Nasal congestion- ordered Flonase 1 sprays daily.   21.  Post stroke dysphagia,improved now on D3 thin liquids with good intake        Latest Ref Rng & Units 07/20/2023    7:18 AM 07/14/2023    5:59 AM 07/10/2023    6:58 AM  BMP  Glucose 70 - 99 mg/dL 97  409  811   BUN 6 - 20 mg/dL 22  19  18    Creatinine 0.61 - 1.24 mg/dL 9.14  7.82  9.56   Sodium 135 - 145 mmol/L 138  136  134   Potassium 3.5 - 5.1 mmol/L 4.1  4.1  4.2   Chloride 98 - 111 mmol/L 105  102  102   CO2 22 - 32 mmol/L 26  26  25    Calcium 8.9 - 10.3 mg/dL 9.1  8.8  8.9       22.  Spasticity severe elbow flexor tone, may need Botox / Xeomin prior to d/c, cont ROM    - Reminder to nursing to don WHO at nighhttime 7/20; R ankle ROM still WNL, no PRAFO in room, if not available would consider ordering this week before PF tone goes past neutral   - 7/21: D/w PT, start scheduled Baclofen 10 mg TID in addition to current Zanaflex.   7/26 persistent hypertonicity--increased baclofen to 15mg  TID 7/24   -might causing a little somnolence-   -his spasticity has improved with increase however   -will discuss with therapy team today. Their notes don't indicate any issues   -continue same dose and schedule for now  LOS:  24 days A FACE TO FACE EVALUATION WAS PERFORMED  Ranelle Oyster 07/21/2023, 9:08 AM

## 2023-07-21 NOTE — Plan of Care (Signed)
  Problem: RH Tub/Shower Transfers Goal: LTG Patient will perform tub/shower transfers w/assist (OT) Description: LTG: Patient will perform tub/shower transfers with assist, with/without cues using equipment (OT) Flowsheets (Taken 07/21/2023 1209) LTG: Pt will perform tub/shower stall transfers with assistance level of: (LTG discontinued as transfers are not safe at this time into a sh stall or tub even with use of a tub bench.) -- Note: LTG discontinued as transfers are not safe at this time into a sh stall or tub even with use of a tub bench.

## 2023-07-21 NOTE — Progress Notes (Signed)
Occupational Therapy Session Note  Patient Details  Name: Zachary Winters MRN: [id number] Date of Birth: [birth year]  Today's Date: 07/21/2023 OT Individual Time: [phone] OT Individual Time Calculation (min): 50 min  25 mins missed d/t pt refusal    Short Term Goals: Week 3:  OT Short Term Goal 1 (Week 3): STGs= LTGs  Skilled Therapeutic Interventions/Progress Updates:  Pt greeted supine in bed with pt initially declining session. Gave pt 25 min rest break and returned with pt then agreeing to session. Pt donned pants from bed level with overall MODA needing assist to thread pants but able to roll to pull pants to waist line. Pt completed supine>sit with MINA needing assist to elevate trunk. Pt completed squat pivot to w/c to L side with MIN A.  Pt completed seated grooming tasks at sink with pt able to turn on water and wash face with set- up assist. Pt transported to gym for time mgmt, pt completed multiple squat pivot transfers to mat table to R and L side with overall MIN- MOD A. Pts does much better transferring to L side. Practiced with stedy as potential option for DME for home however pt continues to lean to R side in stedy really needing +2 assist to complete safe transfer. Since pt only has his sister at home to assist with mobility feel that using stedy at home would not be a safe option at this time.   Practiced sit>stands from mat table with an emphasis on anterior leaning when powering into stand. Pt continues to need RLE blocked d/t flexor tone as well needing support at trunk d/t R lean. MAX cues needed to tighten quad/glutes in standing. Additionally worked on sit>stands later in room with pt standing at bed rail, pt did great pulling up into standing at bed rail but would continue to need RLE blocked and support at trunk in standing while caregiver pulled up pants to waist line for toileting.   Ended session with pt seated in w/c with alarm belt activated and all needs within  reach.                   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 Restrictions Weight Bearing Restrictions: No General: General OT Amount of Missed Time: 25 Minutes    Pain: no pain indicated during session     Therapy/Group: Individual Therapy  Pollyann Glen [facility] 07/21/2023, 12:09 PM

## 2023-07-21 NOTE — Progress Notes (Signed)
Orthopedic Tech Progress Note Patient Details:  SHARON BENTE 03/01/1981 [id number]  Patient ID: Merleen Milliner, male   DOB: 1981-05-19, 42 y.o.   MRN: [id number] [facility] called for AFO consult. Darleen Crocker 07/21/2023, 5:05 PM

## 2023-07-21 NOTE — Progress Notes (Signed)
Speech Language Pathology Daily Session Note  Patient Details  Name: Zachary Winters MRN: [id number] Date of Birth: 1981/02/12  Today's Date: 07/21/2023 SLP Individual Time: 1002-1102 SLP Individual Time Calculation (min): 60 min  Short Term Goals: Week 4: SLP Short Term Goal 1 (Week 4): Pt will follow simple one step directions with supervision visual/verbal cues SLP Short Term Goal 2 (Week 4): Pt will consistently utilize safe swallow strategies during PO intake w/ supervision cues. SLP Short Term Goal 3 (Week 4): Pt will utilize multimodal communication to express simple wants/needs with min A visual/verbal cues SLP Short Term Goal 4 (Week 4): Pt will improve speech intelligibility of single words to 75% with min A verbal and visual cues SLP Short Term Goal 5 (Week 4): Pt will complete functional naming tasks (automatic responses, responsive naming, simple confrontational naming, etc) with 75% accuracy given min A visual/verbal cues  Skilled Therapeutic Interventions: Skilled therapy session focused on dysphagia and communication goals. SLP facilitated session through providing supervision cues to follow swallowing strategies during bolus trials. Patient consumed thin liquids, puree, and solids with supervision A to utilize small sips, slow rate, and lingual sweep for pocketing. No overt s/sx of aspiration throughout trials. Mild oral residuals after consumption of solids. Recommend continuation of current diet. Patient completed oral care after bolus trials with mod I assist. To address communication, SLP provided min A for patient to utilize white board to write and answer questions. Patient able to name animals given descriptions in 50% of opportunities. Patient with utilization of white board and able to write "[name], pig, bird and drink" independently. Patient able to write "hello, I love you, dog and cat" given min visual cues. Patient left in [facility] with alarm set and call bell within reach.  Continue POC.   Pain None Stated  Therapy/Group: Individual Therapy  Quintel Mccalla M.A., CF-SLP 07/21/2023, 12:25 PM

## 2023-07-22 DIAGNOSIS — I152 Hypertension secondary to endocrine disorders: Secondary | ICD-10-CM

## 2023-07-22 DIAGNOSIS — E1159 Type 2 diabetes mellitus with other circulatory complications: Secondary | ICD-10-CM

## 2023-07-22 DIAGNOSIS — R1312 Dysphagia, oropharyngeal phase: Secondary | ICD-10-CM

## 2023-07-22 DIAGNOSIS — E1140 Type 2 diabetes mellitus with diabetic neuropathy, unspecified: Secondary | ICD-10-CM

## 2023-07-22 LAB — GLUCOSE, CAPILLARY
Glucose-Capillary: 122 mg/dL — ABNORMAL HIGH (ref 70–99)
Glucose-Capillary: 183 mg/dL — ABNORMAL HIGH (ref 70–99)
Glucose-Capillary: 101 mg/dL — ABNORMAL HIGH (ref 70–99)
Glucose-Capillary: 159 mg/dL — ABNORMAL HIGH (ref 70–99)

## 2023-07-22 NOTE — Progress Notes (Signed)
Speech Language Pathology Daily Session Note  Patient Details  Name: Zachary Winters MRN: [id number] Date of Birth: [birth year]  Today's Date: 07/22/2023 SLP Individual Time: 1115-1200 SLP Individual Time Calculation (min): 45 min  Short Term Goals: Week 4: SLP Short Term Goal 1 (Week 4): Pt will follow simple one step directions with supervision visual/verbal cues SLP Short Term Goal 2 (Week 4): Pt will consistently utilize safe swallow strategies during PO intake w/ supervision cues. SLP Short Term Goal 3 (Week 4): Pt will utilize multimodal communication to express simple wants/needs with min A visual/verbal cues SLP Short Term Goal 4 (Week 4): Pt will improve speech intelligibility of single words to 75% with min A verbal and visual cues SLP Short Term Goal 5 (Week 4): Pt will complete functional naming tasks (automatic responses, responsive naming, simple confrontational naming, etc) with 75% accuracy given min A visual/verbal cues  Skilled Therapeutic Interventions: Pt seen for skilled ST with focus on swallowing, communication goals and patient/family education. SLP facilitating session by providing overall mod A multimodal cues to facilitate functional communication. Pt provided white board and low level AAC as supplements, family reports pt gets frustrated with white board d/t difficulty writing with L hand. SLP providing education on communication strategies to utilize with patient at home to increase communicative attempts/accuracy and reduce frustration. Discussed high frequency words to practice and simple words to communicate basic wants/needs. Pt able to approximate names of family members in room, son's name, [name] and "when" and "going" in relation to discharge date. Family and patient also educated extensively on ongoing use of aggressive oral care and safe swallow strategies including small bites, small sips, slow rate, no straw and checking for pocketed material. Family  reports patient still requires consistent cues to utilize these strategies with Dys 3 diet. SLP providing handout on foods approved and foods to avoid on a Dys 3 diet, family demonstrates excellent understanding and teach back on all information presented. Pt left in wheelchair with all needs met, cont ST POC.   Pain Pain Assessment Pain Scale: 0-10 Pain Score: 0-No pain  Therapy/Group: Individual Therapy  Tacey Ruiz 07/22/2023, 12:01 PM

## 2023-07-22 NOTE — Progress Notes (Signed)
Occupational Therapy Session Note  Patient Details  Name: Zachary Winters MRN: [id number] Date of Birth: 1981-07-17  Today's Date: 07/22/2023 OT Individual Time: [phone] OT Individual Time Calculation (min): 54 min    Short Term Goals: Week 3:  OT Short Term Goal 1 (Week 3): STGs= LTGs  Skilled Therapeutic Interventions/Progress Updates:  Pt greeted supine in bed with sister and grandmother present for family education. Education provided on pts current level of assist. At this time recommend pt complete dressing from bed level but did educate family that pt can sit EOB with close guarding. Sister completed multiple reps of squat pivot transfers to R and L side from w/c<>EOB and EOB<>DABSC. Provided education on options for toileting such as lateral leans for pericare with BSC beside bed, standing into stedy ( if family decided to purchase- handout provided on where to purchase stedy) or returning back to bed via squat pivot for pericare.   Education provided on hemi techniques, RUE handling during transfers, subluxation education, don/doffing resting hand splint.   Did have sister stand with pt at bed rail as an option for LB dressing. Education provided on importance of blocking RLE during standing. Sister reports that she does think w/c will fit into bathroom however sister reports wanting showering /going into bathroom to be a HH goal.   Education provided on bed mobility technique i.e having pt hook LLE under RLE with pt needing light assist to elevate trunk into sitting. Education provided on use of gait belt for all OOB mobility. Recommended that pt sit up in w/c during the day with pt completing pressure relief every hour. Recommended pt wear incontinence briefs at home as well as utilize incontinence bed pads for home to decrease caregiver burden.  Ended session with pt seated in w/c with family present and all needs within reach.   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 Restrictions Weight Bearing Restrictions: No    Pain: no pain indicated during session     Therapy/Group: Individual Therapy  Pollyann Glen [facility] 07/22/2023, 12:14 PM

## 2023-07-22 NOTE — Progress Notes (Signed)
PROGRESS NOTE   Subjective/Complaints:  NO acute events overnight. No new concerns this AM elicited.   ROS: limited due to language/communication    Objective:   No results found. No results for input(s): "WBC", "HGB", "HCT", "PLT" in the last 72 hours.    Recent Labs    07/20/23 0718  NA 138  K 4.1  CL 105  CO2 26  GLUCOSE 97  BUN 22*  CREATININE 1.60*  CALCIUM 9.1      Intake/Output Summary (Last 24 hours) at 07/22/2023 1347 Last data filed at 07/22/2023 0814 Gross per 24 hour  Intake 472 ml  Output --  Net 472 ml        Physical Exam: Vital Signs Blood pressure 123/77, pulse 65, temperature 98.2 F (36.8 C), temperature source Oral, resp. rate 16, height 6\' 1"  (1.854 m), weight 82.9 kg, SpO2 99%.   Constitutional: No distress . Vital signs reviewed. HEENT: NCAT, EOMI, oral membranes moist Neck: supple Cardiovascular: RRR without murmur. No JVD    Respiratory/Chest: CTA Bilaterally without wheezes or rales. Normal effort    GI/Abdomen: BS +, non-tender, non-distended Ext: no clubbing, cyanosis, or edema Psych: pleasant and cooperative  Skin: No evidence of breakdown, no evidence of rash  Neurologic:  Exp>rec aphasia, responds with head nods and occasionally with words and short phrases. Cranial nerves II through XII intact, motor strength is 5/5 in left and 0/5 right  deltoid, bicep, tricep, grip, hip flexor, knee extensors, ankle dorsiflexor and plantar flexor MAS 1-2 R elbow, 2 @ knee, and hip--appears to be generally loose.. Sensory exam ?slightly reduced sensation diffusely on the right Musculoskeletal: other than spasticity no joint related problems. No joint swelling Wearing right AFO  Assessment/Plan: 1. Functional deficits which require 3+ hours per day of interdisciplinary therapy in a comprehensive inpatient rehab setting. Physiatrist is providing close team supervision and 24 hour  management of active medical problems listed below. Physiatrist and rehab team continue to assess barriers to discharge/monitor patient progress toward functional and medical goals  Care Tool:  Bathing        Body parts bathed by helper: Right arm, Left arm, Chest, Abdomen, Front perineal area, Buttocks, Right upper leg, Left upper leg, Face, Left lower leg, Right lower leg     Bathing assist Assist Level: Dependent - Patient 0%     Upper Body Dressing/Undressing Upper body dressing   What is the patient wearing?: Pull over shirt    Upper body assist Assist Level: Total Assistance - Patient < 25%    Lower Body Dressing/Undressing Lower body dressing      What is the patient wearing?: Underwear/pull up, Incontinence brief     Lower body assist Assist for lower body dressing: Dependent - Patient 0%     Toileting Toileting    Toileting assist Assist for toileting: Dependent - Patient 0%     Transfers Chair/bed transfer  Transfers assist  Chair/bed transfer activity did not occur: Safety/medical concerns  Chair/bed transfer assist level: Dependent - mechanical lift (STEDY)     Locomotion Ambulation   Ambulation assist      Assist level: Maximal Assistance - Patient 25 - 49% Assistive  device: Other (comment) (L hallway handrail) Max distance: 35 ft   Walk 10 feet activity   Assist     Assist level: Maximal Assistance - Patient 25 - 49% Assistive device: Other (comment) (L hallway hand rail)   Walk 50 feet activity   Assist Walk 50 feet with 2 turns activity did not occur: Safety/medical concerns         Walk 150 feet activity   Assist Walk 150 feet activity did not occur: Safety/medical concerns         Walk 10 feet on uneven surface  activity   Assist Walk 10 feet on uneven surfaces activity did not occur: Safety/medical concerns         Wheelchair     Assist Is the patient using a wheelchair?: Yes Type of Wheelchair:  Manual    Wheelchair assist level: Dependent - Patient 0% Max wheelchair distance: 150 ft    Wheelchair 50 feet with 2 turns activity    Assist        Assist Level: Dependent - Patient 0%   Wheelchair 150 feet activity     Assist      Assist Level: Dependent - Patient 0%   Blood pressure 123/77, pulse 65, temperature 98.2 F (36.8 C), temperature source Oral, resp. rate 16, height 6\' 1"  (1.854 m), weight 82.9 kg, SpO2 99%.  Medical Problem List and Plan: 1. 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 Severe Left M1 and V4 stenosis              -patient may  shower             -ELOS/Goals: 7/30 min A for PT, OT and SLP- family training on Sat    -Continue CIR therapies including PT, OT, and SLP      -Continue AFO right lower extremity  2.  Antithrombotics: -DVT/anticoagulation:  Pharmaceutical: Lovenox, LE doppler neg              -antiplatelet therapy: DAPT for at leasat  3 months. Has declined stenting.   3. Pain Management: Tylenol prn.  4. Mood/Behavior/Sleep: LCSW to follow   for evaluation and support when appropriate.              -antipsychotic agents: N/A 5. Neuropsych/cognition: This patient is not capable of making decisions on his own behalf. 6. Skin/Wound Care: Routine pressure relief measures.   7. Fluids/Electrolytes/Nutrition:   -intake improved  -he's a little dry on labs 7/25---push fluids--recheck BMET Monday 7/29 8. 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 HTN: Monitor BP TID--long term goal normotensive             --continue amlodipine 10 mg/day and coreg  6.25 mg BID.  Vitals:   07/22/23 0328 07/22/23 1335  BP: 137/84 123/77  Pulse: 74 65  Resp: 16 16  Temp: 97.8 F (36.6 C) 98.2 F (36.8 C)  SpO2: 100% 99%   Keep in 130-150 range - in range; appropriate 7/26 7/27 BP  controlled  10. T2DM w/peripheral neuropathy: Hgb A1c- 11.7. Was on Lantus 30 units PTA?  --Continue Insulin glargline-->was decreased to  5 units/HS today(to start tomorrow as received 10 units this am).   --Hold every 4 hours novolog as not on TF at this moment.  CBG (last 3)  Recent Labs    07/21/23 2109 07/22/23 0624 07/22/23 1158  GLUCAP  325* 122* 183*   Increased Semglee to 15U 7/7 Change SSI to moderate  7/14 increase semglee to 22 u d/t persistent elevation 7/19 increase semglee to 25 U, consider low dose glipizide in am  7/23- Added low dose amaryl  7/26 improving control with amaryl-   -reduced semglee to 18u effective 7/25  -amaryl increased to 2mg  daily 7/25  -might be able to transition off insulin at some point depending upon response and titration of amaryl 7/27 CBGs improved today however were elevated last night, continue to monitor response to Amaryl change 11. Acute on chronic renal failure?:  Improved with IVF for hydration-->was d/c yesterday but will resume with questionable intake --.SCr 1.02 in 09/22 per chart review. SCr 1.57 @ admission-->1.29-->1.31 PO4 and Mg ok 7/7 Off IVF monitor UO--probably close to baseline  7/26--BUN/Cr 22/1.6 yesterday--pushing fluids  -f/u BMET Monday  12. Severe dysphagia: Continue D1, honey liquids. Willing to take liquids-->will offer thickened Glucerna with meals for now.  7/27 has been on DYS 3 /thin diet, continue to monitor intake         13. Hyperlipidemia: LDL 265. On Lipitor 80 mg daily. 14. Severe vitamin D deficiency: On Ergocalciferol weekly with dialy low supplement.  15. ABLA: Hgb 13.1 @ admission-->11.1-->9.2. Will order stool guaiacs- none collected 7/5.                 Latest Ref Rng & Units 07/17/2023    6:42 AM 07/10/2023    6:58 AM 07/03/2023    9:23 AM  CBC  WBC 4.0 - 10.5 K/uL 6.7  7.9  9.9   Hemoglobin 13.0 - 17.0 g/dL 62.1  9.9  30.8   Hematocrit 39.0 - 52.0 % 29.8  29.9  32.4   Platelets 150 - 400  K/uL 278  338  361    Fluctuates 10-11 g/dL range--stable 6/57  16. Low grade fevers: Encourage pulmonary hygiene.  17. Hx tobacco/Marijuana use: Continue  nicotine patch.  Encourage cessation of Marijuana.  18. Intermittent episodes of N/V: Question due to gastroparesis (Multiple ED visits in the past for intractable N/V). 19. Nasal congestion- ordered Flonase 1 sprays daily.   21.  Post stroke dysphagia,improved now on D3 thin liquids with good intake        Latest Ref Rng & Units 07/20/2023    7:18 AM 07/14/2023    5:59 AM 07/10/2023    6:58 AM  BMP  Glucose 70 - 99 mg/dL 97  846  962   BUN 6 - 20 mg/dL 22  19  18    Creatinine 0.61 - 1.24 mg/dL 9.52  8.41  3.24   Sodium 135 - 145 mmol/L 138  136  134   Potassium 3.5 - 5.1 mmol/L 4.1  4.1  4.2   Chloride 98 - 111 mmol/L 105  102  102   CO2 22 - 32 mmol/L 26  26  25    Calcium 8.9 - 10.3 mg/dL 9.1  8.8  8.9       22.  Spasticity severe elbow flexor tone, may need Botox / Xeomin prior to d/c, cont ROM    - Reminder to nursing to don WHO at nighhttime 7/20; R ankle ROM still WNL, no PRAFO in room, if not available would consider ordering this week before PF tone goes past neutral   - 7/21: D/w PT, start scheduled Baclofen 10 mg TID in addition to current Zanaflex.   7/26 persistent hypertonicity--increased baclofen to 15mg  TID 7/24   -  might causing a little somnolence-   -his spasticity has improved with increase however   -will discuss with therapy team today. Their notes don't indicate any issues   -continue same dose and schedule for now  LOS: 25 days A FACE TO FACE EVALUATION WAS PERFORMED  Fanny Dance 07/22/2023, 1:47 PM

## 2023-07-22 NOTE — Progress Notes (Addendum)
Physical Therapy Session Note  Patient Details  Name: Zachary Winters MRN: [id number] Date of Birth: [birth year]  Today's Date: 07/22/2023 PT Individual Time: 1005-1110 PT Individual Time Calculation (min): 65 min   Short Term Goals: Week 3:  PT Short Term Goal 1 (Week 3): Pt will perform bed mobility at overall ModA. PT Short Term Goal 2 (Week 3): Pt will perform sit<>stand with no AD and modA +1 PT Short Term Goal 3 (Week 3): pt will maintain sitting balance to midline with supervision +1. PT Short Term Goal 4 (Week 3): pt will perform squat pivot transfer with MinA +1   Skilled Therapeutic Interventions/Progress Updates:  Patient seated upright in w/c on entrance to room. Patient alert and agreeable to PT session. Sister, mother and grandmother all present. Sister relates good session with OT and good practice with squat pivot transfers.   Patient with no pain complaint at start of session.  Discussion with family re: pt's ability to propel w/c. Education provided initially on w/c parts mgmt. Mother is familiar with w/c breakdown. Sister is able to add and remove footrests. Educated family on need for brakes to be on and feet on floor (not footrests) for all transfers. Sister follows pt and observes ability to maneuver but also pt's frustration when unable to maintain path.   Demonstrated to sister, pt's difficulty in full understanding of use of L hemibody. Demos stance at handrail with pt continuing to lean to R side instead of finding balance over LLE with pull to L side. Also demo'd ambulation with pt with AFO donned. Continues to require maxA for RLE advancement as well as to maintain balance toward L side. Informed sister that AFO will come for pt on Monday and size 13 shoes acquired. CPO will add leather toe cap to medial aspect of R shoe and will add heel lift to L shoe to assist with RLE clearance for improved advancement.   Demonstrated to sister and family use of slide board to  transfer in/out of simulated car transfer using end of bed with footboard removed. Pt instructed on use/ balance with education to sister on providing cues as well as block of RLE to maintain positioning during transfer into car. Completes with MaxA for board placement and then MinA up slight ramp into simulated car height. The sister providing good block to LLE, placement of board with CGA/ MinA from therapist and MaxA to pt. Then pt able to follow instructions to push bottom away from L hand placement on board and sister maintaining board position AND pt's balance. Pt is able to complete with Min/ ModA from sister then able to self complete position once in w/c.  Patient seated upright in w/c at end of session with brakes locked, no alarm set as family providing supervision, and all needs within reach.   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 Restrictions Weight Bearing Restrictions: No Pain: Pain Assessment Pain Scale: 0-10 Pain Score: 0-No pain  Therapy/Group: Individual Therapy  Loel Dubonnet PT, DPT, CSRS 07/22/2023, 1:45 PM

## 2023-07-23 LAB — GLUCOSE, CAPILLARY
Glucose-Capillary: 91 mg/dL (ref 70–99)
Glucose-Capillary: 139 mg/dL — ABNORMAL HIGH (ref 70–99)
Glucose-Capillary: 195 mg/dL — ABNORMAL HIGH (ref 70–99)
Glucose-Capillary: 171 mg/dL — ABNORMAL HIGH (ref 70–99)

## 2023-07-23 NOTE — Progress Notes (Signed)
PROGRESS NOTE   Subjective/Complaints:  No events overnight recorded.  No new concerns this morning.  He is resting in bed and appears comfortable.  ROS: limited due to language/communication    Objective:   No results found. No results for input(s): "WBC", "HGB", "HCT", "PLT" in the last 72 hours.    No results for input(s): "NA", "K", "CL", "CO2", "GLUCOSE", "BUN", "CREATININE", "CALCIUM" in the last 72 hours.     Intake/Output Summary (Last 24 hours) at 07/23/2023 2043 Last data filed at 07/23/2023 1753 Gross per 24 hour  Intake 600 ml  Output --  Net 600 ml        Physical Exam: Vital Signs Blood pressure 125/79, pulse 80, temperature 98.4 F (36.9 C), temperature source Oral, resp. rate 18, height 6\' 1"  (1.854 m), weight 82.9 kg, SpO2 100%.   Constitutional: NAD vital signs reviewed.  Resting in bed appears comfortable HEENT: NCAT, conjugate gaze, MMM Neck: supple Cardiovascular: RRR Respiratory/Chest: CTAB GI/Abdomen: BS +, non-tender, non-distended Ext: no clubbing, cyanosis, or edema Psych: pleasant and cooperative  Skin: No evidence of breakdown, no evidence of rash  Neurologic:  Exp>rec aphasia, responds with head nods and occasionally with words and short phrases. Cranial nerves II through XII grossly intact, motor moving left arm and leg freely MAS 1-2 R elbow, 2 @ knee, and hip--appears to be generally loose..  Musculoskeletal: other than spasticity no joint related problems. No joint swelling  Assessment/Plan: 1. Functional deficits which require 3+ hours per day of interdisciplinary therapy in a comprehensive inpatient rehab setting. Physiatrist is providing close team supervision and 24 hour management of active medical problems listed below. Physiatrist and rehab team continue to assess barriers to discharge/monitor patient progress toward functional and medical goals  Care  Tool:  Bathing        Body parts bathed by helper: Right arm, Left arm, Chest, Abdomen, Front perineal area, Buttocks, Right upper leg, Left upper leg, Face, Left lower leg, Right lower leg     Bathing assist Assist Level: Dependent - Patient 0%     Upper Body Dressing/Undressing Upper body dressing   What is the patient wearing?: Pull over shirt    Upper body assist Assist Level: Total Assistance - Patient < 25%    Lower Body Dressing/Undressing Lower body dressing      What is the patient wearing?: Underwear/pull up, Incontinence brief     Lower body assist Assist for lower body dressing: Dependent - Patient 0%     Toileting Toileting    Toileting assist Assist for toileting: Dependent - Patient 0%     Transfers Chair/bed transfer  Transfers assist  Chair/bed transfer activity did not occur: Safety/medical concerns  Chair/bed transfer assist level: Dependent - mechanical lift (STEDY)     Locomotion Ambulation   Ambulation assist      Assist level: Maximal Assistance - Patient 25 - 49% Assistive device: Other (comment) (L hallway handrail) Max distance: 35 ft   Walk 10 feet activity   Assist     Assist level: Maximal Assistance - Patient 25 - 49% Assistive device: Other (comment) (L hallway hand rail)   Walk 50 feet activity  Assist Walk 50 feet with 2 turns activity did not occur: Safety/medical concerns         Walk 150 feet activity   Assist Walk 150 feet activity did not occur: Safety/medical concerns         Walk 10 feet on uneven surface  activity   Assist Walk 10 feet on uneven surfaces activity did not occur: Safety/medical concerns         Wheelchair     Assist Is the patient using a wheelchair?: Yes Type of Wheelchair: Manual    Wheelchair assist level: Dependent - Patient 0% Max wheelchair distance: 150 ft    Wheelchair 50 feet with 2 turns activity    Assist        Assist Level: Dependent  - Patient 0%   Wheelchair 150 feet activity     Assist      Assist Level: Dependent - Patient 0%   Blood pressure 125/79, pulse 80, temperature 98.4 F (36.9 C), temperature source Oral, resp. rate 18, height 6\' 1"  (1.854 m), weight 82.9 kg, SpO2 100%.  Medical Problem List and Plan: 1. 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 Severe Left M1 and V4 stenosis              -patient may  shower             -ELOS/Goals: 7/30 min A for PT, OT and SLP- family training on Sat    -Continue CIR therapies including PT, OT, and SLP      -Continue AFO right lower extremity  2.  Antithrombotics: -DVT/anticoagulation:  Pharmaceutical: Lovenox, LE doppler neg              -antiplatelet therapy: DAPT for at leasat  3 months. Has declined stenting.   3. Pain Management: Tylenol prn.  4. Mood/Behavior/Sleep: LCSW to follow   for evaluation and support when appropriate.              -antipsychotic agents: N/A 5. Neuropsych/cognition: This patient is not capable of making decisions on his own behalf. 6. Skin/Wound Care: Routine pressure relief measures.   7. Fluids/Electrolytes/Nutrition:   -intake improved  -he's a little dry on labs 7/25---push fluids--recheck BMET Monday 7/29 8. 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 HTN: Monitor BP TID--long term goal normotensive             --continue amlodipine 10 mg/day and coreg  6.25 mg BID.  Vitals:   07/23/23 1237 07/23/23 1944  BP: (!) 142/94 125/79  Pulse: (!) 103 80  Resp: 17 18  Temp: 99.1 F (37.3 C) 98.4 F (36.9 C)  SpO2: 100% 100%   Keep in 130-150 range - in range; appropriate 7/26 7/27-8 BP controlled overall.  Continue current regimen  10. T2DM w/peripheral neuropathy: Hgb A1c- 11.7. Was on Lantus 30 units PTA?  --Continue Insulin glargline-->was decreased to  5 units/HS  today(to start tomorrow as received 10 units this am).   --Hold every 4 hours novolog as not on TF at this moment.  CBG (last 3)  Recent Labs    07/23/23 0607 07/23/23 1122 07/23/23 1636  GLUCAP 91 139* 195*   Increased Semglee to 15U 7/7 Change SSI to moderate  7/14 increase semglee to 22 u d/t persistent elevation 7/19 increase semglee to 25 U, consider low dose glipizide in am  7/23- Added low dose  amaryl  7/26 improving control with amaryl-   -reduced semglee to 18u effective 7/25  -amaryl increased to 2mg  daily 7/25  -might be able to transition off insulin at some point depending upon response and titration of amaryl 7/28 CBGs appear to be improving, continue current regimen 11. Acute on chronic renal failure?:  Improved with IVF for hydration-->was d/c yesterday but will resume with questionable intake --.SCr 1.02 in 09/22 per chart review. SCr 1.57 @ admission-->1.29-->1.31 PO4 and Mg ok 7/7 Off IVF monitor UO--probably close to baseline  7/26--BUN/Cr 22/1.6 yesterday--pushing fluids  -f/u BMET tomorrow  12. Severe dysphagia: Continue D1, honey liquids. Willing to take liquids-->will offer thickened Glucerna with meals for now.  7/27 has been on DYS 3 /thin diet, continue to monitor intake         7/28 appears to have good oral intake 100% of most meals 13. Hyperlipidemia: LDL 265. On Lipitor 80 mg daily. 14. Severe vitamin D deficiency: On Ergocalciferol weekly with dialy low supplement.  15. ABLA: Hgb 13.1 @ admission-->11.1-->9.2. Will order stool guaiacs- none collected 7/5.                 Latest Ref Rng & Units 07/17/2023    6:42 AM 07/10/2023    6:58 AM 07/03/2023    9:23 AM  CBC  WBC 4.0 - 10.5 K/uL 6.7  7.9  9.9   Hemoglobin 13.0 - 17.0 g/dL 40.9  9.9  81.1   Hematocrit 39.0 - 52.0 % 29.8  29.9  32.4   Platelets 150 - 400 K/uL 278  338  361    Fluctuates 10-11 g/dL range--stable 9/14  16. Low grade fevers: Encourage pulmonary hygiene.  17. Hx  tobacco/Marijuana use: Continue  nicotine patch.  Encourage cessation of Marijuana.  18. Intermittent episodes of N/V: Question due to gastroparesis (Multiple ED visits in the past for intractable N/V). 19. Nasal congestion- ordered Flonase 1 sprays daily.   21.  Post stroke dysphagia,improved now on D3 thin liquids with good intake        Latest Ref Rng & Units 07/20/2023    7:18 AM 07/14/2023    5:59 AM 07/10/2023    6:58 AM  BMP  Glucose 70 - 99 mg/dL 97  782  956   BUN 6 - 20 mg/dL 22  19  18    Creatinine 0.61 - 1.24 mg/dL 2.13  0.86  5.78   Sodium 135 - 145 mmol/L 138  136  134   Potassium 3.5 - 5.1 mmol/L 4.1  4.1  4.2   Chloride 98 - 111 mmol/L 105  102  102   CO2 22 - 32 mmol/L 26  26  25    Calcium 8.9 - 10.3 mg/dL 9.1  8.8  8.9       22.  Spasticity severe elbow flexor tone, may need Botox / Xeomin prior to d/c, cont ROM    - Reminder to nursing to don WHO at nighhttime 7/20; R ankle ROM still WNL, no PRAFO in room, if not available would consider ordering this week before PF tone goes past neutral   - 7/21: D/w PT, start scheduled Baclofen 10 mg TID in addition to current Zanaflex.   7/26 persistent hypertonicity--increased baclofen to 15mg  TID 7/24   -might causing a little somnolence-   -his spasticity has improved with increase however   -will discuss with therapy team today. Their notes don't indicate any issues   -continue same dose and schedule for now  -  7/28 continue current baclofen dose, will see if any issues with sedation tomorrow with therapy  LOS: 26 days A FACE TO FACE EVALUATION WAS PERFORMED  Fanny Dance 07/23/2023, 8:43 PM

## 2023-07-24 ENCOUNTER — Other Ambulatory Visit (HOSPITAL_COMMUNITY): Payer: Self-pay

## 2023-07-24 LAB — GLUCOSE, CAPILLARY
Glucose-Capillary: 119 mg/dL — ABNORMAL HIGH (ref 70–99)
Glucose-Capillary: 153 mg/dL — ABNORMAL HIGH (ref 70–99)
Glucose-Capillary: 129 mg/dL — ABNORMAL HIGH (ref 70–99)
Glucose-Capillary: 130 mg/dL — ABNORMAL HIGH (ref 70–99)

## 2023-07-24 MED ORDER — TIZANIDINE HCL 4 MG PO TABS
4.0000 mg | ORAL_TABLET | Freq: Three times a day (TID) | ORAL | 0 refills | Status: DC
  Filled 2023-07-24: qty 90, 30d supply, fill #0

## 2023-07-24 MED ORDER — GLIMEPIRIDE 2 MG PO TABS
2.0000 mg | ORAL_TABLET | Freq: Every day | ORAL | 0 refills | Status: DC
  Filled 2023-07-24: qty 30, 30d supply, fill #0
  Filled 2023-07-24: qty 24, 24d supply, fill #0

## 2023-07-24 MED ORDER — INSULIN GLARGINE 100 UNIT/ML SOLOSTAR PEN
18.0000 [IU] | PEN_INJECTOR | Freq: Every day | SUBCUTANEOUS | 11 refills | Status: DC
  Filled 2023-07-24: qty 15, 83d supply, fill #0

## 2023-07-24 MED ORDER — NICOTINE 21 MG/24HR TD PT24
21.0000 mg | MEDICATED_PATCH | Freq: Every day | TRANSDERMAL | 0 refills | Status: DC
  Filled 2023-07-24: qty 30, 30d supply, fill #0

## 2023-07-24 MED ORDER — BACLOFEN 10 MG PO TABS
15.0000 mg | ORAL_TABLET | Freq: Three times a day (TID) | ORAL | 0 refills | Status: DC
Start: 2023-07-24 — End: 2023-08-24
  Filled 2023-07-24: qty 135, 30d supply, fill #0

## 2023-07-24 MED ORDER — CLOPIDOGREL BISULFATE 75 MG PO TABS
75.0000 mg | ORAL_TABLET | Freq: Every day | ORAL | 0 refills | Status: DC
  Filled 2023-07-24: qty 30, 30d supply, fill #0

## 2023-07-24 MED ORDER — BISACODYL 10 MG RE SUPP
10.0000 mg | Freq: Every day | RECTAL | 0 refills | Status: DC | PRN
  Filled 2023-07-24: qty 10, 10d supply, fill #0

## 2023-07-24 MED ORDER — ATORVASTATIN CALCIUM 80 MG PO TABS
80.0000 mg | ORAL_TABLET | Freq: Every day | ORAL | 0 refills | Status: DC
  Filled 2023-07-24: qty 30, 30d supply, fill #0

## 2023-07-24 MED ORDER — SENNOSIDES-DOCUSATE SODIUM 8.6-50 MG PO TABS
1.0000 | ORAL_TABLET | Freq: Every day | ORAL | 0 refills | Status: DC
  Filled 2023-07-24: qty 30, 30d supply, fill #0

## 2023-07-24 MED ORDER — INSULIN PEN NEEDLE 32G X 4 MM MISC
1.0000 | Freq: Every day | 0 refills | Status: DC
  Filled 2023-07-24: qty 100, 34d supply, fill #0

## 2023-07-24 MED ORDER — TAMSULOSIN HCL 0.4 MG PO CAPS
0.4000 mg | ORAL_CAPSULE | Freq: Every day | ORAL | 0 refills | Status: DC
  Filled 2023-07-24: qty 30, 30d supply, fill #0

## 2023-07-24 MED ORDER — AMLODIPINE BESYLATE 10 MG PO TABS
10.0000 mg | ORAL_TABLET | Freq: Every day | ORAL | 0 refills | Status: DC
  Filled 2023-07-24: qty 30, 30d supply, fill #0

## 2023-07-24 MED ORDER — ADULT MULTIVITAMIN W/MINERALS CH
1.0000 | ORAL_TABLET | Freq: Every day | ORAL | 0 refills | Status: DC
  Filled 2023-07-24: qty 30, 30d supply, fill #0

## 2023-07-24 MED ORDER — ASPIRIN 81 MG PO CHEW: 81.0000 mg | CHEWABLE_TABLET | Freq: Every day | ORAL | 0 refills | Status: DC

## 2023-07-24 MED ORDER — CARVEDILOL 6.25 MG PO TABS
6.2500 mg | ORAL_TABLET | Freq: Two times a day (BID) | ORAL | 0 refills | Status: DC
  Filled 2023-07-24: qty 60, 30d supply, fill #0

## 2023-07-24 MED ORDER — VITAMIN D (ERGOCALCIFEROL) 1.25 MG (50000 UNIT) PO CAPS
50000.0000 [IU] | ORAL_CAPSULE | ORAL | 0 refills | Status: DC
  Filled 2023-07-24: qty 7, 49d supply, fill #0

## 2023-07-24 MED ORDER — FLUTICASONE PROPIONATE 50 MCG/ACT NA SUSP
2.0000 | Freq: Every day | NASAL | 0 refills | Status: DC
  Filled 2023-07-24: qty 16, 30d supply, fill #0

## 2023-07-24 MED ORDER — BETHANECHOL CHLORIDE 10 MG PO TABS
10.0000 mg | ORAL_TABLET | Freq: Four times a day (QID) | ORAL | 0 refills | Status: DC
  Filled 2023-07-24: qty 120, 30d supply, fill #0

## 2023-07-24 MED ORDER — ACETAMINOPHEN 325 MG PO TABS: 325.0000 mg | ORAL_TABLET | ORAL | Status: DC | PRN

## 2023-07-24 NOTE — Progress Notes (Signed)
Patient ID: Zachary Winters, male   DOB: 1981-05-10, 42 y.o.   MRN: [id number]  Hospital bed, Wheelchair, Transfer Board and Hemi Walker ordered through [employer].

## 2023-07-24 NOTE — Progress Notes (Signed)
Occupational Therapy Session Note  Patient Details  Name: Zachary Winters MRN: [id number] Date of Birth: [birth year]  Today's Date: 07/24/2023 OT Individual Time: 4166-0630 OT Individual Time Calculation (min): 73 min    Short Term Goals: Week 3:  OT Short Term Goal 1 (Week 3): STGs= LTGs  Skilled Therapeutic Interventions/Progress Updates:  Pt greeted supine in bed with NT present. Pt hesitantly agreeable to OT intervention as pt initially stating "no." Donned pants from bed level with MOD A, pt needed assist to thread pants from supine but assisted with pulling pants to waist line via rolling and bridge. Pt completed supine>sit with MINA needed to elevate trunk. Pt donned OH shirt with MIN A from EOB needing assist to thread RUE through sleeve. Pt completed squat pivot transfer to w/c to L side with MIN A. Pt set- up for self feeding. Completed required DC assessments as indicated below while pt ate breakfast.   Pt completed seated oral care at sink with set- up assist. Total A transport to gym where pt completed sit>stand at parallel bars with MIN A however RLE noted to go into flexor tone needing MAX A for standing balance.   Practiced Squat pivots to toilet to L side from w/c with pt needed MOD A to R and MIN A to L side.   Initiated HEP for RUE with pt completing below therex with pt able to return demo all therex, recommend pt complete HEP with sister. HEP placed in pt education binder to be taken home at DC.   Access Code: [email or web address] URL: [email or web address] Date: 07/24/2023 Prepared by:   Exercises - Supported Elbow Flexion Extension PROM  - 1 x daily - 7 x weekly - 3 sets - 10 reps - Seated Forearm Pronation PROM  - 1 x daily - 7 x weekly - 3 sets - 10 reps - Seated Wrist Supination PROM  - 1 x daily - 7 x weekly - 3 sets - 10 reps - Seated Wrist Radial Ulnar Deviation PROM  - 1 x daily - 7 x weekly - 3 sets - 10 reps - Wrist Circumduction PROM  - 1 x daily - 7 x  weekly - 3 sets - 10 reps - Supine Shoulder Flexion AAROM with Hands Clasped  - 1 x daily - 7 x weekly - 3 sets - 10 reps  Ended session with pt seated in w/c with alarm belt activated and all needs within reach.   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 Restrictions Weight Bearing Restrictions: No  Pain: no pain indicated during session    Vision Baseline Vision/History: 5 Retinopathy (per report from family) Patient Visual Report: No change from baseline Perception  Praxis Cognition Cognition Overall Cognitive Status: Difficult to assess Arousal/Alertness: Awake/alert Brief Interview for Mental Status (BIMS) Repetition of Three Words (First Attempt): No answer Temporal Orientation: Year: No answer Temporal Orientation: Month: No answer Temporal Orientation: Day: Incorrect Recall: "Sock": No, could not recall Recall: "Blue": No, could not recall Recall: "Bed": No, could not recall BIMS Summary Score: 0 Sensation Sensation Light Touch: Impaired by gross assessment Hot/Cold: Not tested Proprioception: Impaired by gross assessment Stereognosis: Not tested Additional Comments: assessment limited d/t aphasia but does report sensation in RUE but reports it does not feel "normal" Coordination Gross Motor Movements are Fluid and Coordinated: No Fine Motor Movements are Fluid and Coordinated: No Coordination and Movement Description: L hemibody appears WFL. R hemibody non functional  due to hemiplegia and moderate hypertone in limbs Motor  Motor Motor: Hemiplegia;Abnormal tone;Abnormal postural alignment and control Motor - Discharge Observations: continued flexor tone in R hemibody, no volitional movement in R hemibody Mobility     Trunk/Postural Assessment  Cervical Assessment Cervical Assessment: Exceptions to [facility] Thoracic Assessment Thoracic Assessment:  Exceptions to [facility] (rounded shoulders) Lumbar Assessment Lumbar Assessment: Exceptions to [facility] Postural Control Postural Control: Deficits on evaluation  Balance Extremity/Trunk Assessment RUE Assessment RUE Assessment: Exceptions to [facility] Active Range of Motion (AROM) Comments: 0 AROM RUE Body System: Neuro Brunstrum levels for arm and hand: Arm;Hand Brunstrum level for arm: Stage I Presynergy Brunstrum level for hand: Stage I Flaccidity RUE Tone RUE Tone: Moderate;Modified Ashworth Body Part - Modified Ashworth Scale: Elbow Elbow - Modified Ashworth Scale for Grading Hypertonia RUE: More marked increase in muscle tone through most of the ROM, but affected part(s) easily moved Modified Ashworth Scale for Grading Hypertonia RUE: More marked increase in muscle tone through most of the ROM, but affected part(s) easily moved LUE Assessment LUE Assessment: Within Functional Limits   Therapy/Group: Individual Therapy  Pollyann Glen [facility] 07/24/2023, 12:15 PM

## 2023-07-24 NOTE — Progress Notes (Signed)
PROGRESS NOTE   Subjective/Complaints:  No issues overnite , no issues per PT in room, has new size 13 shoes to accommodate AFO  ROS: limited due to language/communication    Objective:   No results found. No results for input(s): "WBC", "HGB", "HCT", "PLT" in the last 72 hours.    No results for input(s): "NA", "K", "CL", "CO2", "GLUCOSE", "BUN", "CREATININE", "CALCIUM" in the last 72 hours.     Intake/Output Summary (Last 24 hours) at 07/24/2023 0905 Last data filed at 07/23/2023 1753 Gross per 24 hour  Intake 240 ml  Output --  Net 240 ml        Physical Exam: Vital Signs Blood pressure 122/84, pulse 80, temperature 98.4 F (36.9 C), temperature source Oral, resp. rate 18, height 6\' 1"  (1.854 m), weight 79.8 kg, SpO2 97%.  General: No acute distress Mood and affect are appropriate Heart: Regular rate and rhythm no rubs murmurs or extra sounds Lungs: Clear to auscultation, breathing unlabored, no rales or wheezes Abdomen: Positive bowel sounds, soft nontender to palpation, nondistended Extremities: No clubbing, cyanosis, or edema Skin: No evidence of breakdown, no evidence of rash  Neurologic:  Exp>rec aphasia, responds with head nods and occasionally with words and short phrases. Cranial nerves II through XII grossly intact, motor moving left arm and leg freely MAS 2 R elbow, 2 @ knee.  Musculoskeletal: other than spasticity no joint related problems. No joint swelling  Assessment/Plan: 1. Functional deficits which require 3+ hours per day of interdisciplinary therapy in a comprehensive inpatient rehab setting. Physiatrist is providing close team supervision and 24 hour management of active medical problems listed below. Physiatrist and rehab team continue to assess barriers to discharge/monitor patient progress toward functional and medical goals  Care Tool:  Bathing        Body parts bathed by  helper: Right arm, Left arm, Chest, Abdomen, Front perineal area, Buttocks, Right upper leg, Left upper leg, Face, Left lower leg, Right lower leg     Bathing assist Assist Level: Dependent - Patient 0%     Upper Body Dressing/Undressing Upper body dressing   What is the patient wearing?: Pull over shirt    Upper body assist Assist Level: Total Assistance - Patient < 25%    Lower Body Dressing/Undressing Lower body dressing      What is the patient wearing?: Underwear/pull up, Incontinence brief     Lower body assist Assist for lower body dressing: Dependent - Patient 0%     Toileting Toileting    Toileting assist Assist for toileting: Dependent - Patient 0%     Transfers Chair/bed transfer  Transfers assist  Chair/bed transfer activity did not occur: Safety/medical concerns  Chair/bed transfer assist level: Dependent - mechanical lift (STEDY)     Locomotion Ambulation   Ambulation assist      Assist level: Maximal Assistance - Patient 25 - 49% Assistive device: Other (comment) (L hallway handrail) Max distance: 35 ft   Walk 10 feet activity   Assist     Assist level: Maximal Assistance - Patient 25 - 49% Assistive device: Other (comment) (L hallway hand rail)   Walk 50 feet activity  Assist Walk 50 feet with 2 turns activity did not occur: Safety/medical concerns         Walk 150 feet activity   Assist Walk 150 feet activity did not occur: Safety/medical concerns         Walk 10 feet on uneven surface  activity   Assist Walk 10 feet on uneven surfaces activity did not occur: Safety/medical concerns         Wheelchair     Assist Is the patient using a wheelchair?: Yes Type of Wheelchair: Manual    Wheelchair assist level: Dependent - Patient 0% Max wheelchair distance: 150 ft    Wheelchair 50 feet with 2 turns activity    Assist        Assist Level: Dependent - Patient 0%   Wheelchair 150 feet activity      Assist      Assist Level: Dependent - Patient 0%   Blood pressure 122/84, pulse 80, temperature 98.4 F (36.9 C), temperature source Oral, resp. rate 18, height 6\' 1"  (1.854 m), weight 79.8 kg, SpO2 97%.  Medical Problem List and Plan: 1. 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 Severe Left M1 and V4 stenosis              -patient may  shower             -ELOS/Goals: 7/30 min A for PT, OT and SLP- family training per PT went well   -Continue CIR therapies including PT, OT, and SLP      -Continue AFO right lower extremity  2.  Antithrombotics: -DVT/anticoagulation:  Pharmaceutical: Lovenox, LE doppler neg              -antiplatelet therapy: DAPT for at leasat  3 months. Has declined stenting.   3. Pain Management: Tylenol prn.  4. Mood/Behavior/Sleep: LCSW to follow   for evaluation and support when appropriate.              -antipsychotic agents: N/A 5. Neuropsych/cognition: This patient is not capable of making decisions on his own behalf. 6. Skin/Wound Care: Routine pressure relief measures.   7. Fluids/Electrolytes/Nutrition:   -intake improved  -he's a little dry on labs 7/25---push fluids--recheck BMET Monday 7/29 8. 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 HTN: Monitor BP TID--long term goal normotensive             --continue amlodipine 10 mg/day and coreg  6.25 mg BID.  Vitals:   07/23/23 1944 07/24/23 0523  BP: 125/79 122/84  Pulse: 80 80  Resp: 18 18  Temp: 98.4 F (36.9 C) 98.4 F (36.9 C)  SpO2: 100% 97%  Controlled 7/29  10. T2DM w/peripheral neuropathy: Hgb A1c- 11.7 CBG (last 3)  Recent Labs    07/23/23 1636 07/23/23 2043 07/24/23 0622  GLUCAP 195* 171* 119*    7/23- Added low dose amaryl  7/26 improving control with amaryl-   -reduced semglee to 18u effective 7/25  -amaryl increased to  2mg  daily 7/25   7/29 CBGs appear to be improving, continue current regimen 11. Acute on chronic renal failure?:  Improved with IVF for hydration-->was d/c yesterday but will resume with questionable intake --.SCr 1.02 in 09/22 per chart review. SCr 1.57 @ admission-->1.29-->1.31 PO4 and Mg ok 7/7 Off IVF monitor UO--probably close to baseline  7/26--BUN/Cr 22/1.6 yesterday--pushing fluids  -f/u BMET tomorrow  12.  Severe dysphagia: Continue D1, honey liquids. Willing to take liquids-->will offer thickened Glucerna with meals for now.  7/27 has been on DYS 3 /thin diet, continue to monitor intake         7/28 appears to have good oral intake 100% of most meals 13. Hyperlipidemia: LDL 265. On Lipitor 80 mg daily. 14. Severe vitamin D deficiency: On Ergocalciferol weekly with dialy low supplement.  15. ABLA: Hgb 13.1 @ admission-->11.1-->9.2. Will order stool guaiacs- none collected 7/5.                 Latest Ref Rng & Units 07/17/2023    6:42 AM 07/10/2023    6:58 AM 07/03/2023    9:23 AM  CBC  WBC 4.0 - 10.5 K/uL 6.7  7.9  9.9   Hemoglobin 13.0 - 17.0 g/dL 65.7  9.9  84.6   Hematocrit 39.0 - 52.0 % 29.8  29.9  32.4   Platelets 150 - 400 K/uL 278  338  361    Fluctuates 10-11 g/dL range--stable 9/62  16. Low grade fevers: Encourage pulmonary hygiene.  17. Hx tobacco/Marijuana use: Continue  nicotine patch.  Encourage cessation of Marijuana.  18. Intermittent episodes of N/V: Question due to gastroparesis (Multiple ED visits in the past for intractable N/V). 19. Nasal congestion- ordered Flonase 1 sprays daily.   21.  Post stroke dysphagia,improved now on D3 thin liquids with good intake        Latest Ref Rng & Units 07/20/2023    7:18 AM 07/14/2023    5:59 AM 07/10/2023    6:58 AM  BMP  Glucose 70 - 99 mg/dL 97  952  841   BUN 6 - 20 mg/dL 22  19  18    Creatinine 0.61 - 1.24 mg/dL 3.24  4.01  0.27   Sodium 135 - 145 mmol/L 138  136  134   Potassium 3.5 - 5.1 mmol/L 4.1  4.1  4.2    Chloride 98 - 111 mmol/L 105  102  102   CO2 22 - 32 mmol/L 26  26  25    Calcium 8.9 - 10.3 mg/dL 9.1  8.8  8.9       22.  Spasticity severe elbow flexor tone, may need Botox / Xeomin as outpt , cont ROM    - on Baclofen 15mg  TID and Zanaflex 4mg  TID - may be able to wean after Botox   LOS: 27 days A FACE TO FACE EVALUATION WAS PERFORMED  Erick Colace 07/24/2023, 9:05 AM

## 2023-07-24 NOTE — Plan of Care (Signed)
  Problem: RH Swallowing Goal: LTG Patient will consume least restrictive diet using compensatory strategies with assistance (SLP) Description: LTG:  Patient will consume least restrictive diet using compensatory strategies with assistance (SLP) Outcome: Completed/Met Flowsheets (Taken 06/28/2023 1156 by Ellery Plunk, CCC-SLP) LTG: Pt Patient will consume least restrictive diet using compensatory strategies with assistance of (SLP): Minimal Assistance - Patient > 75% Goal: LTG Patient will participate in dysphagia therapy to increase swallow function with assistance (SLP) Description: LTG:  Patient will participate in dysphagia therapy to increase swallow function with assistance (SLP) Outcome: Completed/Met Flowsheets (Taken 07/24/2023 1540) LTG: Pt will participate in dysphagia therapy to increase swallow function with assistance of (SLP): Minimal Assistance - Patient > 75% Goal: LTG Pt will demonstrate functional change in swallow as evidenced by bedside/clinical objective assessment (SLP) Description: LTG: Patient will demonstrate functional change in swallow as evidenced by bedside/clinical objective assessment (SLP) Outcome: Completed/Met Flowsheets (Taken 07/24/2023 1540) LTG: Patient will demonstrate functional change in swallow as evidenced by bedside/clinical objective assessment: Oropharyngeal swallow   Problem: RH Comprehension Communication Goal: LTG Patient will comprehend basic/complex auditory (SLP) Description: LTG: Patient will comprehend basic/complex auditory information with cues (SLP). Outcome: Completed/Met Flowsheets Taken 07/24/2023 1540 by Renaee Munda, CCC-SLP LTG: Patient will comprehend: Basic auditory information Taken 06/28/2023 1156 by Ellery Plunk, CCC-SLP LTG: Patient will comprehend auditory information with cueing (SLP): Minimal Assistance - Patient > 75%   Problem: RH Expression Communication Goal: LTG Patient will express needs/wants via  multi-modal(SLP) Description: LTG:  Patient will express needs/wants via multi-modal communication (gestures/written, etc) with cues (SLP) Outcome: Completed/Met Flowsheets (Taken 07/24/2023 1540) LTG: Patient will express needs/wants via multimodal communication (gestures/written, etc) with cueing (SLP): Minimal Assistance - Patient > 75% Goal: LTG Patient will verbally express basic/complex needs(SLP) Description: LTG:  Patient will verbally express basic/complex needs, wants or ideas with cues  (SLP) Outcome: Not Met (add Reason) Flowsheets (Taken 07/24/2023 1540) LTG: Patient will verbally express basic/complex needs, wants or ideas (SLP): Moderate Assistance - Patient 50 - 74% Note: Slower than anticipated progress

## 2023-07-24 NOTE — Progress Notes (Signed)
Inpatient Rehabilitation Care Coordinator Discharge Note   Patient Details  Name: Zachary Winters MRN: [id number] Date of Birth: 1981/10/10   Discharge location: Home with sister  Length of Stay: 28 Days  Discharge activity level: Sup/Min A  Home/community participation: Sister  Patient response [id number] Literacy - How often do you need to have someone help you when you read instructions, pamphlets, or other written material from your doctor or pharmacy?: Patient unable to respond  Patient response [id number] Isolation - How often do you feel lonely or isolated from those around you?: Patient unable to respond  Services provided included: SW, Neuropsych, Pharmacy, TR, CM, RN, SLP, OT, PT, RD, MD  Financial Services:  Financial Services Utilized: Medicaid    Choices offered to/list presented to: sister, Shamica  Follow-up services arranged:  Home Health, DME Home Health Agency: [facility]- PT OT SLP    DME : Hospital bed, Wheelchair, Hemi Walker, Transfer Board, Drop arm commode    Patient response to transportation need: Is the patient able to respond to transportation needs?: No       Patient/Family verbalized understanding of follow-up arrangements:  Yes  Individual responsible for coordination of the follow-up plan: Shamica [phone]  Confirmed correct DME delivered: Andria Rhein 07/24/2023    Comments (or additional information):  Summary of Stay    Date/Time Discharge Planning CSW  07/18/23 1504 Patient discharging home with sister, step father and family to assist. Additional family education to be arranged. DME pending. CJB  07/11/23 1429 Discharging home with sister, step father and family to assist. Grandmother providing supervision. Family education 7/20 1-4 PM. Barrier HH: due to insurance. Level entry home. CJB  07/04/23 1108 Discharging home with sister and step father to assist. Grandmother able to provide supervision. 1 level home,  leve; entry. CJB  06/27/23 1338 New admission, assesment pending. CJB       Andria Rhein

## 2023-07-24 NOTE — Progress Notes (Signed)
Speech Language Pathology Discharge Summary  Patient Details  Name: Zachary Winters MRN: [id number] Date of Birth: 02-Nov-1981  Date of Discharge from SLP service:July 24, 2023  Today's Date: 07/24/2023 SLP Individual Time: 1345-1445 SLP Individual Time Calculation (min): 60 min   Skilled Therapeutic Interventions:  Pt was seen in PM to address speech intelligibility, expression, and comprehension. Pt was alert and seated upright in WC upon SLP arrival. He was initially requesting to return to bed c/b pointing at bed multiple times. Pt was agreeable to stay up for remainder of session. SLP addressed following single step directions and naming. Pt named common objects in his environment with 26% acc indep improving to 57% acc given a carrier phrase or syllabic cue. Pt able to write his name and answers to some questions including; his son and sister's name, where he lived, and current location. In additional minutes of session, pt followed single step directions with 56% acc indep improving to 67% acc with min A. At conclusion of session, pt was transferred back to bed with help of NT. Call button placed within reach and bed alarm active.   Patient has met 5 of 6 long term goals.  Patient to discharge at overall Min;Mod level.  Reasons goals not met: slower than antiicpated progress toward goals   Clinical Impression/Discharge Summary: Pt has made steady progress and has met 5 of 6 LTGS this admission due to improved tolerance of Dys 3 and thin liquid diet, use of multimodal communication, and speech intelligibility. Pt is currnetly and overall Min to Mod A for expression and min A for comprehension. He also presents with apraxia and dysarthria. Pt consumes dysphagia 3 solids and thin liquids with min A for swallow precautions. Pt/ family education complete and pt will discharge home with 24 hour supervision from family, friends, etc. Pt would benfit from f/u SLP ervices to maximize language, speech  intelligibility, and swallowing in order to maximize his functional independence.  Care Partner:  Caregiver Able to Provide Assistance: Yes  Type of Caregiver Assistance: Cognitive  Recommendation:  24 hour supervision/assistance;Home Health SLP  Rationale for SLP Follow Up: Maximize functional communication;Maximize cognitive function and independence;Maximize swallowing safety   Equipment: suction cannister   Reasons for discharge: Treatment goals met;Discharged from hospital   Patient/Family Agrees with Progress Made and Goals Achieved: Yes    Renaee Munda 07/24/2023, 3:49 PM

## 2023-07-24 NOTE — Plan of Care (Signed)
  Problem: Consults Goal: RH STROKE PATIENT EDUCATION Description: See Patient Education module for education specifics  Outcome: Progressing   Problem: RH BLADDER ELIMINATION Goal: RH STG MANAGE BLADDER WITH ASSISTANCE Description: STG Manage Bladder With toileting Assistance Outcome: Progressing   Problem: RH KNOWLEDGE DEFICIT Goal: RH STG INCREASE KNOWLEDGE OF HYPERTENSION Description: Patient and family will be able to manage HTN with medications and dietary modifications using educational resources independently Outcome: Progressing

## 2023-07-24 NOTE — Plan of Care (Signed)
Incontinent of bladder w toileting protocol

## 2023-07-24 NOTE — Discharge Summary (Signed)
Physician Discharge Summary  Patient ID: Zachary Winters MRN: [id number] DOB/AGE: 1981-08-07 42 y.o.  Admit date: 06/27/2023 Discharge date: 07/25/2023  Discharge Diagnoses:  Principal Problem:   Acute ischemic left middle cerebral artery (MCA) stroke (HCC) Active Problems:   Hypertension associated with diabetes (HCC)   Type 2 diabetes mellitus with diabetic neuropathy (HCC)   Hyperlipidemia associated with type 2 diabetes mellitus (HCC)   Aphasia   Apraxia   H/O nausea and vomiting   Discharged Condition: stable  Significant Diagnostic Studies: DG Swallowing Func-Speech Pathology  Result Date: 07/04/2023 Table formatting from the original result was not included. Modified Barium Swallow Study Patient Details Name: Zachary Winters MRN: [id number] Date of Birth: 01-12-1981 Today's Date: 07/04/2023 HPI/PMH: HPI: Pt is a 42 year old male who presented with right-sided weakness, right-sided facial droop and difficulty speaking. 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 BSE 6/19 with minimal R anterior spillage, decreased coordination and R anterior spillage; dysphagia 2 diet with thin liquids recommended. 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 Similar petechial hemorrhage without mass occupying acute hemorrhage. New/interval small acute infarct in the right posterior limb of the internal capsule. MBS 6/24: mod/severe oral phase dysphagia and mild/mod pharyngeal phase dysphagia characterized by weak lingual movement and bolus manipulation with labial spillage, oral holding, and spillover to the pharynx. An NPO status recommended with snacks of puree and honey thick liquids. PMH: diabetes mellitus on insulin, chronic daily smoker,  hypertension, history of marijuana use, diabetic peripheral neuropathy, hyperlipidemia Clinical Impression: Pt is a 42 year old male who presented with right-sided weakness, right-sided facial droop and difficulty speaking. 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 BSE 6/19 with minimal R anterior spillage, decreased coordination and R anterior spillage; dysphagia 2 diet with thin liquids recommended. 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 Similar petechial hemorrhage without mass occupying acute hemorrhage. New/interval small acute infarct in the right posterior limb of the internal capsule. MBS 6/24: mod/severe oral phase dysphagia and mild/mod pharyngeal phase dysphagia characterized by weak lingual movement and bolus manipulation with labial spillage, oral holding, and spillover to the pharynx. An NPO status recommended with snacks of puree and honey thick liquids. PMH: diabetes mellitus on insulin, chronic daily smoker, hypertension, history of marijuana use, diabetic peripheral neuropathy, hyperlipidemia   Clinical Impression  Pt presents with a moderate oral dysphagia and a mild pharyngeal dysphagia per MBSS completed today.  Oral deficits characterized by reduced oral strength and coordination, which resulted in anterior spillage of liquids from the R side, incomplete oral transit of liquids and solids, and posterior loss of all liquids to the level of the pyriform sinuses before the swallow. Pt required frequent cues to initiate subsequent dry swallow in effort to reduce moderate amount of oral residue.  Pharyngeal deficits characterized by reduced hyolaryngeal elevation/excursion, reduced base of tongue retraction, reduced larygneal vestibule  closure, reduced pharyngeal stripping, and reduced distention of upper esophageal sphincter.  Findings -There was x1 instance of suspected aspiration of thin liquids after consecutive, large swallows that were pt  initiated by pt. Aspiration inferred due to significant coughing spell once fluoro was turned off. No e/o aspirated residue observed in the trachea once fluoro was turned back on. Suspect aspiration was either ejected or unable to be viewed on fluoro due to minimal amount. -There was inconsistent, trace, transient, shallow penetration of thin liquids by cup and straw sip before/during the swallow. -There was x1 instance of scant, transient, shallow penetration of nectar-thick liquid residue after the swallow. -Cued dry swallows and liquid washes were effective for reduced moderate oral residue and min-mod diffuse pharyngeal residue. Pt, otherwise, did not appeared aware or sensate of this residue.  Detailed diet recommendations and aspiration precautions outlined below. Pt's mother was updated with updated diet recommendations following the study.  Plan: SLP will continue to follow to assess diet tolerance and modify diet as indicated. No data recorded Factors that may increase risk of adverse event in presence of aspiration [name] & [name] 2021): Factors that may increase risk of adverse event in presence of aspiration [name] & [name] 2021): Limited mobility; Dependence for feeding and/or oral hygiene; Inadequate oral hygiene Recommendations/Plan: Swallowing Evaluation Recommendations Swallowing Evaluation Recommendations Recommendations: PO diet PO Diet Recommendation: Dysphagia 2 (Finely chopped); Thin liquids (Level 0) Liquid Administration via: Cup (10cc Provale cup) Medication Administration: Crushed with puree Supervision: Full supervision/cueing for swallowing strategies; Staff to assist with self-feeding Swallowing strategies  : Slow rate; Small bites/sips; Check for pocketing or oral holding;  Check for anterior loss; Multiple dry swallows after each bite/sip; Follow solids with liquids Postural changes: Position pt fully upright for meals; Stay upright 30-60 min after meals Oral care recommendations: Staff/trained caregiver to provide oral care; Oral care before ice chips/water Caregiver Recommendations: Have oral suction available Treatment Plan Treatment Plan Treatment recommendations: Therapy as outlined in treatment plan below Follow-up recommendations: Outpatient SLP Functional status assessment: Patient has not had a recent decline in their functional status. Treatment frequency: Min 3x/week Treatment duration: 2 weeks Interventions: Aspiration precaution training; Patient/family education; Trials of upgraded texture/liquids; Compensatory techniques; Diet toleration management by SLP Recommendations Recommendations for follow up therapy are one component of a multi-disciplinary discharge planning process, led by the attending physician.  Recommendations may be updated based on patient status, additional functional criteria and insurance authorization. Assessment: Orofacial Exam: Orofacial Exam Oral Cavity: Oral Hygiene: Lingual coating (on nystatin) Oral Cavity - Dentition: Adequate natural dentition Orofacial Anatomy: WFL Oral Motor/Sensory Function: Suspected cranial nerve impairment CN V - Trigeminal: Not tested CN VII - Facial: Right motor impairment CN IX - Glossopharyngeal, CN X - Vagus: Right motor impairment CN XII - Hypoglossal: Not tested Anatomy: No data recorded Boluses Administered: Boluses Administered Boluses Administered: Thin liquids (Level 0); Mildly thick liquids (Level 2, nectar thick); Moderately thick liquids (Level 3, honey thick); Puree; Solid  Oral Impairment Domain: Oral Impairment Domain Lip Closure: Escape progressing to mid-chin Tongue control during bolus hold: Posterior escape of greater than half of bolus Bolus preparation/mastication: Slow prolonged chewing/mashing  with complete recollection Bolus transport/lingual motion: Repetitive/disorganized tongue motion Oral residue: Residue collection on oral structures Location of oral residue : Floor of mouth; Tongue Initiation of pharyngeal swallow : Pyriform sinuses  Pharyngeal Impairment Domain: Pharyngeal Impairment Domain Soft palate elevation: No bolus between soft palate (SP)/pharyngeal wall (PW) Laryngeal elevation: Partial superior movement of thyroid cartilage/partial approximation of arytenoids to epiglottic petiole Anterior hyoid excursion: Partial anterior movement Epiglottic movement: Complete inversion Laryngeal vestibule closure: Incomplete, narrow column air/contrast in laryngeal vestibule Pharyngeal stripping wave : Present -  diminished Pharyngeal contraction (A/P view only): N/A Pharyngoesophageal segment opening: Partial distention/partial duration, partial obstruction of flow Tongue base retraction: Trace column of contrast or air between tongue base and PPW Pharyngeal residue: Collection of residue within or on pharyngeal structures Location of pharyngeal residue: Valleculae; Pyriform sinuses; Tongue base  Esophageal Impairment Domain: Esophageal Impairment Domain Esophageal clearance upright position: Complete clearance, esophageal coating Pill: No data recorded Penetration/Aspiration Scale Score: Penetration/Aspiration Scale Score 1.  Material does not enter airway: Puree; Moderately thick liquids (Level 3, honey thick); Thin liquids (Level 0); Mildly thick liquids (Level 2, nectar thick); Solid 2.  Material enters airway, remains ABOVE vocal cords then ejected out: Thin liquids (Level 0); Mildly thick liquids (Level 2, nectar thick) 3.  Material enters airway, remains ABOVE vocal cords and not ejected out: Thin liquids (Level 0) 6.  Material enters airway, passes BELOW cords then ejected out: Thin liquids (Level 0) (suspected based on cough response, though not viewed due to fluro off) Compensatory  Strategies: Compensatory Strategies Compensatory strategies: Yes Multiple swallows: Effective Effective Multiple Swallows: Thin liquid (Level 0); Mildly thick liquid (Level 2, nectar thick); Moderately thick liquid (Level 3, honey thick) (reduced residue) Liquid wash: Effective Effective Liquid Wash: Puree; Solid (reduced residue)   General Information: Caregiver present: No (SLP provided education with pt's mom at bedside)  Diet Prior to this Study: Dysphagia 1 (pureed); Moderately thick liquids (Level 3, honey thick)   Temperature : Normal   Respiratory Status: WFL   Supplemental O2: None (Room air)   History of Recent Intubation: No  Behavior/Cognition: Alert; Pleasant mood; Requires cueing; Impulsive Self-Feeding Abilities: Needs assist with self-feeding Baseline vocal quality/speech: Not observed Volitional Cough: Unable to elicit Volitional Swallow: Able to elicit (delayed with multiple cues) Exam Limitations: -- (fluro camera off during possible aspiration event) Goal Planning: Prognosis for improved oropharyngeal function: Fair Barriers to Reach Goals: Language deficits; Severity of deficits No data recorded Patient/Family Stated Goal: none stated Consulted and agree with results and recommendations: Nurse; Family member/caregiver; Nurse Tech Pain: Pain Assessment Pain Assessment: No/denies pain Faces Pain Scale: 0 SLP visit diagnosis: SLP Visit Diagnosis: Dysphagia, oropharyngeal phase (R13.12); Aphasia (R47.01); Dysarthria and anarthria (R47.1) Past Medical History: Past Medical History: Diagnosis Date  Nausea and vomiting   chronic for years.  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 EXAM: CHEST - 2 VIEW COMPARISON:  June 27, 2023 FINDINGS: The heart size and mediastinal contours are within normal limits. Both lungs are clear. The visualized skeletal structures are unremarkable. IMPRESSION: No active cardiopulmonary disease. Electronically Signed   By: Aram Candela M.D.   On: 07/03/2023 20:14   DG Abd Portable 1V  Result Date: 06/30/2023 CLINICAL DATA:  Evaluate feeding tube placement. EXAM: PORTABLE ABDOMEN - 1 VIEW COMPARISON:  06/27/2023 FINDINGS: The feeding tube tip is below the GE junction in the expected location of the distal body of stomach. Enteric contrast material is again seen within the colon. No dilated  bowel loops. IMPRESSION: Feeding tube tip in the distal body of the stomach. Electronically Signed   By: Signa Kell M.D.   On: 06/30/2023 11:04   VAS [place] LOWER EXTREMITY VENOUS (DVT)  Result Date: 06/28/2023  Lower Venous DVT Study Patient Name:  Zachary Winters  Date of Exam:   06/28/2023 Medical Rec #: [id number]        Accession #:    [id number] Date of Birth: [birth year]        Patient Gender: M Patient Age:   58 years Exam Location:  [facility] Procedure:      VAS [place] LOWER EXTREMITY VENOUS (DVT) Referring Phys:   --------------------------------------------------------------------------------  Indications: Stroke.  Limitations: Poor ultrasound/tissue interface. Comparison Study: No previous exams Performing Technologist: Jody Hill RVT, RDMS  Examination Guidelines: A complete evaluation includes B-mode imaging, spectral Doppler, color Doppler, and power Doppler as needed of all accessible portions of each vessel. Bilateral testing is considered an integral part of a complete examination. Limited examinations for  reoccurring indications may be performed as noted. The reflux portion of the exam is performed with the patient in reverse Trendelenburg.  +---------+---------------+---------+-----------+----------+--------------+ RIGHT    CompressibilityPhasicitySpontaneityPropertiesThrombus Aging +---------+---------------+---------+-----------+----------+--------------+ CFV      Full           Yes      Yes                                 +---------+---------------+---------+-----------+----------+--------------+ SFJ      Full                                                        +---------+---------------+---------+-----------+----------+--------------+ FV Prox  Full           Yes      Yes                                 +---------+---------------+---------+-----------+----------+--------------+ FV Mid   Full           Yes      Yes                                 +---------+---------------+---------+-----------+----------+--------------+ FV DistalFull           Yes      Yes                                 +---------+---------------+---------+-----------+----------+--------------+ PFV      Full                                                        +---------+---------------+---------+-----------+----------+--------------+ POP      Full           Yes      Yes                                 +---------+---------------+---------+-----------+----------+--------------+  PTV      Full                                                        +---------+---------------+---------+-----------+----------+--------------+ PERO     Full                                                        +---------+---------------+---------+-----------+----------+--------------+   +--------+---------------+---------+-----------+----------+--------------------+ LEFT    CompressibilityPhasicitySpontaneityPropertiesThrombus Aging        +--------+---------------+---------+-----------+----------+--------------------+ CFV     Full           Yes      Yes                                       +--------+---------------+---------+-----------+----------+--------------------+ SFJ     Full                                                              +--------+---------------+---------+-----------+----------+--------------------+ FV Prox Full           Yes      Yes                                       +--------+---------------+---------+-----------+----------+--------------------+ FV Mid  Full           Yes      Yes                                       +--------+---------------+---------+-----------+----------+--------------------+ FV      Full           Yes      Yes                                       Distal                                                                    +--------+---------------+---------+-----------+----------+--------------------+ PFV                    Yes      Yes                  patent by  color/doppler        +--------+---------------+---------+-----------+----------+--------------------+ POP     Full           Yes      Yes                                       +--------+---------------+---------+-----------+----------+--------------------+ PTV     Full                                                              +--------+---------------+---------+-----------+----------+--------------------+ PERO    Full                                                              +--------+---------------+---------+-----------+----------+--------------------+     Summary: BILATERAL: - No evidence of deep vein thrombosis seen in the lower extremities, bilaterally. -No evidence of popliteal cyst, bilaterally.   *See table(s) above for measurements and observations. Electronically signed by Coral Else  MD on 06/28/2023 at 9:18:11 PM.    Final    DG Chest 2 View  Result Date: 06/27/2023 CLINICAL DATA:  Emesis EXAM: CHEST - 2 VIEW COMPARISON:  06/15/2023 CT FINDINGS: Cardiac shadow is mildly enlarged. Lungs are well aerated bilaterally. No focal infiltrate or effusion is seen. Contrast is noted within the colon related to prior barium swallow. No bony abnormality is seen. IMPRESSION: No active cardiopulmonary disease. Electronically Signed   By: Alcide Clever M.D.   On: 06/27/2023 20:12   DG Abd Portable 1V  Result Date: 06/27/2023 CLINICAL DATA:  Constipation EXAM: PORTABLE ABDOMEN - 1 VIEW COMPARISON:  06/21/2023 FINDINGS: Contrast within the colon and rectum is identified. No dilated bowel loops are noted. No significant stool burden identified. No other significant abnormalities noted. IMPRESSION: No evidence of bowel obstruction or significant stool burden. Electronically Signed   By: Harmon Pier M.D.   On: 06/27/2023 13:17    Labs:  Basic Metabolic Panel:    Latest Ref Rng & Units 07/24/2023    9:00 AM 07/20/2023    7:18 AM 07/14/2023    5:59 AM  BMP  Glucose 70 - 99 mg/dL 841  97  324   BUN 6 - 20 mg/dL 20  22  19    Creatinine 0.61 - 1.24 mg/dL 4.01  0.27  2.53   Sodium 135 - 145 mmol/L 136  138  136   Potassium 3.5 - 5.1 mmol/L 4.6  4.1  4.1   Chloride 98 - 111 mmol/L 99  105  102   CO2 22 - 32 mmol/L 29  26  26    Calcium 8.9 - 10.3 mg/dL 9.3  9.1  8.8      CBC:    Latest Ref Rng & Units 07/24/2023    9:00 AM 07/17/2023    6:42 AM 07/10/2023    6:58 AM  CBC  WBC 4.0 - 10.5 K/uL 7.7  6.7  7.9   Hemoglobin 13.0 - 17.0 g/dL 66.4  40.3  9.9   Hematocrit 39.0 - 52.0 %  33.4  29.8  29.9   Platelets 150 - 400 K/uL 328  278  338      CBG: Recent Labs  Lab 07/24/23 0622 07/24/23 1216 07/24/23 1645 07/24/23 2108 07/25/23 0550  GLUCAP 119* 153* 129* 130* 86    Brief HPI:   Zachary Winters is a 42 y.o. male with history of poorly controlled DM with neuropathy, HTN, tobacco use who  was admitted to [facility] on [facility]/14/2023 with decreased responsiveness x 2 days.  He was found by sister with right-sided weakness, facial droop with inability to speak as well as reports of being on floor 2 days prior to admission with elevated blood sugars and then was fatigued and lying around.  UDS positive for THC.  He was out of window for TNK.  MRI/MRI brain done showing acute to subacute perforator infarct left basal ganglia with underlying severe stenosis versus segmental occlusion of left M1 segment, left ICA noted to be smaller than right and neck atrial imaging recommended to rule out proximal stenosis.  Carotid Dopplers done which were negative for significant ICA stenosis.  TEE done showing EF of 55 to 60% with normal LVEF and moderate concentric LVH, tricuspid AV and was negative for thrombus or ideation.  Hospital course was significant for decreased energy levels as well as emesis and poor p.o. intake.    Dr. Melynda Ripple recommended DAPT x 3 months stroke was felt to be likely due to M1 occlusion.  CT chest, abdomen pelvis ordered to rule out occult malignancy and was negative for this but showed advanced 3 vessel coronary artery calcification, atherosclerotic calcification in aorta and iliac arteries as well as concerns of chronic atypical infection.  Hypercoagulopathy panel was negative.  Plans for left MCA stenting however procedure postponed pending reasonable improvement after CIR.  Core track was placed for nutritional support due to issues with coughing and difficulty managing secretion.  MBS was repeated on 06/28 and he was started on dysphagia 1 with honey liquids.  Ergocalciferol added due to vitamin D deficiency.  His mentation was improving with increase ability to produce sounds, blood sugars were noted to be poorly controlled, he was found to have core track with vomitus in mouth therefore this was DC'd prior to discharge.  Intake was poor as he was refusing pured foods.  Therapy was working  with patient was limited by right hemiplegia with left inattention, expressive/receptive aphasia, emerging spasticity and was working on pre-gait activity.  CIR was recommended due to functional decline.   Hospital Course: Zachary Winters was admitted to rehab 06/27/2023 for inpatient therapies to consist of PT, ST and OT at least three hours five days a week. Past admission physiatrist, therapy team and rehab RN have worked together to provide customized collaborative inpatient rehab.  Chest x-ray was done at admission and was NAD.  He was noted to have drop in hemoglobin from 11.1 to 9.2 therefore stool guaiacs ordered.  This was negative x 2 with follow-up CBC showing improvement in hemoglobin to 10.9.  He was maintained on DAPT throughout his stay and is tolerating this without side effects.  BLE Dopplers done past admission and were negative for DVT.  He was has also had issues with constipation and bowel program has been augmented during his stay.  Nicotine patch added due to history of tobacco abuse.  Blood pressures were monitored on TID basis and noted to be labile therefore Coreg and amlodipine was titrated upwards for better control.  Due to  ongoing dislike of pured diet and poor intake NG tube was placed for nutritional support.  His diabetes has been monitored with ac/hs CBG checks and SSI was use prn for tighter BS control.  As diet advanced, his intake improved and tube feeds were discontinued.  He is currently tolerating D3 diet with thin liquids without signs or symptoms of aspiration.  Follow-up check of electrolytes shows hyponatremia has resolved and SCr at 1.5-1.6 range likely due to chronic kidney disease.  Insulin glargine has been titrated during his stay and blood sugars are well-controlled.  Voiding was monitored and he required intermittent cath question in part due to cognitive issues.  Urecholine and Flomax were added with improvement in voiding function.  He developed flexor tone  with spasticity requiring addition and up titration of baclofen as well as Zanaflex.  He is tolerating this without side effects.    He has made gains during his rehab stay however continues to be limited by left lean with pushing tendencies, dense right hemiplegia with flexor tone as well as aphasia.  He will continue to receive follow-up home health PT, OT and SLP by [facility] home health after discharge.  Family did indicate that patient has been noncompliant in regards to medical follow-up and prior PCP office was contacted to set up posthospital follow-up as patient had not been seen for 2 years.  Appointment set for August 26 and patient and family were advised on importance of this appointment to set up with PCP and for medication refills.  1 month supply of medications filled by T[facility].  Patient was discharged to home on 07/25/2023   Rehab course: During patient's stay in rehab weekly team conferences were held to monitor patient's progress, set goals and discuss barriers to discharge. At admission, patient required total assist with mobility and with ADL tasks. He exhibited mild to moderate oropharyngeal dysphagia with anterior spillage, nonfluent expressive and receptive aphasia with concerns of apraxia and or dysarthria. He  has had improvement in activity tolerance, balance, postural control as well as ability to compensate for deficits. He has had improvement in functional use RUE  and RLE as well as improvement in awareness.  He requires min assist for transfers and max assist to ambulate 75 feet with hemiwalker and tactile cues for sequencing, weightbearing and manual facilitation for foot placement.  He is tolerating advancement of diet to D3 thin liquids.  He requires min to mod assist for expression and min assist for comprehension and requires multimodal cues for communication and speech intelligibility.  He continues to present with apraxia and dysarthria and requires min assist for safe  swallow strategies.  Family education has been completed.   Discharge disposition: 06-Home-Health Care Svc  Diet: Carb modifed/Heart health  Special Instructions: Monitor BS ac/hs and take record for evaluation by PCP. Family to administer/mange medication 3.  Blood pressure goal at 130-150 due to ICA stenosis.    Allergies as of 07/25/2023       Reactions   Chocolate    vomiting   Metformin Nausea And Vomiting   Penicillins Swelling        Medication List     STOP taking these medications    insulin glargine-yfgn 100 UNIT/ML injection Commonly known as: SEMGLEE   Insulin Syringe-Needle U-100 31G X 5/16" 0.3 ML Misc Commonly known as: GNP Insulin Syringes 31Gx5/16"       TAKE these medications    acetaminophen 325 MG tablet Commonly known as: TYLENOL Take 1-2 tablets (  325-650 mg total) by mouth every 4 (four) hours as needed for mild pain.   amLODipine 10 MG tablet Commonly known as: NORVASC Take 1 tablet (10 mg total) by mouth daily.   aspirin 81 MG chewable tablet Place 1 tablet (81 mg total) into feeding tube daily.   atorvastatin 80 MG tablet Commonly known as: LIPITOR Take 1 tablet (80 mg total) by mouth daily after supper. What changed: when to take this   baclofen 10 MG tablet Commonly known as: LIORESAL Take 1.5 tablets (15 mg total) by mouth 3 (three) times daily.   BD Pen Needle Nano U/F 32G X 4 MM Misc Generic drug: Insulin Pen Needle Use at bedtime as directed   bethanechol 10 MG tablet Commonly known as: URECHOLINE Take 1 tablet (10 mg total) by mouth 4 (four) times daily.   bisacodyl 10 MG suppository Commonly known as: DULCOLAX Unwrap and place 1 suppository (10 mg total) rectally daily as needed for moderate constipation.   blood glucose meter kit and supplies Dispense based on patient and insurance preference. Check blood sugar four times daily as directed. (FOR ICD-10 E10.9, E11.9).   carvedilol 6.25 MG tablet Commonly known  as: COREG Take 1 tablet (6.25 mg total) by mouth 2 (two) times daily with a meal.   CertaVite/Antioxidants Tabs Take 1 tablet by mouth daily.   clopidogrel 75 MG tablet Commonly known as: PLAVIX Take 1 tablet (75 mg total) by mouth daily. What changed: how to take this   fluticasone 50 MCG/ACT nasal spray Commonly known as: FLONASE Place 2 sprays into both nostrils daily.   glimepiride 2 MG tablet Commonly known as: AMARYL Take 1 tablet (2 mg total) by mouth daily with breakfast.   Lantus SoloStar 100 UNIT/ML Solostar Pen Generic drug: insulin glargine Inject 18 Units into the skin at bedtime.   nicotine 21 mg/24hr patch Commonly known as: NICODERM CQ - dosed in mg/24 hours Place 1 patch (21 mg total) onto the skin daily. Notes to patient: Change daily--sign up with [employer] for free supplies   Senexon-S 8.6-50 MG tablet Generic drug: senna-docusate Take 1 tablet by mouth at bedtime. What changed:  when to take this reasons to take this   tamsulosin 0.4 MG Caps capsule Commonly known as: FLOMAX Take 1 capsule (0.4 mg total) by mouth daily after supper.   tiZANidine 4 MG tablet Commonly known as: ZANAFLEX Take 1 tablet (4 mg total) by mouth 3 (three) times daily.   Vitamin D (Ergocalciferol) 1.25 MG (50000 UNIT) Caps capsule Commonly known as: DRISDOL Take 1 capsule (50,000 Units total) by mouth every 7 (seven) days. On Wednesdays What changed: additional instructions        Follow-up Information     Kirsteins, Victorino Sparrow, MD Follow up.   Specialty: Physical Medicine and Rehabilitation Why: office will call you with follow up appointment Contact information: [address] [place] [address] [phone]         [facility] Follow up.   Contact information: [address]     [address] [place] [address] [phone]        [facility] Follow up.   Why: appointment at 8/27 at 9:20  am.  Contact information: [address] [place] [address] [phone]                Signed: Jacquelynn Cree 07/26/2023, 11:22 PM

## 2023-07-24 NOTE — Progress Notes (Signed)
Occupational Therapy Discharge Summary  Patient Details  Name: Zachary Winters MRN: [id number] Date of Birth: 08/10/1981  Date of Discharge from OT service:July 24, 2023  Today's Date: 07/24/2023  Patient has met 11 of 12 long term goals due to {due to:3041651}.  Patient to discharge at overall {LOA:3049010} level.  Patient's care partner {care partner:3041650} to provide the necessary {assistance:3041652} assistance at discharge.  Pt to DC home with sister, sister was present for family education and training and demonstrated competency with transfers and verabalized understanding of level assist needed for ADLs. Pt currently completes ADLs with MIN - MODA, basic squat pivot transfers with MINA, MOD A for transfers to [facility], MAX A for 3/3 toileting tasks and MIN A for bathing from shower level or EOB. Pts sister was present for family education and demonstrated competency for transfers and ADL assist.   Reasons goals not met: pt requires more of MODA for toilet transfers to [facility] d/t impaired sitting balance and motor planning especially when transferring to R side.   Recommendation:  Patient will benefit from ongoing skilled OT services in {setting:3041680} to continue to advance functional skills in the area of {ADL/iADL:3041649}.  Equipment: DABSC  Reasons for discharge: {Reason for discharge:3049018}  Patient/family agrees with progress made and goals achieved: {Pt/Family agree with progress/goals:3049020}  OT Discharge  ADL ADL Eating: Supervision/safety, Set up Where Assessed-Eating: Chair Grooming: Setup Where Assessed-Grooming: Sitting at sink Upper Body Bathing: Supervision/safety Where Assessed-Upper Body Bathing: Shower Lower Body Bathing: Minimal assistance Where Assessed-Lower Body Bathing: Shower Upper Body Dressing: Minimal assistance, Moderate cueing Where Assessed-Upper Body Dressing: Wheelchair Lower Body Dressing: Moderate assistance Where Assessed-Lower Body  Dressing: Wheelchair Toileting: Maximal assistance Where Assessed-Toileting: Bedside Commode, Toilet Toilet Transfer: Moderate assistance Toilet Transfer Method: Squat pivot Toilet Transfer Equipment: Bedside commode, Other (comment) (drop arm) Film/video editor: Moderate assistance, Moderate cueing Film/video editor Method: Administrator: Emergency planning/management officer, Grab bars Vision Baseline Vision/History: 5 Retinopathy (per report from family) Patient Visual Report: No change from baseline Perception  Perception: Impaired Praxis Praxis: Impaired Praxis Impairment Details: Motor planning Cognition Cognition Overall Cognitive Status: Difficult to assess Arousal/Alertness: Awake/alert Brief Interview for Mental Status (BIMS) Repetition of Three Words (First Attempt): No answer Temporal Orientation: Year: No answer Temporal Orientation: Month: No answer Temporal Orientation: Day: Incorrect Recall: "Sock": No, could not recall Recall: "Blue": No, could not recall Recall: "Bed": No, could not recall BIMS Summary Score: 0 Sensation Sensation Light Touch: Impaired by gross assessment Hot/Cold: Not tested Proprioception: Impaired by gross assessment Stereognosis: Not tested Additional Comments: assessment limited d/t aphasia but does report sensation in RUE but reports it does not feel "normal" Coordination Gross Motor Movements are Fluid and Coordinated: No Fine Motor Movements are Fluid and Coordinated: No Coordination and Movement Description: L hemibody appears WFL. R hemibody non functional due to hemiplegia and moderate hypertone in limbs Motor  Motor Motor: Hemiplegia;Abnormal tone;Abnormal postural alignment and control Motor - Discharge Observations: continued flexor tone in R hemibody, no volitional movement in R hemibody Mobility  Bed Mobility Bed Mobility: Supine to Sit Supine to Sit: Minimal Assistance - Patient > 75% Transfers Sit  to Stand: Minimal Assistance - Patient > 75% (at bed rail) Stand to Sit: Minimal Assistance - Patient > 75% (at bed rail)  Trunk/Postural Assessment  Cervical Assessment Cervical Assessment: Exceptions to [facility] Thoracic Assessment Thoracic Assessment: Exceptions to [facility] (rounded shoulders) Lumbar Assessment Lumbar Assessment: Exceptions to [facility] Postural Control Postural Control: Deficits on evaluation  Balance Static Sitting Balance Static Sitting - Balance Support: Left upper extremity supported Static Sitting - Level of Assistance: 5: Stand by assistance (supervision) Dynamic Sitting Balance Dynamic Sitting - Balance Support: Left upper extremity supported Dynamic Sitting - Level of Assistance: 4: Min assist Static Standing Balance Static Standing - Balance Support: Left upper extremity supported Static Standing - Level of Assistance: 4: Min assist Dynamic Standing Balance Dynamic Standing - Balance Support: Left upper extremity supported Dynamic Standing - Level of Assistance: 3: Mod assist Extremity/Trunk Assessment RUE Assessment RUE Assessment: Exceptions to [facility] Active Range of Motion (AROM) Comments: 0 AROM RUE Body System: Neuro Brunstrum levels for arm and hand: Arm;Hand Brunstrum level for arm: Stage I Presynergy Brunstrum level for hand: Stage I Flaccidity RUE Tone RUE Tone: Moderate;Modified Ashworth Body Part - Modified Ashworth Scale: Elbow Elbow - Modified Ashworth Scale for Grading Hypertonia RUE: More marked increase in muscle tone through most of the ROM, but affected part(s) easily moved Modified Ashworth Scale for Grading Hypertonia RUE: More marked increase in muscle tone through most of the ROM, but affected part(s) easily moved LUE Assessment LUE Assessment: Within Functional Limits   [name] 07/24/2023, 7:24 AM

## 2023-07-25 ENCOUNTER — Other Ambulatory Visit (HOSPITAL_COMMUNITY): Payer: Self-pay

## 2023-07-25 LAB — GLUCOSE, CAPILLARY: Glucose-Capillary: 86 mg/dL (ref 70–99)

## 2023-07-25 MED ORDER — ASPIRIN 81 MG PO CHEW
81.0000 mg | CHEWABLE_TABLET | Freq: Every day | ORAL | 0 refills | Status: DC
  Filled 2023-07-25: qty 90, 90d supply, fill #0
  Filled 2023-08-07: qty 100, 100d supply, fill #0

## 2023-07-25 NOTE — Progress Notes (Signed)
PROGRESS NOTE   Subjective/Complaints:  Grandmother at bedside , awaiting pt's sister arrival   ROS: limited due to language/communication    Objective:   No results found. Recent Labs    07/24/23 0900  WBC 7.7  HGB 10.9*  HCT 33.4*  PLT 328      Recent Labs    07/24/23 0900  NA 136  K 4.6  CL 99  CO2 29  GLUCOSE 182*  BUN 20  CREATININE 1.61*  CALCIUM 9.3       Intake/Output Summary (Last 24 hours) at 07/25/2023 0947 Last data filed at 07/25/2023 0758 Gross per 24 hour  Intake 591 ml  Output --  Net 591 ml        Physical Exam: Vital Signs Blood pressure 123/86, pulse 90, temperature 98.5 F (36.9 C), temperature source Oral, resp. rate 18, height 6\' 1"  (1.854 m), weight 79.9 kg, SpO2 98%.  General: No acute distress Mood and affect are appropriate Heart: Regular rate and rhythm no rubs murmurs or extra sounds Lungs: Clear to auscultation, breathing unlabored, no rales or wheezes Abdomen: Positive bowel sounds, soft nontender to palpation, nondistended Extremities: No clubbing, cyanosis, or edema Skin: No evidence of breakdown, no evidence of rash  Neurologic:  Exp>rec aphasia, responds with head nods and occasionally with words and short phrases. Cranial nerves II through XII grossly intact, motor moving left arm and leg freely MAS 2 R elbow, 3 @ knee flexors.  Musculoskeletal: other than spasticity no joint related problems. No joint swelling  Assessment/Plan: 1. Functional deficits due to Left MCA infarct  Stable for D/C today F/u PCP in 3-4 weeks F/u PM&R 2 weeks See D/C summary See D/C instructions   Care Tool:  Bathing        Body parts bathed by helper: Right arm, Left arm, Chest, Abdomen, Front perineal area, Buttocks, Right upper leg, Left upper leg, Face, Left lower leg, Right lower leg     Bathing assist Assist Level: Minimal Assistance - Patient > 75%     Upper  Body Dressing/Undressing Upper body dressing   What is the patient wearing?: Pull over shirt    Upper body assist Assist Level: Minimal Assistance - Patient > 75%    Lower Body Dressing/Undressing Lower body dressing      What is the patient wearing?: Underwear/pull up, Incontinence brief     Lower body assist Assist for lower body dressing: Moderate Assistance - Patient 50 - 74%     Toileting Toileting    Toileting assist Assist for toileting: Maximal Assistance - Patient 25 - 49%     Transfers Chair/bed transfer  Transfers assist  Chair/bed transfer activity did not occur: Safety/medical concerns  Chair/bed transfer assist level: Minimal Assistance - Patient > 75% (squat pivots)     Locomotion Ambulation   Ambulation assist      Assist level: Maximal Assistance - Patient 25 - 49% Assistive device: Other (comment) (L hallway handrail) Max distance: 35 ft   Walk 10 feet activity   Assist     Assist level: Maximal Assistance - Patient 25 - 49% Assistive device: Other (comment) (L hallway hand rail)   Walk  50 feet activity   Assist Walk 50 feet with 2 turns activity did not occur: Safety/medical concerns         Walk 150 feet activity   Assist Walk 150 feet activity did not occur: Safety/medical concerns         Walk 10 feet on uneven surface  activity   Assist Walk 10 feet on uneven surfaces activity did not occur: Safety/medical concerns         Wheelchair     Assist Is the patient using a wheelchair?: Yes Type of Wheelchair: Manual    Wheelchair assist level: Dependent - Patient 0% Max wheelchair distance: 150 ft    Wheelchair 50 feet with 2 turns activity    Assist        Assist Level: Dependent - Patient 0%   Wheelchair 150 feet activity     Assist      Assist Level: Dependent - Patient 0%   Blood pressure 123/86, pulse 90, temperature 98.5 F (36.9 C), temperature source Oral, resp. rate 18,  height 6\' 1"  (1.854 m), weight 79.9 kg, SpO2 98%.  Medical Problem List and Plan: 1. 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 Severe Left M1 and V4 stenosis              -patient may  shower             -ELOS/Goals: 7/30 min A for PT, OT and SLP- family training per PT went well   -Continue CIR therapies including PT, OT, and SLP      -Continue AFO right lower extremity  2.  Antithrombotics: -DVT/anticoagulation:  Pharmaceutical: Lovenox, LE doppler neg              -antiplatelet therapy: DAPT for at leasat  3 months. Has declined stenting.   3. Pain Management: Tylenol prn.  4. Mood/Behavior/Sleep: LCSW to follow   for evaluation and support when appropriate.              -antipsychotic agents: N/A 5. Neuropsych/cognition: This patient is not capable of making decisions on his own behalf. 6. Skin/Wound Care: Routine pressure relief measures.   7. Fluids/Electrolytes/Nutrition:   -intake improved  -he's a little dry on labs 7/25---push fluids--recheck BMET Monday 7/29 8. 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 HTN: Monitor BP TID--long term goal normotensive             --continue amlodipine 10 mg/day and coreg  6.25 mg BID.  Vitals:   07/25/23 0327 07/25/23 0553  BP: 127/82 123/86  Pulse: 80 90  Resp: 18 18  Temp: 99.3 F (37.4 C) 98.5 F (36.9 C)  SpO2: 99% 98%  Controlled 7/29  10. T2DM w/peripheral neuropathy: Hgb A1c- 11.7 CBG (last 3)  Recent Labs    07/24/23 1645 07/24/23 2108 07/25/23 0550  GLUCAP 129* 130* 86    7/23- Added low dose amaryl  7/26 improving control with amaryl-   -reduced semglee to 18u effective 7/25  -amaryl increased to 2mg  daily 7/25   7/29 CBGs appear to be improving, continue current regimen 11. Acute on chronic renal failure?:  Improved with IVF for hydration-->was d/c yesterday  but will resume with questionable intake --.SCr 1.02 in 09/22 per chart review. SCr 1.57 @ admission-->1.29-->1.31 PO4 and Mg ok 7/7 Off IVF monitor UO--probably close to baseline  7/26--BUN/Cr 22/1.6 yesterday--pushing fluids  -  f/u BMET tomorrow  12. Severe dysphagia: Continue D1, honey liquids. Willing to take liquids-->will offer thickened Glucerna with meals for now.  7/27 has been on DYS 3 /thin diet, continue to monitor intake         7/28 appears to have good oral intake 100% of most meals 13. Hyperlipidemia: LDL 265. On Lipitor 80 mg daily. 14. Severe vitamin D deficiency: On Ergocalciferol weekly with dialy low supplement.  15. ABLA: Hgb 13.1 @ admission-->11.1-->9.2. Will order stool guaiacs- none collected 7/5.                 Latest Ref Rng & Units 07/24/2023    9:00 AM 07/17/2023    6:42 AM 07/10/2023    6:58 AM  CBC  WBC 4.0 - 10.5 K/uL 7.7  6.7  7.9   Hemoglobin 13.0 - 17.0 g/dL 95.6  21.3  9.9   Hematocrit 39.0 - 52.0 % 33.4  29.8  29.9   Platelets 150 - 400 K/uL 328  278  338    Fluctuates 10-11 g/dL range--stable 0/86  16. Low grade fevers: Encourage pulmonary hygiene.  17. Hx tobacco/Marijuana use: Continue  nicotine patch.  Encourage cessation of Marijuana.  18. Intermittent episodes of N/V: Question due to gastroparesis (Multiple ED visits in the past for intractable N/V). 19. Nasal congestion- ordered Flonase 1 sprays daily.   21.  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  Spasticity severe elbow flexor tone,  may need Botox / Xeomin as outpt , cont ROM    - on Baclofen 15mg  TID and Zanaflex 4mg  TID - may be able to wean after Botox   LOS: 28 days A FACE TO FACE EVALUATION WAS PERFORMED  Erick Colace 07/25/2023, 9:47 AM

## 2023-07-25 NOTE — Progress Notes (Signed)
Inpatient Rehabilitation Discharge Medication Review by a Pharmacist  A complete drug regimen review was completed for this patient to identify any potential clinically significant medication issues.  High Risk Drug Classes Is patient taking? Indication by Medication  Antipsychotic No   Anticoagulant No   Antibiotic No   Opioid No   Antiplatelet Yes Aspirin, Plavix: CVA prophylaxis  Hypoglycemics/insulin Yes Glimepiride, Lantus: diabetes  Vasoactive Medication Yes Amlodipine, coreg: hypertension  Chemotherapy No   Other Yes Tylenol: pain Lipitor: hyperlipidemia Nicoderm: nicotine dependence Bisacodyl, Senokot-S: constipation Bethanechol, Flomax: urinary retention Baclofen, zanaflex: spasticity MVI, vitD: vitamin/supplement Flonase: nasal congestion     Type of Medication Issue Identified Description of Issue Recommendation(s)  Drug Interaction(s) (clinically significant)     Duplicate Therapy     Allergy     No Medication Administration End Date     Incorrect Dose     Additional Drug Therapy Needed     Significant med changes from prior encounter (inform family/care partners about these prior to discharge).  Communicate medication changes with patient/family at discharge  Other        Time spent performing this drug regimen review (minutes): 30   Thank you for allowing pharmacy to be a part of this patient's care.   Signe Colt, PharmD 07/25/2023 8:18 AM    **Pharmacist phone directory can be found on [email or web address] listed under [facility]**

## 2023-07-25 NOTE — Plan of Care (Signed)
  Problem: RH Balance Goal: LTG Patient will maintain dynamic sitting balance (PT) Description: LTG:  Patient will maintain dynamic sitting balance with assistance during mobility activities (PT) Outcome: Adequate for Discharge Flowsheets (Taken 07/24/2023 2305) LTG: Pt will maintain dynamic sitting balance during mobility activities with:: Minimal Assistance - Patient > 75% Note: Is supervision with steady L hand hold or static sitting. Can require up to intermittent MinA to maintain balance with R lean.    Problem: RH Bed Mobility Goal: LTG Patient will perform bed mobility with assist (PT) Description: LTG: Patient will perform bed mobility with assistance, with/without cues (PT). Outcome: Adequate for Discharge Flowsheets (Taken 07/24/2023 2305) LTG: Pt will perform bed mobility with assistance level of: Minimal Assistance - Patient > 75%   Problem: RH Balance Goal: LTG Patient will maintain dynamic standing balance (PT) Description: LTG:  Patient will maintain dynamic standing balance with assistance during mobility activities (PT) Outcome: Not Met (add Reason) Flowsheets (Taken 07/24/2023 2305) LTG: Pt will maintain dynamic standing balance during mobility activities with:: Maximal Assistance - Patient 25 - 49% Note: Minimal progression in awareness of L hemibody ability and attempts to stand over BLE with continued R hemibody hemiplegia.    Problem: Sit to Stand Goal: LTG:  Patient will perform sit to stand with assistance level (PT) Description: LTG:  Patient will perform sit to stand with assistance level (PT) Outcome: Completed/Met Flowsheets (Taken 07/17/2023 2244) LTG: PT will perform sit to stand in preparation for functional mobility with assistance level: Moderate Assistance - Patient 50 - 74% Note: Requires steady LUE hand hold for MinA/ ModA. Otherwise continues to require MaxA.    Problem: RH Bed to Chair Transfers Goal: LTG Patient will perform bed/chair transfers  w/assist (PT) Description: LTG: Patient will perform bed to chair transfers with assistance (PT). Outcome: Completed/Met Flowsheets (Taken 06/28/2023 1725) LTG: Pt will perform Bed to Chair Transfers with assistance level: Minimal Assistance - Patient > 75% Note: Either squat pivot in either direction and max cueing or using slide board to L side.    Problem: RH Car Transfers Goal: LTG Patient will perform car transfers with assist (PT) Description: LTG: Patient will perform car transfers with assistance (PT). Outcome: Completed/Met Flowsheets (Taken 07/17/2023 2252) LTG: Pt will perform car transfers with assist:: Moderate Assistance - Patient 50 - 74% Note: Using slide board.    Problem: RH Ambulation Goal: LTG Patient will ambulate in controlled environment (PT) Description: LTG: Patient will ambulate in a controlled environment, # of feet with assistance (PT). Outcome: Completed/Met Flowsheets (Taken 07/17/2023 2244) LTG: Pt will ambulate in controlled environ  assist needed:: Maximal Assistance - Patient 25 - 49% LTG: Ambulation distance in controlled environment: 80 ft using LRAD   Problem: RH Wheelchair Mobility Goal: LTG Patient will propel w/c in controlled environment (PT) Description: LTG: Patient will propel wheelchair in controlled environment, # of feet with assist (PT) Outcome: Completed/Met Flowsheets (Taken 06/28/2023 1725) LTG: Pt will propel w/c in controlled environ  assist needed:: Supervision/Verbal cueing LTG: Propel w/c distance in controlled environment: >150 ft Goal: LTG Patient will propel w/c in home environment (PT) Description: LTG: Patient will propel wheelchair in home environment, # of feet with assistance (PT). Outcome: Completed/Met Flowsheets (Taken 06/28/2023 1725) LTG: Pt will propel w/c in home environ  assist needed:: Supervision/Verbal cueing Distance: wheelchair distance in controlled environment: 150 LTG: Propel w/c distance in home environment:  up to 50 ft

## 2023-07-25 NOTE — Progress Notes (Signed)
Physical Therapy Discharge Summary  Patient Details  Name: Zachary Winters MRN: [id number] Date of Birth: 1981-11-21  Date of Discharge from PT service:July 24, 2023  Today's Date: 07/25/2023 PT Individual Time: 0903-1001 PT Individual Time Calculation (min): 58 min    Patient has met 6 of 9 long term goals due to improved activity tolerance, improved balance, improved postural control, increased strength, ability to compensate for deficits, improved attention, improved awareness, and improved coordination.  Patient to discharge at a wheelchair level Full Supervision.   Patient's care partner is independent to provide the necessary physical and cognitive assistance at discharge.  Reasons goals not met: 2 LTGS (dynamic sitting and bed mobility deemed Adequate for Discharge d/t family's ability to provide LOA to complete. Dynamic Standing Balance goal not met d/t pt's continued lack of awareness of L hemibody's ability and disappointment to lack of function in R hemibody.   Recommendation:  Patient will benefit from ongoing skilled PT services in home health setting to continue to advance safe functional mobility, address ongoing impairments in strength, coordination, balance, activity tolerance, cognition, safety awareness, and to minimize fall risk. He will require continued focus to improving understanding of ability to utilize L hemibody, continued focus to initiating R hemibody use with cognitive followed by physical practice, continued focus to gait training with HW in order to continue high intensity gait training for progressing active R motor control.  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 Rarely or not at all Pain Interference with Therapy Activities: 1. Rarely or not at all Pain Interference with Day-to-Day Activities: 1. Rarely or not at all Vision/Perception  Vision - History Ability to See in Adequate Light: 1 Impaired Vision - Assessment Eye Alignment: Within Functional Limits Ocular Range of Motion: Within Functional Limits Alignment/Gaze Preference: Within Defined Limits Tracking/Visual Pursuits: Able to track stimulus in all quads without difficulty Perception Perception: Impaired Inattention/Neglect: Does not attend to right visual field Praxis Praxis: Impaired Praxis Impairment Details: Motor planning  Cognition Overall Cognitive Status: Difficult to assess Arousal/Alertness: Awake/alert Orientation Level: Oriented to person;Oriented to place;Oriented to situation Attention: Focused Focused Attention: Impaired Awareness: Impaired Problem Solving: Impaired Executive Function: Self Monitoring Self Monitoring: Impaired Safety/Judgment: Impaired Sensation Sensation Light Touch: Impaired by gross assessment Additional Comments: assessment limited d/t aphasia Coordination Gross Motor Movements are Fluid and Coordinated: No Fine Motor Movements are Fluid and Coordinated: No Coordination and Movement Description: L hemibody appears WFL for ROM, but with decreased coordination, motor planning. R hemibody non functional due to hemiplegia and moderate hypertone in UE>LE Heel Shin Test: unable with RLE, performs with LLE but requires visual demonstration Motor  Motor Motor: Hemiplegia;Abnormal tone;Abnormal postural alignment and control Motor - Discharge Observations: continued flexor tone in R hemibody (UE>LE), no volitional movement in R hemibody  Mobility Bed Mobility Bed  Mobility: Supine to Sit;Sit to Supine Supine to Sit: Minimal Assistance - Patient > 75% Sit to Supine: Maximal Assistance - Patient 25-49%;Moderate Assistance - Patient 50-74% Transfers Transfers: Sit to Stand;Stand to Sit;Squat Pivot Transfers;Lateral/Scoot Transfers Sit to Stand: Minimal Assistance - Patient > 75% (requires steady handhold) Stand to Sit: Minimal Assistance -  Patient > 75% (at bed rail) Squat Pivot Transfers: Minimal Assistance - Patient > 75% Lateral/Scoot Transfers: Minimal Assistance - Patient > 75%;Moderate Assistance - Patient 50-74% Transfer (Assistive device): Other (Comment) (Lateral scoot over slide board) Locomotion  Gait Ambulation: Yes Gait Assistance: Maximal Assistance - Patient 25-49% Gait Distance (Feet): 75 Feet Assistive device: Other (Comment);Hemi-walker (L hallway HR) Gait Assistance Details: Tactile cues for sequencing;Tactile cues for weight shifting;Tactile cues for weight beaing;Verbal cues for technique;Verbal cues for gait pattern;Manual facilitation for weight shifting;Manual facilitation for placement;Verbal cues for safe use of DME/AE;Verbal cues for sequencing Gait Assistance Details: Requires MaxA to maintain upright posturing d/t continued heavy R lean, forward facilitation of R hip into extension from stance into swing phase, RLE advancement and block of knee into extension with stance phase. Gait Gait: Yes Gait Pattern: Impaired Gait Pattern:  (LLE with increased flexor tone and positioned in ER in stance, R knee block to prevent buckling) Gait velocity: decreased Stairs / Additional Locomotion Stairs: No Wheelchair Mobility Wheelchair Mobility: Yes Wheelchair Assistance: Supervision/Verbal cueing (requires cueing for R visual field and technique) Wheelchair Propulsion: Left upper extremity;Left lower extremity Wheelchair Parts Management: Supervision/cueing;Needs assistance Distance: up to 150 ft with several rest breaks   Trunk/Postural Assessment  Cervical Assessment Cervical Assessment: Exceptions to [facility] (prefers slight L sidebend but can hold in midline; slight forward head) Thoracic Assessment Thoracic Assessment: Exceptions to [facility] (rounded shoulders) Lumbar Assessment Lumbar Assessment: Exceptions to [facility] (post pelvic tilt in seated position) Postural Control Postural Control: Deficits on evaluation Trunk Control: significant lean to R Righting Reactions: significant improvement from eval with return to midline in seated position unless full LOB, difficulty attaining/ returning to midline in stance Protective Responses: decreased  Balance Balance Balance Assessed: Yes Standardized Balance Assessment Standardized Balance Assessment: PASS Postural Assessment Scale for Stroke Patients=PASS 1. Sitting Without Support: Can sit for 5 minutes without support 2. Standing With Support: Can stand with moderate support of 1 person 3. Standing Without Support: Cannot stand without support 4.Standing on Nonparetic Leg: Can stand on nonparetic leg for more than 10 seconds 5.Standing on Paretic Leg: Cannot stand on paretic leg MAINTAINING POSTURE SUBTOTAL: 8 6. Supine to Paretic Side Lateral: Can perform with much help 7. Supine to Nonparetic Side Lateral: Can perform with much help 8. Supine to Sitting Up on the Edge of the Mat: Can perform with little help 9. Sitting on the Edge of the Mat to Supine: Can perform with much help 10. Sitting to Standing Up: Can perform with little help 11. Standing Up to Sitting Down: Can perform with little help 12. Standing,Picking Up a Pencil from the Floor: Can perform with much help CHANGING POSTURE SUBTOTAL: 10 PASS TOTAL SCORE: 18 Static Sitting Balance Static Sitting - Balance Support: Left upper extremity supported Static Sitting - Level of Assistance: 5: Stand by assistance Dynamic Sitting Balance Dynamic Sitting - Balance Support: Left upper extremity  supported Dynamic Sitting - Level of Assistance: 4: Min assist Dynamic Sitting Balance - Compensations: Requires pull with LUE to correct LOB to R side Static Standing Balance Static Standing - Balance Support: Left upper extremity supported Static Standing - Level of Assistance: 4: Min assist (MinA with steady UE support; otherwise MaxA.) Dynamic Standing Balance Dynamic Standing - Balance Support: Left upper extremity supported Dynamic Standing - Level of Assistance: 3: Mod assist;2: Max assist Extremity Assessment      RLE Assessment RLE Assessment: Exceptions to [facility] General Strength Comments: grossly 0/5 with MMT, during ambulation/ stance  noted functional glute and quad activation at 1/ 5 LLE Assessment LLE Assessment: Within Functional Limits  Skilled intervention: Patient seated upright in w/c on entrance to room. Patient alert and agreeable to PT session. Donned new size 13 shoes with GRAFO in R shoe. Improved fit noted from pt.   Patient with no pain complaint at start of session.  Therapeutic Activity: Bed Mobility: Pt performed supine <> sit with MinA and heavy cues.  Transfers: Pt performed sit<>stand transfers with steady hand hold and MinA. Continues to require MaxA for maintaining upright balance d/t reduced motor control in R hemibody. Provided vc for increased L hemibody use.  Gait Training/ NMR:  Pt ambulated 40' x2 using hallway HR in order to attempt to improve proprioception and reduce assist provided to maintain stance over LLE. Continues to require MaxA for balance and RLE advancement. Education provided re: use of LLE. He is able to figure LLE single leg stance with hand hold to L hallway HR. But when attempting to stand over BLE, RLE buckles and requires MaxA to maintain upright stance. Provided vc/ tc for sequencing, motor control, technique. See above for assistance provided.   Wheelchair Mobility:  Pt propelled wheelchair at least 150 feet with supervision  and several rest breaks for effort and frustration. Provided vc/ tc for maneuvering obstacles in L field of vision, cornering.  Patient seated upright in w/c at end of session with brakes locked, belt alarm set, and all needs within reach. Relates appreciation for assistance during rehab stay.    Loel Dubonnet PT, DPT, CSRS 07/24/2023, 12:41 PM

## 2023-07-26 ENCOUNTER — Telehealth: Payer: Self-pay | Admitting: *Deleted

## 2023-07-26 NOTE — Telephone Encounter (Signed)
Transitional Care call--who you talked with    Are you/is patient experiencing any problems since coming home? No Are there any questions regarding any aspect of care? No Are there any questions regarding medications administration/dosing? Pt sister will call back to confirm. Are meds being taken as prescribed? Patient should review meds with caller to confirm Have there been any falls? No Has Home Health been to the house and/or have they contacted you?Yes but was told they would call back with appt.  If not, have you tried to contact them? N/a  Can we help you contact them? N/a Are bowels and bladder emptying properly? Yes Are there any unexpected incontinence issues?No  If applicable, is patient following bowel/bladder programs?n/a Any fevers, problems with breathing, unexpected pain? No Are there any skin problems or new areas of breakdown? No Has the patient/family member arranged specialty MD follow up (ie cardiology/neurology/renal/surgical/etc)? Not yet made her aware these #s are on the last page of discharge. Can we help arrange? No Does the patient need any other services or support that we can help arrange? Not at this time Are caregivers following through as expected in assisting the patient? Yes, patient living with his sister. Has the patient quit smoking, drinking alcohol, or using drugs as recommended? Yes  Appointment time, arrive time and who it is with here [address]    TC appointment scheduled 8/14 to arrive at 1pm for 1:20 appointment with Riley Lam 1st and then w/ Dr Wynn Banker , LVM with appt details - mailed paperwork

## 2023-07-31 ENCOUNTER — Telehealth: Payer: Self-pay

## 2023-07-31 NOTE — Telephone Encounter (Signed)
Myrtie Neither, SLP from [facility] called for a verbal order for 1wk1, 2wk4, 1wk4 for ST. Orders approved and given.

## 2023-08-02 ENCOUNTER — Telehealth: Payer: Self-pay | Admitting: *Deleted

## 2023-08-02 ENCOUNTER — Other Ambulatory Visit: Payer: Self-pay

## 2023-08-02 ENCOUNTER — Telehealth: Payer: Self-pay

## 2023-08-02 NOTE — Telephone Encounter (Signed)
[facility] called for [facility] POC 2wk1 and 1 wk5.  Approval given.

## 2023-08-02 NOTE — Telephone Encounter (Signed)
Verbal okay given for Physical Therapy once a week for 4 weeks. Order given  to [facility] phone [phone].

## 2023-08-08 ENCOUNTER — Encounter: Payer: Self-pay | Admitting: General Practice

## 2023-08-08 NOTE — Progress Notes (Unsigned)
Subjective:    Patient ID: Zachary Winters, male    DOB: [birth year], 42 y.o.   MRN: [id number]  HPI: Zachary Winters is a 42 y.o. 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  He was brought to [facility] ED on 06/14/2023 via EMS with right sided weakness, facial droop and aphasia. H&P: Dr Laural Benes: 06/14/2023  Chief Complaint: aphasia   HPI: Zachary Winters is a 42 year old gentleman with poorly controlled diabetes mellitus on insulin, chronic daily smoker, hypertension, history of marijuana use, diabetic peripheral neuropathy, hyperlipidemia was sent to the emergency department by [facility] with right-sided weakness right-sided facial droop and difficulty speaking.  He was last known to be well 2 days ago at 6 AM when his sister noticed he went out to smoke on ring camera.  He apparently lives with his sister.  Sister noticed the patient lying on the floor having a difficult time communicating only able to nod yes and no.  She noticed that for the last 2 days he has been lying around and sleeping.   He was seen in the ED and noted to have a significant aphasia and right-sided hemiparesis.  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  His blood glucose was 109.  Code Stroke was not activated due to being 40 hours since last known well time.  Pt is being admitted into the hospital for further work up and treatment of acute CVA.     CT Head WO Contrast:  IMPRESSION: Moderate-sized subacute infarct in the left basal ganglia/corona radiata with probable petechial hemorrhage. Mild regional mass effect but no midline shift.  Bilateral Carotid  Ultrasound: IMPRESSION: 1. Right carotid artery system: Patent without significant atherosclerotic plaque formation.   2. Left carotid artery system: Patent without significant atherosclerotic plaque formation.   3.  Vertebral artery system: Patent with antegrade flow bilaterally.   4. 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 Correlate with recently obtained echocardiogram.   MR Brain WO Contrast:  IMPRESSION: 1. Acute to subacute perforator infarct at the left basal ganglia and adjacent white matter with petechial hemorrhage. 2. Underline severe stenosis versus segmental occlusion at the left M1 segment. 3. The left ICA is smaller than the right and neck arterial imaging is recommended to exclude proximal stenosis with underfilling.  MRA:  IMPRESSION: 1. Acute to subacute perforator infarct at the left basal ganglia and adjacent white matter with petechial hemorrhage. 2. Underline severe stenosis versus segmental occlusion at the left M1 segment. 3. The left ICA is smaller than the right and neck arterial imaging is recommended to exclude proximal stenosis with underfilling.  Neurology Consulted: Recommended: DAPT x 3 months  Zachary Winters was admitted to inpatient rehabilitation on 06/27/2023 and discharged home on 07/25/2023. He is receiving Home Health Therapy  with [facility]. He denies any pain . He rates his pain 0. Also reports he and his sister that he has a good appetite.    Sister in room, all questions answered.   Pain Inventory Average Pain 0 Pain Right Now 0 My pain is  denies pain  LOCATION OF PAIN  No pain  BOWEL Number  of stools per week: 1-2 Oral laxative use Yes  Type of laxative Senokot  Enema or suppository use Yes    BLADDER Normal    Mobility ability to climb steps?  no do you drive?  no use a wheelchair needs help with transfers Do you have any goals in  this area?  yes  Function disabled: date disabled Applied I need assistance with the following:  dressing, bathing, toileting, meal prep, household duties, and shopping Do you have any goals in this area?  yes  Neuro/Psych weakness trouble walking  Prior Studies Any changes since last visit?  no  Physicians involved in your care Any changes since last visit?  no   No family history on file. Social History   Socioeconomic History   Marital status: Single    Spouse name: Not on file   Number of children: 1   Years of education: Not on file   Highest education level: Some college, no degree  Occupational History   Not on file  Tobacco Use   Smoking status: Every Day    Current packs/day: 0.25    Average packs/day: 0.3 packs/day for 20.0 years (5.0 ttl pk-yrs)    Types: Cigarettes   Smokeless tobacco: Never  Vaping Use   Vaping status: Never Used  Substance and Sexual Activity   Alcohol use: Yes   Drug use: Yes    Frequency: 2.0 times per week    Types: Marijuana   Sexual activity: Yes  Other Topics Concern   Not on file  Social History Narrative   Lives with parents   Has son 6 years-Zachary Winters      Enjoy: BB and son       Diet: everything    Caffeine: tea daily    Water: 3-4 bottles of 16 oz      Wear seat belt   Wear sun protection   Smoke detectors    Does not use phone while driving       Social Determinants of Health   Financial Resource Strain: Low Risk  (10/31/2019)   Overall Financial Resource Strain (CARDIA)    Difficulty of Paying Living Expenses: Not hard at all  Food Insecurity: No Food Insecurity (10/31/2019)   Hunger Vital Sign    Worried About Running Out of Food in the Last Year: Never true    Ran Out of Food in the Last Year: Never true  Transportation Needs: No Transportation Needs (10/31/2019)   PRAPARE - Administrator, Civil Service (Medical): No    Lack of Transportation (Non-Medical): No  Physical Activity: Sufficiently  Active (10/31/2019)   Exercise Vital Sign    Days of Exercise per Week: 3 days    Minutes of Exercise per Session: 60 min  Stress: No Stress Concern Present (10/31/2019)   [employer] of Occupational Health - Occupational Stress Questionnaire    Feeling of Stress : Not at all  Social Connections: Socially Isolated (10/31/2019)   Social Connection and Isolation Panel [NHANES]    Frequency of Communication with Friends and Family: More than three times a week    Frequency of Social Gatherings with Friends and Family: More than three times a week    Attends Religious Services: Never    Database administrator or Organizations: No    Attends Banker Meetings: Never    Marital Status: Divorced   Past Surgical History:  Procedure Laterality Date   TEE WITHOUT CARDIOVERSION N/A 06/16/2023  Procedure: TRANSESOPHAGEAL ECHOCARDIOGRAM (TEE);  Surgeon: Jonelle Sidle, MD;  Location: [facility];  Service: Cardiovascular;  Laterality: N/A;   Past Medical History:  Diagnosis Date   Nausea and vomiting    chronic for years.   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  Opioid Risk Score:   Fall Risk Score:  `1  Depression screen [facility] 2/9     01/21/2021   11:15 AM 01/01/2020    3:06 PM 10/31/2019    8:29 AM  Depression screen PHQ 2/9  Decreased Interest 1 0 0  Down, Depressed, Hopeless 2 0 2  PHQ - 2 Score 3 0 2  Altered sleeping 2  2  Tired, decreased energy 1  2  Change in appetite 2  0  Feeling bad or failure about yourself  0  0  Trouble concentrating 1  0  Moving slowly or fidgety/restless 0  0  Suicidal thoughts 0  0  PHQ-9 Score 9  6    Review of Systems  Eyes:  Positive for visual disturbance.       Right eye  problem before stroke   Musculoskeletal:  Positive for gait problem.  Neurological:  Positive for speech difficulty and weakness.  All other systems reviewed and are negative.     Objective:   Physical Exam Vitals and nursing note reviewed.  Constitutional:      Appearance: Normal appearance.  Cardiovascular:     Rate and Rhythm: Normal rate and regular rhythm.     Pulses: Normal pulses.     Heart sounds: Normal heart sounds.  Pulmonary:     Effort: Pulmonary effort is normal.     Breath sounds: Normal breath sounds.  Musculoskeletal:     Cervical back: Normal range of motion and neck supple.     Comments: Normal Muscle Bulk and Muscle Testing Reveals:  Upper Extremities: Right: Upper Extremity: Paralysis: Muscle Strength 0/5 Left Upper Extremity: Full ROM and Muscle Strength 5/5 Lower Extremities: Right: Paralysis: Wearing AFO Brace.  Left Lower Extremity: Full ROM and Muscle Strength  5/5 Arrived in wheelchair      Skin:    General: Skin is warm and dry.  Neurological:     Mental Status: He is alert and oriented to person, place, and time.  Psychiatric:        Mood and Affect: Mood normal.        Behavior: Behavior normal.         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 Left message to return the call. Sister was instructed to call them if she doesn't receive a return call, she verbalizes understanding. Continue current medication regimen.  2.  Hypertension associated with diabetes: Continue current medication regimen. He has a scheduled appointment with PCP. Continue to monitor.  3.  Hyperlipidemia associated with type 2 diabetes mellitus: Continue current medication regimen. He has a scheduled appointment with PCP. Continue to Monitor.  4.Type 2 Diabetes Mellitus with diabetic neuropathy: Continue current medication regimen. He has a scheduled appointment with PCP.  Continue to Monitor.  5. Muscle Spasticity: Continue current medication with  Baclofen. Continue to Monitor.   F/U with Dr Wynn Banker in 4- 6 weeks

## 2023-08-09 ENCOUNTER — Encounter: Payer: Medicaid Other | Attending: Registered Nurse | Admitting: Registered Nurse

## 2023-08-09 ENCOUNTER — Encounter: Payer: Self-pay | Admitting: Registered Nurse

## 2023-08-09 VITALS — BP 124/83 | HR 76 | Ht 73.0 in

## 2023-08-09 DIAGNOSIS — E1159 Type 2 diabetes mellitus with other circulatory complications: Secondary | ICD-10-CM | POA: Insufficient documentation

## 2023-08-09 DIAGNOSIS — M62838 Other muscle spasm: Secondary | ICD-10-CM

## 2023-08-09 DIAGNOSIS — E1140 Type 2 diabetes mellitus with diabetic neuropathy, unspecified: Secondary | ICD-10-CM | POA: Diagnosis present

## 2023-08-09 DIAGNOSIS — Z794 Long term (current) use of insulin: Secondary | ICD-10-CM | POA: Diagnosis present

## 2023-08-09 DIAGNOSIS — E785 Hyperlipidemia, unspecified: Secondary | ICD-10-CM | POA: Diagnosis present

## 2023-08-09 DIAGNOSIS — I63512 Cerebral infarction due to unspecified occlusion or stenosis of left middle cerebral artery: Secondary | ICD-10-CM

## 2023-08-09 DIAGNOSIS — I152 Hypertension secondary to endocrine disorders: Secondary | ICD-10-CM | POA: Diagnosis present

## 2023-08-09 DIAGNOSIS — E1169 Type 2 diabetes mellitus with other specified complication: Secondary | ICD-10-CM | POA: Diagnosis present

## 2023-08-09 NOTE — Patient Instructions (Signed)
Called [facility] today, left message to schedule Hospital Follow Up appointment.  [phone] If no one calls you by Monday please call them to schedule an appointment

## 2023-08-10 ENCOUNTER — Encounter: Payer: Self-pay | Admitting: Registered Nurse

## 2023-08-16 ENCOUNTER — Telehealth: Payer: Self-pay | Admitting: *Deleted

## 2023-08-16 NOTE — Telephone Encounter (Signed)
Staci lvm requesting PT 1 time per week for 5 weeks.  Returned call to [facility] and left verbal orders on secure voicemail.

## 2023-08-19 ENCOUNTER — Other Ambulatory Visit: Payer: Self-pay | Admitting: Physical Medicine and Rehabilitation

## 2023-08-22 ENCOUNTER — Ambulatory Visit: Payer: Medicaid Other | Admitting: Family Medicine

## 2023-08-22 ENCOUNTER — Encounter: Payer: Self-pay | Admitting: Family Medicine

## 2023-08-22 VITALS — BP 112/70 | HR 87 | Ht 73.0 in | Wt 176.1 lb

## 2023-08-22 DIAGNOSIS — E7849 Other hyperlipidemia: Secondary | ICD-10-CM

## 2023-08-22 DIAGNOSIS — E1169 Type 2 diabetes mellitus with other specified complication: Secondary | ICD-10-CM

## 2023-08-22 DIAGNOSIS — E559 Vitamin D deficiency, unspecified: Secondary | ICD-10-CM

## 2023-08-22 DIAGNOSIS — I63512 Cerebral infarction due to unspecified occlusion or stenosis of left middle cerebral artery: Secondary | ICD-10-CM | POA: Diagnosis not present

## 2023-08-22 DIAGNOSIS — E1140 Type 2 diabetes mellitus with diabetic neuropathy, unspecified: Secondary | ICD-10-CM | POA: Diagnosis not present

## 2023-08-22 DIAGNOSIS — K5909 Other constipation: Secondary | ICD-10-CM

## 2023-08-22 DIAGNOSIS — E038 Other specified hypothyroidism: Secondary | ICD-10-CM

## 2023-08-22 DIAGNOSIS — I10 Essential (primary) hypertension: Secondary | ICD-10-CM

## 2023-08-22 DIAGNOSIS — Z23 Encounter for immunization: Secondary | ICD-10-CM | POA: Diagnosis not present

## 2023-08-22 DIAGNOSIS — Z114 Encounter for screening for human immunodeficiency virus [HIV]: Secondary | ICD-10-CM

## 2023-08-22 DIAGNOSIS — Z1159 Encounter for screening for other viral diseases: Secondary | ICD-10-CM

## 2023-08-22 DIAGNOSIS — Z794 Long term (current) use of insulin: Secondary | ICD-10-CM

## 2023-08-22 DIAGNOSIS — E785 Hyperlipidemia, unspecified: Secondary | ICD-10-CM

## 2023-08-22 DIAGNOSIS — R7301 Impaired fasting glucose: Secondary | ICD-10-CM

## 2023-08-22 MED ORDER — CLOPIDOGREL BISULFATE 75 MG PO TABS
75.0000 mg | ORAL_TABLET | Freq: Every day | ORAL | 2 refills | Status: DC
Start: 2023-08-22 — End: 2024-02-22

## 2023-08-22 MED ORDER — ATORVASTATIN CALCIUM 80 MG PO TABS
80.0000 mg | ORAL_TABLET | Freq: Every day | ORAL | 1 refills | Status: DC
Start: 2023-08-22 — End: 2024-02-22

## 2023-08-22 MED ORDER — CARVEDILOL 6.25 MG PO TABS
6.2500 mg | ORAL_TABLET | Freq: Two times a day (BID) | ORAL | 1 refills | Status: DC
Start: 2023-08-22 — End: 2023-11-20

## 2023-08-22 MED ORDER — INSULIN GLARGINE 100 UNIT/ML SOLOSTAR PEN
18.0000 [IU] | PEN_INJECTOR | Freq: Every day | SUBCUTANEOUS | 11 refills | Status: DC
Start: 2023-08-22 — End: 2023-10-27

## 2023-08-22 MED ORDER — SENNOSIDES-DOCUSATE SODIUM 8.6-50 MG PO TABS
1.0000 | ORAL_TABLET | Freq: Every day | ORAL | 1 refills | Status: AC
Start: 2023-08-22 — End: ?

## 2023-08-22 MED ORDER — AMLODIPINE BESYLATE 10 MG PO TABS
10.0000 mg | ORAL_TABLET | Freq: Every day | ORAL | 1 refills | Status: DC
Start: 2023-08-22 — End: 2024-02-22

## 2023-08-22 MED ORDER — GLIMEPIRIDE 2 MG PO TABS
2.0000 mg | ORAL_TABLET | Freq: Every day | ORAL | 1 refills | Status: DC
Start: 2023-08-22 — End: 2024-02-22

## 2023-08-22 NOTE — Assessment & Plan Note (Signed)
The patient was admitted to [facility] on June 14, 2023, and diagnosed with an ischemic stroke of the left middle cerebral artery. He was subsequently transferred to [facility] and discharged from the rehabilitation unit. Since discharge, the patient has been receiving speech therapy twice weekly, occupational therapy twice weekly, and physical therapy once weekly.  The patient's sister reports that he is currently unable to bathe, dress, or use the bathroom independently due to right-sided weakness resulting from the stroke. He is scheduled to follow up with neurology on September 15, 2023. Medication refills have been requested today, and PCS forms have been completed.

## 2023-08-22 NOTE — Progress Notes (Signed)
New Patient Office Visit  Subjective:  Patient ID: Zachary Winters, male    DOB: April 12, 1981  Age: 42 y.o. MRN: [id number]  CC:  Chief Complaint  Patient presents with   New Patient (Initial Visit)    Establishing care, needs refills on all his meds. Pt needs PCS forms completed has with him, pt sister is with patient, states FMLA forms will be needed for her also they will be faxed in, since she carries him to and from appointments.     HPI Zachary Winters is a 42 y.o. male with past medical history of poorly controlled diabetes, peripheral neuropathy, tobacco use, hypertension presents for establishing care. For the details of today's visit, please refer to the assessment and plan.     Past Medical History:  Diagnosis Date   Nausea and vomiting    chronic for years.   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 No pertinent family history.  Social History   Socioeconomic History   Marital status: Single    Spouse name: Not on file   Number of children: 1   Years of education: Not on file   Highest education level: Some college, no degree  Occupational History   Not on file  Tobacco Use   Smoking status: Former    Current packs/day: 0.25    Average packs/day: 0.3 packs/day for 20.0 years (5.0 ttl pk-yrs)    Types: Cigarettes   Smokeless tobacco: Never  Vaping Use   Vaping status: Never Used  Substance and Sexual Activity   Alcohol use: Not Currently   Drug use: Not Currently    Frequency: 2.0 times  per week    Types: Marijuana   Sexual activity: Yes  Other Topics Concern   Not on file  Social History Narrative   Lives with parents   Has son 6 years-[name]      Enjoy: BB and son       Diet: everything    Caffeine: tea daily    Water: 3-4 bottles of 16 oz      Wear seat belt   Wear sun protection   Smoke detectors    Does not use phone while driving       Social Determinants of Health   Financial Resource Strain: Low Risk  (10/31/2019)   Overall Financial Resource Strain (CARDIA)    Difficulty of Paying Living Expenses: Not hard at all  Food Insecurity: No Food Insecurity (10/31/2019)   Hunger Vital Sign    Worried About Running Out of Food in the Last Year: Never true    Ran Out of Food in the Last Year: Never true  Transportation Needs: No Transportation Needs (10/31/2019)   PRAPARE - Administrator, Civil Service (Medical): No    Lack of Transportation (Non-Medical): No  Physical Activity: Sufficiently Active (10/31/2019)   Exercise Vital Sign    Days of Exercise per Week: 3 days    Minutes of Exercise per Session: 60  min  Stress: No Stress Concern Present (10/31/2019)   [employer] of Occupational Health - Occupational Stress Questionnaire    Feeling of Stress : Not at all  Social Connections: Socially Isolated (10/31/2019)   Social Connection and Isolation Panel [NHANES]    Frequency of Communication with Friends and Family: More than three times a week    Frequency of Social Gatherings with Friends and Family: More than three times a week    Attends Religious Services: Never    Database administrator or Organizations: No    Attends Banker Meetings: Never    Marital Status: Divorced  Catering manager Violence: Not At Risk (10/31/2019)   Humiliation, Afraid, Rape, and Kick questionnaire    Fear of Current or Ex-Partner: No    Emotionally Abused: No    Physically Abused: No    Sexually Abused: No    ROS Review of Systems   Constitutional:  Negative for chills, fatigue and fever.  Respiratory:  Negative for chest tightness and shortness of breath.   Gastrointestinal:  Negative for diarrhea, nausea and vomiting.  Genitourinary:  Negative for decreased urine volume, penile pain, penile swelling and urgency.  Musculoskeletal:  Positive for gait problem. Negative for arthralgias, back pain, neck pain and neck stiffness.       Wheelchair bound  Neurological:  Negative for dizziness and headaches.    Objective:   Today's Vitals: BP 112/70   Pulse 87   Ht 6\' 1"  (1.854 m)   Wt 176 lb 2.4 oz (79.9 kg)   SpO2 98%   BMI 23.24 kg/m   Physical Exam HENT:     Head: Normocephalic.     Right Ear: External ear normal.     Left Ear: External ear normal.     Nose: No congestion or rhinorrhea.     Mouth/Throat:     Mouth: Mucous membranes are moist.  Cardiovascular:     Rate and Rhythm: Regular rhythm.     Heart sounds: No murmur heard. Pulmonary:     Effort: No respiratory distress.     Breath sounds: Normal breath sounds.  Neurological:     Mental Status: He is alert.     Motor: Weakness (right sided) present.     Coordination: Finger-Nose-Finger Test abnormal (right sided paralysis from stroke).     Gait: Gait abnormal (wheelchair bound).      Assessment & Plan:   Acute ischemic left middle cerebral artery (MCA) stroke [facility]) Assessment & Plan: The patient was admitted to [facility] on June 14, 2023, and diagnosed with an ischemic stroke of the left middle cerebral artery. He was subsequently transferred to [facility] and discharged from the rehabilitation unit. Since discharge, the patient has been receiving speech therapy twice weekly, occupational therapy twice weekly, and physical therapy once weekly.  The patient's sister reports that he is currently unable to bathe, dress, or use the bathroom independently due to right-sided weakness resulting from the stroke. He is scheduled to follow up with  neurology on September 15, 2023. Medication refills have been requested today, and PCS forms have been completed.  Orders: -     Clopidogrel Bisulfate; Take 1 tablet (75 mg total) by mouth daily.  Dispense: 60 tablet; Refill: 2  Immunization due  Type 2 diabetes mellitus with diabetic neuropathy, with long-term current use of insulin (HCC) -     Microalbumin / creatinine urine ratio -     Glimepiride; Take 1 tablet (2 mg total)  by mouth daily with breakfast.  Dispense: 90 tablet; Refill: 1 -     Insulin Glargine; Inject 18 Units into the skin at bedtime.  Dispense: 15 mL; Refill: 11  IFG (impaired fasting glucose) -     Hemoglobin A1c  Vitamin D deficiency -     VITAMIN D 25 Hydroxy (Vit-D Deficiency, Fractures)  Need for hepatitis C screening test -     Hepatitis C antibody  Encounter for screening for HIV -     HIV Antibody (routine testing w rflx)  Other specified hypothyroidism -     TSH + free T4  Other hyperlipidemia -     Lipid panel -     CMP14+EGFR -     CBC with Differential/Platelet  Essential hypertension -     amLODIPine Besylate; Take 1 tablet (10 mg total) by mouth daily.  Dispense: 90 tablet; Refill: 1 -     Carvedilol; Take 1 tablet (6.25 mg total) by mouth 2 (two) times daily with a meal.  Dispense: 90 tablet; Refill: 1  Hyperlipidemia associated with type 2 diabetes mellitus (HCC) -     Atorvastatin Calcium; Take 1 tablet (80 mg total) by mouth daily after supper.  Dispense: 90 tablet; Refill: 1  Other constipation -     Sennosides-Docusate Sodium; Take 1 tablet by mouth at bedtime.  Dispense: 90 tablet; Refill: 1   Note: This chart has been completed using Engineer, civil (consulting) software, and while attempts have been made to ensure accuracy, certain words and phrases may not be transcribed as intended.    Follow-up: Return in about 3 months (around 11/22/2023).   Gilmore Laroche, FNP

## 2023-08-22 NOTE — Patient Instructions (Signed)
I appreciate the opportunity to provide care to you today!    Follow up:  3 months  Labs: please stop by the lab today/during the week to get your blood drawn (CBC, CMP, TSH, Lipid profile, HgA1c, Vit D)  Screening: HIV and Hep C    Please continue to a heart-healthy diet and increase your physical activities. Try to exercise for at least five days a week.    It was a pleasure to see you and I look forward to continuing to work together on your health and well-being. Please do not hesitate to call the office if you need care or have questions about your care.  In case of emergency, please visit the Emergency Department for urgent care, or contact our clinic at [phone] to schedule an appointment. We're here to help you!   Have a wonderful day and week. With Gratitude, Gilmore Laroche MSN, FNP-BC

## 2023-08-24 ENCOUNTER — Telehealth: Payer: Self-pay | Admitting: Registered Nurse

## 2023-08-24 MED ORDER — BACLOFEN 10 MG PO TABS: 15.0000 mg | ORAL_TABLET | Freq: Three times a day (TID) | ORAL | 1 refills | Status: DC

## 2023-08-24 MED ORDER — TIZANIDINE HCL 4 MG PO TABS: 4.0000 mg | ORAL_TABLET | Freq: Three times a day (TID) | ORAL | 1 refills | Status: DC

## 2023-08-24 NOTE — Telephone Encounter (Signed)
Call placed to Ms. [name] Mr. Cink sister, she reports Mr. Zwack not having any bladder issues, he is voiding without any problems. Urecholine, Flomax and Flonase discontinued per Mr. Leyden sister.This was discussed with Dr Wynn Banker.

## 2023-08-25 ENCOUNTER — Telehealth: Payer: Self-pay | Admitting: *Deleted

## 2023-08-25 NOTE — Telephone Encounter (Signed)
Pharmacy wants to know if you are switching baclofen to tizanadine. Patient just picked up tizanadine.

## 2023-08-29 NOTE — Telephone Encounter (Signed)
Notified pharmacy no changes in patient medication regimen.

## 2023-09-15 ENCOUNTER — Encounter: Payer: Self-pay | Admitting: Physical Medicine & Rehabilitation

## 2023-09-15 ENCOUNTER — Encounter: Payer: Medicaid Other | Attending: Registered Nurse | Admitting: Physical Medicine & Rehabilitation

## 2023-09-15 VITALS — BP 145/87 | HR 77 | Ht 73.0 in

## 2023-09-15 DIAGNOSIS — I63512 Cerebral infarction due to unspecified occlusion or stenosis of left middle cerebral artery: Secondary | ICD-10-CM

## 2023-09-15 DIAGNOSIS — I69351 Hemiplegia and hemiparesis following cerebral infarction affecting right dominant side: Secondary | ICD-10-CM | POA: Diagnosis present

## 2023-09-15 NOTE — Progress Notes (Signed)
Subjective:    Patient ID: Zachary Winters, male    DOB: 10-22-1981, 42 y.o.   MRN: [id number] Admit date: 06/27/2023 Discharge date: 07/25/2023  42 y.o. male with history of poorly controlled DM with neuropathy, HTN, tobacco use who was admitted to [facility] on 06/14/2023 with decreased responsiveness x 2 days.  He was found by sister with right-sided weakness, facial droop with inability to speak as well as reports of being on floor 2 days prior to admission with elevated blood sugars and then was fatigued and lying around.  UDS positive for THC.  He was out of window for TNK.  MRI/MRI brain done showing acute to subacute perforator infarct left basal ganglia with underlying severe stenosis versus segmental occlusion of left M1 segment, left ICA noted to be smaller than right and neck atrial imaging recommended to rule out proximal stenosis.  Carotid Dopplers done which were negative for significant ICA stenosis.  TEE done showing EF of 55 to 60% with normal LVEF and moderate concentric LVH, tricuspid AV and was negative for thrombus or ideation.  Hospital course was significant for decreased energy levels as well as emesis and poor p.o. intake.     Dr. Melynda Ripple recommended DAPT x 3 months stroke was felt to be likely due to M1 occlusion.  CT chest, abdomen pelvis ordered to rule out occult malignancy and was negative for this but showed advanced 3 vessel coronary artery calcification, atherosclerotic calcification in aorta and iliac arteries as well as concerns of chronic atypical infection.  Hypercoagulopathy panel was negative.  Plans for left MCA stenting however procedure postponed pending reasonable improvement after CIR.  Core track was placed for nutritional support due to issues with coughing and difficulty managing secretion.  MBS was repeated on 06/28 and he was started on dysphagia 1 with honey liquids.  Ergocalciferol added due to vitamin D deficiency.  His mentation was improving with increase ability  to produce sounds, blood sugars were noted to be poorly controlled, he was found to have core track with vomitus in mouth therefore this was DC'd prior to discharge.  Intake was poor as he was refusing pured foods.  Therapy was working with patient was limited by right hemiplegia with left inattention, expressive/receptive aphasia, emerging spasticity and was working on pre-gait activity  He requires min assist for transfers and max assist to ambulate 75 feet with hemiwalker and tactile cues for sequencing, weightbearing and manual facilitation for foot placement.  He is tolerating advancement of diet to D3 thin liquids.  He requires min to mod assist for expression and min assist for comprehension and requires multimodal cues for communication and speech intelligibility  HPI 42 year old male who suffered a large left MCA distribution infarct with severe dysphagia, aphasia, right spastic hemiparesis who completed his inpatient rehab and is now living with family assistance.  He is mainly wheelchair-bound.  He needs help with transfers.  He is unable to ambulate independently or climb steps.  He is having some right upper extremity pain with range of motion.  He does not tolerate his wrist splint very well. PT, OT, SLP via [facility]  No amb with therapy  Needs assist for dressing and bathing, standing at sink with therapy   Pain Inventory Average Pain 6 Pain Right Now 6 My pain is aching  LOCATION OF PAIN  wrist  BOWEL Number of stools per week: 5-7   BLADDER Normal    Mobility ability to climb steps?  no do you drive?  no use a wheelchair needs help with transfers  Function I need assistance with the following:  feeding, dressing, bathing, toileting, meal prep, household duties, and shopping  Neuro/Psych No problems in this area  Prior Studies Any changes since last visit?  no  Physicians involved in your care Any changes since last visit?  no   No family history on file. Social  History   Socioeconomic History   Marital status: Single    Spouse name: Not on file   Number of children: 1   Years of education: Not on file   Highest education level: Some college, no degree  Occupational History   Not on file  Tobacco Use   Smoking status: Former    Current packs/day: 0.25    Average packs/day: 0.3 packs/day for 20.0 years (5.0 ttl pk-yrs)    Types: Cigarettes   Smokeless tobacco: Never  Vaping Use   Vaping status: Never Used  Substance and Sexual Activity   Alcohol use: Not Currently   Drug use: Not Currently    Frequency: 2.0 times per week    Types: Marijuana   Sexual activity: Yes  Other Topics Concern   Not on file  Social History Narrative   Lives with parents   Has son 6 years-Zachary Winters      Enjoy: BB and son       Diet: everything    Caffeine: tea daily    Water: 3-4 bottles of 16 oz      Wear seat belt   Wear sun protection   Smoke detectors    Does not use phone while driving       Social Determinants of Health   Financial Resource Strain: Low Risk  (10/31/2019)   Overall Financial Resource Strain (CARDIA)    Difficulty of Paying Living Expenses: Not hard at all  Food Insecurity: No Food Insecurity (10/31/2019)   Hunger Vital Sign    Worried About Running Out of Food in the Last Year: Never true    Ran Out of Food in the Last Year: Never true  Transportation Needs: No Transportation Needs (10/31/2019)   PRAPARE - Administrator, Civil Service (Medical): No    Lack of Transportation (Non-Medical): No  Physical Activity: Sufficiently Active (10/31/2019)   Exercise Vital Sign    Days of Exercise per Week: 3 days    Minutes of Exercise per Session: 60 min  Stress: No Stress Concern Present (10/31/2019)   [employer] of Occupational Health - Occupational Stress Questionnaire    Feeling of Stress : Not at all  Social Connections: Socially Isolated (10/31/2019)   Social Connection and Isolation Panel [NHANES]    Frequency  of Communication with Friends and Family: More than three times a week    Frequency of Social Gatherings with Friends and Family: More than three times a week    Attends Religious Services: Never    Database administrator or Organizations: No    Attends Banker Meetings: Never    Marital Status: Divorced   Past Surgical History:  Procedure Laterality Date   TEE WITHOUT CARDIOVERSION N/A 06/16/2023   Procedure: TRANSESOPHAGEAL ECHOCARDIOGRAM (TEE);  Surgeon: Jonelle Sidle, MD;  Location: [facility];  Service: Cardiovascular;  Laterality: N/A;   Past Medical History:  Diagnosis Date   Nausea and vomiting    chronic for years.   Neuropathy    BLE --pain at nights   Right eye affected by diabetic retinopathy and macular  edema (HCC)    decreased vision--has been getting injections   Type 1 diabetes mellitus (HCC)    Diagnosed at age 9 (has been on insulin every since dx)   BP (!) 145/87   Pulse 77   Ht 6\' 1"  (1.854 m)   SpO2 98%   BMI 23.24 kg/m   Opioid Risk Score:   Fall Risk Score:  `1  Depression screen [facility] 2/9     09/15/2023   11:14 AM 08/22/2023    9:38 AM 08/09/2023    1:39 PM 01/21/2021   11:15 AM 01/01/2020    3:06 PM 10/31/2019    8:29 AM  Depression screen PHQ 2/9  Decreased Interest 0 0 0 1 0 0  Down, Depressed, Hopeless 0 0 0 2 0 2  PHQ - 2 Score 0 0 0 3 0 2  Altered sleeping  0 0 2  2  Tired, decreased energy  0 0 1  2  Change in appetite  0 0 2  0  Feeling bad or failure about yourself   0 0 0  0  Trouble concentrating  0 0 1  0  Moving slowly or fidgety/restless  0 0 0  0  Suicidal thoughts  0 0 0  0  PHQ-9 Score  0 0 9  6  Difficult doing work/chores  Not difficult at all       `   Review of Systems  All other systems reviewed and are negative.     Objective:   Physical Exam Vitals and nursing note reviewed.  Constitutional:      Appearance: He is normal weight.  HENT:     Head: Normocephalic and atraumatic.  Eyes:      Extraocular Movements: Extraocular movements intact.     Conjunctiva/sclera: Conjunctivae normal.     Pupils: Pupils are equal, round, and reactive to light.  Musculoskeletal:        General: No tenderness or deformity.     Right lower leg: No edema.     Left lower leg: No edema.     Comments: Right upper extremity without joint deformity no erythema no evidence of joint effusion.  Skin:    General: Skin is warm and dry.  Neurological:     Mental Status: He is alert and oriented to person, place, and time.     Comments: Tone MAS 3 at the elbow flexors MAS 3 at the finger flexors MAS 2 at the wrist flexors There is pain with passive extension of the fingers  Motor strength is 0/5 in the right upper extremity.  Tone MAS 2 at the knee flexors No evidence of clonus at the ankle. Motor strength 0/5 in the right hip flexor knee flexor knee extensor ankle dorsiflexor   Psychiatric:        Mood and Affect: Mood normal.        Behavior: Behavior normal.    Speech able to name 2 out of 5 simple objects Patient trying to do short phrase level utterance as well       Assessment & Plan:   1.  History of left MCA distribution infarct with right hemiparesis and aphasia.  No significant improvements in motor strength there are some improvement with his aphasia.  His tone is increasing the right upper extremity despite being on a fairly high dose of baclofen.  Would recommend botulinum toxin injection to the following muscle groups.  Discussed duration of effect with the patient and sister.  They would like to schedule Botox or Xeomin  Right upper extremity dominant side Biceps 50 units Brachialis 50 units Brachial radialis 50 units FCR 50 units FDS 50 units FDP 50 units

## 2023-09-19 ENCOUNTER — Telehealth: Payer: Self-pay | Admitting: *Deleted

## 2023-09-19 NOTE — Telephone Encounter (Signed)
[facility] OT [facility] request OT ext 1 wk 4.  Approval given.

## 2023-09-20 ENCOUNTER — Telehealth: Payer: Self-pay

## 2023-09-20 NOTE — Telephone Encounter (Signed)
Myrtie Neither, ST from [facility] called for verbal order to continue ST 1wk8. Orders approved and given.

## 2023-09-21 ENCOUNTER — Telehealth: Payer: Self-pay | Admitting: Physical Medicine & Rehabilitation

## 2023-09-21 NOTE — Telephone Encounter (Signed)
Stacey from [facility] called requesting to recert PT for patient for 1x week for 4 weeks and also need a nursing order - patient has a new stage 3 wound. Verbal orders accepted and she does not answer confidential VM [phone]

## 2023-09-22 ENCOUNTER — Encounter: Payer: Self-pay | Admitting: *Deleted

## 2023-09-22 NOTE — Telephone Encounter (Signed)
Error

## 2023-09-22 NOTE — Telephone Encounter (Signed)
Zachary Winters called back to verify recert POC and sending a nurse out to eval the wound stg 3 on R ankle lateral malleolus. I told her it was ok to send nurse to eval but want to find out from Dr Wynn Banker about who would manage wound orders.

## 2023-09-25 NOTE — Telephone Encounter (Signed)
Notified Stacy that PCP would need to manage wound care.

## 2023-09-27 ENCOUNTER — Other Ambulatory Visit (HOSPITAL_COMMUNITY): Payer: Self-pay

## 2023-09-27 ENCOUNTER — Ambulatory Visit: Payer: Medicaid Other | Admitting: Diagnostic Neuroimaging

## 2023-09-27 ENCOUNTER — Encounter: Payer: Self-pay | Admitting: Diagnostic Neuroimaging

## 2023-09-27 VITALS — BP 130/82 | HR 76 | Ht 74.0 in | Wt 172.0 lb

## 2023-09-27 DIAGNOSIS — Z8673 Personal history of transient ischemic attack (TIA), and cerebral infarction without residual deficits: Secondary | ICD-10-CM | POA: Diagnosis not present

## 2023-09-27 NOTE — Patient Instructions (Signed)
continue current medications.  continue current medications

## 2023-09-27 NOTE — Progress Notes (Signed)
[facility]  PATIENT: Zachary Winters DOB: 04-03-1981  REFERRING CLINICIAN: Charlsie Quest, MD HISTORY FROM: patient  REASON FOR VISIT: new consult   HISTORICAL  CHIEF COMPLAINT:  Chief Complaint  Patient presents with   Cerebrovascular Accident    Rm 6 with sister Shamica Pt is well, reports he is still having R sided weakness and impaired speech. No other concerns     HISTORY OF PRESENT ILLNESS:   UPDATE (09/27/23, VRP): 42 year old male here for stroke hospital follow up. Since last visit, doing well. Now living with sister, who is primary caregiver. Language gradually improving. Right sided still very weak. Tolerating meds.   PRIOR HPI (Dr. Pearlean Brownie, 06/26/23): "42 y.o. male with history of diabetes, hypertension, hyperlipidemia and smoking who was initially [facility] with right hemiplegia and aphasia.  He presented outside the window for TNK and thrombectomy.  He was found to have a left basal ganglia infarct, which is now extended into much of the left MCA territory, and he also developed a small acute infarct in posterior limb of right internal capsule.  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  Yesterday, he was transferred here to be seen by stroke team and possibly for conventional angiogram given severe stenosis.  Admission urine toxicology was positive for THC.  Patient's grandmother states that he does not often go to the doctor and does  smoke cigarettes."  REVIEW OF SYSTEMS: Full 14 system review of systems performed and negative with exception of: as per HPI.  ALLERGIES: Allergies  Allergen Reactions   Chocolate     vomiting   Metformin Nausea And Vomiting   Penicillins Swelling    HOME MEDICATIONS: Outpatient Medications Prior to Visit  Medication Sig Dispense Refill   amLODipine (NORVASC) 10 MG tablet Take 1 tablet (10 mg total) by mouth daily. 90 tablet 1   atorvastatin (LIPITOR) 80 MG tablet Take 1 tablet (80  mg total) by mouth daily after supper. 90 tablet 1   baclofen (LIORESAL) 10 MG tablet Take 1.5 tablets (15 mg total) by mouth 3 (three) times daily. 135 tablet 1   blood glucose meter kit and supplies Dispense based on patient and insurance preference. Check blood sugar four times daily as directed. (FOR ICD-10 E10.9, E11.9). 1 each 0   carvedilol (COREG) 6.25 MG tablet Take 1 tablet (6.25 mg total) by mouth 2 (two) times daily with a meal. 90 tablet 1   clopidogrel (PLAVIX) 75 MG tablet Take 1 tablet (75 mg total) by mouth daily. 60 tablet 2   glimepiride (AMARYL) 2 MG tablet Take 1 tablet (2 mg total) by mouth daily with breakfast. 90 tablet 1   insulin glargine (LANTUS) 100 UNIT/ML Solostar Pen Inject 18 Units into the skin at bedtime. 15 mL 11   Vitamin D, Ergocalciferol, (DRISDOL) 1.25 MG (50000 UNIT) CAPS capsule Take 1 capsule (50,000 Units total) by mouth every 7 (seven) days. On Wednesdays 7 capsule 0   acetaminophen (TYLENOL) 325 MG tablet Take 1-2 tablets (325-650 mg total) by mouth every 4 (four) hours as needed for mild pain. (Patient not taking: Reported on 09/27/2023)     aspirin 81 MG chewable tablet Place 1 tablet (81 mg total) into feeding tube daily. (Patient not taking: Reported on 09/27/2023) 100 tablet 0   bisacodyl (DULCOLAX) 10 MG suppository Unwrap and place 1 suppository (10 mg total) rectally daily as needed for moderate constipation. (Patient not taking: Reported on 09/27/2023) 10  suppository 0   Insulin Pen Needle 32G X 4 MM MISC Use at bedtime as directed (Patient not taking: Reported on 09/27/2023) 100 each 0   Multiple Vitamin (MULTIVITAMIN WITH MINERALS) TABS tablet Take 1 tablet by mouth daily. (Patient not taking: Reported on 09/27/2023) 30 tablet 0   Multiple Vitamins-Minerals (CERTA-VITE PO) Take by mouth. (Patient not taking: Reported on 09/27/2023)     nicotine (NICODERM CQ - DOSED IN MG/24 HOURS) 21 mg/24hr patch Place 1 patch (21 mg total) onto the skin daily.  (Patient not taking: Reported on 09/27/2023) 30 patch 0   senna-docusate (SENOKOT-S) 8.6-50 MG tablet Take 1 tablet by mouth at bedtime. (Patient not taking: Reported on 09/27/2023) 90 tablet 1   No facility-administered medications prior to visit.    PAST MEDICAL HISTORY: Past Medical History:  Diagnosis Date   Nausea and vomiting    chronic for years.   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 No pertinent family history.  SOCIAL HISTORY: Social History   Socioeconomic History   Marital status: Single    Spouse name: Not on file   Number of children: 1   Years of education: Not on file   Highest education level: Some college, no degree  Occupational History   Not on file  Tobacco Use   Smoking status: Former    Current packs/day: 0.25    Average packs/day: 0.3 packs/day for 20.0 years (5.0 ttl pk-yrs)    Types: Cigarettes   Smokeless tobacco: Never  Vaping Use   Vaping status: Never Used  Substance and Sexual Activity   Alcohol use: Not Currently   Drug use: Not Currently    Frequency: 2.0 times per week    Types: Marijuana   Sexual activity: Yes  Other Topics Concern   Not on file  Social History Narrative   Lives with parents   Has son 6 years-[name]      Enjoy: BB and son       Diet: everything    Caffeine: tea daily    Water: 3-4 bottles of 16 oz      Wear seat belt   Wear sun protection   Smoke detectors    Does not use phone while driving        Social Determinants of Health   Financial Resource Strain: Low Risk  (10/31/2019)   Overall Financial Resource Strain (CARDIA)    Difficulty of Paying Living Expenses: Not hard at all  Food Insecurity: No Food Insecurity (10/31/2019)   Hunger Vital Sign    Worried About Running Out of Food in the Last Year: Never true    Ran Out of Food in the Last Year: Never true  Transportation Needs: No Transportation Needs (10/31/2019)   PRAPARE - Administrator, Civil Service (Medical): No    Lack of Transportation (Non-Medical): No  Physical Activity: Sufficiently Active (10/31/2019)   Exercise Vital Sign    Days of Exercise per Week:  3 days    Minutes of Exercise per Session: 60 min  Stress: No Stress Concern Present (10/31/2019)   [employer] of Occupational Health - Occupational Stress Questionnaire    Feeling of Stress : Not at all  Social Connections: Socially Isolated (10/31/2019)   Social Connection and Isolation Panel [NHANES]    Frequency of Communication with Friends and Family: More than three times a week    Frequency of Social Gatherings with Friends and Family: More than three times a week    Attends Religious Services: Never    Database administrator or Organizations: No    Attends Banker Meetings: Never    Marital Status: Divorced  Catering manager Violence: Not At Risk (10/31/2019)   Humiliation, Afraid, Rape, and Kick questionnaire    Fear of Current or Ex-Partner: No    Emotionally Abused: No    Physically Abused: No    Sexually Abused: No     PHYSICAL EXAM  GENERAL EXAM/CONSTITUTIONAL: Vitals:  Vitals:   09/27/23 1123  BP: 130/82  Pulse: 76  Weight: 172 lb (78 kg)  Height: 6\' 2"  (1.88 m)   Body mass index is 22.08 kg/m. Wt Readings from Last 3 Encounters:  09/27/23 172 lb (78 kg)  08/22/23 176 lb 2.4 oz (79.9 kg)  07/25/23 176 lb 2.4 oz (79.9 kg)   Patient is in no distress; well developed, nourished and groomed; neck is  supple  CARDIOVASCULAR: Examination of carotid arteries is normal; no carotid bruits Regular rate and rhythm, no murmurs Examination of peripheral vascular system by observation and palpation is normal  EYES: Ophthalmoscopic exam of optic discs and posterior segments is normal; no papilledema or hemorrhages No results found.  MUSCULOSKELETAL: Gait, strength, tone, movements noted in Neurologic exam below  NEUROLOGIC: MENTAL STATUS:      No data to display         awake, alert, oriented to person DECR FLUENCY; ABLE TO SAY A FEW WORDS ("OH OK, GOOD, LITTLE BIT,  FINE") ABLE TO REPEAT SLIGHTLY (PEN --> "BEN")  CRANIAL NERVE:  2nd - no papilledema on fundoscopic exam 2nd, 3rd, 4th, 6th - pupils equal and reactive to light, visual fields full to confrontation, extraocular muscles intact, no nystagmus 5th - facial sensation symmetric 7th - facial strength --> DENSE RIGHT LOWER FACIAL WEAKNESS 8th - hearing intact 9th - palate elevates symmetrically, uvula midline 11th - shoulder shrug --> ABSENT ON RIGHT 12th - tongue protrusion --> DEVIATES TO RIGHT  MOTOR:  SPASTIC, DENSE RIGHT HEMIPLEGIA (0/5) normal bulk and tone strength in the LUE, LLE  SENSORY:  DECR IN RIGHT ARM AND RIGHT LEG  COORDINATION:  finger-nose-finger, fine finger movements NORMAL ON LEFT  REFLEXES:  deep tendon reflexes --> SLIGHTLY BRISK IN RUE AND RLE  GAIT/STATION:  IN WHEEL CHAIR     DIAGNOSTIC DATA (LABS, IMAGING, TESTING) - I reviewed patient records, labs, notes, testing and imaging myself where available.  Lab Results  Component Value Date   WBC 7.7 07/24/2023   HGB 10.9 (L) 07/24/2023   HCT 33.4 (L) 07/24/2023   MCV 91.3 07/24/2023   PLT 328 07/24/2023      Component Value Date/Time   NA 136 07/24/2023 0900   K 4.6 07/24/2023 0900   CL 99 07/24/2023 0900   CO2 29 07/24/2023 0900   GLUCOSE 182 (H) 07/24/2023 0900   BUN 20 07/24/2023 0900   CREATININE 1.61 (H) 07/24/2023  0900   CREATININE 1.15 10/31/2019 0954  CALCIUM 9.3 07/24/2023 0900   PROT 6.8 06/28/2023 0552   ALBUMIN 2.6 (L) 06/28/2023 0552   AST 28 06/28/2023 0552   ALT 24 06/28/2023 0552   ALKPHOS 78 06/28/2023 0552   BILITOT 0.6 06/28/2023 0552   GFRNONAA 55 (L) 07/24/2023 0900   GFRNONAA 81 10/31/2019 0954   GFRAA 94 10/31/2019 0954   Lab Results  Component Value Date   CHOL 352 (H) 06/14/2023   HDL 46 06/14/2023   LDLCALC 265 (H) 06/14/2023   TRIG 205 (H) 06/14/2023   CHOLHDL 7.7 06/14/2023   Lab Results  Component Value Date   HGBA1C 11.7 (H) 06/14/2023   No results found for: "VITAMINB12" Lab Results  Component Value Date   TSH 0.766 06/14/2023      ASSESSMENT AND PLAN  42 y.o. year old male here with:   Dx:  1. Chronic ischemic left MCA stroke     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, etiology likely large vessel disease Stroke: small infarct in R PLIC, likely synchronized small vessel disease CT head subacute infarct in left basal ganglia with probable petechial hemorrhage MRI 6/19 acute to subacute perforator infarct left basal ganglia with petechial hemorrhage MRA severe stenosis versus segmental occlusion at the left M1 segment, left ICA smaller than right MRI 6/24 increased size of left MCA territory infarct in the left BG and overlying frontal lobe.  Similar petechial hemorrhage.  New interval small acute infarct in the right PLIC CTA head and neck severe, near occlusive stenosis in left M1 with poor perfusion of proximal M2 branches, severe stenosis in left V4, severe stenosis in left supraclinoid ICA, moderate stenosis in right supraclinoid ICA, multifocal irregularity and mild to moderate stenosis in left V1 and V2 segments, mild stenosis in right V1 and proximal V2 segments Carotid Doppler no significant plaque formation 2D Echo EF 60 to 65%  LDL 265 HgbA1c 11.7 VTE prophylaxis -Lovenox No antithrombotic prior to  admission, now on aspirin 81 mg daily and clopidogrel 75 mg daily due to severe M1 stenosis; at least 3 months post stroke, then plavix 75mg  daily alone   ICA and MCA stenosis CT head and neck severe, near occlusive stenosis in left M1 with poor perfusion of proximal M2 branches, severe stenosis in left supraclinoid ICA, moderate stenosis in right supraclinoid ICA Consider elective elective angiogram with stenting since patient is slightly improving (will discuss with Dr. Pearlean Brownie)   Hypertension Long-term BP goal normotensive --> continue current meds   Hyperlipidemia LDL 265 --> atorvastatin 80mg  daily   Diabetes type II Uncontrolled HgbA1c 11.7, goal < 7.0 DM control per PCP   Tobacco abuse Former smoker --> has quit!  Return in about 6 months (around 03/27/2024) for with Dr. Pearlean Brownie .    Suanne Marker, MD 09/27/2023, 11:36 AM Certified in Neurology, Neurophysiology and Neuroimaging  [facility] [address] [place], [place] [address] [phone]

## 2023-10-02 ENCOUNTER — Other Ambulatory Visit: Payer: Self-pay | Admitting: Physical Medicine and Rehabilitation

## 2023-10-08 ENCOUNTER — Other Ambulatory Visit: Payer: Self-pay | Admitting: Family Medicine

## 2023-10-08 DIAGNOSIS — E1159 Type 2 diabetes mellitus with other circulatory complications: Secondary | ICD-10-CM

## 2023-10-09 MED ORDER — BLOOD GLUCOSE METER KIT: PACK | 0 refills | Status: AC

## 2023-10-10 ENCOUNTER — Telehealth: Payer: Self-pay | Admitting: Family Medicine

## 2023-10-10 NOTE — Telephone Encounter (Signed)
Spoke with RN from [facility] to approve orders.

## 2023-10-10 NOTE — Telephone Encounter (Signed)
Patient home nurse calling needing okay for orders for wound care says it's urgent. Please advise [phone] Thank you

## 2023-10-20 ENCOUNTER — Encounter: Payer: Self-pay | Admitting: Physical Medicine & Rehabilitation

## 2023-10-20 ENCOUNTER — Encounter: Payer: Medicaid Other | Attending: Registered Nurse | Admitting: Physical Medicine & Rehabilitation

## 2023-10-20 VITALS — BP 133/86 | HR 87 | Ht 74.0 in

## 2023-10-20 DIAGNOSIS — I69351 Hemiplegia and hemiparesis following cerebral infarction affecting right dominant side: Secondary | ICD-10-CM | POA: Insufficient documentation

## 2023-10-20 MED ORDER — INCOBOTULINUMTOXINA 100 UNITS IM SOLR
300.0000 [IU] | Freq: Once | INTRAMUSCULAR | Status: AC
Start: 2023-10-20 — End: 2023-10-20
  Administered 2023-10-20: 300 [IU] via INTRAMUSCULAR

## 2023-10-20 NOTE — Progress Notes (Signed)
Botox Injection for spasticity using needle EMG guidance  Dilution: 50 Units/ml Indication: Severe spasticity which interferes with ADL,mobility and/or  hygiene and is unresponsive to medication management and other conservative care Informed consent was obtained after describing risks and benefits of the procedure with the patient. This includes bleeding, bruising, infection, excessive weakness, or medication side effects. A REMS form is on file and signed. Needle: 27g 1" needle electrode Number of units per muscle Right upper extremity dominant side Biceps 50 units Brachialis 50 units Brachial radialis 50 units FCR 50 units FDS 50 units FDP 50 units All injections were done after obtaining appropriate EMG activity and after negative drawback for blood. The patient tolerated the procedure well. Post procedure instructions were given. A followup appointment was made.

## 2023-10-20 NOTE — Patient Instructions (Signed)

## 2023-10-27 ENCOUNTER — Other Ambulatory Visit: Payer: Self-pay

## 2023-10-27 ENCOUNTER — Encounter: Payer: Self-pay | Admitting: Family Medicine

## 2023-10-27 DIAGNOSIS — E1140 Type 2 diabetes mellitus with diabetic neuropathy, unspecified: Secondary | ICD-10-CM

## 2023-10-27 MED ORDER — PEN NEEDLES 32G X 4 MM MISC: 2 refills | Status: DC

## 2023-10-27 MED ORDER — INSULIN GLARGINE 100 UNIT/ML SOLOSTAR PEN: 18.0000 [IU] | PEN_INJECTOR | Freq: Every day | SUBCUTANEOUS | 11 refills | Status: DC

## 2023-11-01 ENCOUNTER — Telehealth: Payer: Self-pay

## 2023-11-01 NOTE — Telephone Encounter (Signed)
The verbal okay has been given for continued Physical Therapy with [facility]. Visits to be once a week for 4 weeks.  Call back phone [phone].

## 2023-11-06 ENCOUNTER — Telehealth: Payer: Self-pay

## 2023-11-06 ENCOUNTER — Other Ambulatory Visit: Payer: Self-pay | Admitting: Registered Nurse

## 2023-11-06 NOTE — Telephone Encounter (Signed)
Was there a PA form sent from [employer] [facility] for personal care services for this patient ? Insurance called asking.   [phone] [facility]

## 2023-11-14 ENCOUNTER — Other Ambulatory Visit (HOSPITAL_COMMUNITY): Payer: Self-pay

## 2023-11-15 LAB — HM DIABETES EYE EXAM

## 2023-11-18 ENCOUNTER — Other Ambulatory Visit: Payer: Self-pay | Admitting: Family Medicine

## 2023-11-18 DIAGNOSIS — I10 Essential (primary) hypertension: Secondary | ICD-10-CM

## 2023-11-21 ENCOUNTER — Ambulatory Visit: Payer: Medicaid Other | Admitting: Family Medicine

## 2023-11-21 ENCOUNTER — Telehealth: Payer: Self-pay

## 2023-11-21 ENCOUNTER — Encounter: Payer: Self-pay | Admitting: Family Medicine

## 2023-11-21 VITALS — BP 118/83 | HR 88

## 2023-11-21 DIAGNOSIS — E1165 Type 2 diabetes mellitus with hyperglycemia: Secondary | ICD-10-CM | POA: Diagnosis not present

## 2023-11-21 DIAGNOSIS — E1169 Type 2 diabetes mellitus with other specified complication: Secondary | ICD-10-CM

## 2023-11-21 DIAGNOSIS — I10 Essential (primary) hypertension: Secondary | ICD-10-CM

## 2023-11-21 DIAGNOSIS — I63519 Cerebral infarction due to unspecified occlusion or stenosis of unspecified middle cerebral artery: Secondary | ICD-10-CM

## 2023-11-21 DIAGNOSIS — Z794 Long term (current) use of insulin: Secondary | ICD-10-CM

## 2023-11-21 DIAGNOSIS — E785 Hyperlipidemia, unspecified: Secondary | ICD-10-CM

## 2023-11-21 NOTE — Assessment & Plan Note (Signed)
Stable on atorvastatin 80 mg daily Encouraged to decrease his intake of greasy, fatty, starchy foods and increase physical activity Pending labs

## 2023-11-21 NOTE — Assessment & Plan Note (Addendum)
Controlled Encouraged to continue take amlodipine 10 mg daily and carvedilol 6.25 mg twice daily Currently asymptomatic in the clinic Low-sodium diet with increased physical activity encouraged BP Readings from Last 3 Encounters:  11/21/23 118/83  10/20/23 133/86  09/27/23 130/82

## 2023-11-21 NOTE — Assessment & Plan Note (Signed)
The patient is currently under the care of neurology and followed up  on 09/27/2023. He is on dual antiplatelet therapy for 3 months post-stroke, and after completing this regimen, he will continue with Plavix 75 mg daily. The patient has been encouraged to follow up with his neurologist as scheduled. Additionally, he will resume physical therapy in the next few weeks to support his recovery and rehabilitation.

## 2023-11-21 NOTE — Progress Notes (Signed)
Established Patient Office Visit  Subjective:  Patient ID: Zachary Winters, male    DOB: [birth year]  Age: 42 y.o. MRN: [id number]  CC:  Chief Complaint  Patient presents with   Follow-up    HPI Zachary Winters is a 42 y.o. male with past medical history of hypertension, hyperlipidemia, type 2 diabetes presents for f/u of  chronic medical conditions. For the details of today's visit, please refer to the assessment and plan.     Past Medical History:  Diagnosis Date   Nausea and vomiting    chronic for years.   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 No pertinent family history.  Social History   Socioeconomic History   Marital status: Single    Spouse name: Not on file   Number of children: 1   Years of education: Not on file   Highest education level: Some college, no degree  Occupational History   Not on file  Tobacco Use   Smoking status: Former    Current packs/day: 0.25    Average packs/day: 0.3 packs/day for 20.0 years (5.0 ttl pk-yrs)    Types: Cigarettes   Smokeless tobacco: Never  Vaping Use   Vaping status: Never Used  Substance and Sexual Activity   Alcohol use: Not Currently   Drug use: Not Currently    Frequency: 2.0 times per week    Types: Marijuana   Sexual activity: Yes  Other Topics Concern   Not on file  Social History Narrative   Lives with parents   Has son 6 years-[name]      Enjoy: BB and son       Diet: everything    Caffeine: tea daily    Water: 3-4 bottles of 16  oz      Wear seat belt   Wear sun protection   Smoke detectors    Does not use phone while driving       Social Determinants of Health   Financial Resource Strain: Low Risk  (10/31/2019)   Overall Financial Resource Strain (CARDIA)    Difficulty of Paying Living Expenses: Not hard at all  Food Insecurity: No Food Insecurity (10/31/2019)   Hunger Vital Sign    Worried About Running Out of Food in the Last Year: Never true    Ran Out of Food in the Last Year: Never true  Transportation Needs: No Transportation Needs (10/31/2019)   PRAPARE - Administrator, Civil Service (Medical): No    Lack of Transportation (Non-Medical): No  Physical Activity: Sufficiently Active (10/31/2019)   Exercise Vital Sign    Days of Exercise per Week: 3 days    Minutes of Exercise per Session: 60 min  Stress: No Stress Concern Present (10/31/2019)   [employer] of Occupational Health - Occupational Stress Questionnaire    Feeling of Stress : Not at all  Social Connections: Socially Isolated (10/31/2019)   Social Connection and Isolation Panel [NHANES]    Frequency of Communication  with Friends and Family: More than three times a week    Frequency of Social Gatherings with Friends and Family: More than three times a week    Attends Religious Services: Never    Database administrator or Organizations: No    Attends Banker Meetings: Never    Marital Status: Divorced  Catering manager Violence: Not At Risk (10/31/2019)   Humiliation, Afraid, Rape, and Kick questionnaire    Fear of Current or Ex-Partner: No    Emotionally Abused: No    Physically Abused: No    Sexually Abused: No    Outpatient Medications Prior to Visit  Medication Sig Dispense Refill   amLODipine (NORVASC) 10 MG tablet Take 1 tablet (10 mg total) by mouth daily. 90 tablet 1   atorvastatin (LIPITOR) 80 MG tablet Take 1 tablet (80 mg total) by mouth daily after supper. 90 tablet 1   baclofen (LIORESAL) 10 MG  tablet TAKE 1 AND 1/2 TABLETS(15 MG) BY MOUTH THREE TIMES DAILY 135 tablet 1   blood glucose meter kit and supplies Dispense based on patient and insurance preference. Check blood sugar four times daily as directed. (FOR ICD-10 E10.9, E11.9). 1 each 0   carvedilol (COREG) 6.25 MG tablet TAKE 1 TABLET(6.25 MG) BY MOUTH TWICE DAILY WITH A MEAL 90 tablet 1   clopidogrel (PLAVIX) 75 MG tablet Take 1 tablet (75 mg total) by mouth daily. 60 tablet 2   glimepiride (AMARYL) 2 MG tablet Take 1 tablet (2 mg total) by mouth daily with breakfast. 90 tablet 1   insulin glargine (LANTUS) 100 UNIT/ML Solostar Pen Inject 18 Units into the skin at bedtime. 15 mL 11   Insulin Pen Needle (PEN NEEDLES) 32G X 4 MM MISC pen needle BD Nano Ultra Fine 32G x 4mm DX: e11.40 100 each 2   Vitamin D, Ergocalciferol, (DRISDOL) 1.25 MG (50000 UNIT) CAPS capsule Take 1 capsule (50,000 Units total) by mouth every 7 (seven) days. On Wednesdays 7 capsule 0   No facility-administered medications prior to visit.    Allergies  Allergen Reactions   Chocolate     vomiting   Metformin Nausea And Vomiting   Penicillins Swelling    ROS Review of Systems  Constitutional:  Negative for fatigue and fever.  Eyes:  Negative for visual disturbance.  Respiratory:  Negative for chest tightness and shortness of breath.   Cardiovascular:  Negative for chest pain and palpitations.  Neurological:  Negative for dizziness and headaches.      Objective:    Physical Exam HENT:     Head: Normocephalic.     Right Ear: External ear normal.     Left Ear: External ear normal.     Nose: No congestion or rhinorrhea.     Mouth/Throat:     Mouth: Mucous membranes are moist.  Cardiovascular:     Rate and Rhythm: Regular rhythm.     Heart sounds: No murmur heard. Pulmonary:     Effort: No respiratory distress.     Breath sounds: Normal breath sounds.  Neurological:     Mental Status: He is alert.     Comments: Right-sided weakness noted      BP 118/83   Pulse 88   SpO2 100%  Wt Readings from Last 3 Encounters:  09/27/23 172 lb (78 kg)  08/22/23 176 lb 2.4 oz (79.9 kg)  07/25/23 176 lb 2.4 oz (79.9 kg)    Lab Results  Component Value Date   TSH 0.766  06/14/2023   Lab Results  Component Value Date   WBC 7.7 07/24/2023   HGB 10.9 (L) 07/24/2023   HCT 33.4 (L) 07/24/2023   MCV 91.3 07/24/2023   PLT 328 07/24/2023   Lab Results  Component Value Date   NA 136 07/24/2023   K 4.6 07/24/2023   CO2 29 07/24/2023   GLUCOSE 182 (H) 07/24/2023   BUN 20 07/24/2023   CREATININE 1.61 (H) 07/24/2023   BILITOT 0.6 06/28/2023   ALKPHOS 78 06/28/2023   AST 28 06/28/2023   ALT 24 06/28/2023   PROT 6.8 06/28/2023   ALBUMIN 2.6 (L) 06/28/2023   CALCIUM 9.3 07/24/2023   ANIONGAP 8 07/24/2023   Lab Results  Component Value Date   CHOL 352 (H) 06/14/2023   Lab Results  Component Value Date   HDL 46 06/14/2023   Lab Results  Component Value Date   LDLCALC 265 (H) 06/14/2023   Lab Results  Component Value Date   TRIG 205 (H) 06/14/2023   Lab Results  Component Value Date   CHOLHDL 7.7 06/14/2023   Lab Results  Component Value Date   HGBA1C 11.7 (H) 06/14/2023      Assessment & Plan:  Essential hypertension Assessment & Plan: Controlled Encouraged to continue take amlodipine 10 mg daily and carvedilol 6.25 mg twice daily Currently asymptomatic in the clinic Low-sodium diet with increased physical activity encouraged BP Readings from Last 3 Encounters:  11/21/23 118/83  10/20/23 133/86  09/27/23 130/82      Cerebrovascular accident (CVA) due to stenosis of middle cerebral artery, unspecified blood vessel laterality (HCC) Assessment & Plan: The patient is currently under the care of neurology and followed up  on 09/27/2023. He is on dual antiplatelet therapy for 3 months post-stroke, and after completing this regimen, he will continue with Plavix 75 mg daily. The patient has been encouraged to  follow up with his neurologist as scheduled. Additionally, he will resume physical therapy in the next few weeks to support his recovery and rehabilitation.    Hyperlipidemia associated with type 2 diabetes mellitus (HCC) Assessment & Plan: Stable on atorvastatin 80 mg daily Encouraged to decrease his intake of greasy, fatty, starchy foods and increase physical activity Pending labs   Type 2 diabetes mellitus with hyperglycemia, with long-term current use of insulin (HCC) Assessment & Plan: The patient is on Lantus 15 units at bedtime and glimepiride 2 mg daily No reports of polyuria, polyphagia, and polydipsia Encouraged to decrease his intake of high sugar foods and beverages with increase physical activity Pending hemoglobin A1c   Note: This chart has been completed using Engineer, civil (consulting) software, and while attempts have been made to ensure accuracy, certain words and phrases may not be transcribed as intended.    Follow-up: Return in about 4 months (around 03/20/2024).   Gilmore Laroche, FNP

## 2023-11-21 NOTE — Assessment & Plan Note (Signed)
The patient is on Lantus 15 units at bedtime and glimepiride 2 mg daily No reports of polyuria, polyphagia, and polydipsia Encouraged to decrease his intake of high sugar foods and beverages with increase physical activity Pending hemoglobin A1c

## 2023-11-21 NOTE — Telephone Encounter (Signed)
Marylene Land RN with [facility] is seeking verbal orders: For right ankle wound care. Once a week for nine weeks then PRN for 3 weeks.   Caller has been advised to call the PCP for orders. Please advise.   Call back phone [phone].

## 2023-11-21 NOTE — Patient Instructions (Signed)

## 2023-11-22 LAB — CBC WITH DIFFERENTIAL/PLATELET
Basophils Absolute: 0 10*3/uL (ref 0.0–0.2)
Basos: 1 %
EOS (ABSOLUTE): 0.1 10*3/uL (ref 0.0–0.4)
Eos: 2 %
Hematocrit: 33.4 % — ABNORMAL LOW (ref 37.5–51.0)
Hemoglobin: 11.1 g/dL — ABNORMAL LOW (ref 13.0–17.7)
Immature Grans (Abs): 0 10*3/uL (ref 0.0–0.1)
Immature Granulocytes: 0 %
Lymphocytes Absolute: 1.4 10*3/uL (ref 0.7–3.1)
Lymphs: 21 %
MCH: 29.3 pg (ref 26.6–33.0)
MCHC: 33.2 g/dL (ref 31.5–35.7)
MCV: 88 fL (ref 79–97)
Monocytes Absolute: 0.3 10*3/uL (ref 0.1–0.9)
Monocytes: 4 %
Neutrophils Absolute: 4.8 10*3/uL (ref 1.4–7.0)
Neutrophils: 72 %
Platelets: 319 10*3/uL (ref 150–450)
RBC: 3.79 x10E6/uL — ABNORMAL LOW (ref 4.14–5.80)
RDW: 12.9 % (ref 11.6–15.4)
WBC: 6.6 10*3/uL (ref 3.4–10.8)

## 2023-11-22 LAB — CMP14+EGFR
ALT: 35 [IU]/L (ref 0–44)
AST: 23 [IU]/L (ref 0–40)
Albumin: 4 g/dL — ABNORMAL LOW (ref 4.1–5.1)
Alkaline Phosphatase: 159 [IU]/L — ABNORMAL HIGH (ref 44–121)
BUN/Creatinine Ratio: 17 (ref 9–20)
BUN: 26 mg/dL — ABNORMAL HIGH (ref 6–24)
Bilirubin Total: 0.3 mg/dL (ref 0.0–1.2)
CO2: 25 mmol/L (ref 20–29)
Calcium: 9.2 mg/dL (ref 8.7–10.2)
Chloride: 103 mmol/L (ref 96–106)
Creatinine, Ser: 1.57 mg/dL — ABNORMAL HIGH (ref 0.76–1.27)
Globulin, Total: 3.5 g/dL (ref 1.5–4.5)
Glucose: 69 mg/dL — ABNORMAL LOW (ref 70–99)
Potassium: 4.3 mmol/L (ref 3.5–5.2)
Sodium: 144 mmol/L (ref 134–144)
Total Protein: 7.5 g/dL (ref 6.0–8.5)
eGFR: 57 mL/min/{1.73_m2} — ABNORMAL LOW (ref >59)

## 2023-11-22 LAB — LIPID PANEL
Chol/HDL Ratio: 4.3 {ratio} (ref 0.0–5.0)
Cholesterol, Total: 167 mg/dL (ref 100–199)
HDL: 39 mg/dL — ABNORMAL LOW (ref >39)
LDL Chol Calc (NIH): 114 mg/dL — ABNORMAL HIGH (ref 0–99)
Triglycerides: 70 mg/dL (ref 0–149)
VLDL Cholesterol Cal: 14 mg/dL (ref 5–40)

## 2023-11-22 LAB — HEMOGLOBIN A1C
Est. average glucose Bld gHb Est-mCnc: 114 mg/dL
Hgb A1c MFr Bld: 5.6 % (ref 4.8–5.6)

## 2023-11-22 LAB — VITAMIN D 25 HYDROXY (VIT D DEFICIENCY, FRACTURES): Vit D, 25-Hydroxy: 32.6 ng/mL (ref 30.0–100.0)

## 2023-11-22 LAB — TSH+FREE T4
Free T4: 1.28 ng/dL (ref 0.82–1.77)
TSH: 1.72 u[IU]/mL (ref 0.450–4.500)

## 2023-11-22 LAB — HIV ANTIBODY (ROUTINE TESTING W REFLEX): HIV Screen 4th Generation wRfx: NONREACTIVE

## 2023-11-22 LAB — HEPATITIS C ANTIBODY: Hep C Virus Ab: NONREACTIVE

## 2023-11-22 NOTE — Telephone Encounter (Signed)
Call placed to [facility] for verbal wound orders per Dr Wynn Banker

## 2023-11-22 NOTE — Telephone Encounter (Signed)
Task completed

## 2023-11-28 ENCOUNTER — Telehealth: Payer: Self-pay

## 2023-11-28 NOTE — Telephone Encounter (Signed)
Copied from CRM [id number]. Topic: Clinical - Home Health Verbal Orders >> Nov 28, 2023  4:01 PM Fuller Mandril wrote: Caller/Agency: Lafonda Mosses with [facility] (payor) Callback Number: Phone: [phone]  Fax: [phone] Service Requested: Personal Care Services - Insurance calling to check status on authorization request. Has not yet received requesting info.  Frequency:  Any new concerns about the patient? N/A

## 2023-11-28 NOTE — Telephone Encounter (Signed)
Lmtrc to Lafonda Mosses for more information

## 2023-11-30 NOTE — Telephone Encounter (Signed)
Second attempt

## 2023-12-01 ENCOUNTER — Encounter: Payer: Self-pay | Admitting: Physical Medicine & Rehabilitation

## 2023-12-01 ENCOUNTER — Encounter: Payer: Medicaid Other | Attending: Registered Nurse | Admitting: Physical Medicine & Rehabilitation

## 2023-12-01 ENCOUNTER — Other Ambulatory Visit: Payer: Self-pay | Admitting: Family Medicine

## 2023-12-01 VITALS — BP 132/86 | HR 81 | Ht 74.0 in

## 2023-12-01 DIAGNOSIS — I69351 Hemiplegia and hemiparesis following cerebral infarction affecting right dominant side: Secondary | ICD-10-CM | POA: Diagnosis present

## 2023-12-01 DIAGNOSIS — E7849 Other hyperlipidemia: Secondary | ICD-10-CM

## 2023-12-01 MED ORDER — EZETIMIBE 10 MG PO TABS
10.0000 mg | ORAL_TABLET | Freq: Every day | ORAL | 3 refills | Status: AC
Start: 2023-12-01 — End: ?

## 2023-12-01 NOTE — Progress Notes (Signed)
Subjective:    Patient ID: Zachary Winters, male    DOB: Jun 25, 1981, 42 y.o.   MRN: [id number] Admit date: 06/27/2023 Discharge date: 07/25/2023  42 y.o. male with history of poorly controlled DM with neuropathy, HTN, tobacco use who was admitted to [facility] on 06/14/2023 with decreased responsiveness x 2 days.  He was found by sister with right-sided weakness, facial droop with inability to speak as well as reports of being on floor 2 days prior to admission with elevated blood sugars and then was fatigued and lying around.  UDS positive for THC.  He was out of window for TNK.  MRI/MRI brain done showing acute to subacute perforator infarct left basal ganglia with underlying severe stenosis versus segmental occlusion of left M1 segment, left ICA noted to be smaller than right and neck atrial imaging recommended to rule out proximal stenosis.  Carotid Dopplers done which were negative for significant ICA stenosis.  TEE done showing EF of 55 to 60% with normal LVEF and moderate concentric LVH, tricuspid AV and was negative for thrombus or ideation.  Hospital course was significant for decreased energy levels as well as emesis and poor p.o. intake.     Dr. Melynda Ripple recommended DAPT x 3 months stroke was felt to be likely due to M1 occlusion.  CT chest, abdomen pelvis ordered to rule out occult malignancy and was negative for this but showed advanced 3 vessel coronary artery calcification, atherosclerotic calcification in aorta and iliac arteries as well as concerns of chronic atypical infection.  Hypercoagulopathy panel was negative.  Plans for left MCA stenting however procedure postponed pending reasonable improvement after CIR.  Core track was placed for nutritional support due to issues with coughing and difficulty managing secretion.  MBS was repeated on 06/28 and he was started on dysphagia 1 with honey liquids.  Ergocalciferol added due to vitamin D deficiency.  His mentation was improving with increase ability  to produce sounds, blood sugars were noted to be poorly controlled, he was found to have core track with vomitus in mouth therefore this was DC'd prior to discharge.  Intake was poor as he was refusing pured foods.  Therapy was working with patient was limited by right hemiplegia with left inattention, expressive/receptive aphasia, emerging spasticity and was working on pre-gait activity HPI  42 yo male with hx of large Left MCA infarct approximately 6 months ago who is here for spasticity management.  He remains severely aphasic expressive greater than receptive. He is here with his sister. He still is not wearing his wrist hand orthosis much.  We discussed the importance of doing so He continues to require a transport chair for office visits.  10/20/23  Botox injection  Biceps 50 units Brachialis 50 units Brachial radialis 50 units FCR 50 units FDS 50 units FDP 50 units  Pain Inventory Average Pain 0 Pain Right Now 0 My pain is burning, stabbing, and tingling  In the last 24 hours, has pain interfered with the following? General activity 0 Relation with others 0 Enjoyment of life 0 What TIME of day is your pain at its worst? morning  Sleep (in general) Fair  Pain is worse with: unsure Pain improves with: medication Relief from Meds: 1  No family history on file. Social History   Socioeconomic History   Marital status: Single    Spouse name: Not on file   Number of children: 1   Years of education: Not on file   Highest education level: Some  college, no degree  Occupational History   Not on file  Tobacco Use   Smoking status: Former    Current packs/day: 0.25    Average packs/day: 0.3 packs/day for 20.0 years (5.0 ttl pk-yrs)    Types: Cigarettes   Smokeless tobacco: Never  Vaping Use   Vaping status: Never Used  Substance and Sexual Activity   Alcohol use: Not Currently   Drug use: Not Currently    Frequency: 2.0 times per week    Types: Marijuana   Sexual  activity: Yes  Other Topics Concern   Not on file  Social History Narrative   Lives with parents   Has son 6 years-Zachary Winters      Enjoy: BB and son       Diet: everything    Caffeine: tea daily    Water: 3-4 bottles of 16 oz      Wear seat belt   Wear sun protection   Smoke detectors    Does not use phone while driving       Social Determinants of Health   Financial Resource Strain: Low Risk  (10/31/2019)   Overall Financial Resource Strain (CARDIA)    Difficulty of Paying Living Expenses: Not hard at all  Food Insecurity: No Food Insecurity (10/31/2019)   Hunger Vital Sign    Worried About Running Out of Food in the Last Year: Never true    Ran Out of Food in the Last Year: Never true  Transportation Needs: No Transportation Needs (10/31/2019)   PRAPARE - Administrator, Civil Service (Medical): No    Lack of Transportation (Non-Medical): No  Physical Activity: Sufficiently Active (10/31/2019)   Exercise Vital Sign    Days of Exercise per Week: 3 days    Minutes of Exercise per Session: 60 min  Stress: No Stress Concern Present (10/31/2019)   [employer] of Occupational Health - Occupational Stress Questionnaire    Feeling of Stress : Not at all  Social Connections: Socially Isolated (10/31/2019)   Social Connection and Isolation Panel [NHANES]    Frequency of Communication with Friends and Family: More than three times a week    Frequency of Social Gatherings with Friends and Family: More than three times a week    Attends Religious Services: Never    Database administrator or Organizations: No    Attends Banker Meetings: Never    Marital Status: Divorced   Past Surgical History:  Procedure Laterality Date   TEE WITHOUT CARDIOVERSION N/A 06/16/2023   Procedure: TRANSESOPHAGEAL ECHOCARDIOGRAM (TEE);  Surgeon: Jonelle Sidle, MD;  Location: [facility];  Service: Cardiovascular;  Laterality: N/A;   Past Surgical History:  Procedure Laterality  Date   TEE WITHOUT CARDIOVERSION N/A 06/16/2023   Procedure: TRANSESOPHAGEAL ECHOCARDIOGRAM (TEE);  Surgeon: Jonelle Sidle, MD;  Location: [facility];  Service: Cardiovascular;  Laterality: N/A;   Past Medical History:  Diagnosis Date   Nausea and vomiting    chronic for years.   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)    decreased vision--has been getting injections   Type 1 diabetes mellitus (HCC)    Diagnosed at age 20 (has been on insulin every since dx)   BP 132/86   Pulse 81   Ht 6\' 2"  (1.88 m)   SpO2 97%   BMI 22.08 kg/m   Opioid Risk Score:   Fall Risk Score:  `  1  Depression screen PHQ 2/9     12/01/2023    3:30 PM 09/15/2023   11:14 AM 08/22/2023    9:38 AM 08/09/2023    1:39 PM 01/21/2021   11:15 AM 01/01/2020    3:06 PM 10/31/2019    8:29 AM  Depression screen PHQ 2/9  Decreased Interest 0 0 0 0 1 0 0  Down, Depressed, Hopeless 0 0 0 0 2 0 2  PHQ - 2 Score 0 0 0 0 3 0 2  Altered sleeping   0 0 2  2  Tired, decreased energy   0 0 1  2  Change in appetite   0 0 2  0  Feeling bad or failure about yourself    0 0 0  0  Trouble concentrating   0 0 1  0  Moving slowly or fidgety/restless   0 0 0  0  Suicidal thoughts   0 0 0  0  PHQ-9 Score   0 0 9  6  Difficult doing work/chores   Not difficult at all          Review of Systems  Musculoskeletal:  Positive for gait problem.       Right arm pain  All other systems reviewed and are negative.     Objective:   Physical Exam Speech is able to answer yes/no questions. He can follow simple commands. Tone MAS 3 at the elbow flexors MAS 2 at the wrist flexors MAS 2 at the finger flexors but he does have pain with finger extension passively.  He also has elevated tone at the pectoralis MAS 3 Motor strength is 3 - at the elbow flexors 0 elbow extensors 0 at the finger and wrist flexors and extensors.       Assessment & Plan:   1.  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

## 2023-12-08 ENCOUNTER — Telehealth: Payer: Self-pay

## 2023-12-08 NOTE — Telephone Encounter (Signed)
Copied from CRM [id number]. Topic: General - Billing Inquiry >> Dec 01, 2023  1:31 PM Joanette Gula wrote: Zachary Winters w/ [facility] (Care Coordinator) Ph: [phone] [phone]   Regarding PA for Long Term Aide. She states that she's been waiting on this Prior Auth for a month now.

## 2023-12-12 NOTE — Telephone Encounter (Signed)
I believe the PCS form was completed months ago

## 2023-12-18 ENCOUNTER — Encounter: Payer: Self-pay | Admitting: Family Medicine

## 2023-12-18 NOTE — Telephone Encounter (Signed)
 Care team updated and letter sent for eye exam notes.

## 2023-12-29 ENCOUNTER — Encounter: Payer: Self-pay | Admitting: Family Medicine

## 2024-01-11 ENCOUNTER — Other Ambulatory Visit: Payer: Self-pay | Admitting: Physical Medicine & Rehabilitation

## 2024-01-12 ENCOUNTER — Other Ambulatory Visit: Payer: Self-pay | Admitting: Family Medicine

## 2024-01-12 ENCOUNTER — Ambulatory Visit: Payer: Medicaid Other | Admitting: Physical Medicine & Rehabilitation

## 2024-01-12 DIAGNOSIS — E11319 Type 2 diabetes mellitus with unspecified diabetic retinopathy without macular edema: Secondary | ICD-10-CM

## 2024-01-18 ENCOUNTER — Encounter: Payer: Self-pay | Admitting: Physical Medicine & Rehabilitation

## 2024-01-18 ENCOUNTER — Encounter: Payer: Medicaid Other | Attending: Registered Nurse | Admitting: Physical Medicine & Rehabilitation

## 2024-01-18 VITALS — BP 137/84 | HR 92 | Ht 74.0 in

## 2024-01-18 DIAGNOSIS — I69351 Hemiplegia and hemiparesis following cerebral infarction affecting right dominant side: Secondary | ICD-10-CM | POA: Insufficient documentation

## 2024-01-18 MED ORDER — ONABOTULINUMTOXINA 100 UNITS IJ SOLR
400.0000 [IU] | Freq: Once | INTRAMUSCULAR | Status: AC
  Administered 2024-01-18: 400 [IU] via INTRAMUSCULAR

## 2024-01-18 MED ORDER — SODIUM CHLORIDE (PF) 0.9 % IJ SOLN
8.0000 mL | Freq: Once | INTRAMUSCULAR | Status: AC
  Administered 2024-01-18: 8 mL

## 2024-01-18 NOTE — Patient Instructions (Signed)

## 2024-01-18 NOTE — Progress Notes (Signed)
Botox Injection for spasticity using needle EMG guidance  Dilution: 50 Units/ml Indication: Severe spasticity which interferes with ADL,mobility and/or  hygiene and is unresponsive to medication management and other conservative care Informed consent was obtained after describing risks and benefits of the procedure with the patient. This includes bleeding, bruising, infection, excessive weakness, or medication side effects. A REMS form is on file and signed. Needle: 27g 1" needle electrode Number of units per muscle Right upper extremity dominant side Botox  Pectoralis 50U Biceps 75 units Brachialis 75 units Brachial radialis 50 units FCR 50 units FDS 50 units FDP 50 units All injections were done after obtaining appropriate EMG activity and after negative drawback for blood. The patient tolerated the procedure well. Post procedure instructions were given. A followup appointment was made.

## 2024-02-05 ENCOUNTER — Emergency Department (HOSPITAL_COMMUNITY): Payer: Medicaid Other

## 2024-02-05 ENCOUNTER — Observation Stay (HOSPITAL_COMMUNITY)
Admission: EM | Admit: 2024-02-05 | Discharge: 2024-02-06 | Disposition: A | Discharge status: Home Health | Payer: Medicaid Other | Attending: Emergency Medicine | Admitting: Emergency Medicine

## 2024-02-05 ENCOUNTER — Other Ambulatory Visit: Payer: Self-pay

## 2024-02-05 ENCOUNTER — Observation Stay (HOSPITAL_COMMUNITY)
Admit: 2024-02-05 | Discharge: 2024-02-05 | Disposition: A | Discharge status: Home / Self Care | Payer: Medicaid Other | Attending: Internal Medicine

## 2024-02-05 DIAGNOSIS — E785 Hyperlipidemia, unspecified: Secondary | ICD-10-CM | POA: Diagnosis not present

## 2024-02-05 DIAGNOSIS — Z87891 Personal history of nicotine dependence: Secondary | ICD-10-CM | POA: Diagnosis not present

## 2024-02-05 DIAGNOSIS — I10 Essential (primary) hypertension: Secondary | ICD-10-CM | POA: Diagnosis present

## 2024-02-05 DIAGNOSIS — Z794 Long term (current) use of insulin: Secondary | ICD-10-CM | POA: Diagnosis not present

## 2024-02-05 DIAGNOSIS — R569 Unspecified convulsions: Secondary | ICD-10-CM | POA: Diagnosis not present

## 2024-02-05 DIAGNOSIS — E1169 Type 2 diabetes mellitus with other specified complication: Secondary | ICD-10-CM | POA: Diagnosis present

## 2024-02-05 DIAGNOSIS — Z7982 Long term (current) use of aspirin: Secondary | ICD-10-CM | POA: Diagnosis not present

## 2024-02-05 DIAGNOSIS — R4701 Aphasia: Secondary | ICD-10-CM | POA: Diagnosis present

## 2024-02-05 DIAGNOSIS — I63512 Cerebral infarction due to unspecified occlusion or stenosis of left middle cerebral artery: Principal | ICD-10-CM | POA: Insufficient documentation

## 2024-02-05 DIAGNOSIS — Z7902 Long term (current) use of antithrombotics/antiplatelets: Secondary | ICD-10-CM | POA: Insufficient documentation

## 2024-02-05 DIAGNOSIS — E860 Dehydration: Secondary | ICD-10-CM | POA: Diagnosis present

## 2024-02-05 DIAGNOSIS — E1140 Type 2 diabetes mellitus with diabetic neuropathy, unspecified: Secondary | ICD-10-CM | POA: Diagnosis present

## 2024-02-05 DIAGNOSIS — I152 Hypertension secondary to endocrine disorders: Secondary | ICD-10-CM | POA: Diagnosis present

## 2024-02-05 DIAGNOSIS — G40109 Localization-related (focal) (partial) symptomatic epilepsy and epileptic syndromes with simple partial seizures, not intractable, without status epilepticus: Secondary | ICD-10-CM

## 2024-02-05 DIAGNOSIS — R531 Weakness: Principal | ICD-10-CM

## 2024-02-05 DIAGNOSIS — Z79899 Other long term (current) drug therapy: Secondary | ICD-10-CM | POA: Diagnosis not present

## 2024-02-05 DIAGNOSIS — Z20822 Contact with and (suspected) exposure to covid-19: Secondary | ICD-10-CM | POA: Diagnosis not present

## 2024-02-05 DIAGNOSIS — N179 Acute kidney failure, unspecified: Secondary | ICD-10-CM | POA: Diagnosis not present

## 2024-02-05 DIAGNOSIS — I639 Cerebral infarction, unspecified: Secondary | ICD-10-CM

## 2024-02-05 LAB — CBG MONITORING, ED
Glucose-Capillary: 105 mg/dL — ABNORMAL HIGH (ref 70–99)
Glucose-Capillary: 71 mg/dL (ref 70–99)

## 2024-02-05 LAB — CBC
HCT: 32.5 % — ABNORMAL LOW (ref 39.0–52.0)
Hemoglobin: 10.7 g/dL — ABNORMAL LOW (ref 13.0–17.0)
MCH: 29.6 pg (ref 26.0–34.0)
MCHC: 32.9 g/dL (ref 30.0–36.0)
MCV: 89.8 fL (ref 80.0–100.0)
Platelets: 228 10*3/uL (ref 150–400)
RBC: 3.62 MIL/uL — ABNORMAL LOW (ref 4.22–5.81)
RDW: 13.5 % (ref 11.5–15.5)
WBC: 7.6 10*3/uL (ref 4.0–10.5)
nRBC: 0 % (ref 0.0–0.2)

## 2024-02-05 LAB — DIFFERENTIAL
Abs Immature Granulocytes: 0.02 10*3/uL (ref 0.00–0.07)
Basophils Absolute: 0 10*3/uL (ref 0.0–0.1)
Basophils Relative: 0 %
Eosinophils Absolute: 0.2 10*3/uL (ref 0.0–0.5)
Eosinophils Relative: 2 %
Immature Granulocytes: 0 %
Lymphocytes Relative: 26 %
Lymphs Abs: 1.9 10*3/uL (ref 0.7–4.0)
Monocytes Absolute: 0.4 10*3/uL (ref 0.1–1.0)
Monocytes Relative: 5 %
Neutro Abs: 5.1 10*3/uL (ref 1.7–7.7)
Neutrophils Relative %: 67 %

## 2024-02-05 LAB — RAPID URINE DRUG SCREEN, HOSP PERFORMED
Amphetamines: NOT DETECTED
Barbiturates: NOT DETECTED
Benzodiazepines: NOT DETECTED
Cocaine: NOT DETECTED
Opiates: NOT DETECTED
Tetrahydrocannabinol: NOT DETECTED

## 2024-02-05 LAB — COMPREHENSIVE METABOLIC PANEL
ALT: 74 U/L — ABNORMAL HIGH (ref 0–44)
AST: 40 U/L (ref 15–41)
Albumin: 3.6 g/dL (ref 3.5–5.0)
Alkaline Phosphatase: 99 U/L (ref 38–126)
Anion gap: 7 (ref 5–15)
BUN: 37 mg/dL — ABNORMAL HIGH (ref 6–20)
CO2: 24 mmol/L (ref 22–32)
Calcium: 8.9 mg/dL (ref 8.9–10.3)
Chloride: 109 mmol/L (ref 98–111)
Creatinine, Ser: 2.05 mg/dL — ABNORMAL HIGH (ref 0.61–1.24)
GFR, Estimated: 41 mL/min — ABNORMAL LOW (ref >60)
Glucose, Bld: 101 mg/dL — ABNORMAL HIGH (ref 70–99)
Potassium: 4.2 mmol/L (ref 3.5–5.1)
Sodium: 140 mmol/L (ref 135–145)
Total Bilirubin: 0.3 mg/dL (ref 0.0–1.2)
Total Protein: 7.4 g/dL (ref 6.5–8.1)

## 2024-02-05 LAB — RESP PANEL BY RT-PCR (RSV, FLU A&B, COVID)  RVPGX2
Influenza A by PCR: NEGATIVE
Influenza B by PCR: NEGATIVE
Resp Syncytial Virus by PCR: NEGATIVE
SARS Coronavirus 2 by RT PCR: NEGATIVE

## 2024-02-05 LAB — URINALYSIS, ROUTINE W REFLEX MICROSCOPIC
Bacteria, UA: NONE SEEN
Bilirubin Urine: NEGATIVE
Glucose, UA: NEGATIVE mg/dL
Ketones, ur: NEGATIVE mg/dL
Leukocytes,Ua: NEGATIVE
Nitrite: NEGATIVE
Protein, ur: 100 mg/dL — AB
Specific Gravity, Urine: 1.021 (ref 1.005–1.030)
pH: 6 (ref 5.0–8.0)

## 2024-02-05 LAB — PROTIME-INR
INR: 1 (ref 0.8–1.2)
Prothrombin Time: 13.2 s (ref 11.4–15.2)

## 2024-02-05 LAB — ETHANOL: Alcohol, Ethyl (B): <10 mg/dL (ref <10)

## 2024-02-05 LAB — GLUCOSE, CAPILLARY
Glucose-Capillary: 133 mg/dL — ABNORMAL HIGH (ref 70–99)
Glucose-Capillary: 183 mg/dL — ABNORMAL HIGH (ref 70–99)

## 2024-02-05 LAB — APTT: aPTT: 23 s — ABNORMAL LOW (ref 24–36)

## 2024-02-05 MED ORDER — AMLODIPINE BESYLATE 5 MG PO TABS
10.0000 mg | ORAL_TABLET | Freq: Every day | ORAL | Status: DC
  Administered 2024-02-05: 10 mg via ORAL
  Filled 2024-02-05: qty 2

## 2024-02-05 MED ORDER — ACETAMINOPHEN 325 MG PO TABS: 650.0000 mg | ORAL_TABLET | ORAL | Status: DC | PRN

## 2024-02-05 MED ORDER — ACETAMINOPHEN 160 MG/5ML PO SOLN: 650.0000 mg | ORAL | Status: DC | PRN

## 2024-02-05 MED ORDER — CARVEDILOL 3.125 MG PO TABS
6.2500 mg | ORAL_TABLET | Freq: Two times a day (BID) | ORAL | Status: DC
  Administered 2024-02-05 – 2024-02-06 (×3): 6.25 mg via ORAL
  Filled 2024-02-05 (×3): qty 2

## 2024-02-05 MED ORDER — STROKE: EARLY STAGES OF RECOVERY BOOK
Freq: Once | Status: AC
  Filled 2024-02-05: qty 1

## 2024-02-05 MED ORDER — HEPARIN SODIUM (PORCINE) 5000 UNIT/ML IJ SOLN
5000.0000 [IU] | Freq: Three times a day (TID) | INTRAMUSCULAR | Status: DC
  Administered 2024-02-05 – 2024-02-06 (×3): 5000 [IU] via SUBCUTANEOUS
  Filled 2024-02-05 (×3): qty 1

## 2024-02-05 MED ORDER — IOHEXOL 350 MG/ML SOLN
75.0000 mL | Freq: Once | INTRAVENOUS | Status: AC | PRN
  Administered 2024-02-05: 75 mL via INTRAVENOUS

## 2024-02-05 MED ORDER — ATORVASTATIN CALCIUM 40 MG PO TABS
80.0000 mg | ORAL_TABLET | Freq: Every day | ORAL | Status: DC
  Administered 2024-02-05: 80 mg via ORAL
  Filled 2024-02-05: qty 2

## 2024-02-05 MED ORDER — EZETIMIBE 10 MG PO TABS
10.0000 mg | ORAL_TABLET | Freq: Every day | ORAL | Status: DC
  Administered 2024-02-05 – 2024-02-06 (×2): 10 mg via ORAL
  Filled 2024-02-05 (×2): qty 1

## 2024-02-05 MED ORDER — CLOPIDOGREL BISULFATE 75 MG PO TABS
75.0000 mg | ORAL_TABLET | Freq: Every day | ORAL | Status: DC
  Administered 2024-02-05 – 2024-02-06 (×2): 75 mg via ORAL
  Filled 2024-02-05 (×2): qty 1

## 2024-02-05 MED ORDER — INSULIN GLARGINE-YFGN 100 UNIT/ML ~~LOC~~ SOLN
10.0000 [IU] | Freq: Every day | SUBCUTANEOUS | Status: DC
  Administered 2024-02-05: 10 [IU] via SUBCUTANEOUS
  Filled 2024-02-05 (×2): qty 0.1

## 2024-02-05 MED ORDER — SODIUM CHLORIDE 0.9 % IV BOLUS
1000.0000 mL | Freq: Once | INTRAVENOUS | Status: AC
  Administered 2024-02-05: 1000 mL via INTRAVENOUS

## 2024-02-05 MED ORDER — INSULIN ASPART 100 UNIT/ML IJ SOLN
0.0000 [IU] | INTRAMUSCULAR | Status: DC
  Administered 2024-02-05: 1 [IU] via SUBCUTANEOUS
  Administered 2024-02-05: 2 [IU] via SUBCUTANEOUS

## 2024-02-05 MED ORDER — LEVETIRACETAM 500 MG PO TABS
500.0000 mg | ORAL_TABLET | Freq: Two times a day (BID) | ORAL | Status: DC
  Administered 2024-02-05 – 2024-02-06 (×3): 500 mg via ORAL
  Filled 2024-02-05 (×3): qty 1

## 2024-02-05 MED ORDER — ACETAMINOPHEN 650 MG RE SUPP: 650.0000 mg | RECTAL | Status: DC | PRN

## 2024-02-05 MED ORDER — ASPIRIN 81 MG PO TBEC
81.0000 mg | DELAYED_RELEASE_TABLET | Freq: Every day | ORAL | Status: DC
Start: 2024-02-05 — End: 2024-02-06
  Administered 2024-02-05 – 2024-02-06 (×2): 81 mg via ORAL
  Filled 2024-02-05 (×2): qty 1

## 2024-02-05 MED ORDER — BACLOFEN 10 MG PO TABS
15.0000 mg | ORAL_TABLET | Freq: Three times a day (TID) | ORAL | Status: DC
  Administered 2024-02-05 – 2024-02-06 (×4): 15 mg via ORAL
  Filled 2024-02-05 (×4): qty 2

## 2024-02-05 MED ORDER — SODIUM CHLORIDE 0.9 % IV SOLN: INTRAVENOUS | Status: AC

## 2024-02-05 NOTE — ED Notes (Signed)
 Called for apple sauce again to give meds, per sister pt can only take meds with apple sauce.

## 2024-02-05 NOTE — Procedures (Signed)
 Patient Name: Zachary Winters  MRN: [id number]  Epilepsy Attending: Arleene Lack  Referring Physician/Provider: Cornelius Dill, DO  Date: 02/05/2024 Duration: 22.45 mins  Patient history:  43 year old male with prior left MCA stroke who presented with seizure-like activity. EEG to evaluate for seizure  Level of alertness: Awake, drowsy  AEDs during EEG study: LEV  Technical aspects: This EEG study was done with scalp electrodes positioned according to the 10-20 International system of electrode placement. Electrical activity was reviewed with band pass filter of 1-70Hz , sensitivity of 7 uV/mm, display speed of 2mm/sec with a 60Hz  notched filter applied as appropriate. EEG data were recorded continuously and digitally stored.  Video monitoring was available and reviewed as appropriate.  Description: The posterior dominant rhythm consists of 8 Hz activity of moderate voltage (25-35 uV) seen predominantly in posterior head regions, symmetric and reactive to eye opening and eye closing. Drowsiness was characterized by attenuation of the posterior background rhythm. Sleep was characterized by vertex waves, sleep spindles (12 to 14 Hz), maximal frontocentral region. EEG showed continuous 3 to 6 Hz theta-delta slowing in left fronto-temporal region. Hyperventilation and photic stimulation were not performed.     ABNORMALITY - Continuous slow, left fronto-temporal region  IMPRESSION: This study is suggestive of cortical dysfunction arising from  left fronto-temporal region likely secondary to underlying encephalomalacia. No seizures or epileptiform discharges were seen throughout the recording.  Oni Dietzman O Markella Dao

## 2024-02-05 NOTE — ED Notes (Signed)
 Pt eating meal tray at this time. Pt changed and cleaned with new linen

## 2024-02-05 NOTE — ED Triage Notes (Signed)
 Pt bib [facility] from home, per EMS family found pt shaking, altered and having slurred speech around 0310. LKW around midnight. Right sided weakness from previous stroke in June 2024.

## 2024-02-05 NOTE — ED Notes (Signed)
Teleneuro in with pt now

## 2024-02-05 NOTE — ED Notes (Signed)
 Received report from Lucie Ruts RN, pt in MRI

## 2024-02-05 NOTE — ED Notes (Signed)
 Patient transported to MRI

## 2024-02-05 NOTE — Plan of Care (Signed)
  Problem: Acute Rehab OT Goals (only OT should resolve) Goal: Pt. Will Perform Upper Body Bathing Flowsheets (Taken 02/05/2024 1533) Pt Will Perform Upper Body Bathing: with min assist Goal: Pt. Will Perform Upper Body Dressing Flowsheets (Taken 02/05/2024 1533) Pt Will Perform Upper Body Dressing: with min assist Goal: Pt. Will Transfer To Toilet Flowsheets (Taken 02/05/2024 1533) Pt Will Transfer to Toilet:  with mod assist  with min assist  anterior/posterior transfer Goal: Pt/Caregiver Will Perform Home Exercise Program Flowsheets (Taken 02/05/2024 1533) Pt/caregiver will Perform Home Exercise Program:  Increased ROM  Increased strength  Right Upper extremity  Left upper extremity  With minimal assist  Tauriel Scronce OT, MOT

## 2024-02-05 NOTE — ED Notes (Signed)
 ED Provider at bedside.

## 2024-02-05 NOTE — Evaluation (Signed)
 Physical Therapy Evaluation Patient Details Name: Zachary Winters MRN: [id number] DOB: 09-Mar-1981 Today's Date: 02/05/2024  History of Present Illness  Zachary Winters is a 43 y.o. male with medical history significant for CVA with right-sided hemiplegia-wheelchair-bound, hypertension, dyslipidemia, and type 2 diabetes who presented to the ED with noted seizure episode earlier this morning that was witnessed by family member that lasted approximately 1-2 minutes and was associated with some confusion afterwards.  It was also concerning that he had some worsening right-sided weakness as well as aphasia from his prior baseline.  No other concerns were otherwise noted.  He denies any nausea or vomiting or diarrhea and has been eating and drinking as per his usual baseline.  CT shows no acute CVA  Clinical Impression  Pt cooperative with pt .  PT is slow to answer questions but appears to answer appropriate.  States family assists pt to wheelchair but it is uncertain how much assist pt has been able to give family in the past.  PT will benefit to attempt to improve transfers to decrease the burden on the family.         If plan is discharge home, recommend the following: A lot of help with walking and/or transfers;A lot of help with bathing/dressing/bathroom;Assistance with cooking/housework;Assist for transportation;Help with stairs or ramp for entrance   Can travel by private vehicle    yes    Equipment Recommendations None recommended by PT  Recommendations for Other Services    none   Functional Status Assessment Patient has had a recent decline in their functional status and demonstrates the ability to make significant improvements in function in a reasonable and predictable amount of time.     Precautions / Restrictions Precautions Precautions: Fall Restrictions Weight Bearing Restrictions Per Provider Order: No      Mobility  Bed Mobility Overal bed mobility: Needs  Assistance Bed Mobility: Supine to Sit     Supine to sit: Max assist          Transfers Overall transfer level: Needs assistance                 General transfer comment: max assist to come sit to stand without pt getting bottom off of bed.  Pt was able to assist scooting to head of bed    Ambulation/Gait   Gait Distance (Feet):  (unable)                  Pertinent Vitals/Pain Pain Assessment Pain Assessment: Faces Faces Pain Scale: No hurt    Home Living Family/patient expects to be discharged to:: Private residence Living Arrangements: Other (Comment);Other relatives Available Help at Discharge: Family Type of Home: House Home Access: Level entry;Ramped entrance       Home Layout: One level Home Equipment: Wheelchair - manual      Prior Function Prior Level of Function : Needs assist       Physical Assist : ADLs (physical);Mobility (physical) Mobility (physical): Bed mobility;Transfers ADLs (physical): Feeding;Grooming;Bathing;Dressing;Toileting   ADLs Comments: Assist in all     Extremity/Trunk Assessment        Lower Extremity Assessment Lower Extremity Assessment: RLE deficits/detail;LLE deficits/detail RLE Deficits / Details: unable to use since previous stroke LLE Deficits / Details: weak       Communication   Communication Cueing Techniques: Verbal cues  Cognition Arousal: Alert Behavior During Therapy: WFL for tasks assessed/performed Overall Cognitive Status: Within Functional Limits for tasks assessed  General Comments General comments (skin integrity, edema, etc.): sitting balance static good, dynamic fair    Exercises     Assessment/Plan    PT Assessment Patient needs continued PT services  PT Problem List Decreased strength;Decreased balance;Decreased mobility       PT Treatment Interventions Therapeutic activities;Therapeutic exercise    PT Goals  (Current goals can be found in the Care Plan section)  Acute Rehab PT Goals Patient Stated Goal: to go home PT Goal Formulation: With patient Time For Goal Achievement: 02/09/24 Potential to Achieve Goals: Good    Frequency Min 2X/week     Co-evaluation               AM-PAC PT "6 Clicks" Mobility  Outcome Measure Help needed turning from your back to your side while in a flat bed without using bedrails?: A Lot Help needed moving from lying on your back to sitting on the side of a flat bed without using bedrails?: A Lot Help needed moving to and from a bed to a chair (including a wheelchair)?: Total Help needed standing up from a chair using your arms (e.g., wheelchair or bedside chair)?: Total Help needed to walk in hospital room?: Total Help needed climbing 3-5 steps with a railing? : Total 6 Click Score: 8    End of Session Equipment Utilized During Treatment: Gait belt Activity Tolerance: Patient tolerated treatment well Patient left: in bed;with call bell/phone within reach Nurse Communication: Mobility status PT Visit Diagnosis: Muscle weakness (generalized) (M62.81);Other abnormalities of gait and mobility (R26.89)    Time: 1400-1420 PT Time Calculation (min) (ACUTE ONLY): 20 min   Charges:   PT Evaluation $PT Eval Low Complexity: 1 Low   PT General Charges $$ ACUTE PT VISIT: 1 Visit    Leodis Rainwater, PT CLT [phone]  02/05/2024, 2:46 PM

## 2024-02-05 NOTE — Consult Note (Addendum)
 I connected with  Zachary Winters on 02/05/24 by a video enabled telemedicine application and verified that I am speaking with the correct person using two identifiers.   I discussed the limitations of evaluation and management by telemedicine. The patient expressed understanding and agreed to proceed.  Location of patient: [facility] Location of physician: [facility]  Neurology Consultation Reason for Consult: Zachary Penman, MD  Referring Physician: Dr Zachary Winters  CC: right sided weakness and seizure like activity  History is obtained from: patient, chart review  HPI: Zachary Winters is a 43 y.o. 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  Patient is unable to provide any history.  I was able to call and talk to patient's sister Zachary Winters.  [name] states her daughter woke up around 3 AM this morning and noticed that patient was shaking and making noises.  By the time [name] went to check on him, he looked confused but was not drinking anymore.  They called EMS who noticed that patient appeared to have right-sided facial droop (per sister this had significantly improved since the stroke).  By the time he was seen in the ER, he was awake and able to answer questions.  MRI brain did not show any acute stroke.   ROS: All other systems reviewed and negative except as noted in the HPI.   Past Medical History:  Diagnosis Date   Nausea and vomiting    chronic for years.   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   Social History:  reports that he has quit smoking. His smoking use included cigarettes. He has a 5 pack-year smoking history. He has never used smokeless  tobacco. He reports that he does not currently use alcohol. He reports that he does not currently use drugs after having used the following drugs: Marijuana. Frequency: 2.00 times per week.   (Not in a hospital admission)   Exam: Current vital signs: BP (!) 139/93   Pulse 75   Temp 98.2 F (36.8 C)   Resp 13   Wt 88 kg   SpO2 99%   BMI 24.91 kg/m  Vital signs in last 24 hours: Temp:  [98.2 F (36.8 C)] 98.2 F (36.8 C) (02/10 0757) Pulse Rate:  [70-81] 75 (02/10 1100) Resp:  [11-19] 13 (02/10 1100) BP: (139-175)/(88-101) 139/93 (02/10 1100) SpO2:  [96 %-99 %] 99 % (02/10 1100) Weight:  [88 kg] 88 kg (02/10 0409)   Physical Exam  Constitutional: Appears well-developed and well-nourished.  Psych: Affect appropriate to situation Neuro: Awake, alert, oriented to time place person, follows simple commands, able to recognize 1 out of 5 objects correctly, mild dysarthria, right facial droop, rest of the cranial nerves appear grossly intact, no movement in right upper extremity with flexion contracture, antigravity strength in left upper extremity, antigravity in the left lower extremity, only able to be withdraws in right lower extremity, FTN intact in left upper extremity  I have reviewed labs in epic and the results pertinent to this consultation are: CBC:  Recent Labs  Lab 02/05/24 0410  WBC 7.6  NEUTROABS 5.1  HGB 10.7*  HCT  32.5*  MCV 89.8  PLT 228    Basic Metabolic Panel:  Lab Results  Component Value Date   NA 140 02/05/2024   K 4.2 02/05/2024   CO2 24 02/05/2024   GLUCOSE 101 (H) 02/05/2024   BUN 37 (H) 02/05/2024   CREATININE 2.05 (H) 02/05/2024   CALCIUM  8.9 02/05/2024   GFRNONAA 41 (L) 02/05/2024   GFRAA 94 10/31/2019   Lipid Panel:  Lab Results  Component Value Date   LDLCALC 114 (H) 11/21/2023   HgbA1c:  Lab Results  Component Value Date   HGBA1C 5.6 11/21/2023   Urine Drug Screen:     Component Value Date/Time   LABOPIA NONE DETECTED  02/05/2024 0500   COCAINSCRNUR NONE DETECTED 02/05/2024 0500   LABBENZ NONE DETECTED 02/05/2024 0500   AMPHETMU NONE DETECTED 02/05/2024 0500   THCU NONE DETECTED 02/05/2024 0500   LABBARB NONE DETECTED 02/05/2024 0500    Alcohol Level     Component Value Date/Time   ETH <10 02/05/2024 0410     I have reviewed the images obtained:  CT Head without contrast 02/05/2024:No acute intracranial abnormality. Aspects is 10. Encephalomalacia involving the left frontal lobe, consistent with prior left MCA distribution infarct.  CT angio Head and Neck with contrast 02/05/2024: Negative for emergent large vessel occlusion, but positive for severely age advanced atherosclerosis in both vertebral arteries, both carotid siphons and termini. Critical stenosis, Near Occlusion of the Left Vertebral Artery V4 segment similar to but mildly progressed since 06/21/2023. New Moderate irregularity and stenosis of the Right PCA P1/P2 junction. The increased irregularity here could affect Right thalamostriate vessels. Tandem Moderate irregularity and stenosis of the proximal Right Vertebral Artery, V1 and V2 segments. The Right vertebral functionally supplies the Basilar. Left ICA siphon distal cavernous and supraclinoid complex plaque with up to Severe Stenosis. Diminutive although improved Left terminus since last year. Right ICA siphon bulky supraclinoid calcified plaque with up to Severe Stenosis which may have progressed since last year. Irregular and stenotic left MCA but improved from the CTA last year, chronic left MCA territory infarct. Other mild intracranial atherosclerosis. No significant carotid stenosis in the neck.  MRI Brain wo contrast 02/05/2024:  No acute intracranial process. Chronic left MCA territory infarct.    ASSESSMENT/PLAN: 43 year old male with prior left MCA stroke who presented with seizure-like activity.  New onset focal epilepsy -Likely secondary underlying stroke.  No clear  provoking factors.  Recommendations: -EEG ordered and pending -Recommend Keppra  500 mg twice daily.  -Patient has not seen a neurointerventionalist for his severe intracranial stenosis.  He does have a follow-up appointment with Dr. Janett Medin at [facility] on 4/28.  Discussed with Dr. Janett Medin.  Recommend aspirin  81 mg daily and Plavix  75 mg daily for 3 months due to worsening vertebral stenosis.  If he has recurrence of symptoms, then can be referred to neuro interventionalists at that time -Discussed plan with patient sister on phone Discussed plan with Dr. Mason Sole via secure chat  Thank you for allowing us  to participate in the care of this patient. If you have any further questions, please contact  me or neurohospitalist.   Roxy Cordial Epilepsy [facility] neuro[facility]ist

## 2024-02-05 NOTE — ED Notes (Signed)
 Pt's sister at bedside.

## 2024-02-05 NOTE — Progress Notes (Signed)
   02/05/24 1343  TOC Brief Assessment  Insurance and Status Reviewed  Patient has primary care physician Yes  Home environment has been reviewed home  Prior level of function: wheel chair bound  Prior/Current Home Services No current home services  Social Drivers of Health Review SDOH reviewed no interventions necessary  Readmission risk has been reviewed Yes  Transition of care needs no transition of care needs at this time   IN ED, continuing CVA work up, Wheelchair bound at baseline.   Transition of Care Department [facility]) has reviewed patient and no TOC needs have been identified at this time. We will continue to monitor patient advancement through interdisciplinary progression rounds. If new patient transition needs arise, please place a TOC consult.

## 2024-02-05 NOTE — Evaluation (Addendum)
 Occupational Therapy Evaluation Patient Details Name: Zachary Winters MRN: [id number] DOB: 06/09/1981 Today's Date: 02/05/2024   History of Present Illness Zachary Winters is a 43 y.o. male with medical history significant for CVA with right-sided hemiplegia-wheelchair-bound, hypertension, dyslipidemia, and type 2 diabetes who presented to the ED with noted seizure episode earlier this morning that was witnessed by family member that lasted approximately 1-2 minutes and was associated with some confusion afterwards.  It was also concerning that he had some worsening right-sided weakness as well as aphasia from his prior baseline.  No other concerns were otherwise noted.  He denies any nausea or vomiting or diarrhea and has been eating and drinking as per his usual baseline. (per DO)   Clinical Impression   Pt agreeable to OT evaluation. Pt reports family assist for ADL's at baseline. Pt reports using w/c for mobility. Pt able to sit at EOB with mod A using railing for assist. R UE limited at baseline. Able to use L UE functionally during evaluation. Pt reports that family can provide the needed level of support at home. Pt struggled to scoot to head of bed while seated but did scoot while in supine without assist using the raling. Recommend home health OT given necessary family support and home health to provide education at home for P/ROM to R UE. Pt reported he does not get daily P/ROM at this time.  Pt left in bed with call bell within reach and bed alarm set. Pt will benefit from continued OT in the hospital and recommended venue below to increase strength, balance, and endurance for safe ADL's.          If plan is discharge home, recommend the following: Two people to help with walking and/or transfers;A lot of help with bathing/dressing/bathroom;Assistance with cooking/housework;Assistance with feeding;Assist for transportation;Help with stairs or ramp for entrance    Functional Status  Assessment  Patient has had a recent decline in their functional status and demonstrates the ability to make significant improvements in function in a reasonable and predictable amount of time.  Equipment Recommendations  None recommended by OT           Precautions / Restrictions Precautions Precautions: Fall Restrictions Weight Bearing Restrictions Per Provider Order: No      Mobility Bed Mobility Overal bed mobility: Needs Assistance Bed Mobility: Supine to Sit     Supine to sit: HOB elevated, Mod assist     General bed mobility comments: HOB highly elevated; pt able to use rail to assist with pull to sit.    Transfers                   General transfer comment: Pt struggled to scoot to head of bed today. No sit to stand attempted.      Balance Overall balance assessment: Needs assistance Sitting-balance support: Feet supported, Single extremity supported Sitting balance-Leahy Scale: Fair Sitting balance - Comments: seated at EOB                                   ADL either performed or assessed with clinical judgement   ADL Overall ADL's : Needs assistance/impaired Eating/Feeding: Minimal assistance;Set up;Sitting   Grooming: Minimal assistance;Moderate assistance;Sitting   Upper Body Bathing: Sitting;Moderate assistance;Maximal assistance   Lower Body Bathing: Maximal assistance;Bed level;Sitting/lateral leans   Upper Body Dressing : Moderate assistance;Maximal assistance;Sitting;Bed level   Lower Body Dressing: Maximal assistance;Bed  level;Sitting/lateral leans   Toilet Transfer: Maximal assistance;Anterior/posterior;Transfer Regulatory affairs officer Details (indicate cue type and reason): not attempted Toileting- Clothing Manipulation and Hygiene: Maximal assistance;Total assistance       Functional mobility during ADLs:  (Pt reports he does not ambulate at baseline)       Vision Baseline Vision/History:  (R side visual  issues since last stroke. Pt seemed to report R side visual field cut.) Ability to See in Adequate Light: 2 Moderately impaired Patient Visual Report: No change from baseline Vision Assessment?: Yes Additional Comments: Pt seemed to report R side visual field deficit at baseline. Able to track an object well.     Perception Perception: Not tested       Praxis Praxis: Not tested       Pertinent Vitals/Pain Pain Assessment Pain Assessment: Faces Faces Pain Scale: Hurts little more Pain Location: pain with P/ROM to R UE. Pain Descriptors / Indicators: Grimacing Pain Intervention(s): Limited activity within patient's tolerance, Monitored during session, Repositioned     Extremity/Trunk Assessment Upper Extremity Assessment Upper Extremity Assessment: LUE deficits/detail;RUE deficits/detail RUE Deficits / Details: limited to less than half of P/ROM for shoulder flexion with moderate tone; limited P/ROM of elbow and wrist as well with pain noted. No functional use of R UE. RUE Coordination: decreased fine motor;decreased gross motor LUE Deficits / Details: General weakness; able to use L UE functionally.   Lower Extremity Assessment Lower Extremity Assessment: Defer to PT evaluation RLE Deficits / Details: unable to use since previous stroke LLE Deficits / Details: weak       Communication Communication Communication: Difficulty communicating thoughts/reduced clarity of speech Cueing Techniques: Verbal cues   Cognition Arousal: Alert Behavior During Therapy: WFL for tasks assessed/performed Overall Cognitive Status: Within Functional Limits for tasks assessed                                                       Home Living Family/patient expects to be discharged to:: Private residence Living Arrangements: Other (Comment);Other relatives Available Help at Discharge: Family;Available 24 hours/day Type of Home: House Home Access: Level entry;Ramped  entrance     Home Layout: One level     Bathroom Shower/Tub: Chief Strategy Officer: Standard Bathroom Accessibility: Yes How Accessible: Accessible via walker Home Equipment: Wheelchair - manual          Prior Functioning/Environment Prior Level of Function : Needs assist       Physical Assist : Mobility (physical);ADLs (physical) Mobility (physical): Bed mobility;Transfers ADLs (physical): Feeding;Grooming;Bathing;Dressing;Toileting Mobility Comments: Pt reports using w/c for mobility and seemed to suggest that  he scoots to the w/c. ADLs Comments: Assisted for ADL's at baseline.        OT Problem List: Decreased strength;Decreased range of motion;Decreased activity tolerance;Impaired balance (sitting and/or standing);Impaired vision/perception;Decreased coordination;Pain;Impaired tone      OT Treatment/Interventions: Self-care/ADL training;Therapeutic exercise;Therapeutic activities;Patient/family education;Balance training;DME and/or AE instruction;Neuromuscular education;Visual/perceptual remediation/compensation    OT Goals(Current goals can be found in the care plan section) Acute Rehab OT Goals Patient Stated Goal: return home with family to assist OT Goal Formulation: With patient Time For Goal Achievement: 02/19/24 Potential to Achieve Goals: Good  OT Frequency: Min 1X/week                  AM-PAC OT "6 Clicks"  Daily Activity     Outcome Measure Help from another person eating meals?: A Little Help from another person taking care of personal grooming?: A Lot Help from another person toileting, which includes using toliet, bedpan, or urinal?: A Lot Help from another person bathing (including washing, rinsing, drying)?: A Lot Help from another person to put on and taking off regular upper body clothing?: A Lot Help from another person to put on and taking off regular lower body clothing?: A Lot 6 Click Score: 13   End of Session     Activity Tolerance: Patient tolerated treatment well Patient left: in bed;with call bell/phone within reach;with bed alarm set  OT Visit Diagnosis: Unsteadiness on feet (R26.81);Muscle weakness (generalized) (M62.81);Other symptoms and signs involving the nervous system (R29.898);Hemiplegia and hemiparesis Hemiplegia - Right/Left: Right Hemiplegia - caused by:  (chronic L MCA)                Time: 6010-9323 OT Time Calculation (min): 13 min Charges:  OT General Charges $OT Visit: 1 Visit OT Evaluation $OT Eval Low Complexity: 1 Low  Zachary Winters OT, MOT   Thurnell Floss 02/05/2024, 3:27 PM

## 2024-02-05 NOTE — ED Notes (Signed)
 Pt returned from MRI

## 2024-02-05 NOTE — H&P (Signed)
 History and Physical    ZAELYN YOUNGS ION:[id number] DOB: January 25, 1981 DOA: 02/05/2024  PCP: Zarwolo, Gloria, FNP   Patient coming from: Home  Chief Complaint: Questionable seizure and worsening right-sided weakness.  HPI: Zachary Winters is a 43 y.o. male with medical history significant for CVA with right-sided hemiplegia-wheelchair-bound, hypertension, dyslipidemia, and type 2 diabetes who presented to the ED with noted seizure episode earlier this morning that was witnessed by family member that lasted approximately 1-2 minutes and was associated with some confusion afterwards.  It was also concerning that he had some worsening right-sided weakness as well as aphasia from his prior baseline.  No other concerns were otherwise noted.  He denies any nausea or vomiting or diarrhea and has been eating and drinking as per his usual baseline.   ED Course: Vital signs stable and patient afebrile.  Hemoglobin 10.7 and creatinine 2.05.  CT head and angiogram negative for any acute findings.  MRI negative as well.  Started on IV fluid bolus.  Neurology contacted with recommendations to admit for further evaluation.  Review of Systems: Reviewed as noted above, otherwise negative.  Past Medical History:  Diagnosis Date   Nausea and vomiting    chronic for years.   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 His smoking use included cigarettes. He has a 5 pack-year smoking history. He has never used smokeless tobacco.  He reports that he does not currently use alcohol. He reports that he does not currently use drugs after having used the following drugs: Marijuana. Frequency: 2.00 times per week.  Allergies  Allergen Reactions   Chocolate     vomiting   Metformin  Nausea And Vomiting   Penicillins Swelling    No family history on file.  Prior to Admission medications   Medication Sig Start Date End Date Taking? Authorizing Provider  amLODipine  (NORVASC ) 10 MG tablet Take 1 tablet (10 mg total) by mouth daily. 08/22/23  Yes Zarwolo, Gloria, FNP  aspirin  EC 81 MG tablet Take 81 mg by mouth daily. Swallow whole.   Yes [provider]  atorvastatin  (LIPITOR ) 80 MG tablet Take 1 tablet (80 mg total) by mouth daily after supper. 08/22/23  Yes Zarwolo, Gloria, FNP  baclofen  (LIORESAL ) 10 MG tablet TAKE 1 AND 1/2 TABLETS(15 MG) BY MOUTH THREE TIMES DAILY 01/15/24  Yes Kirsteins, Cecilia Coe, MD  carvedilol  (COREG ) 6.25 MG tablet TAKE 1 TABLET(6.25 MG) BY MOUTH TWICE DAILY WITH A MEAL 11/20/23  Yes Zarwolo, Gloria, FNP  clopidogrel  (PLAVIX ) 75 MG tablet Take 1 tablet (75 mg total) by mouth daily. 08/22/23  Yes Zarwolo, Gloria, FNP  ezetimibe  (ZETIA ) 10 MG tablet Take 1 tablet (10 mg total) by mouth daily. 12/01/23  Yes Zarwolo, Gloria, FNP  glimepiride  (AMARYL ) 2 MG tablet Take 1 tablet (2 mg total) by mouth daily with breakfast. 08/22/23  Yes Zarwolo, Gloria, FNP  insulin  glargine (LANTUS ) 100 UNIT/ML Solostar Pen Inject 18 Units into the skin at bedtime. 10/27/23  Yes Zarwolo, Gloria, FNP  blood glucose meter kit and supplies Dispense based on patient and insurance preference. Check blood sugar four times daily as directed. (FOR ICD-10 E10.9, E11.9). 10/09/23   Zarwolo, Gloria, FNP  Insulin  Pen Needle (PEN NEEDLES) 32G X 4 MM MISC pen needle BD Nano Ultra Fine 32G x 4mm DX: e11.40 10/27/23   Junnie Olives, FNP    Physical Exam: Vitals:   02/05/24 0645 02/05/24 0757 02/05/24 0800 02/05/24 0830  BP: (!) 156/91   (!) 146/88 (!) 167/97  Pulse: 70  73 77  Resp: 11     Temp:  98.2 F (36.8 C)    SpO2: 99%  97% 97%  Weight:        Constitutional: NAD, calm, comfortable, aphasic and right side weak Vitals:   02/05/24 0645 02/05/24 0757 02/05/24 0800 02/05/24 0830  BP: (!) 156/91  (!) 146/88 (!) 167/97  Pulse: 70  73 77  Resp: 11     Temp:  98.2 F (36.8 C)    SpO2: 99%  97% 97%  Weight:       Eyes: lids and conjunctivae normal Neck: normal, supple Respiratory: clear to auscultation bilaterally. Normal respiratory effort. No accessory muscle use.  Cardiovascular: Regular rate and rhythm, no murmurs. Abdomen: no tenderness, no distention. Bowel sounds positive.  Musculoskeletal:  No edema. Skin: no rashes, lesions, ulcers.  Psychiatric: Flat affect  Labs on Admission: I have personally reviewed following labs and imaging studies  CBC: Recent Labs  Lab 02/05/24 0410  WBC 7.6  NEUTROABS 5.1  HGB 10.7*  HCT 32.5*  MCV 89.8  PLT 228   Basic Metabolic Panel: Recent Labs  Lab 02/05/24 0410  NA 140  K 4.2  CL 109  CO2 24  GLUCOSE 101*  BUN 37*  CREATININE 2.05*  CALCIUM  8.9   GFR: Estimated Creatinine Clearance: 55.7 mL/min (A) (by C-G formula based on SCr of 2.05 mg/dL (H)). Liver Function Tests: Recent Labs  Lab 02/05/24 0410  AST 40  ALT 74*  ALKPHOS 99  BILITOT 0.3  PROT 7.4  ALBUMIN 3.6   No results for input(s): "LIPASE", "AMYLASE" in the last 168 hours. No results for input(s): "AMMONIA" in the last 168 hours. Coagulation Profile: Recent Labs  Lab 02/05/24 0410  INR 1.0   Cardiac Enzymes: No results for input(s): "CKTOTAL", "CKMB", "CKMBINDEX", "TROPONINI" in the last 168 hours. BNP (last 3 results) No results for input(s): "PROBNP" in the last 8760 hours. HbA1C: No results for input(s): "HGBA1C" in the last 72 hours. CBG: Recent Labs  Lab 02/05/24 0345  GLUCAP 105*   Lipid Profile: No results for input(s): "CHOL", "HDL", "LDLCALC", "TRIG",  "CHOLHDL", "LDLDIRECT" in the last 72 hours. Thyroid  Function Tests: No results for input(s): "TSH", "T4TOTAL", "FREET4", "T3FREE", "THYROIDAB" in the last 72 hours. Anemia Panel: No results for input(s): "VITAMINB12", "FOLATE", "FERRITIN", "TIBC", "IRON", "RETICCTPCT" in the last 72 hours. Urine analysis:    Component Value Date/Time   COLORURINE STRAW (A) 02/05/2024 0500   APPEARANCEUR CLEAR 02/05/2024 0500   LABSPEC 1.021 02/05/2024 0500   PHURINE 6.0 02/05/2024 0500   GLUCOSEU NEGATIVE 02/05/2024 0500   HGBUR SMALL (A) 02/05/2024 0500   BILIRUBINUR NEGATIVE 02/05/2024 0500   KETONESUR NEGATIVE 02/05/2024 0500   PROTEINUR 100 (A) 02/05/2024 0500   NITRITE NEGATIVE 02/05/2024 0500   LEUKOCYTESUR NEGATIVE 02/05/2024 0500    Radiological Exams on Admission: MR BRAIN WO CONTRAST Result Date: 02/05/2024 CLINICAL DATA:  Neuro deficit, acute, stroke suspected EXAM:  MRI HEAD WITHOUT CONTRAST TECHNIQUE: Multiplanar, multiecho pulse sequences of the brain and surrounding structures were obtained without intravenous contrast. COMPARISON:  Same day head CT, brain MRI 06/19/2023 FINDINGS: Brain: Negative for an acute infarct no acute hemorrhage. No hydrocephalus. No extra-axial fluid collection. No mass effect. No mass lesion. There is a chronic left MCA territory infarct involving the left opercular and insular regions, as well as the left basal ganglia. There is a region of susceptibility artifact along the corona radiata on the left (series 15, image 32), favored to represent chronic hemosiderin deposition. No mass effect. No mass lesion. There is a background of mild overall chronic microvascular ischemic change Vascular: Normal flow voids. Skull and upper cervical spine: Normal marrow signal. Sinuses/Orbits: Moderate left-sided mastoid effusion. Paranasal sinuses are clear. Orbits are unremarkable. Other: None. IMPRESSION: 1. No acute intracranial process. 2. Chronic left MCA territory infarct.  Electronically Signed   By: Clora Dane M.D.   On: 02/05/2024 07:55   CT ANGIO HEAD NECK W WO CM Result Date: 02/05/2024 CLINICAL DATA:  43 year old male code stroke presentation. History of previous left MCA infarct. EXAM: CT ANGIOGRAPHY HEAD AND NECK WITH AND WITHOUT CONTRAST TECHNIQUE: Multidetector CT imaging of the head and neck was performed using the standard protocol during bolus administration of intravenous contrast. Multiplanar CT image reconstructions and MIPs were obtained to evaluate the vascular anatomy. Carotid stenosis measurements (when applicable) are obtained utilizing NASCET criteria, using the distal internal carotid diameter as the denominator. RADIATION DOSE REDUCTION: This exam was performed according to the departmental dose-optimization program which includes automated exposure control, adjustment of the mA and/or kV according to patient size and/or use of iterative reconstruction technique. CONTRAST:  75mL OMNIPAQUE  IOHEXOL  350 MG/ML SOLN COMPARISON:  Plain head CT 0338 hours today.  Prior CTA 06/21/2023. FINDINGS: CTA NECK Skeleton: Carious wisdom tooth dentition. Reversal of the normal cervical lordosis. Occasional cervical spine disc and endplate degeneration. No acute osseous abnormality identified. Upper chest: Mild atelectasis but improved right upper lung ventilation compared to last year. Atelectatic changes to the trachea. Other neck: Noncontrast neck soft tissue spaces are within normal limits. Aortic arch: Bovine arch configuration.  No arch atherosclerosis. Right carotid system: Patent, mildly tortuous with no plaque or stenosis, stable. Left carotid system: Patent and mildly tortuous with no plaque or stenosis, stable. Vertebral arteries: Proximal right subclavian artery and right vertebral artery origin are normal. Right V1 soft and atherosclerotic plaque with moderate stenosis on series 6, image 532 is chronic, not significantly changed. Right vertebral artery remains  patent with additional stenosis and irregularity at the V1/V2 junctions which is moderate and appears progressed. Right vertebral artery caliber distal to that is stable, the vessel remains patent to the skull base. Proximal left subclavian artery is normal. Diminutive left vertebral artery with atherosclerotic plaque at its origin and in the V1 segment. Extensive V2 segment plaque and stenosis. But the vessel remains patent in the skull base and has a better caliber in the distal V2 segment similar to last year. See intracranial findings below. CTA HEAD Posterior circulation: Distal left vertebral artery is critically stenosed in the proximal to mid V4 segment on series 6, image 285, with a very similar appearance on the CTA last year. The vessel appears almost functionally occluded now. There is reconstitution of the distal V4 segment probably retrograde. Contralateral distal right vertebral artery remains patent to the vertebrobasilar junction without stenosis. Basilar artery is patent with mild irregularity and no stenosis. SCA and PCA  origins are patent. Posterior communicating arteries are diminutive or absent. Left PCA branches are within normal limits. There is mild to moderate irregularity and stenosis now at the right P1/P2 junction (series 10, image 23) which is new or progressed. Distal right PCA enhancement persists. Anterior circulation: Both ICA siphons remain patent. However, on the left there is distal cavernous tortuosity and complex atherosclerosis resulting in up to severe stenosis in the anterior genu and the supraclinoid segment. See series 9, image 122 and series 6, image 253. This is chronic and barely similar to last year. Contralateral right ICA siphon is larger, patent through the cavernous segment without stenosis, but there is bulky right supraclinoid ICA calcified plaque and up to severe stenosis on series 6, image 250. This is chronic but may have progressed. Patent carotid termini.  Chronically irregular left ICA terminus, left MCA and ACA origins, although mildly improved compared to last year. Right MCA and ACA origins are patent. Right A1 appears dominant. Bilateral ACA branches are within normal limits. Right MCA M1 segment is patent with mild irregularity and stenosis on series 11, image 22 which appears increased. Right MCA branches appear stable. Left MCA M1 segment is moderately irregular and stenotic (series 10, image 23) but improved. Left MCA bifurcation is patent. Left MCA branches are improved compared to last year. Venous sinuses: Patent. Anatomic variants: Affectively dominant right vertebral artery, right ACA A1. Review of the MIP images confirms the above findings IMPRESSION: 1. Negative for emergent large vessel occlusion, but positive for severely age advanced atherosclerosis in both vertebral arteries, both carotid siphons and termini: - Critical stenosis, Near Occlusion of the Left Vertebral Artery V4 segment similar to but mildly progressed since 06/21/2023. - New Moderate irregularity and stenosis of the Right PCA P1/P2 junction. The increased irregularity here could affect Right thalamostriate vessels. - Tandem Moderate irregularity and stenosis of the proximal Right Vertebral Artery, V1 and V2 segments. The Right vertebral functionally supplies the Basilar. - Left ICA siphon distal cavernous and supraclinoid complex plaque with up to Severe Stenosis. Diminutive although improved Left terminus since last year. - Right ICA siphon bulky supraclinoid calcified plaque with up to Severe Stenosis which may have progressed since last year. 2. Irregular and stenotic left MCA but improved from the CTA last year, chronic left MCA territory infarct. 3. Other mild intracranial atherosclerosis. No significant carotid stenosis in the neck. Electronically Signed   By: Marlise Simpers M.D.   On: 02/05/2024 04:42   CT HEAD CODE STROKE WO CONTRAST Result Date: 02/05/2024 CLINICAL DATA:  Code  stroke. Initial evaluation for acute neuro deficit, stroke suspected. EXAM: CT HEAD WITHOUT CONTRAST TECHNIQUE: Contiguous axial images were obtained from the base of the skull through the vertex without intravenous contrast. RADIATION DOSE REDUCTION: This exam was performed according to the departmental dose-optimization program which includes automated exposure control, adjustment of the mA and/or kV according to patient size and/or use of iterative reconstruction technique. COMPARISON:  Prior study from 06/19/2023 FINDINGS: Brain: Cerebral volume within normal limits. Encephalomalacia involving the left frontal lobe, consistent with prior left MCA distribution infarct. No acute intracranial hemorrhage. No acute large vessel territory infarct. No mass lesion or midline shift. No hydrocephalus or extra-axial fluid collection. Vascular: No abnormal hyperdense vessel. Scattered vascular calcifications noted within the carotid siphons. Skull: Scalp soft tissues demonstrate no acute finding. Calvarium intact. Sinuses/Orbits: Left gaze preference noted. Paranasal sinuses are largely clear. Moderate left mastoid effusion noted. Other: None. ASPECTS [facility] Stroke Program Early CT  Score) - Ganglionic level infarction (caudate, lentiform nuclei, internal capsule, insula, M1-M3 cortex): 7 - Supraganglionic infarction (M4-M6 cortex): 3 Total score (0-10 with 10 being normal): 10 IMPRESSION: 1. No acute intracranial abnormality. Aspects is 10. 2. Encephalomalacia involving the left frontal lobe, consistent with prior left MCA distribution infarct. 3. Moderate left mastoid effusion. Results were called by telephone at the time of interpretation on 02/05/2024 at 3:51 am to provider [facility] , who verbally acknowledged these results. Electronically Signed   By: Virgia Griffins M.D.   On: 02/05/2024 03:51    EKG: Independently reviewed. SR 81bpm.  Assessment/Plan Principal Problem:   CVA (cerebral vascular  accident) (HCC) Active Problems:   Hypertension associated with diabetes (HCC)   Essential hypertension   Hyperlipidemia associated with type 2 diabetes mellitus (HCC)   Dehydration   Aphasia   Diabetic neuropathy (HCC)   Stroke (HCC)    Possible seizure with worsening right-sided hemiplegia/aphasia -Could be related to stroke recrudescence in the setting of AKI -Possible seizure activity, evaluate with EEG -Imaging studies with no acute findings -Continue statin/Plavix  -Appreciate neurology evaluation  Mild AKI -Creatinine 2.05 with baseline 1.5-1.6 -Administer IV fluid as he appears somewhat dehydrated -Continue to monitor -Avoid nephrotoxic agents  Diabetes type 2 -Carb modified diet and SSI -Hold home oral agents  Hypertension -Continue home amlodipine   Dyslipidemia -Continue statin  DVT prophylaxis: Heparin  Code Status: Full Family Communication: Sister at bedside 2/10 Disposition Plan: Admit for CVA/seizure evaluation Consults called: Neurology Admission status: Obs, Tele  Severity of Illness: The appropriate patient status for this patient is OBSERVATION. Observation status is judged to be reasonable and necessary in order to provide the required intensity of service to ensure the patient's safety. The patient's presenting symptoms, physical exam findings, and initial radiographic and laboratory data in the context of their medical condition is felt to place them at decreased risk for further clinical deterioration. Furthermore, it is anticipated that the patient will be medically stable for discharge from the hospital within 2 midnights of admission.    Dquan Cortopassi D Mason Sole DO [facility]  If 7PM-7AM, please contact night-coverage [email or web address]  02/05/2024, 9:53 AM

## 2024-02-05 NOTE — ED Provider Notes (Signed)
 [facility] Emergency Department Provider Note MRN:  [id number]  Arrival date & time: 02/05/24     Chief Complaint   Code Stroke   History of Present Illness   Zachary Winters is a 43 y.o. year-old male with a history of stroke, diabetes presenting to the ED with chief complaint of stroke.  Code stroke initiated by EMS.  Report of new right-sided deficits.  Has a history of stroke with right arm deficits but this evening having right facial droop and right leg weakness, also slurred speech and altered mental status.  Review of Systems  A thorough review of systems was obtained and all systems are negative except as noted in the HPI and PMH.   Patient's Health History    Past Medical History:  Diagnosis Date   Nausea and vomiting    chronic for years.   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  Social History   Socioeconomic History   Marital status: Single    Spouse name: Not on file   Number of children: 1   Years of education: Not on file   Highest education level: Some college, no degree  Occupational History   Not on file  Tobacco Use   Smoking status: Former    Current packs/day: 0.25    Average packs/day: 0.3 packs/day for 20.0 years (5.0 ttl pk-yrs)    Types: Cigarettes   Smokeless tobacco: Never  Vaping Use   Vaping status: Never Used  Substance and Sexual Activity   Alcohol use: Not Currently   Drug use: Not Currently    Frequency: 2.0 times per week     Types: Marijuana   Sexual activity: Yes  Other Topics Concern   Not on file  Social History Narrative   Lives with parents   Has son 6 years-[name]      Enjoy: BB and son       Diet: everything    Caffeine: tea daily    Water : 3-4 bottles of 16 oz      Wear seat belt   Wear sun protection   Smoke detectors    Does not use phone while driving       Social Drivers of Corporate investment banker Strain: Low Risk  (10/31/2019)   Overall Financial Resource Strain (CARDIA)    Difficulty of Paying Living Expenses: Not hard at all  Food Insecurity: No Food Insecurity (10/31/2019)   Hunger Vital Sign    Worried About Running Out of Food in the Last Year: Never true    Ran Out of Food in the Last Year: Never true  Transportation Needs: No Transportation Needs (10/31/2019)   PRAPARE - Administrator, Civil Service (Medical): No    Lack of Transportation (Non-Medical): No  Physical Activity: Sufficiently Active (10/31/2019)   Exercise Vital Sign    Days of Exercise per Week: 3 days    Minutes of Exercise per Session: 60 min  Stress: No Stress Concern Present (10/31/2019)   [employer] of Occupational Health - Occupational Stress Questionnaire    Feeling of Stress : Not at all  Social Connections: Socially Isolated (10/31/2019)   Social Connection and Isolation Panel [NHANES]    Frequency of Communication with Friends and Family: More than three times a week    Frequency of Social Gatherings with Friends and Family: More than three times a week    Attends Religious Services: Never    Database administrator or Organizations: No    Attends Banker Meetings: Never    Marital Status: Divorced  Catering manager Violence: Not At Risk (10/31/2019)   Humiliation, Afraid, Rape, and Kick questionnaire    Fear of Current or Ex-Partner: No    Emotionally Abused: No    Physically Abused: No    Sexually Abused: No     Physical Exam   Vitals:   02/05/24 0530  02/05/24 0600  BP: (!) 158/93 (!) 166/93  Pulse: 71 77  Resp: 19 15  Temp:    SpO2: 98% 98%    CONSTITUTIONAL: Chronically ill-appearing, NAD NEURO/PSYCH: Right hemiplegia, slurred speech EYES:  eyes equal and reactive ENT/NECK:  no LAD, no JVD CARDIO: Regular rate, well-perfused, normal S1 and S2 PULM:  CTAB no wheezing or rhonchi GI/GU:  non-distended, non-tender MSK/SPINE:  No gross deformities, no edema SKIN:  no rash, atraumatic   *Additional and/or pertinent findings included in MDM below  Diagnostic and Interventional Summary    EKG Interpretation Date/Time:  Monday February 05 2024 04:12:10 EST Ventricular Rate:  81 PR Interval:  130 QRS Duration:  80 QT Interval:  355 QTC Calculation: 412 R Axis:   47  Text Interpretation: Sinus rhythm Baseline wander in lead(s) I III aVL Confirmed by Gwenetta Lennert [phone]) on 02/05/2024 6:15:54 AM       Labs Reviewed  APTT - Abnormal; Notable for the following components:      Result Value   aPTT 23 (*)    All other components within normal limits  CBC - Abnormal; Notable for the following components:   RBC 3.62 (*)    Hemoglobin 10.7 (*)    HCT 32.5 (*)    All other components within normal limits  COMPREHENSIVE METABOLIC PANEL - Abnormal; Notable for the following components:   Glucose, Bld 101 (*)    BUN 37 (*)    Creatinine, Ser 2.05 (*)    ALT 74 (*)    GFR, Estimated 41 (*)    All other components within normal limits  URINALYSIS, ROUTINE W REFLEX MICROSCOPIC - Abnormal; Notable for the following components:   Color, Urine STRAW (*)    Hgb urine dipstick SMALL (*)    Protein, ur 100 (*)    All other components within normal limits  CBG MONITORING, ED - Abnormal; Notable for the following components:   Glucose-Capillary 105 (*)    All other components within normal limits  RESP PANEL BY RT-PCR (RSV, FLU A&B, COVID)  RVPGX2  ETHANOL  PROTIME-INR  DIFFERENTIAL  RAPID URINE DRUG SCREEN, HOSP PERFORMED   I-STAT CHEM 8, ED    CT ANGIO HEAD NECK W WO CM  Final Result    CT HEAD CODE STROKE WO CONTRAST  Final Result    MR BRAIN WO CONTRAST    (Results Pending)    Medications  iohexol  (OMNIPAQUE ) 350 MG/ML injection 75 mL (75 mLs Intravenous Contrast Given 02/05/24 0350)     Procedures  /  Critical Care .Critical Care  Performed by: Edson Graces, MD Authorized by: Edson Graces, MD   Critical care provider statement:    Critical care time (minutes):  35   Critical care was necessary to treat or prevent imminent or life-threatening deterioration of the following conditions:  CNS failure or compromise   Critical care was time spent personally by me on the following activities:  Development of treatment plan with patient or surrogate, discussions with consultants, evaluation of patient's response to treatment, examination of patient, ordering and review of laboratory studies, ordering and review of radiographic studies, ordering and performing treatments and interventions, pulse oximetry, re-evaluation of patient's condition and review of old charts   ED Course and Medical Decision Making  Initial Impression and Ddx Initial concern for new or worsening stroke, history of stroke.  Code stroke initiated, obtaining CTA, teleneurology recommendations.  Airway intact, patient has obvious facial droop and right-sided deficits.  Past medical/surgical history that increases complexity of ED encounter: History of stroke  Interpretation of Diagnostics I personally reviewed the EKG and my interpretation is as follows: Sinus rhythm  Labs reveal mild acute kidney injury  Patient Reassessment and Ultimate Disposition/Management     Teleneurologist suspecting unmasking of old stroke symptoms based on chart review.  Obtaining MRI to evaluate for new stroke, looking for underlying causes of recrudescence.  Patient does have some signs of dehydration, providing fluids.  Recommending hospitalist  admission.  Patient management required discussion with the following services or consulting groups:  Hospitalist Service and Neurology  Complexity of Problems Addressed Acute illness or injury that poses threat of life of bodily function  Additional Data Reviewed and Analyzed Further history obtained from: EMS on arrival  Additional Factors Impacting ED Encounter Risk Consideration of hospitalization  Merrick Abe. Harless Lien, MD [facility] Emergency Medicine [facility] [email or web address]  Final Clinical Impressions(s) / ED Diagnoses     ICD-10-CM   1. Right sided weakness  R53.1       ED Discharge Orders     None        Discharge Instructions Discussed with and Provided to Patient:   Discharge Instructions   None      Edson Graces, MD 02/05/24 [phone]

## 2024-02-05 NOTE — ED Notes (Signed)
 EMS endorses that they found bed bugs on the pt and some dead ones on their stretcher.

## 2024-02-05 NOTE — Consult Note (Signed)
 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 CTH confirms large L MCA chronic infarct with no acute findings. NIHSS is 6. The patient presents with symptoms that were present since prior stroke but worsen than baseline. Clinical picture suggests unmasking of prior stroke symptoms due to possible causes to include toxic/metabolic/infectious diseases. Patient is not a candidate for IV thrombolytics. CTA is done to rule out LVO but with poor baseline function risks outweigh the benefits and he would not be NIR candidate. I recommend a workup of primary causes with appropriate treatment for these issues. If there is no identifiable cause or if symptoms do not respond to treatment, then an MRI brain may be needed. Otherwise no further stroke workup is needed at this time.  Our recommendations are outlined below.  Recommendations:        Stroke/Telemetry Floor       Neuro Checks       Bedside Swallow Eval       DVT Prophylaxis       IV Fluids, Normal Saline       Head of Bed 30 Degrees       Euglycemia and Avoid Hyperthermia (PRN Acetaminophen )       Initiate or continue Aspirin  81 MG daily       Get WORKUP for TOXIC/METABOLIC/INFECTIOUS causes, including ammonia level, cbc, cmp, tsh and UA.       hold sedating medications.       Can reasonably hold off on MRI and assess for primary causes as above but If no other cause is identified or symptoms do not improve with treatment of likely causes then an MRI brain is indicated.  Sign Out:       Discussed with Emergency Department  Provider    ------------------------------------------------------------------------------  Advanced Imaging: Advanced imaging has been ordered. Results pending.   Metrics: Last Known Well: 02/05/2024 00:00:00 Dispatch Time: 02/05/2024 03:35:06 Arrival Time: 02/05/2024 03:30:00 Initial Response Time: 02/05/2024 03:49:24 Symptoms: facial droop and slurred speech. Initial patient interaction: 02/05/2024 03:54:00 NIHSS Assessment Completed: 02/05/2024 03:56:58 Patient is not a candidate for Thrombolytic. Thrombolytic Medical Decision: 02/05/2024 03:58:00 Patient was not deemed candidate for Thrombolytic because of following reasons: other diagnosis suspected unmasking from chronic stroke.  I personally Reviewed the CT Head and it Showed chronic large L MCA infarct. No Acute Hemorrhage or Acute Core Infarct  Primary Provider Notified of Diagnostic Impression and Management Plan on: 02/05/2024 04:04:32    ------------------------------------------------------------------------------  History of Present Illness: Patient is a 43 year old Male.  Patient was brought by EMS for symptoms of facial droop and slurred speech. Stroke June 2024 with spastic R hemiplegia. He was treated with antiplatelets. LKW was 12AM. He has R facial droop and slurred speech which is not normal for him. He is bedbound at baseline. He is alert. He appears to have contraction in the R arm. He says he did have facial droop with his prior stroke but asked if his speech was like this before he he doesn't know. Chart history shows aphasia.   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  somnolence  tobacco disorder  recreationally drug us   diabetic neuropathy   unable to obtain due to:   Patient Cannot Speak  Medications:  Anticoagulant use:  Unknown Antiplatelet use: Unknown Reviewed EMR  for current medications  Allergies:   Allergies Unable To Obtain Due To: Patient Cannot Speak  Social History: Unable To Obtain Due To Patient Status : Patient Cannot Speak  Family History:  Family History Cannot Be Obtained Because:Patient Cannot Speak  ROS : ROS Cannot Be Obtained Because:  Patient Cannot Speak  Past Surgical History: Past Surgical History Cannot Be Obtained Because: Patient Cannot Speak There Is No Surgical History Contributory To Today's Visit    Examination: BP(137/84), Pulse(92), Blood Glucose(105) 1A: Level of Consciousness - Alert; keenly responsive + 0 1B: Ask Month and Age - 1 Question Right + 1 1C: Blink Eyes & Squeeze Hands - Performs Both Tasks + 0 2: Test Horizontal Extraocular Movements - Normal + 0 3: Test Visual Fields - No Visual Loss + 0 4: Test Facial Palsy (Use Grimace if Obtunded) - Partial paralysis (lower face) + 2 5A: Test Left Arm Motor Drift - No Drift for 10 Seconds + 0 5B: Test Right Arm Motor Drift - No Drift for 10 Seconds + 0 6A: Test Left Leg Motor Drift - No Drift for 5 Seconds + 0 6B: Test Right Leg Motor Drift - No Drift for 5 Seconds + 0 7: Test Limb Ataxia (FNF/Heel-Shin) - No Ataxia + 0 8: Test Sensation - Normal; No sensory loss + 0 9: Test Language/Aphasia - Severe Aphasia: Fragmentary Expression, Inference Needed, Cannot Identify Materials + 2 10: Test Dysarthria - Mild-Moderate Dysarthria: Slurring but can be understood + 1 11: Test Extinction/Inattention - No abnormality + 0  NIHSS Score: 6  NIHSS Free Text : baseline R hemiplegia  able to name few items  Pre-Morbid Modified Rankin Scale: 5 Points = Severe disability; bedridden, incontinent and requiring constant nursing care and attention  Spoke with : Dr Harless Lien I reviewed the available imaging via Rapid and initiated discussion with the primary provider  This consult was conducted in real time using interactive audio and Immunologist. Patient was  informed of the technology being used for this visit and agreed to proceed. Patient located in hospital and provider located at home/office setting.   Patient is being evaluated for possible acute neurologic impairment and high probability of imminent or life-threatening deterioration. I spent total of 35 minutes providing care to this patient, including time for face to face visit via telemedicine, review of medical records, imaging studies and discussion of findings with providers, the patient and/or family.   Dr Ilean Mall   [facility] For Inpatient follow-up with [facility] physician please call [facility] at [phone]. As we are not an outpatient service for any post hospital discharge needs please contact the hospital for assistance. If you have any questions for the [facility] physicians or need to reconsult for clinical or diagnostic changes please contact us  via [facility] at [phone].

## 2024-02-05 NOTE — Progress Notes (Signed)
 EEG complete - results pending

## 2024-02-05 NOTE — Progress Notes (Signed)
 Code Stroke 0330:EDP visualuized patient and sent patient to CT upon arrival.  0331: Code stroke cart activated.   Pt with R facial droop and aphasia. R side hemiplegia from previous stroke in June 2024.   0335: TSP paged at this time.  1610: Patient returned from CT at this time.  0354: Dr. Farrel Hones on camera at this time. Patient report and history provided  0405 NCCT imaging results provided to Dr. Farrel Hones via secure messaging.  9604: Dr. Farrel Hones on camera with patient.  5409: No TNK decision 0403: TSRN off camera.  MRS 5, NIH 6,

## 2024-02-05 NOTE — Plan of Care (Signed)
  Problem: Acute Rehab PT Goals(only PT should resolve) Goal: Pt Will Go Sit To Supine/Side Flowsheets (Taken 02/05/2024 1445) Pt will go Sit to Supine/Side: with moderate assist Goal: Patient Will Transfer Sit To/From Stand Flowsheets (Taken 02/05/2024 1445) Patient will transfer sit to/from stand:  with maximum assist  with moderate assist Goal: Pt Will Transfer Bed To Chair/Chair To Bed Flowsheets (Taken 02/05/2024 1445) Pt will Transfer Bed to Chair/Chair to Bed:  with max assist  with mod assist

## 2024-02-05 NOTE — Evaluation (Signed)
 Speech Language Pathology Evaluation Patient Details Name: Zachary Winters MRN: [id number] DOB: [birth year] Today's Date: 02/05/2024 Time: 1510-1535 SLP Time Calculation (min) (ACUTE ONLY): 25 min  Problem List:  Patient Active Problem List   Diagnosis Date Noted   CVA (cerebral vascular accident) (HCC) 02/05/2024   H/O nausea and vomiting 07/07/2023   Stroke (HCC) 06/27/2023   Apraxia 06/19/2023   Vitamin D  deficiency 06/15/2023   Acute stroke due to ischemia (HCC) 06/14/2023   Dysphonia 06/14/2023   Aphasia 06/14/2023   Tobacco use disorder 06/14/2023   Recreational drug use 06/14/2023   Diabetic neuropathy (HCC) 06/14/2023   Dehydration    Lactic acidosis 03/14/2021   SIRS (systemic inflammatory response syndrome) (HCC) 03/14/2021   Abdominal pain with vomiting 03/14/2021   Somnolence 03/14/2021   Intractable nausea and vomiting    Type 2 diabetes mellitus with hyperglycemia (HCC) 01/01/2020   Hyperlipidemia associated with type 2 diabetes mellitus (HCC) 01/01/2020   Hypertension associated with diabetes (HCC) 10/31/2019   Essential hypertension 10/31/2019   Past Medical History:  Past Medical History:  Diagnosis Date   Nausea and vomiting    chronic for years.   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 male with medical history significant for CVA with right-sided hemiplegia-wheelchair-bound, hypertension, dyslipidemia, and type 2 diabetes who presented to the ED  with noted seizure episode earlier this morning that was witnessed by family member that lasted approximately 1-2 minutes and was associated with some confusion afterwards.  It was also concerning that he had some worsening right-sided weakness as well as aphasia from his prior baseline.  No other concerns were otherwise noted.  He denies any nausea or vomiting or diarrhea and has been eating and drinking as per his usual baseline.  CT shows no acute CVA. Zachary Winters was admitted to inpatient rehabilitation on 06/27/2023 and discharged home on 07/25/2023 to receive Home Health Therapy  with [facility].   Assessment / Plan / Recommendation Clinical Impression  Pt seen for a cognitive linguistic evaluation due to admission with altered mental status, suspicious for acute neurological event. Head CT was negative for acute changes. Pt presents with moderate expressive aphasia, mild/mod apraxia, and mild dysarthria which has improved since hospitalized for his stroke in July 2024. Pt is generally able to answer personally relevant questions when given extra time and will allowance for sound substitution and addition errors. He was able to tell SLP that he is right handed, likes R&B music, football, basketball ([place]), lives with his sister and has a son. He needed min initiation assist to state the months of the year and tended to produce initial phonemes with a /b/ or a /p/ [name], [name], [name]"). He exhibits deficits in divergent naming, coming up with 4 items in concrete categories. Pt was in good spirits and appears motivated to continue improving verbal expression skills. Recommend outpatient SLP therapy to address speech/language needs (unsure if Pt is still receiving [facility] SLP  services). No further acute care needs identified at this time.    SLP Assessment  SLP Recommendation/Assessment: All further Speech Lanaguage Pathology  needs can be addressed in the next venue of care SLP Visit Diagnosis: Aphasia  (R47.01);Apraxia (R48.2);Dysarthria and anarthria (R47.1);Cognitive communication deficit (R41.841)    Recommendations for follow up therapy are one component of a multi-disciplinary discharge planning process, led by the attending physician.  Recommendations may be updated based on patient status, additional functional criteria and insurance authorization.    Follow Up Recommendations  Outpatient SLP    Assistance Recommended at Discharge  PRN  Functional Status Assessment Patient has had a recent decline in their functional status and demonstrates the ability to make significant improvements in function in a reasonable and predictable amount of time.  Frequency and Duration           SLP Evaluation Cognition  Overall Cognitive Status: History of cognitive impairments - at baseline Arousal/Alertness: Awake/alert Orientation Level: Oriented to person;Oriented to place;Disoriented to time;Disoriented to situation Year: 2025 Month:  (Pt did not know) Day of Week: Incorrect Memory: Impaired Memory Impairment: Retrieval deficit Awareness: Appears intact Problem Solving: Impaired Problem Solving Impairment: Verbal complex Executive Function: Sequencing;Organizing Sequencing: Impaired Sequencing Impairment: Verbal complex Organizing: Impaired Organizing Impairment: Verbal complex Safety/Judgment: Appears intact       Comprehension  Auditory Comprehension Overall Auditory Comprehension: Appears within functional limits for tasks assessed Yes/No Questions: Within Functional Limits Commands: Within Functional Limits Conversation: Complex Interfering Components: Processing speed;Working Radio broadcast assistant: Dietitian: Within [employer] Reading Comprehension Reading Status: Not tested    Expression Expression Primary Mode of Expression: Verbal Verbal Expression Overall Verbal Expression:  Impaired Initiation: Impaired Automatic Speech: Name;Social Response;Counting;Month of year Level of Generative/Spontaneous Verbalization: Phrase Repetition: Impaired Level of Impairment: Phrase level Naming: Impairment Responsive: 51-75% accurate Confrontation: Impaired Convergent: 50-74% accurate Divergent: 25-49% accurate Verbal Errors: Phonemic paraphasias Pragmatics: No impairment Interfering Components: Speech intelligibility Effective Techniques: Sentence completion;Semantic cues;Phonemic cues Non-Verbal Means of Communication: Not applicable Written Expression Dominant Hand: Right Written Expression: Not tested   Oral / Motor  Oral Motor/Sensory Function Overall Oral Motor/Sensory Function: Mild impairment Facial ROM: Reduced right Facial Symmetry: Abnormal symmetry right Facial Strength: Reduced right Facial Sensation: Reduced right Lingual ROM: Within Functional Limits Lingual Symmetry: Within Functional Limits Lingual Strength: Within Functional Limits Lingual Sensation: Within Functional Limits Velum: Within Functional Limits Mandible: Within Functional Limits Motor Speech Overall Motor Speech: Impaired at baseline Respiration: Within functional limits Phonation: Normal;Low vocal intensity Resonance: Within functional limits Articulation: Impaired Level of Impairment: Phrase Intelligibility: Intelligibility reduced Word: 50-74% accurate Phrase: 50-74% accurate Sentence: 50-74% accurate Conversation: 50-74% accurate Motor Planning: Impaired Level of Impairment: Word Motor Speech Errors: Aware;Unaware;Inconsistent (tends to substitute p/f, add /b/) Effective Techniques: Slow rate           Thank you,  Claudetta Cuba, CCC-SLP [phone]  Watson Robarge 02/05/2024, 4:09 PM

## 2024-02-06 DIAGNOSIS — I639 Cerebral infarction, unspecified: Secondary | ICD-10-CM | POA: Diagnosis not present

## 2024-02-06 LAB — LIPID PANEL
Cholesterol: 121 mg/dL (ref 0–200)
HDL: 35 mg/dL — ABNORMAL LOW (ref >40)
LDL Cholesterol: 76 mg/dL (ref 0–99)
Total CHOL/HDL Ratio: 3.5 {ratio}
Triglycerides: 52 mg/dL (ref <150)
VLDL: 10 mg/dL (ref 0–40)

## 2024-02-06 LAB — GLUCOSE, CAPILLARY
Glucose-Capillary: 77 mg/dL (ref 70–99)
Glucose-Capillary: 80 mg/dL (ref 70–99)
Glucose-Capillary: 92 mg/dL (ref 70–99)

## 2024-02-06 MED ORDER — LEVETIRACETAM 500 MG PO TABS: 500.0000 mg | ORAL_TABLET | Freq: Two times a day (BID) | ORAL | 2 refills | Status: DC

## 2024-02-06 NOTE — Discharge Summary (Signed)
Physician Discharge Summary  Zachary Winters UJW:[id number] DOB: [birth year] DOA: 02/05/2024  PCP: Gilmore Laroche, FNP  Admit date: 02/05/2024  Discharge date: 02/06/2024  Admitted From:Home  Disposition:  Home  Recommendations for Outpatient Follow-up:  Follow up with PCP in 1-2 weeks Follow up with Dr. Pearlean Brownie  at [facility] as scheduled on 4/28; continue aspirin and Plavix for 3 months due to worsening vertebral stenosis Started on Keppra 500 mg twice daily  Home Health:  Equipment/Devices:  Discharge Condition:Stable  CODE STATUS: Full  Diet recommendation: Heart Healthy  Brief/Interim Summary: Zachary Winters is a 43 y.o. male with medical history significant for CVA with right-sided hemiplegia-wheelchair-bound, hypertension, dyslipidemia, and type 2 diabetes who presented to the ED with noted seizure episode earlier this morning that was witnessed by family member that lasted approximately 1-2 minutes and was associated with some confusion afterwards.  It was also concerning that he had some worsening right-sided weakness as well as aphasia from his prior baseline.  Patient had imaging studies with brain MRI as well as CT head and angiogram with no significant findings.  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  He has been started on Keppra 500 mg twice daily at the recommendation of neurology and needs to remain on aspirin and Plavix for vertebral artery stenosis and follow-up with neurology as noted above on 4/28.  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  Discharge Instructions  Discharge  Instructions     Diet - low sodium heart healthy   Complete by: As directed    Increase activity slowly   Complete by: As directed       Allergies as of 02/06/2024       Reactions   Chocolate    vomiting   Metformin Nausea And Vomiting   Penicillins Swelling        Medication List     TAKE these medications    amLODipine 10 MG tablet Commonly known as: NORVASC Take 1 tablet (10 mg total) by mouth daily.   aspirin EC 81 MG tablet Take 81 mg by mouth daily. Swallow whole.   atorvastatin 80 MG tablet Commonly known as: LIPITOR Take 1 tablet (80 mg total) by mouth daily after supper.   baclofen 10 MG tablet Commonly known as: LIORESAL TAKE 1 AND 1/2 TABLETS(15 MG) BY MOUTH THREE TIMES DAILY   blood glucose meter kit and supplies Dispense based on patient and insurance preference. Check blood sugar four times daily as directed. (FOR ICD-10 E10.9, E11.9).   carvedilol 6.25 MG tablet Commonly known as: COREG TAKE 1 TABLET(6.25 MG) BY MOUTH TWICE DAILY WITH A MEAL   clopidogrel 75 MG tablet Commonly known as: PLAVIX Take 1 tablet (75 mg total) by mouth daily.   ezetimibe 10 MG tablet Commonly known as: Zetia Take 1 tablet (10 mg total) by mouth daily.   glimepiride 2 MG tablet Commonly known as: AMARYL Take 1 tablet (2 mg total) by mouth daily with breakfast.   insulin glargine 100 UNIT/ML Solostar Pen Commonly known as: LANTUS Inject 18 Units into the skin at bedtime.   levETIRAcetam 500 MG tablet Commonly known  as: KEPPRA Take 1 tablet (500 mg total) by mouth 2 (two) times daily.   Pen Needles 32G X 4 MM Misc pen needle BD Nano Ultra Fine 32G x 4mm DX: e11.40        Follow-up Information     Gilmore Laroche, FNP. Schedule an appointment as soon as possible for a visit in 1 week(s).   Specialty: Family Medicine Contact information: [address] [place] [address] [phone]         Micki Riley, MD. Go to.   Specialties:  Neurology, Radiology Contact information: [address] [place] [address] [phone]                Allergies  Allergen Reactions   Chocolate     vomiting   Metformin Nausea And Vomiting   Penicillins Swelling    Consultations: Neurology   Procedures/Studies: EEG adult Result Date: 02/05/2024 Charlsie Quest, MD     02/05/2024  3:03 PM Patient Name: Zachary Winters MRN: [id number] Epilepsy Attending: Charlsie Quest Referring Physician/Provider: Erick Blinks, DO Date: 02/05/2024 Duration: 22.45 mins Patient history:  43 year old male with prior left MCA stroke who presented with seizure-like activity. EEG to evaluate for seizure Level of alertness: Awake, drowsy AEDs during EEG study: LEV Technical aspects: This EEG study was done with scalp electrodes positioned according to the 10-20 International system of electrode placement. Electrical activity was reviewed with band pass filter of 1-70Hz , sensitivity of 7 uV/mm, display speed of 67mm/sec with a 60Hz  notched filter applied as appropriate. EEG data were recorded continuously and digitally stored.  Video monitoring was available and reviewed as appropriate. Description: The posterior dominant rhythm consists of 8 Hz activity of moderate voltage (25-35 uV) seen predominantly in posterior head regions, symmetric and reactive to eye opening and eye closing. Drowsiness was characterized by attenuation of the posterior background rhythm. Sleep was characterized by vertex waves, sleep spindles (12 to 14 Hz), maximal frontocentral region. EEG showed continuous 3 to 6 Hz theta-delta slowing in left fronto-temporal region. Hyperventilation and photic stimulation were not performed.   ABNORMALITY - Continuous slow, left fronto-temporal region IMPRESSION: This study is suggestive of cortical dysfunction arising from  left fronto-temporal region likely secondary to underlying encephalomalacia. No seizures or epileptiform  discharges were seen throughout the recording. Charlsie Quest   MR BRAIN WO CONTRAST Result Date: 02/05/2024 CLINICAL DATA:  Neuro deficit, acute, stroke suspected EXAM: MRI HEAD WITHOUT CONTRAST TECHNIQUE: Multiplanar, multiecho pulse sequences of the brain and surrounding structures were obtained without intravenous contrast. COMPARISON:  Same day head CT, brain MRI 06/19/2023 FINDINGS: Brain: Negative for an acute infarct no acute hemorrhage. No hydrocephalus. No extra-axial fluid collection. No mass effect. No mass lesion. There is a chronic left MCA territory infarct involving the left opercular and insular regions, as well as the left basal ganglia. There is a region of susceptibility artifact along the corona radiata on the left (series 15, image 32), favored to represent chronic hemosiderin deposition. No mass effect. No mass lesion. There is a background of mild overall chronic microvascular ischemic change Vascular: Normal flow voids. Skull and upper cervical spine: Normal marrow signal. Sinuses/Orbits: Moderate left-sided mastoid effusion. Paranasal sinuses are clear. Orbits are unremarkable. Other: None. IMPRESSION: 1. No acute intracranial process. 2. Chronic left MCA territory infarct. Electronically Signed   By: Lorenza Cambridge M.D.   On: 02/05/2024 07:55   CT ANGIO HEAD NECK W WO CM Result Date:  02/05/2024 CLINICAL DATA:  43 year old male code stroke presentation. History of previous left MCA infarct. EXAM: CT ANGIOGRAPHY HEAD AND NECK WITH AND WITHOUT CONTRAST TECHNIQUE: Multidetector CT imaging of the head and neck was performed using the standard protocol during bolus administration of intravenous contrast. Multiplanar CT image reconstructions and MIPs were obtained to evaluate the vascular anatomy. Carotid stenosis measurements (when applicable) are obtained utilizing NASCET criteria, using the distal internal carotid diameter as the denominator. RADIATION DOSE REDUCTION: This exam was  performed according to the departmental dose-optimization program which includes automated exposure control, adjustment of the mA and/or kV according to patient size and/or use of iterative reconstruction technique. CONTRAST:  75mL OMNIPAQUE IOHEXOL 350 MG/ML SOLN COMPARISON:  Plain head CT 0338 hours today.  Prior CTA 06/21/2023. FINDINGS: CTA NECK Skeleton: Carious wisdom tooth dentition. Reversal of the normal cervical lordosis. Occasional cervical spine disc and endplate degeneration. No acute osseous abnormality identified. Upper chest: Mild atelectasis but improved right upper lung ventilation compared to last year. Atelectatic changes to the trachea. Other neck: Noncontrast neck soft tissue spaces are within normal limits. Aortic arch: Bovine arch configuration.  No arch atherosclerosis. Right carotid system: Patent, mildly tortuous with no plaque or stenosis, stable. Left carotid system: Patent and mildly tortuous with no plaque or stenosis, stable. Vertebral arteries: Proximal right subclavian artery and right vertebral artery origin are normal. Right V1 soft and atherosclerotic plaque with moderate stenosis on series 6, image 532 is chronic, not significantly changed. Right vertebral artery remains patent with additional stenosis and irregularity at the V1/V2 junctions which is moderate and appears progressed. Right vertebral artery caliber distal to that is stable, the vessel remains patent to the skull base. Proximal left subclavian artery is normal. Diminutive left vertebral artery with atherosclerotic plaque at its origin and in the V1 segment. Extensive V2 segment plaque and stenosis. But the vessel remains patent in the skull base and has a better caliber in the distal V2 segment similar to last year. See intracranial findings below. CTA HEAD Posterior circulation: Distal left vertebral artery is critically stenosed in the proximal to mid V4 segment on series 6, image 285, with a very similar  appearance on the CTA last year. The vessel appears almost functionally occluded now. There is reconstitution of the distal V4 segment probably retrograde. Contralateral distal right vertebral artery remains patent to the vertebrobasilar junction without stenosis. Basilar artery is patent with mild irregularity and no stenosis. SCA and PCA origins are patent. Posterior communicating arteries are diminutive or absent. Left PCA branches are within normal limits. There is mild to moderate irregularity and stenosis now at the right P1/P2 junction (series 10, image 23) which is new or progressed. Distal right PCA enhancement persists. Anterior circulation: Both ICA siphons remain patent. However, on the left there is distal cavernous tortuosity and complex atherosclerosis resulting in up to severe stenosis in the anterior genu and the supraclinoid segment. See series 9, image 122 and series 6, image 253. This is chronic and barely similar to last year. Contralateral right ICA siphon is larger, patent through the cavernous segment without stenosis, but there is bulky right supraclinoid ICA calcified plaque and up to severe stenosis on series 6, image 250. This is chronic but may have progressed. Patent carotid termini. Chronically irregular left ICA terminus, left MCA and ACA origins, although mildly improved compared to last year. Right MCA and ACA origins are patent. Right A1 appears dominant. Bilateral ACA branches are within normal limits. Right MCA M1  segment is patent with mild irregularity and stenosis on series 11, image 22 which appears increased. Right MCA branches appear stable. Left MCA M1 segment is moderately irregular and stenotic (series 10, image 23) but improved. Left MCA bifurcation is patent. Left MCA branches are improved compared to last year. Venous sinuses: Patent. Anatomic variants: Affectively dominant right vertebral artery, right ACA A1. Review of the MIP images confirms the above findings  IMPRESSION: 1. Negative for emergent large vessel occlusion, but positive for severely age advanced atherosclerosis in both vertebral arteries, both carotid siphons and termini: - Critical stenosis, Near Occlusion of the Left Vertebral Artery V4 segment similar to but mildly progressed since 06/21/2023. - New Moderate irregularity and stenosis of the Right PCA P1/P2 junction. The increased irregularity here could affect Right thalamostriate vessels. - Tandem Moderate irregularity and stenosis of the proximal Right Vertebral Artery, V1 and V2 segments. The Right vertebral functionally supplies the Basilar. - Left ICA siphon distal cavernous and supraclinoid complex plaque with up to Severe Stenosis. Diminutive although improved Left terminus since last year. - Right ICA siphon bulky supraclinoid calcified plaque with up to Severe Stenosis which may have progressed since last year. 2. Irregular and stenotic left MCA but improved from the CTA last year, chronic left MCA territory infarct. 3. Other mild intracranial atherosclerosis. No significant carotid stenosis in the neck. Electronically Signed   By: Odessa Fleming M.D.   On: 02/05/2024 04:42   CT HEAD CODE STROKE WO CONTRAST Result Date: 02/05/2024 CLINICAL DATA:  Code stroke. Initial evaluation for acute neuro deficit, stroke suspected. EXAM: CT HEAD WITHOUT CONTRAST TECHNIQUE: Contiguous axial images were obtained from the base of the skull through the vertex without intravenous contrast. RADIATION DOSE REDUCTION: This exam was performed according to the departmental dose-optimization program which includes automated exposure control, adjustment of the mA and/or kV according to patient size and/or use of iterative reconstruction technique. COMPARISON:  Prior study from 06/19/2023 FINDINGS: Brain: Cerebral volume within normal limits. Encephalomalacia involving the left frontal lobe, consistent with prior left MCA distribution infarct. No acute intracranial  hemorrhage. No acute large vessel territory infarct. No mass lesion or midline shift. No hydrocephalus or extra-axial fluid collection. Vascular: No abnormal hyperdense vessel. Scattered vascular calcifications noted within the carotid siphons. Skull: Scalp soft tissues demonstrate no acute finding. Calvarium intact. Sinuses/Orbits: Left gaze preference noted. Paranasal sinuses are largely clear. Moderate left mastoid effusion noted. Other: None. ASPECTS [facility] Stroke Program Early CT Score) - Ganglionic level infarction (caudate, lentiform nuclei, internal capsule, insula, M1-M3 cortex): 7 - Supraganglionic infarction (M4-M6 cortex): 3 Total score (0-10 with 10 being normal): 10 IMPRESSION: 1. No acute intracranial abnormality. Aspects is 10. 2. Encephalomalacia involving the left frontal lobe, consistent with prior left MCA distribution infarct. 3. Moderate left mastoid effusion. Results were called by telephone at the time of interpretation on 02/05/2024 at 3:51 am to provider [facility] , who verbally acknowledged these results. Electronically Signed   By: Rise Mu M.D.   On: 02/05/2024 03:51     Discharge Exam: Vitals:   02/05/24 2147 02/06/24 0332  BP: 125/79 128/82  Pulse: 75 75  Resp: 16 17  Temp: 98.4 F (36.9 C) 98.7 F (37.1 C)  SpO2: 100% 100%   Vitals:   02/05/24 1414 02/05/24 1831 02/05/24 2147 02/06/24 0332  BP: (!) 143/84 (!) 127/91 125/79 128/82  Pulse: 67 81 75 75  Resp: 14 16 16 17   Temp: 98.2 F (36.8 C) 98.4 F (36.9 C)  98.4 F (36.9 C) 98.7 F (37.1 C)  TempSrc: Oral Oral Oral Oral  SpO2: 100% 99% 100% 100%  Weight:        General: Pt is alert, awake, not in acute distress Cardiovascular: RRR, S1/S2 +, no rubs, no gallops Respiratory: CTA bilaterally, no wheezing, no rhonchi Abdominal: Soft, NT, ND, bowel sounds + Extremities: no edema, no cyanosis    The results of significant diagnostics from this hospitalization (including imaging,  microbiology, ancillary and laboratory) are listed below for reference.     Microbiology: Recent Results (from the past 240 hours)  Resp panel by RT-PCR (RSV, Flu A&B, Covid) Anterior Nasal Swab     Status: None   Collection Time: 02/05/24  4:37 AM   Specimen: Anterior Nasal Swab  Result Value Ref Range Status   SARS Coronavirus 2 by RT PCR NEGATIVE NEGATIVE Final    Comment: (NOTE) SARS-CoV-2 target nucleic acids are NOT DETECTED.  The SARS-CoV-2 RNA is generally detectable in upper respiratory specimens during the acute phase of infection. The lowest concentration of SARS-CoV-2 viral copies this assay can detect is 138 copies/mL. A negative result does not preclude SARS-Cov-2 infection and should not be used as the sole basis for treatment or other patient management decisions. A negative result may occur with  improper specimen collection/handling, submission of specimen other than nasopharyngeal swab, presence of viral mutation(s) within the areas targeted by this assay, and inadequate number of viral copies(<138 copies/mL). A negative result must be combined with clinical observations, patient history, and epidemiological information. The expected result is Negative.  Fact Sheet for Patients:  BloggerCourse.com  Fact Sheet for Healthcare Providers:  SeriousBroker.it  This test is no t yet approved or cleared by the [place] FDA and  has been authorized for detection and/or diagnosis of SARS-CoV-2 by FDA under an Emergency Use Authorization (EUA). This EUA will remain  in effect (meaning this test can be used) for the duration of the COVID-19 declaration under Section 564(b)(1) of the Act, 21 U.S.C.section 360bbb-3(b)(1), unless the authorization is terminated  or revoked sooner.       Influenza A by PCR NEGATIVE NEGATIVE Final   Influenza B by PCR NEGATIVE NEGATIVE Final    Comment: (NOTE) The Xpert Xpress  SARS-CoV-2/FLU/RSV plus assay is intended as an aid in the diagnosis of influenza from Nasopharyngeal swab specimens and should not be used as a sole basis for treatment. Nasal washings and aspirates are unacceptable for Xpert Xpress SARS-CoV-2/FLU/RSV testing.  Fact Sheet for Patients: BloggerCourse.com  Fact Sheet for Healthcare Providers: SeriousBroker.it  This test is not yet approved or cleared by the [place] FDA and has been authorized for detection and/or diagnosis of SARS-CoV-2 by FDA under an Emergency Use Authorization (EUA). This EUA will remain in effect (meaning this test can be used) for the duration of the COVID-19 declaration under Section 564(b)(1) of the Act, 21 U.S.C. section 360bbb-3(b)(1), unless the authorization is terminated or revoked.     Resp Syncytial Virus by PCR NEGATIVE NEGATIVE Final    Comment: (NOTE) Fact Sheet for Patients: BloggerCourse.com  Fact Sheet for Healthcare Providers: SeriousBroker.it  This test is not yet approved or cleared by the [place] FDA and has been authorized for detection and/or diagnosis of SARS-CoV-2 by FDA under an Emergency Use Authorization (EUA). This EUA will remain in effect (meaning this test can be used) for the duration of the COVID-19 declaration under Section 564(b)(1) of the Act, 21 U.S.C. section 360bbb-3(b)(1), unless the  authorization is terminated or revoked.  Performed at [facility], [address] [place], [place] [address]      Labs: BNP (last 3 results) No results for input(s): "BNP" in the last 8760 hours. Basic Metabolic Panel: Recent Labs  Lab 02/05/24 0410  NA 140  K 4.2  CL 109  CO2 24  GLUCOSE 101*  BUN 37*  CREATININE 2.05*  CALCIUM 8.9   Liver Function Tests: Recent Labs  Lab 02/05/24 0410  AST 40  ALT 74*  ALKPHOS 99  BILITOT 0.3  PROT 7.4  ALBUMIN 3.6    No results for input(s): "LIPASE", "AMYLASE" in the last 168 hours. No results for input(s): "AMMONIA" in the last 168 hours. CBC: Recent Labs  Lab 02/05/24 0410  WBC 7.6  NEUTROABS 5.1  HGB 10.7*  HCT 32.5*  MCV 89.8  PLT 228   Cardiac Enzymes: No results for input(s): "CKTOTAL", "CKMB", "CKMBINDEX", "TROPONINI" in the last 168 hours. BNP: Invalid input(s): "POCBNP" CBG: Recent Labs  Lab 02/05/24 1700 02/05/24 1959 02/06/24 0025 02/06/24 0339 02/06/24 0752  GLUCAP 133* 183* 92 80 77   D-Dimer No results for input(s): "DDIMER" in the last 72 hours. Hgb A1c No results for input(s): "HGBA1C" in the last 72 hours. Lipid Profile Recent Labs    02/06/24 0414  CHOL 121  HDL 35*  LDLCALC 76  TRIG 52  CHOLHDL 3.5   Thyroid function studies No results for input(s): "TSH", "T4TOTAL", "T3FREE", "THYROIDAB" in the last 72 hours.  Invalid input(s): "FREET3" Anemia work up No results for input(s): "VITAMINB12", "FOLATE", "FERRITIN", "TIBC", "IRON", "RETICCTPCT" in the last 72 hours. Urinalysis    Component Value Date/Time   COLORURINE STRAW (A) 02/05/2024 0500   APPEARANCEUR CLEAR 02/05/2024 0500   LABSPEC 1.021 02/05/2024 0500   PHURINE 6.0 02/05/2024 0500   GLUCOSEU NEGATIVE 02/05/2024 0500   HGBUR SMALL (A) 02/05/2024 0500   BILIRUBINUR NEGATIVE 02/05/2024 0500   KETONESUR NEGATIVE 02/05/2024 0500   PROTEINUR 100 (A) 02/05/2024 0500   NITRITE NEGATIVE 02/05/2024 0500   LEUKOCYTESUR NEGATIVE 02/05/2024 0500   Sepsis Labs Recent Labs  Lab 02/05/24 0410  WBC 7.6   Microbiology Recent Results (from the past 240 hours)  Resp panel by RT-PCR (RSV, Flu A&B, Covid) Anterior Nasal Swab     Status: None   Collection Time: 02/05/24  4:37 AM   Specimen: Anterior Nasal Swab  Result Value Ref Range Status   SARS Coronavirus 2 by RT PCR NEGATIVE NEGATIVE Final    Comment: (NOTE) SARS-CoV-2 target nucleic acids are NOT DETECTED.  The SARS-CoV-2 RNA is generally  detectable in upper respiratory specimens during the acute phase of infection. The lowest concentration of SARS-CoV-2 viral copies this assay can detect is 138 copies/mL. A negative result does not preclude SARS-Cov-2 infection and should not be used as the sole basis for treatment or other patient management decisions. A negative result may occur with  improper specimen collection/handling, submission of specimen other than nasopharyngeal swab, presence of viral mutation(s) within the areas targeted by this assay, and inadequate number of viral copies(<138 copies/mL). A negative result must be combined with clinical observations, patient history, and epidemiological information. The expected result is Negative.  Fact Sheet for Patients:  BloggerCourse.com  Fact Sheet for Healthcare Providers:  SeriousBroker.it  This test is no t yet approved or cleared by the [place] FDA and  has been authorized for detection and/or diagnosis of SARS-CoV-2 by FDA under an Emergency Use Authorization (EUA). This  EUA will remain  in effect (meaning this test can be used) for the duration of the COVID-19 declaration under Section 564(b)(1) of the Act, 21 U.S.C.section 360bbb-3(b)(1), unless the authorization is terminated  or revoked sooner.       Influenza A by PCR NEGATIVE NEGATIVE Final   Influenza B by PCR NEGATIVE NEGATIVE Final    Comment: (NOTE) The Xpert Xpress SARS-CoV-2/FLU/RSV plus assay is intended as an aid in the diagnosis of influenza from Nasopharyngeal swab specimens and should not be used as a sole basis for treatment. Nasal washings and aspirates are unacceptable for Xpert Xpress SARS-CoV-2/FLU/RSV testing.  Fact Sheet for Patients: BloggerCourse.com  Fact Sheet for Healthcare Providers: SeriousBroker.it  This test is not yet approved or cleared by the [place] FDA  and has been authorized for detection and/or diagnosis of SARS-CoV-2 by FDA under an Emergency Use Authorization (EUA). This EUA will remain in effect (meaning this test can be used) for the duration of the COVID-19 declaration under Section 564(b)(1) of the Act, 21 U.S.C. section 360bbb-3(b)(1), unless the authorization is terminated or revoked.     Resp Syncytial Virus by PCR NEGATIVE NEGATIVE Final    Comment: (NOTE) Fact Sheet for Patients: BloggerCourse.com  Fact Sheet for Healthcare Providers: SeriousBroker.it  This test is not yet approved or cleared by the [place] FDA and has been authorized for detection and/or diagnosis of SARS-CoV-2 by FDA under an Emergency Use Authorization (EUA). This EUA will remain in effect (meaning this test can be used) for the duration of the COVID-19 declaration under Section 564(b)(1) of the Act, 21 U.S.C. section 360bbb-3(b)(1), unless the authorization is terminated or revoked.  Performed at [facility], [address], [place], [place] [address]      Time coordinating discharge: 35 minutes  SIGNED:   Erick Blinks, DO [facility] 02/06/2024, 9:10 AM  If 7PM-7AM, please contact night-coverage [email or web address]

## 2024-02-06 NOTE — TOC Transition Note (Signed)
Transition of Care [facility]) - Discharge Note   Patient Details  Name: Zachary Winters MRN: [id number] Date of Birth: [birth year]  Transition of Care [facility]) CM/SW Contact:  Leitha Bleak, RN Phone Number: 02/06/2024, 12:18 PM   Clinical Narrative:   Patient discharging home needing HHPT/OT. MD ordered. TOC reached out to find an agency that would accept his insurance. Last admission patient went to CIR and discharged with [facility]. Victorino Dike is accepting patient for home health again.     Final next level of care: Home w Home Health Services Barriers to Discharge: Barriers Resolved   Patient Goals and CMS Choice Patient states their goals for this hospitalization and ongoing recovery are:: to go home CMS Medicare.gov Compare Post Acute Care list provided to:: Patient Choice offered to / list presented to : Patient     Discharge Placement                  Patient and family notified of of transfer: 02/06/24  Discharge Plan and Services Additional resources added to the After Visit Summary for        [facility] Arranged: PT, OT HH Agency: [facility] Date [facility] Agency Contacted: 02/06/24 Time HH Agency Contacted: 1000 Representative spoke with at [facility] Agency: Victorino Dike  Social Drivers of Health (SDOH) Interventions SDOH Screenings   Food Insecurity: No Food Insecurity (02/05/2024)  Housing: Low Risk  (02/05/2024)  Transportation Needs: No Transportation Needs (02/05/2024)  Utilities: Not At Risk (02/05/2024)  Depression (PHQ2-9): Low Risk  (01/18/2024)  Financial Resource Strain: Low Risk  (10/31/2019)  Physical Activity: Sufficiently Active (10/31/2019)  Social Connections: Socially Isolated (10/31/2019)  Stress: No Stress Concern Present (10/31/2019)  Tobacco Use: Medium Risk (01/18/2024)     Readmission Risk Interventions     No data to display

## 2024-02-13 ENCOUNTER — Telehealth: Payer: Self-pay

## 2024-02-13 NOTE — Telephone Encounter (Signed)
 Copied from CRM [id number]. Topic: Clinical - Home Health Verbal Orders >> Feb 12, 2024  8:35 AM Hector Shade B wrote: Caller/Agency: [facility] Byrd Hesselbach) Callback Number: [phone] Service Requested: Physical Therapy Frequency: 1 time a week for 9 weeks Any new concerns about the patient? Yes  Will all need a 3 in one commode for the patient

## 2024-02-14 NOTE — Telephone Encounter (Signed)
 Copied from CRM [id number]. Topic: Clinical - Home Health Verbal Orders >> Feb 14, 2024 10:46 AM Fuller Mandril wrote: Caller/Agency: Byrd Hesselbach PT with [facility]  Callback Number: [phone] Service Requested: Following up on status of previous request for PT.  Frequency: N/A Any new concerns about the patient? N/A

## 2024-02-19 ENCOUNTER — Telehealth: Payer: Self-pay

## 2024-02-19 NOTE — Telephone Encounter (Signed)
Kindly place orders.

## 2024-02-20 NOTE — Telephone Encounter (Signed)
 Zachary Winters with [facility] called to f/u on orders placed on last Monday. Advised per notes on 2/24 provider stated to kindly place the orders.

## 2024-02-21 NOTE — Telephone Encounter (Signed)
 Orders confirmed. Equipment ordered and awaiting insurance clearance for shipping.

## 2024-02-22 ENCOUNTER — Other Ambulatory Visit: Payer: Self-pay | Admitting: Family Medicine

## 2024-02-22 DIAGNOSIS — E1169 Type 2 diabetes mellitus with other specified complication: Secondary | ICD-10-CM

## 2024-02-22 DIAGNOSIS — I63512 Cerebral infarction due to unspecified occlusion or stenosis of left middle cerebral artery: Secondary | ICD-10-CM

## 2024-02-22 DIAGNOSIS — I10 Essential (primary) hypertension: Secondary | ICD-10-CM

## 2024-02-22 DIAGNOSIS — E1140 Type 2 diabetes mellitus with diabetic neuropathy, unspecified: Secondary | ICD-10-CM

## 2024-03-08 ENCOUNTER — Encounter: Payer: Medicaid Other | Attending: Registered Nurse | Admitting: Physical Medicine & Rehabilitation

## 2024-03-08 ENCOUNTER — Encounter: Payer: Self-pay | Admitting: Physical Medicine & Rehabilitation

## 2024-03-08 VITALS — BP 129/84 | HR 80 | Ht 73.0 in

## 2024-03-08 DIAGNOSIS — I69351 Hemiplegia and hemiparesis following cerebral infarction affecting right dominant side: Secondary | ICD-10-CM | POA: Diagnosis present

## 2024-03-08 DIAGNOSIS — M62838 Other muscle spasm: Secondary | ICD-10-CM | POA: Diagnosis not present

## 2024-03-08 NOTE — Telephone Encounter (Signed)
 Patient's sister Tessie Eke [phone] is calling to follow up on this request. In order to continue OT, wanting an order to be place for PT outside the home.

## 2024-03-08 NOTE — Progress Notes (Signed)
 Subjective:    Patient ID: Zachary Winters, male    DOB: Oct 10, 1981, 43 y.o.   MRN: [id number]  HPI 43 year old male with history of smoking, hypertension and hyperlipidemia who suffered a large left MCA distribution infarct approximately 3 months ago.  He completed inpatient rehab and spent about 3 weeks at [facility] neuro rehab unit. He is now living at home with the assistance of his sister.  He still requires assistance with mobility as well as with dressing and bathing. He has severe spasticity right upper extremity greater than right lower extremity According to the sister the patient is experiencing right shoulder pain when the right shoulder is moved during dressing activities  Botox 01/18/24 Pectoralis 50U Biceps 75 units Brachialis 75 units Brachial radialis 50 units FCR 50 units FDS 50 units FDP 50 units  10/20/23 Right upper extremity dominant side Biceps 50 units Brachialis 50 units Brachial radialis 50 units FCR 50 units FDS 50 units FDP 50 units Pain Inventory Average Pain 0 Pain Right Now 0 My pain is  no pain  In the last 24 hours, has pain interfered with the following? General activity 0 Relation with others 0 Enjoyment of life 0 What TIME of day is your pain at its worst? . Sleep (in general) Fair  Pain is worse with:  no pain Pain improves with:  no pain Relief from Meds:  no pain  No family history on file. Social History   Socioeconomic History   Marital status: Single    Spouse name: Not on file   Number of children: 1   Years of education: Not on file   Highest education level: Some college, no degree  Occupational History   Not on file  Tobacco Use   Smoking status: Former    Current packs/day: 0.25    Average packs/day: 0.3 packs/day for 20.0 years (5.0 ttl pk-yrs)    Types: Cigarettes   Smokeless tobacco: Never  Vaping Use   Vaping status: Never Used  Substance and Sexual Activity   Alcohol use: Not Currently   Drug use:  Not Currently    Frequency: 2.0 times per week    Types: Marijuana   Sexual activity: Yes  Other Topics Concern   Not on file  Social History Narrative   Lives with parents   Has son 6 years-[name]      Enjoy: BB and son       Diet: everything    Caffeine: tea daily    Water: 3-4 bottles of 16 oz      Wear seat belt   Wear sun protection   Smoke detectors    Does not use phone while driving       Social Drivers of Corporate investment banker Strain: Low Risk  (10/31/2019)   Overall Financial Resource Strain (CARDIA)    Difficulty of Paying Living Expenses: Not hard at all  Food Insecurity: No Food Insecurity (02/05/2024)   Hunger Vital Sign    Worried About Running Out of Food in the Last Year: Never true    Ran Out of Food in the Last Year: Never true  Transportation Needs: No Transportation Needs (02/05/2024)   PRAPARE - Administrator, Civil Service (Medical): No    Lack of Transportation (Non-Medical): No  Physical Activity: Sufficiently Active (10/31/2019)   Exercise Vital Sign    Days of Exercise per Week: 3 days    Minutes of Exercise per Session: 60 min  Stress: No Stress Concern Present (10/31/2019)   [employer] of Occupational Health - Occupational Stress Questionnaire    Feeling of Stress : Not at all  Social Connections: Socially Isolated (10/31/2019)   Social Connection and Isolation Panel [NHANES]    Frequency of Communication with Friends and Family: More than three times a week    Frequency of Social Gatherings with Friends and Family: More than three times a week    Attends Religious Services: Never    Database administrator or Organizations: No    Attends Banker Meetings: Never    Marital Status: Divorced   Past Surgical History:  Procedure Laterality Date   TEE WITHOUT CARDIOVERSION N/A 06/16/2023   Procedure: TRANSESOPHAGEAL ECHOCARDIOGRAM (TEE);  Surgeon: Jonelle Sidle, MD;  Location: [facility];  Service:  Cardiovascular;  Laterality: N/A;   Past Surgical History:  Procedure Laterality Date   TEE WITHOUT CARDIOVERSION N/A 06/16/2023   Procedure: TRANSESOPHAGEAL ECHOCARDIOGRAM (TEE);  Surgeon: Jonelle Sidle, MD;  Location: [facility];  Service: Cardiovascular;  Laterality: N/A;   Past Medical History:  Diagnosis Date   Nausea and vomiting    chronic for years.   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)    decreased vision--has been getting injections   Type 1 diabetes mellitus (HCC)    Diagnosed at age 79 (has been on insulin every since dx)   BP 129/84   Pulse 80   Ht 6\' 1"  (1.854 m)   SpO2 99%   BMI 25.60 kg/m   Opioid Risk Score:   Fall Risk Score:  `1  Depression screen [facility] 2/9     01/18/2024    3:43 PM 12/01/2023    3:30 PM 09/15/2023   11:14 AM 08/22/2023    9:38 AM 08/09/2023    1:39 PM 01/21/2021   11:15 AM 01/01/2020    3:06 PM  Depression screen PHQ 2/9  Decreased Interest 0 0 0 0 0 1 0  Down, Depressed, Hopeless 0 0 0 0 0 2 0  PHQ - 2 Score 0 0 0 0 0 3 0  Altered sleeping    0 0 2   Tired, decreased energy    0 0 1   Change in appetite    0 0 2   Feeling bad or failure about yourself     0 0 0   Trouble concentrating    0 0 1   Moving slowly or fidgety/restless    0 0 0   Suicidal thoughts    0 0 0   PHQ-9 Score    0 0 9   Difficult doing work/chores    Not difficult at all        Review of Systems  Musculoskeletal:  Positive for gait problem.  Neurological:  Positive for weakness.  All other systems reviewed and are negative.      Objective:   Physical Exam General No acute distress Mood and affect appropriate Extremities without edema Right shoulder pain with abduction of around 90 degrees and forward flexion around 80 degrees Motor strength is 2 - at the deltoid bicep and tricep 0 at the wrist and finger flexors and extensors Tone MAS 3 at the finger flexors MAS 2 at the wrist flexor  MAS 2 at the elbow flexor MAS 3 at the shoulder adductor's  Severe expressive aphasia, receptive is relatively intact able to follow  some commands     Assessment & Plan:  1.  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  Do not think that the pectoralis injection was of much benefit therefore we will not reinject  Biceps 75 units Brachialis 75 units Brachial radialis 50 units FCR 50 units FDS 75 units FDP 75 units   2.  PostCVA shoulder pain appears to be mainly due to shoulder impingement.  We discussed corticosteroid injection patient would like to hold off for now continue with PT OT

## 2024-03-11 ENCOUNTER — Telehealth: Payer: Self-pay | Admitting: Family Medicine

## 2024-03-11 ENCOUNTER — Other Ambulatory Visit: Payer: Self-pay

## 2024-03-11 DIAGNOSIS — I639 Cerebral infarction, unspecified: Secondary | ICD-10-CM

## 2024-03-11 NOTE — Telephone Encounter (Signed)
 CAP forms  Noted Copied Sleeved (put in provider box)  Call patient when ready and fax a copy to [phone]

## 2024-03-11 NOTE — Telephone Encounter (Signed)
 Orders placed to [facility].

## 2024-03-20 ENCOUNTER — Encounter (HOSPITAL_COMMUNITY): Payer: Self-pay | Admitting: Occupational Therapy

## 2024-03-20 ENCOUNTER — Ambulatory Visit (HOSPITAL_COMMUNITY): Attending: Family Medicine | Admitting: Occupational Therapy

## 2024-03-20 DIAGNOSIS — I69351 Hemiplegia and hemiparesis following cerebral infarction affecting right dominant side: Secondary | ICD-10-CM | POA: Diagnosis present

## 2024-03-20 DIAGNOSIS — R278 Other lack of coordination: Secondary | ICD-10-CM | POA: Insufficient documentation

## 2024-03-20 DIAGNOSIS — I639 Cerebral infarction, unspecified: Secondary | ICD-10-CM | POA: Diagnosis not present

## 2024-03-20 DIAGNOSIS — R29818 Other symptoms and signs involving the nervous system: Secondary | ICD-10-CM | POA: Insufficient documentation

## 2024-03-21 NOTE — Therapy (Signed)
 OUTPATIENT OCCUPATIONAL THERAPY NEURO EVALUATION  Patient Name: Zachary Winters MRN: [id number] DOB:24-Jul-1981, 43 y.o., male Today's Date: 03/21/2024  PCP: Gilmore Laroche, FNP REFERRING PROVIDER: Gilmore Laroche, FNP  END OF SESSION:  OT End of Session - 03/21/24 2104     Visit Number 1    Number of Visits 8    Date for OT Re-Evaluation 04/26/24    Authorization Type [employer]    Authorization Time Period Requesting 8 visits    Authorization - Visit Number 0    Authorization - Number of Visits 8    OT Start Time 1523    OT Stop Time 1556    OT Time Calculation (min) 33 min    Activity Tolerance Patient tolerated treatment well    Behavior During Therapy WFL for tasks assessed/performed             Past Medical History:  Diagnosis Date   Nausea and vomiting    chronic for years.   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)    decreased vision--has been getting injections   Type 1 diabetes mellitus (HCC)    Diagnosed at age 64 (has been on insulin every since dx)   Past Surgical History:  Procedure Laterality Date   TEE WITHOUT CARDIOVERSION N/A 06/16/2023   Procedure: TRANSESOPHAGEAL ECHOCARDIOGRAM (TEE);  Surgeon: Jonelle Sidle, MD;  Location: [facility];  Service: Cardiovascular;  Laterality: N/A;   Patient Active Problem List   Diagnosis Date Noted   CVA (cerebral vascular accident) (HCC) 02/05/2024   H/O nausea and vomiting 07/07/2023   Stroke (HCC) 06/27/2023   Apraxia 06/19/2023   Vitamin D deficiency 06/15/2023   Acute stroke due to ischemia (HCC) 06/14/2023   Dysphonia 06/14/2023   Aphasia 06/14/2023   Tobacco use disorder 06/14/2023   Recreational drug use 06/14/2023   Diabetic neuropathy (HCC) 06/14/2023   Dehydration    Lactic acidosis 03/14/2021   SIRS (systemic inflammatory response syndrome) (HCC) 03/14/2021   Abdominal pain with vomiting 03/14/2021   Somnolence 03/14/2021    Intractable nausea and vomiting    Type 2 diabetes mellitus with hyperglycemia (HCC) 01/01/2020   Hyperlipidemia associated with type 2 diabetes mellitus (HCC) 01/01/2020   Hypertension associated with diabetes (HCC) 10/31/2019   Essential hypertension 10/31/2019    ONSET DATE: 06/10/23  REFERRING DIAG:  I63.9 (ICD-10-CM) - Acute stroke due to ischemia (HCC)  I63.9 (ICD-10-CM) - Cerebrovascular accident (CVA), unspecified mechanism (HCC)    THERAPY DIAG:  Other lack of coordination  Hemiplegia and hemiparesis following cerebral infarction affecting right dominant side (HCC)  Other symptoms and signs involving the nervous system  Rationale for Evaluation and Treatment: Rehabilitation  SUBJECTIVE:   SUBJECTIVE STATEMENT: "I can do some stuff" Pt accompanied by: self and family member  PERTINENT HISTORY: medical history significant for CVA with right-sided hemiplegia-wheelchair-bound, hypertension, dyslipidemia, and type 2 diabetes.  PRECAUTIONS: Fall  WEIGHT BEARING RESTRICTIONS: No  PAIN:  Are you having pain? Yes: NPRS scale: 5/10 Pain location: R side of body Pain description: Tingling and aching Aggravating factors: anything Relieving factors: unsure  FALLS: Has patient fallen in last 6 months? No  LIVING ENVIRONMENT: Lives with: lives with their family Lives in: House/apartment Stairs: No Has following equipment at home: Dan Humphreys - 2 wheeled, Wheelchair (manual), shower chair, bed side commode, and Grab bars  PLOF: Needs assistance with ADLs and Needs assistance with transfers  PATIENT GOALS: To  improve independence  OBJECTIVE:  Note: Objective measures were completed at Evaluation unless otherwise noted.  HAND DOMINANCE: Right  ADLs: Overall ADLs: Pt has RUE hemiplegia with limited active movement and severe tone. Pt requires mod to max assist with all ADL's, including but not limited to dressing, grooming, and bathing. He also requires mod to max assist  for all transfers.  MOBILITY STATUS: Needs Assist: mod to max assist, mainly WC bound with stand pivot transfers  POSTURE COMMENTS:  rounded shoulders and forward head Sitting balance: Sits without support for 30 sec  ACTIVITY TOLERANCE: Activity tolerance: pt reports increased fatigue.  FUNCTIONAL OUTCOME MEASURES: Quick Dash: 79.55  UPPER EXTREMITY ROM:    Pt has hypertonicity with limited tolerance for passive stretching - all motions passively range less than 30% of full motion.  UPPER EXTREMITY MMT:     Unable to test strength due to tone.  HAND FUNCTION: unable  COORDINATION: unable  SENSATION: Limited sensation on entire R side  EDEMA: Moderate edema noted in RUE from elbow to fingers  MUSCLE TONE: RUE: Severe and Hypertonic  COGNITION: Overall cognitive status: Impaired  VISION: Subjective report: "no change" Baseline vision: No visual deficits Visual history:  Reports no vision difficulties  VISION ASSESSMENT: Not tested  Patient has difficulty with following activities due to following visual impairments: None  OBSERVATIONS: Pt has moderate expressive aphasia.                                                                                                                             TREATMENT DATE:  03/20/24: Evaluation only    PATIENT EDUCATION: Education details: Educational psychologist Person educated: Patient Education method: Explanation, Facilities manager, and Handouts Education comprehension: verbalized understanding and returned demonstration  HOME EXERCISE PROGRAM: 3/26: Self Stretches   GOALS: Goals reviewed with patient? Yes  SHORT TERM GOALS: Target date: 04/26/24    Pt and caregiver will be provided with and educated on HEP to improve mobility of RUE and independence in self-care tasks.    Goal status: INITIAL   2.   Pt and caregiver will be educated on weightbearing techniques to reduce pain, subluxation, and tone and improve  tolerance to ROM tasks.    Goal status: INITIAL   3.  Pt will improve RUE strength by at least 1 muscle grade to improve ability to actively participate in self-care tasks using RUE as stabilization or gross assist.    Goal status: INITIAL   4.   Pt will improve ability to make a gross grasp to approximately 25% or greater, to improve ability to use right hand as gross assist when stabilizing items.    Goal status: INITIAL   5.  Pt will decrease pain in RUE to 4/10 or less as noted by verbalizing decreased pain and minimal facial grimacing during session activities.    Goal status: INITIAL   6.  Pt will increase A/ROM to 25% in shoulder, elbow, and wrist, to  improve mobility required for participation in ADL tasks.   ASSESSMENT:  CLINICAL IMPRESSION: Patient is a 43 y.o. male who was seen today for occupational therapy evaluation for s/p CVA. Pt received HH therapies for 5 months following CVA then stopped receiving services. Had improved mobility with HH therapies, however family reports regression following break with therapies. Pt requiring extensive assist with all ADL's and IADL's, including functional mobility at this time, due to hypertonicity, limited mobility, and weakness.   PERFORMANCE DEFICITS: in functional skills including ADLs, IADLs, coordination, proprioception, sensation, edema, tone, ROM, strength, pain, fascial restrictions, muscle spasms, Fine motor control, Gross motor control, mobility, balance, body mechanics, endurance, and UE functional use, cognitive skills including energy/drive, problem solving, safety awareness, and sequencing, and psychosocial skills including coping strategies and environmental adaptation.   IMPAIRMENTS: are limiting patient from ADLs, IADLs, rest and sleep, work, leisure, and social participation.   CO-MORBIDITIES: may have co-morbidities  that affects occupational performance. Patient will benefit from skilled OT to address above  impairments and improve overall function.  MODIFICATION OR ASSISTANCE TO COMPLETE EVALUATION: Min-Moderate modification of tasks or assist with assess necessary to complete an evaluation.  OT OCCUPATIONAL PROFILE AND HISTORY: Detailed assessment: Review of records and additional review of physical, cognitive, psychosocial history related to current functional performance.  CLINICAL DECISION MAKING: Moderate - several treatment options, min-mod task modification necessary  REHAB POTENTIAL: Good  EVALUATION COMPLEXITY: Moderate    PLAN:  OT FREQUENCY: 2x/week  OT DURATION: 4 weeks  PLANNED INTERVENTIONS: 97168 OT Re-evaluation, 97535 self care/ADL training, 81191 therapeutic exercise, 97530 therapeutic activity, 97112 neuromuscular re-education, 97140 manual therapy, 97035 ultrasound, 97010 moist heat, 97032 electrical stimulation (manual), 97760 Orthotics management and training, 47829 Splinting (initial encounter), [id number] Subsequent splinting/medication, passive range of motion, balance training, functional mobility training, energy conservation, coping strategies training, patient/family education, and DME and/or AE instructions  RECOMMENDED OTHER SERVICES: PT and Speech  CONSULTED AND AGREED WITH PLAN OF CARE: Patient  PLAN FOR NEXT SESSION: Weight bearing. Self stretching, progressive resting hand splint, sublux sling  Trish Mage, OTR/L [facility] [phone] [place], OT 03/21/2024, 9:10 PM   Managed Medicaid Authorization Request  Visit Dx Codes: R27.8, I69.351, R29.818  Functional Tool Score: Berneda Rose: 79.55  For all possible CPT codes, reference the Planned Interventions line above.     Check all conditions that are expected to impact treatment: {Conditions expected to impact treatment:Contractures, spasticity or fracture relevant to requested treatment   If treatment provided at initial evaluation, no treatment charged due to lack of  authorization.

## 2024-03-22 ENCOUNTER — Ambulatory Visit: Payer: Medicaid Other | Admitting: Family Medicine

## 2024-03-22 ENCOUNTER — Other Ambulatory Visit: Payer: Self-pay | Admitting: Physical Medicine & Rehabilitation

## 2024-03-26 ENCOUNTER — Encounter: Payer: Self-pay | Admitting: Family Medicine

## 2024-03-27 ENCOUNTER — Telehealth: Payer: Self-pay | Admitting: Family Medicine

## 2024-03-27 NOTE — Telephone Encounter (Signed)
 Copied from CRM [id number]. Topic: General - Other >> Mar 27, 2024  4:15 PM Fredrica W wrote: Reason for CRM: Adult Protective Services [facility] called states provider [place] sent in paperwork for CAP/DA services for patient but page 2 was not sent. Can resend just page 2 or all three pages again. Thank You

## 2024-03-27 NOTE — Telephone Encounter (Signed)
Forms were picked up. 

## 2024-03-28 NOTE — Telephone Encounter (Signed)
 We do not have any missing pages. If they state something is missing, can they refax the complete form to be completed again (or just the page they were missing

## 2024-03-29 NOTE — Telephone Encounter (Signed)
 No phone number to recall.

## 2024-04-01 ENCOUNTER — Telehealth (HOSPITAL_COMMUNITY): Payer: Self-pay | Admitting: Occupational Therapy

## 2024-04-01 ENCOUNTER — Encounter (HOSPITAL_COMMUNITY): Admitting: Occupational Therapy

## 2024-04-01 NOTE — Telephone Encounter (Signed)
 Called and spoke with sister, [name], regarding no show for today's appt. She states she forgot to call and would like to move his appts to later in the day. Transferred call to front office to work on schedule.   Ezra Sites, OTR/L  [phone] 04/01/24

## 2024-04-05 ENCOUNTER — Encounter: Attending: Registered Nurse | Admitting: Physical Medicine & Rehabilitation

## 2024-04-05 DIAGNOSIS — I69351 Hemiplegia and hemiparesis following cerebral infarction affecting right dominant side: Secondary | ICD-10-CM | POA: Insufficient documentation

## 2024-04-05 DIAGNOSIS — M62838 Other muscle spasm: Secondary | ICD-10-CM | POA: Insufficient documentation

## 2024-04-10 ENCOUNTER — Encounter (HOSPITAL_COMMUNITY): Admitting: Occupational Therapy

## 2024-04-12 ENCOUNTER — Encounter (HOSPITAL_COMMUNITY): Admitting: Occupational Therapy

## 2024-04-12 ENCOUNTER — Telehealth (HOSPITAL_COMMUNITY): Payer: Self-pay | Admitting: Occupational Therapy

## 2024-04-12 NOTE — Telephone Encounter (Signed)
 Spoke with pt's sister, Maxey Spangle, regarding pt's 2 no-shows this week. She reports they do not have transportation to get him to appts before 315 in the afternoon. OT explained staffing shortage due to a maternity leave, currently all late afternoon appts are full but offered to put on a weekly waitlist for openings.Shamica verbalized understanding.   Pt placed on waitlist through May 2025.   Lafonda Piety, OTR/L  [phone] 04/12/24

## 2024-04-16 ENCOUNTER — Encounter (HOSPITAL_COMMUNITY): Admitting: Occupational Therapy

## 2024-04-18 ENCOUNTER — Encounter (HOSPITAL_COMMUNITY): Admitting: Occupational Therapy

## 2024-04-22 ENCOUNTER — Encounter: Payer: Self-pay | Admitting: Neurology

## 2024-04-22 ENCOUNTER — Encounter: Payer: Self-pay | Admitting: Family Medicine

## 2024-04-22 ENCOUNTER — Ambulatory Visit (INDEPENDENT_AMBULATORY_CARE_PROVIDER_SITE_OTHER): Payer: Medicaid Other | Admitting: Neurology

## 2024-04-22 VITALS — BP 131/88 | HR 69 | Ht 72.0 in

## 2024-04-22 DIAGNOSIS — I69320 Aphasia following cerebral infarction: Secondary | ICD-10-CM

## 2024-04-22 DIAGNOSIS — G40009 Localization-related (focal) (partial) idiopathic epilepsy and epileptic syndromes with seizures of localized onset, not intractable, without status epilepticus: Secondary | ICD-10-CM | POA: Diagnosis not present

## 2024-04-22 DIAGNOSIS — I69359 Hemiplegia and hemiparesis following cerebral infarction affecting unspecified side: Secondary | ICD-10-CM

## 2024-04-22 NOTE — Patient Instructions (Signed)
 I had a long d/w patient and his sister about his partial seizures and remote stroke, residual aphasia and dense right hemiplegia risk for recurrent stroke/TIAs, personally independently reviewed imaging studies and stroke evaluation results and answered questions.I recommend he continue Keppra  and the current dose of 500 mg twice daily for seizure prophylaxis and avoid seizure provoking stimuli like sleep deprivation, medication noncompliance and stimulants like alcohol and street drugs.  Continue aspirin  81 mg daily but stop Plavix  now for secondary stroke prevention and maintain strict control of hypertension with blood pressure goal below 130/90, diabetes with hemoglobin A1c goal below 6.5% and lipids with LDL cholesterol goal below 70 mg/dL. I also advised the patient to eat a healthy diet with plenty of whole grains, cereals, fruits and vegetables, exercise regularly and maintain ideal body weight.  Check screening lipid profile, hemoglobin A1c, carotid ultrasound and transcranial Doppler studies.  Followup in the future with my nurse practitioner in 6 months or call earlier if necessary.

## 2024-04-22 NOTE — Progress Notes (Signed)
 [facility]  PATIENT: Zachary Winters DOB: 1981-06-02  REFERRING CLINICIAN: Zarwolo, Gloria, FNP HISTORY FROM: patient  REASON FOR VISIT: new consult   HISTORICAL  CHIEF COMPLAINT:  Chief Complaint  Patient presents with   Follow-up    Pt in room 17. Tamika sister in room.Here for stroke follow up. Right side weakness, pt lives at home doing OT rehab at home, but now needs to do outpatient rehab. Need PT ,but needs order,,    HISTORY OF PRESENT ILLNESS:  Update 04/22/2024 : Patient is seen for follow-up today after last visit in the office with Dr. Salli Crawley in October 2024.  He is accompanied by sister.  He presented on 02/05/2024 with a witnessed seizure in the morning witnessed by family member.  It lasted a few minutes and was followed by postictal confusion.  CT scan of the head as well as MRI scan showed no acute abnormalities.  CT angiogram showed no large vessel stenosis or occlusion.  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  There was new moderate irregularity and stenosis of the right PCA P1/P2 junction.  EEG showed continuous slowing in the left frontotemporal region.  Patient was started on Keppra .  He states he has done well since then is that no further recurrent seizures.  He is tolerating Keppra  well without any side effects.  Patient remains on aspirin  and Plavix  which he is tolerating well without bruising or bleeding.  He is tolerating Lipitor  well without muscle aches and pains.  He states his sugars under good control.  Last lipid profile on 02/06/2024 was showing LDL 76 mg percent.  He has not had any recurrent stroke or TIA symptoms. UPDATE (09/27/23, VRP): 43 year old male here for stroke hospital follow up. Since last visit, doing well. Now living with sister, who is primary caregiver. Language gradually improving. Right sided still very weak.  Tolerating meds.   PRIOR HPI (Dr. Janett Medin, 06/26/23): "43 y.o. male with history of diabetes, hypertension, hyperlipidemia and smoking who was initially admitted to [facility] on 6/19 with right hemiplegia and aphasia.  He presented outside the window for TNK and thrombectomy.  He was found to have a left basal ganglia infarct, which is now extended into much of the left MCA territory, and he also developed a small acute infarct in posterior limb of right internal capsule.  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  Yesterday, he was transferred here to be seen by stroke team and possibly for conventional angiogram given severe stenosis.  Admission urine toxicology was positive for THC.  Patient's grandmother states that he does not often go to the doctor and does  smoke cigarettes."  REVIEW OF SYSTEMS: Full 14 system review of systems performed and negative with exception of: as per HPI.  ALLERGIES: Allergies  Allergen Reactions   Chocolate     vomiting   Metformin  Nausea And Vomiting   Penicillins Swelling    HOME MEDICATIONS: Outpatient Medications Prior to Visit  Medication Sig Dispense Refill   amLODipine  (NORVASC ) 10 MG tablet TAKE 1 TABLET(10 MG) BY MOUTH DAILY 90 tablet 1   aspirin  EC 81 MG tablet Take 81 mg by mouth daily. Swallow whole.     atorvastatin  (LIPITOR ) 80 MG tablet TAKE 1 TABLET(80 MG) BY MOUTH DAILY AFTER SUPPER 90 tablet 1   baclofen  (LIORESAL ) 10 MG tablet TAKE 1 AND 1/2 TABLETS(15  MG) BY MOUTH THREE TIMES DAILY 135 tablet 1   blood glucose meter kit and supplies Dispense based on patient and insurance preference. Check blood sugar four times daily as directed. (FOR ICD-10 E10.9, E11.9). 1 each 0   carvedilol  (COREG ) 6.25 MG tablet TAKE 1 TABLET(6.25 MG) BY MOUTH TWICE DAILY WITH A MEAL 90 tablet 1   clopidogrel  (PLAVIX ) 75 MG tablet TAKE 1 TABLET(75 MG) BY MOUTH DAILY 60 tablet 2   ezetimibe  (ZETIA ) 10 MG tablet Take 1 tablet (10 mg total) by mouth daily. 90 tablet  3   glimepiride  (AMARYL ) 2 MG tablet TAKE 1 TABLET(2 MG) BY MOUTH DAILY WITH BREAKFAST 90 tablet 1   insulin  glargine (LANTUS ) 100 UNIT/ML Solostar Pen Inject 18 Units into the skin at bedtime. 15 mL 11   Insulin  Pen Needle (PEN NEEDLES) 32G X 4 MM MISC pen needle BD Nano Ultra Fine 32G x 4mm DX: e11.40 100 each 2   levETIRAcetam  (KEPPRA ) 500 MG tablet Take 1 tablet (500 mg total) by mouth 2 (two) times daily. 60 tablet 2   No facility-administered medications prior to visit.    PAST MEDICAL HISTORY: Past Medical History:  Diagnosis Date   Nausea and vomiting    chronic for years.   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 No pertinent family history.  SOCIAL HISTORY: Social History   Socioeconomic History   Marital status: Single    Spouse name: Not on file   Number of children: 1   Years of education: Not on file   Highest education level: Some college, no degree  Occupational History   Not on file  Tobacco Use   Smoking status: Former    Current packs/day: 0.25    Average packs/day: 0.3 packs/day for 20.0 years (5.0 ttl pk-yrs)    Types: Cigarettes   Smokeless tobacco: Never  Vaping Use   Vaping status: Never Used  Substance and Sexual Activity   Alcohol use: Not Currently   Drug use: Not Currently    Frequency: 2.0 times per week    Types: Marijuana   Sexual activity: Yes  Other Topics Concern   Not on file  Social History Narrative   Lives with parents    Has son 6 years-[name]      Enjoy: BB and son       Diet: everything    Caffeine: tea daily    Water : 3-4 bottles of 16 oz      Wear seat belt   Wear sun protection   Smoke detectors    Does not use phone while driving       Social Drivers of Corporate investment banker Strain: Low Risk  (10/31/2019)   Overall Financial Resource Strain (CARDIA)    Difficulty of Paying Living Expenses: Not hard at all  Food Insecurity: No Food Insecurity (02/05/2024)   Hunger Vital Sign    Worried About Running Out of Food in the Last Year: Never  true    Ran Out of Food in the Last Year: Never true  Transportation Needs: No Transportation Needs (02/05/2024)   PRAPARE - Administrator, Civil Service (Medical): No    Lack of Transportation (Non-Medical): No  Physical Activity: Sufficiently Active (10/31/2019)   Exercise Vital Sign    Days of Exercise per Week: 3 days    Minutes of Exercise per Session: 60 min  Stress: No Stress Concern Present (10/31/2019)   [employer] of Occupational Health - Occupational Stress Questionnaire    Feeling of Stress : Not at all  Social Connections: Socially Isolated (10/31/2019)   Social Connection and Isolation Panel [NHANES]    Frequency of Communication with Friends and Family: More than three times a week    Frequency of Social Gatherings with Friends and Family: More than three times a week    Attends Religious Services: Never    Database administrator or Organizations: No    Attends Banker Meetings: Never    Marital Status: Divorced  Catering manager Violence: Not At Risk (02/05/2024)   Humiliation, Afraid, Rape, and Kick questionnaire    Fear of Current or Ex-Partner: No    Emotionally Abused: No    Physically Abused: No    Sexually Abused: No     PHYSICAL EXAM  GENERAL EXAM/CONSTITUTIONAL: Vitals:  Vitals:   04/22/24 1501  BP: 131/88  Pulse: 69  Height: 6' (1.829 m)   Body mass index is 26.31 kg/m. Wt  Readings from Last 3 Encounters:  02/05/24 194 lb (88 kg)  09/27/23 172 lb (78 kg)  08/22/23 176 lb 2.4 oz (79.9 kg)   Patient is in no distress; well developed, nourished and groomed; neck is supple  CARDIOVASCULAR: Examination of carotid arteries is normal; no carotid bruits Regular rate and rhythm, no murmurs Examination of peripheral vascular system by observation and palpation is normal  EYES: Ophthalmoscopic exam of optic discs and posterior segments is normal; no papilledema or hemorrhages No results found.  MUSCULOSKELETAL: Gait, strength, tone, movements noted in Neurologic exam below  NEUROLOGIC: MENTAL STATUS:      No data to display         awake, alert, oriented to person Moderate expressive aphasia   Mild comprehension impairment.  Difficulty with naming and repetition.  CRANIAL NERVE:  2nd - no papilledema on fundoscopic exam 2nd, 3rd, 4th, 6th - pupils equal and reactive to light, left gaze preference but able to look all the way to the right extraocular muscles intact, no nystagmus dense right homonymous hemianopsia 5th - facial sensation symmetric 7th - facial strength --> DENSE RIGHT LOWER FACIAL WEAKNESS 8th - hearing intact 9th - palate elevates symmetrically, uvula midline 11th - shoulder shrug --> ABSENT ON RIGHT 12th - tongue protrusion --> DEVIATES TO RIGHT  MOTOR:  SPASTIC, DENSE RIGHT HEMIPLEGIA (0/5) right foot drop normal bulk and tone strength in the LUE, LLE  SENSORY:  DECR IN RIGHT ARM AND RIGHT LEG  COORDINATION:  finger-nose-finger, fine finger movements NORMAL ON LEFT  REFLEXES:  deep tendon reflexes --> SLIGHTLY BRISK IN RUE AND RLE  GAIT/STATION:  IN WHEEL CHAIR   NIH stroke scale 14 Modified Rankin score 4.  DIAGNOSTIC DATA (LABS, IMAGING, TESTING) - I reviewed patient records, labs, notes, testing and imaging myself where available.  Lab Results  Component Value Date   WBC 7.6 02/05/2024   HGB 10.7 (L)  02/05/2024   HCT 32.5 (L) 02/05/2024   MCV  89.8 02/05/2024   PLT 228 02/05/2024      Component Value Date/Time   NA 140 02/05/2024 0410   NA 144 11/21/2023 1113   K 4.2 02/05/2024 0410   CL 109 02/05/2024 0410   CO2 24 02/05/2024 0410   GLUCOSE 101 (H) 02/05/2024 0410   BUN 37 (H) 02/05/2024 0410   BUN 26 (H) 11/21/2023 1113   CREATININE 2.05 (H) 02/05/2024 0410   CREATININE 1.15 10/31/2019 0954   CALCIUM  8.9 02/05/2024 0410   PROT 7.4 02/05/2024 0410   PROT 7.5 11/21/2023 1113   ALBUMIN 3.6 02/05/2024 0410   ALBUMIN 4.0 (L) 11/21/2023 1113   AST 40 02/05/2024 0410   ALT 74 (H) 02/05/2024 0410   ALKPHOS 99 02/05/2024 0410   BILITOT 0.3 02/05/2024 0410   BILITOT 0.3 11/21/2023 1113   GFRNONAA 41 (L) 02/05/2024 0410   GFRNONAA 81 10/31/2019 0954   GFRAA 94 10/31/2019 0954   Lab Results  Component Value Date   CHOL 121 02/06/2024   HDL 35 (L) 02/06/2024   LDLCALC 76 02/06/2024   TRIG 52 02/06/2024   CHOLHDL 3.5 02/06/2024   Lab Results  Component Value Date   HGBA1C 5.6 11/21/2023   No results found for: "VITAMINB12" Lab Results  Component Value Date   TSH 1.720 11/21/2023      ASSESSMENT AND PLAN  43 y.o. 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 New onset focal seizures in February 2025 likely symptomatic epilepsy   Dx:  1. Aphasia as late effect of cerebrovascular accident   2. Localization-related idiopathic epilepsy and epileptic syndromes with seizures of localized onset, not intractable, without status epilepticus (HCC)   3. 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  Continue aspirin  81 mg daily but stop Plavix  now for secondary stroke prevention and maintain strict control of hypertension with blood pressure goal below 130/90, diabetes with hemoglobin A1c goal below 6.5% and lipids with LDL cholesterol goal below 70 mg/dL. I also advised the patient to eat a healthy diet with plenty of whole grains, cereals, fruits and vegetables, exercise regularly and maintain ideal body weight.  Check screening lipid profile, hemoglobin A1c, carotid ultrasound and transcranial Doppler studies.  Followup in the future with: nurse practitioner in 6 months or call earlier if necessary. Greater than 50% time during this 40-minute visit was spent in counseling and coordination of care about his new onset seizures as well as remote stroke and spastic hemiplegia and discussion with patient and sister and answering questions.   Ardella Beaver, , MD 04/22/2024, 3:44 PM Certified in Neurology, Neurophysiology and Neuroimaging  [facility] [address] [place], [place] [address] [phone]

## 2024-04-23 ENCOUNTER — Encounter (HOSPITAL_COMMUNITY): Admitting: Occupational Therapy

## 2024-04-23 ENCOUNTER — Other Ambulatory Visit: Payer: Self-pay | Admitting: Neurology

## 2024-04-23 LAB — LIPID PANEL
Chol/HDL Ratio: 3.3 ratio (ref 0.0–5.0)
Cholesterol, Total: 156 mg/dL (ref 100–199)
HDL: 47 mg/dL (ref >39)
LDL Chol Calc (NIH): 96 mg/dL (ref 0–99)
Triglycerides: 65 mg/dL (ref 0–149)
VLDL Cholesterol Cal: 13 mg/dL (ref 5–40)

## 2024-04-23 LAB — HEMOGLOBIN A1C
Est. average glucose Bld gHb Est-mCnc: 131 mg/dL
Hgb A1c MFr Bld: 6.2 % — ABNORMAL HIGH (ref 4.8–5.6)

## 2024-04-23 MED ORDER — EZETIMIBE 10 MG PO TABS: 10.0000 mg | ORAL_TABLET | Freq: Every day | ORAL | 3 refills | Status: AC

## 2024-04-23 NOTE — Progress Notes (Signed)
 Kindly inform the patient that cholesterol profile shows bad cholesterol is still slightly high despite maximum dose of atorvastatin .  I recommend adding Zetia  10 mg daily and I will send in a prescription.  Repeat cholesterol profile in 2 months and if still not satisfactory may need to consider Repatha or Praluent injections. Screening test for diabetes suggest satisfactory control

## 2024-04-25 ENCOUNTER — Encounter (HOSPITAL_COMMUNITY): Admitting: Occupational Therapy

## 2024-04-26 NOTE — Telephone Encounter (Signed)
 Please ask the patient to fax the forms that need to be completed. Kindly place a referral to podiatry for further evaluation and management.

## 2024-04-29 ENCOUNTER — Encounter: Payer: Self-pay | Admitting: Family Medicine

## 2024-04-30 ENCOUNTER — Encounter (HOSPITAL_COMMUNITY): Admitting: Occupational Therapy

## 2024-05-01 ENCOUNTER — Other Ambulatory Visit: Payer: Self-pay | Admitting: Family Medicine

## 2024-05-01 ENCOUNTER — Telehealth: Payer: Self-pay | Admitting: Family Medicine

## 2024-05-01 ENCOUNTER — Other Ambulatory Visit: Payer: Self-pay

## 2024-05-01 DIAGNOSIS — E1349 Other specified diabetes mellitus with other diabetic neurological complication: Secondary | ICD-10-CM

## 2024-05-01 DIAGNOSIS — E1165 Type 2 diabetes mellitus with hyperglycemia: Secondary | ICD-10-CM

## 2024-05-01 NOTE — Telephone Encounter (Signed)
 Orders placed. Forms should be w/ your signature form folder.

## 2024-05-01 NOTE — Telephone Encounter (Signed)
 Copied from CRM [id number]. Topic: General - Other >> Apr 30, 2024  4:24 PM Everette C wrote: Reason for CRM: The patient's sister has called for an update on previously submitted documents for patient transportation  The Physician LOC worksheet from [employer] needs completion and submission if/when possible   Please fax the completed sheet to [employer] [phone] [place] o/b/o [place] [employer]   Please contact the patient's family member further to confirm completion when possible

## 2024-05-01 NOTE — Telephone Encounter (Signed)
 I found the forms scanned in from march and they had all the pages - so I re-faxed the forms to the requested number

## 2024-05-01 NOTE — Telephone Encounter (Signed)
 Please see message about transportation forms needing completion. (I have already entered the podiatry referral for diabetic foot care)

## 2024-05-01 NOTE — Telephone Encounter (Signed)
 Patient notified by mychart responded to their message

## 2024-05-01 NOTE — Telephone Encounter (Signed)
 Forms are with provider awaiting completion.

## 2024-05-02 ENCOUNTER — Encounter (HOSPITAL_COMMUNITY): Payer: Self-pay | Admitting: Occupational Therapy

## 2024-05-02 ENCOUNTER — Ambulatory Visit (HOSPITAL_COMMUNITY): Attending: Family Medicine | Admitting: Occupational Therapy

## 2024-05-02 DIAGNOSIS — R278 Other lack of coordination: Secondary | ICD-10-CM | POA: Diagnosis present

## 2024-05-02 DIAGNOSIS — R29818 Other symptoms and signs involving the nervous system: Secondary | ICD-10-CM | POA: Insufficient documentation

## 2024-05-02 DIAGNOSIS — I69351 Hemiplegia and hemiparesis following cerebral infarction affecting right dominant side: Secondary | ICD-10-CM | POA: Insufficient documentation

## 2024-05-02 NOTE — Telephone Encounter (Signed)
 I don't see the forms in my folder

## 2024-05-02 NOTE — Therapy (Signed)
 OUTPATIENT OCCUPATIONAL THERAPY NEURO TREATMENT REASSESSMENT/RECERIFICATION  Patient Name: CANYON LANGSTON MRN: [id number] DOB:March 02, 1981, 43 y.o., male Today's Date: 05/02/2024  PCP: Zarwolo, Gloria, FNP REFERRING PROVIDER: Zarwolo, Gloria, FNP  END OF SESSION:  OT End of Session - 05/02/24 1943     Visit Number 2    Number of Visits 8    Date for OT Re-Evaluation 06/01/24    Authorization Type [employer]    Authorization Time Period 7 visits 03/21/24-05/19/24    Authorization - Visit Number 1    Authorization - Number of Visits 7    OT Start Time 1531   pt arrived late   OT Stop Time 1612    OT Time Calculation (min) 41 min    Activity Tolerance Patient tolerated treatment well    Behavior During Therapy WFL for tasks assessed/performed              Past Medical History:  Diagnosis Date   Nausea and vomiting    chronic for years.   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)    decreased vision--has been getting injections   Type 1 diabetes mellitus (HCC)    Diagnosed at age 89 (has been on insulin  every since dx)   Past Surgical History:  Procedure Laterality Date   TEE WITHOUT CARDIOVERSION N/A 06/16/2023   Procedure: TRANSESOPHAGEAL ECHOCARDIOGRAM (TEE);  Surgeon: Gerard Knight, MD;  Location: [facility];  Service: Cardiovascular;  Laterality: N/A;   Patient Active Problem List   Diagnosis Date Noted   CVA (cerebral vascular accident) (HCC) 02/05/2024   H/O nausea and vomiting 07/07/2023   Stroke (HCC) 06/27/2023   Apraxia 06/19/2023   Vitamin D  deficiency 06/15/2023   Acute stroke due to ischemia (HCC) 06/14/2023   Dysphonia 06/14/2023   Aphasia 06/14/2023   Tobacco use disorder 06/14/2023   Recreational drug use 06/14/2023   Diabetic neuropathy (HCC) 06/14/2023   Dehydration    Lactic acidosis 03/14/2021   SIRS (systemic inflammatory response syndrome) (HCC) 03/14/2021    Abdominal pain with vomiting 03/14/2021   Somnolence 03/14/2021   Intractable nausea and vomiting    Type 2 diabetes mellitus with hyperglycemia (HCC) 01/01/2020   Hyperlipidemia associated with type 2 diabetes mellitus (HCC) 01/01/2020   Hypertension associated with diabetes (HCC) 10/31/2019   Essential hypertension 10/31/2019    ONSET DATE: 06/10/23  REFERRING DIAG:  I63.9 (ICD-10-CM) - Acute stroke due to ischemia (HCC)  I63.9 (ICD-10-CM) - Cerebrovascular accident (CVA), unspecified mechanism (HCC)    THERAPY DIAG:  Other lack of coordination  Hemiplegia and hemiparesis following cerebral infarction affecting right dominant side (HCC)  Other symptoms and signs involving the nervous system  Rationale for Evaluation and Treatment: Rehabilitation  SUBJECTIVE:   SUBJECTIVE STATEMENT: "This side is real tough." Pt accompanied by: self and family member  PERTINENT HISTORY: medical history significant for CVA with right-sided hemiplegia-wheelchair-bound, hypertension, dyslipidemia, and type 2 diabetes.  PRECAUTIONS: Fall  WEIGHT BEARING RESTRICTIONS: No  PAIN:  Are you having pain? Yes: NPRS scale: 9/10 Pain location: R side of body Pain description: Tingling and sore Aggravating factors: anything Relieving factors: unsure  FALLS: Has patient fallen in last 6 months? No  LIVING ENVIRONMENT: Lives with: lives with their family Lives in: House/apartment Stairs: No Has following equipment at home: Otho Blitz - 2 wheeled, Wheelchair (manual), shower chair, bed side commode, and Grab bars  PLOF: Needs assistance with ADLs and Needs  assistance with transfers  PATIENT GOALS: To improve independence  OBJECTIVE:  Note: Objective measures were completed at Evaluation unless otherwise noted.  HAND DOMINANCE: Right  ADLs: Overall ADLs: Pt has RUE hemiplegia with limited active movement and severe tone. Pt requires mod to max assist with all ADL's, including but not limited  to dressing, grooming, and bathing. He also requires mod to max assist for all transfers.  MOBILITY STATUS: Needs Assist: mod to max assist, mainly WC bound with stand pivot transfers-max assist from caregiver. Pt does not use hemi-walker or RW to assist.   POSTURE COMMENTS:  rounded shoulders and forward head Sitting balance: Sits without support for 30 sec  ACTIVITY TOLERANCE: Activity tolerance: pt reports increased fatigue.  FUNCTIONAL OUTCOME MEASURES: Quick Dash: 79.55  UPPER EXTREMITY ROM:    Pt has hypertonicity with limited tolerance for passive stretching - all motions passively range less than 30% of full motion.  UPPER EXTREMITY MMT:     RUE is 0/5 throughout. Pt with no trapezius activation, no voluntary or involuntary active motion noted.   HAND FUNCTION: 05/02/24-Pt with high tone, spasticity limiting function. Pt with mild to moderate contractures at the PIP joints, hand positioned in 75% fist. Able to tolerate passive stretching to ~60% fist.   COORDINATION: unable  SENSATION: Limited sensation on entire R side  EDEMA: Moderate edema noted in RUE from elbow to fingers  MUSCLE TONE: RUE: Severe and Hypertonic  COGNITION: Overall cognitive status: Impaired  VISION: Subjective report: "no change" Baseline vision: No visual deficits Visual history: Reports no vision difficulties  VISION ASSESSMENT: Not tested  Patient has difficulty with following activities due to following visual impairments: None  OBSERVATIONS: Pt has moderate expressive aphasia.  05/02/24: 2 finger subluxation at right shoulder.                                                                                                                              TREATMENT DATE:  05/02/24:  -Pt arrives with his niece who is his full time caregiver. Pt has not been seen since his evaluation on 03/20/24 and reports has not completed his HEP. Pt has no active movement of the RUE including the  trapezius, and reports high pain with any passive movement. Lengthy discussion on goals of care with pt and his niece. Pt is currently wheelchair and bed-bound. Niece reports pt does not get out of bed unless he has an appointment. He uses depends for toileting. Niece feels pt is afraid of bed mobility tasks. He does have a hospital bed with rails. For transfers, niece's father performs a total assist transfer to the wheelchair using the gait belt, niece reports pt does not attempt to assist. Pt has a hemi-walker but only used with [facility], was instructed not to use without PT there and [facility] services ended in November 2024.  -Discussed progression of stroke recovery and realistic expectations considering the severity of deficits pt is experiencing  and the length of time since the stroke, almost 1 year. Pt emotional, asking when his arm would improve and be able to move. OT expressing understanding at the difficulty of the situation and outlook, explaining at this time we would transition to looking at adaptive strategies for task completion versus improving active RUE mobility.  -Niece verbalizing understanding of the education. OT recommending a PT referral to focus on bed mobility and transfers, to improve pt's safety and participation in the tasks and reduce caregiver burden. Considering current level of care provided at home and limited expected progress, recommended 2 OT sessions for splint fitting and providing HEP/AE instructions then transitioning to PT accounting for pt's visit limit set by insurance. Pt and niece agreeable to plan.    PATIENT EDUCATION: Education details: towel slides in flexion, self-stretching for horizontal abduction. Recommend looking into condom catheter to reduce frequency of changing depends Person educated: Patient Education method: Explanation, Demonstration, and Handouts Education comprehension: verbalized understanding and returned demonstration  HOME EXERCISE  PROGRAM: 3/26: Self Stretches 5/8: towel slides in flexion, self-stretching for horizontal abduction. Recommend looking into condom catheter to reduce frequency of changing depends   GOALS: Goals reviewed with patient? Yes  SHORT TERM GOALS: Target date: 04/26/24    Pt and caregiver will be provided with and educated on HEP to improve mobility of RUE and independence in self-care tasks.    Goal status: IN PROGRESS   2.   Pt and caregiver will be educated on weightbearing techniques to reduce pain, subluxation, and tone and improve tolerance to ROM tasks.    Goal status: IN PROGRESS   3.  Pt will improve RUE strength by at least 1 muscle grade to improve ability to actively participate in self-care tasks using RUE as stabilization or gross assist.    Goal status: DEFERRED   4.   Pt will improve ability to make a gross grasp to approximately 25% or greater, to improve ability to use right hand as gross assist when stabilizing items.    Goal status: DEFERRED   5.  Pt will decrease pain in RUE to 4/10 or less as noted by verbalizing decreased pain and minimal facial grimacing during session activities.    Goal status: IN PROGRESS   6.  Pt will increase A/ROM to 25% in shoulder, elbow, and wrist, to improve mobility required for participation in ADL tasks.   Goal Status: DEFERRED   ASSESSMENT:  CLINICAL IMPRESSION: Pt presenting for first session since initial evaluation on 03/20/24. Pt has not completed his HEP and continues to require max to total assist from family for ADL completion. Pt is primarily bed bound at home, only transfers to wheelchair if he has an appointment. Extensive discussion regarding current functioning and goals of care, considering severity of deficits and length of time since the CVA which is almost 1 year. See above for details. Recommend static progressive splint to prevent further contracture of right hand and additional session for HEP and AE education.  Referral to PT requested from MD as pt and family would benefit from PT for education on managing bed mobility and transfers safely.   PERFORMANCE DEFICITS: in functional skills including ADLs, IADLs, coordination, proprioception, sensation, edema, tone, ROM, strength, pain, fascial restrictions, muscle spasms, Fine motor control, Gross motor control, mobility, balance, body mechanics, endurance, and UE functional use, cognitive skills including energy/drive, problem solving, safety awareness, and sequencing, and psychosocial skills including coping strategies and environmental adaptation.   IMPAIRMENTS: are limiting patient  from ADLs, IADLs, rest and sleep, work, leisure, and social participation.   CO-MORBIDITIES: may have co-morbidities  that affects occupational performance. Patient will benefit from skilled OT to address above impairments and improve overall function.  MODIFICATION OR ASSISTANCE TO COMPLETE EVALUATION: Min-Moderate modification of tasks or assist with assess necessary to complete an evaluation.  OT OCCUPATIONAL PROFILE AND HISTORY: Detailed assessment: Review of records and additional review of physical, cognitive, psychosocial history related to current functional performance.  CLINICAL DECISION MAKING: Moderate - several treatment options, min-mod task modification necessary  REHAB POTENTIAL: Poor length of time since CVA, lack of mobility  EVALUATION COMPLEXITY: Moderate    PLAN:  OT FREQUENCY: 1x/week  OT DURATION: 2 sessions  PLANNED INTERVENTIONS: 97168 OT Re-evaluation, 97535 self care/ADL training, 16109 therapeutic exercise, 97530 therapeutic activity, 97112 neuromuscular re-education, 97140 manual therapy, 97035 ultrasound, 97010 moist heat, 97032 electrical stimulation (manual), 97760 Orthotics management and training, 60454 Splinting (initial encounter), [id number] Subsequent splinting/medication, passive range of motion, balance training, functional mobility  training, energy conservation, coping strategies training, patient/family education, and DME and/or AE instructions  RECOMMENDED OTHER SERVICES: PT and Speech  CONSULTED AND AGREED WITH PLAN OF CARE: Patient  PLAN FOR NEXT SESSION: Weight bearing. Self stretching, progressive resting hand splint, possible k-tape for subluxation    Lafonda Piety, OTR/L  [phone] 05/02/2024, 7:46 PM

## 2024-05-02 NOTE — Patient Instructions (Signed)
 1) SHOULDER: Flexion On Table   Place hands on towel placed on table, elbows straight. Lean forward with you upper body, pushing towel away from body.  10 reps per set, 2 sets per day    Recommendations:   -Research condom catheters for men. This will assist with urination and reduce the number of daily depends changes.  -

## 2024-05-03 ENCOUNTER — Ambulatory Visit (HOSPITAL_BASED_OUTPATIENT_CLINIC_OR_DEPARTMENT_OTHER)
Admission: RE | Admit: 2024-05-03 | Discharge: 2024-05-03 | Disposition: A | Discharge status: Home / Self Care | Source: Ambulatory Visit | Attending: Neurology | Admitting: Neurology

## 2024-05-03 ENCOUNTER — Ambulatory Visit (HOSPITAL_COMMUNITY)
Admission: RE | Admit: 2024-05-03 | Discharge: 2024-05-03 | Disposition: A | Discharge status: Home / Self Care | Source: Ambulatory Visit | Attending: Neurology | Admitting: Neurology

## 2024-05-03 DIAGNOSIS — I6529 Occlusion and stenosis of unspecified carotid artery: Secondary | ICD-10-CM | POA: Diagnosis not present

## 2024-05-03 DIAGNOSIS — I639 Cerebral infarction, unspecified: Secondary | ICD-10-CM

## 2024-05-03 DIAGNOSIS — I69359 Hemiplegia and hemiparesis following cerebral infarction affecting unspecified side: Secondary | ICD-10-CM | POA: Diagnosis present

## 2024-05-06 ENCOUNTER — Encounter: Payer: Self-pay | Admitting: Family Medicine

## 2024-05-08 ENCOUNTER — Encounter (HOSPITAL_COMMUNITY): Admitting: Occupational Therapy

## 2024-05-09 ENCOUNTER — Encounter (HOSPITAL_COMMUNITY): Admitting: Occupational Therapy

## 2024-05-09 ENCOUNTER — Other Ambulatory Visit: Payer: Self-pay

## 2024-05-09 ENCOUNTER — Other Ambulatory Visit: Payer: Self-pay | Admitting: Family Medicine

## 2024-05-09 MED ORDER — LEVETIRACETAM 500 MG PO TABS: 500.0000 mg | ORAL_TABLET | Freq: Two times a day (BID) | ORAL | 1 refills | Status: DC

## 2024-05-10 NOTE — Telephone Encounter (Signed)
 Form currently with provider. Be advised PCP is currently out of office 5/9-5/16. Paperwork will be faxed to [phone] upon completion.

## 2024-05-11 ENCOUNTER — Ambulatory Visit: Payer: Self-pay | Admitting: Neurology

## 2024-05-11 NOTE — Progress Notes (Signed)
Kindly inform the patient that carotid ultrasound study shows no significant narrowing of either carotid artery in the neck

## 2024-05-11 NOTE — Progress Notes (Signed)
 Kindly inform the patient that transcranial Doppler study shows mild narrowing of blood vessels close the front on either side with normal velocities in remaining blood vessels in the brain

## 2024-05-14 NOTE — Telephone Encounter (Signed)
 Called the pt to review the testing results. There was no answer. LVM for the pt to call back or review mychart message which will be sent also.

## 2024-05-15 ENCOUNTER — Encounter (HOSPITAL_COMMUNITY): Admitting: Occupational Therapy

## 2024-05-16 ENCOUNTER — Telehealth: Payer: Self-pay | Admitting: Family Medicine

## 2024-05-16 ENCOUNTER — Encounter (HOSPITAL_COMMUNITY): Admitting: Occupational Therapy

## 2024-05-16 NOTE — Telephone Encounter (Signed)
 Placard  Noted Copied Sleeved Original in PCP box Copy front desk folder  Call patient at [phone] when ready for pick up.

## 2024-05-17 NOTE — Telephone Encounter (Signed)
 Form has been signed and ready for pick up, left detailed vm , copy has been made sent to scan, originals at the front for pick up.

## 2024-05-20 NOTE — Progress Notes (Signed)
 Kindly inform patient that TCD study shows mild narrowing of large blood vessels in front of brain on both sides

## 2024-05-21 ENCOUNTER — Encounter (HOSPITAL_COMMUNITY): Admitting: Occupational Therapy

## 2024-05-22 ENCOUNTER — Other Ambulatory Visit: Payer: Self-pay | Admitting: Family Medicine

## 2024-05-22 ENCOUNTER — Encounter (HOSPITAL_COMMUNITY): Admitting: Occupational Therapy

## 2024-05-22 DIAGNOSIS — I10 Essential (primary) hypertension: Secondary | ICD-10-CM

## 2024-05-27 ENCOUNTER — Telehealth: Payer: Self-pay | Admitting: Family Medicine

## 2024-05-27 NOTE — Telephone Encounter (Signed)
 Needs placard filled out must be in Zachary Winters name  Copied Noted Sleeved   Original placed in provider box.  Copy in folder at front desk  Call [phone] when ready for pick up.

## 2024-05-27 NOTE — Telephone Encounter (Signed)
 Placard in [place] folder to sign

## 2024-05-28 NOTE — Telephone Encounter (Signed)
 Ready to be collected and pt notified by vm

## 2024-05-30 ENCOUNTER — Other Ambulatory Visit: Payer: Self-pay | Admitting: Physical Medicine & Rehabilitation

## 2024-06-10 ENCOUNTER — Ambulatory Visit: Payer: Self-pay | Admitting: Family Medicine

## 2024-06-10 ENCOUNTER — Encounter: Payer: Self-pay | Admitting: Family Medicine

## 2024-06-10 VITALS — BP 135/83 | HR 88 | Resp 16

## 2024-06-10 DIAGNOSIS — I10 Essential (primary) hypertension: Secondary | ICD-10-CM | POA: Diagnosis not present

## 2024-06-10 DIAGNOSIS — E1165 Type 2 diabetes mellitus with hyperglycemia: Secondary | ICD-10-CM | POA: Diagnosis not present

## 2024-06-10 DIAGNOSIS — E1169 Type 2 diabetes mellitus with other specified complication: Secondary | ICD-10-CM | POA: Diagnosis not present

## 2024-06-10 DIAGNOSIS — R7301 Impaired fasting glucose: Secondary | ICD-10-CM

## 2024-06-10 DIAGNOSIS — Z794 Long term (current) use of insulin: Secondary | ICD-10-CM

## 2024-06-10 DIAGNOSIS — E559 Vitamin D deficiency, unspecified: Secondary | ICD-10-CM | POA: Diagnosis not present

## 2024-06-10 DIAGNOSIS — E7849 Other hyperlipidemia: Secondary | ICD-10-CM

## 2024-06-10 DIAGNOSIS — E785 Hyperlipidemia, unspecified: Secondary | ICD-10-CM

## 2024-06-10 DIAGNOSIS — E038 Other specified hypothyroidism: Secondary | ICD-10-CM

## 2024-06-10 NOTE — Assessment & Plan Note (Signed)
 Stable on atorvastatin  80 mg daily and ezetimibe  10 mg daily Encouraged to decrease his intake of greasy, fatty, starchy foods and increase physical activity

## 2024-06-10 NOTE — Progress Notes (Signed)
 Established Patient Office Visit  Subjective:  Patient ID: JABRE HEO, male    DOB: 25-Feb-1981  Age: 43 y.o. MRN: [id number]  CC:  Chief Complaint  Patient presents with   Hypertension    Follow up visit     HPI Zachary Winters is a 43 y.o. male with past medical history of essential hypertension, type II diabetes,stroke presents for f/u of  chronic medical conditions.  For the details of today's visit, please refer to the assessment and plan.     Past Medical History:  Diagnosis Date   Nausea and vomiting    chronic for years.   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  Social History   Socioeconomic History   Marital status: Single    Spouse name: Not on file   Number of children: 1   Years of education: Not on file   Highest education level: Some college, no degree  Occupational History   Not on file  Tobacco Use   Smoking status: Former    Current packs/day: 0.25    Average packs/day: 0.3 packs/day for 20.0 years (5.0 ttl pk-yrs)    Types: Cigarettes   Smokeless tobacco: Never  Vaping Use   Vaping status: Never Used  Substance and Sexual Activity   Alcohol use: Not Currently   Drug use: Not Currently    Frequency: 2.0 times per week    Types: Marijuana   Sexual activity: Yes  Other Topics Concern   Not on file  Social History Narrative   Lives with parents   Has son 6 years-[name]      Enjoy: BB and son       Diet: everything    Caffeine: tea daily    Water : 3-4  bottles of 16 oz      Wear seat belt   Wear sun protection   Smoke detectors    Does not use phone while driving       Social Drivers of Corporate investment banker Strain: Low Risk  (10/31/2019)   Overall Financial Resource Strain (CARDIA)    Difficulty of Paying Living Expenses: Not hard at all  Food Insecurity: No Food Insecurity (02/05/2024)   Hunger Vital Sign    Worried About Running Out of Food in the Last Year: Never true    Ran Out of Food in the Last Year: Never true  Transportation Needs: No Transportation Needs (02/05/2024)   PRAPARE - Administrator, Civil Service (Medical): No    Lack of Transportation (Non-Medical): No  Physical Activity: Sufficiently Active (10/31/2019)   Exercise Vital Sign    Days of Exercise per Week: 3 days    Minutes of Exercise per Session: 60 min  Stress: No Stress Concern Present (10/31/2019)   [employer] of Occupational Health - Occupational Stress Questionnaire    Feeling of Stress : Not at all  Social Connections: Socially Isolated (10/31/2019)   Social Connection and Isolation Panel  Frequency of Communication with Friends and Family: More than three times a week    Frequency of Social Gatherings with Friends and Family: More than three times a week    Attends Religious Services: Never    Database administrator or Organizations: No    Attends Banker Meetings: Never    Marital Status: Divorced  Catering manager Violence: Not At Risk (02/05/2024)   Humiliation, Afraid, Rape, and Kick questionnaire    Fear of Current or Ex-Partner: No    Emotionally Abused: No    Physically Abused: No    Sexually Abused: No    Outpatient Medications Prior to Visit  Medication Sig Dispense Refill   amLODipine  (NORVASC ) 10 MG tablet TAKE 1 TABLET(10 MG) BY MOUTH DAILY 90 tablet 1   aspirin  EC 81 MG tablet Take 81 mg by mouth daily. Swallow whole.     atorvastatin  (LIPITOR ) 80 MG tablet TAKE 1 TABLET(80 MG) BY MOUTH  DAILY AFTER SUPPER 90 tablet 1   baclofen  (LIORESAL ) 10 MG tablet TAKE 1 AND 1/2 TABLETS(15 MG) BY MOUTH THREE TIMES DAILY 135 tablet 1   blood glucose meter kit and supplies Dispense based on patient and insurance preference. Check blood sugar four times daily as directed. (FOR ICD-10 E10.9, E11.9). 1 each 0   carvedilol  (COREG ) 6.25 MG tablet TAKE 1 TABLET(6.25 MG) BY MOUTH TWICE DAILY WITH A MEAL 90 tablet 1   clopidogrel  (PLAVIX ) 75 MG tablet TAKE 1 TABLET(75 MG) BY MOUTH DAILY 60 tablet 2   ezetimibe  (ZETIA ) 10 MG tablet Take 1 tablet (10 mg total) by mouth daily. 90 tablet 3   ezetimibe  (ZETIA ) 10 MG tablet Take 1 tablet (10 mg total) by mouth daily. 30 tablet 3   glimepiride  (AMARYL ) 2 MG tablet TAKE 1 TABLET(2 MG) BY MOUTH DAILY WITH BREAKFAST 90 tablet 1   insulin  glargine (LANTUS ) 100 UNIT/ML Solostar Pen Inject 18 Units into the skin at bedtime. 15 mL 11   Insulin  Pen Needle (PEN NEEDLES) 32G X 4 MM MISC pen needle BD Nano Ultra Fine 32G x 4mm DX: e11.40 100 each 2   levETIRAcetam  (KEPPRA ) 500 MG tablet TAKE 1 TABLET(500 MG) BY MOUTH TWICE DAILY 180 tablet 0   No facility-administered medications prior to visit.    Allergies  Allergen Reactions   Chocolate     vomiting   Metformin  Nausea And Vomiting   Penicillins Swelling    ROS Review of Systems  Constitutional:  Negative for fatigue and fever.  Eyes:  Negative for visual disturbance.  Respiratory:  Negative for chest tightness and shortness of breath.   Cardiovascular:  Negative for chest pain and palpitations.  Neurological:  Negative for dizziness and headaches.      Objective:    Physical Exam HENT:     Head: Normocephalic.     Right Ear: External ear normal.     Left Ear: External ear normal.     Nose: No congestion or rhinorrhea.     Mouth/Throat:     Mouth: Mucous membranes are moist.   Cardiovascular:     Rate and Rhythm: Regular rhythm.     Heart sounds: No murmur heard. Pulmonary:     Effort: No  respiratory distress.     Breath sounds: Normal breath sounds.   Neurological:     Mental Status: He is alert.     BP 135/83   Pulse 88   Resp 16   SpO2 95%  Wt Readings from Last  3 Encounters:  02/05/24 194 lb (88 kg)  09/27/23 172 lb (78 kg)  08/22/23 176 lb 2.4 oz (79.9 kg)    Lab Results  Component Value Date   TSH 1.720 11/21/2023   Lab Results  Component Value Date   WBC 7.6 02/05/2024   HGB 10.7 (L) 02/05/2024   HCT 32.5 (L) 02/05/2024   MCV 89.8 02/05/2024   PLT 228 02/05/2024   Lab Results  Component Value Date   NA 140 02/05/2024   K 4.2 02/05/2024   CO2 24 02/05/2024   GLUCOSE 101 (H) 02/05/2024   BUN 37 (H) 02/05/2024   CREATININE 2.05 (H) 02/05/2024   BILITOT 0.3 02/05/2024   ALKPHOS 99 02/05/2024   AST 40 02/05/2024   ALT 74 (H) 02/05/2024   PROT 7.4 02/05/2024   ALBUMIN 3.6 02/05/2024   CALCIUM  8.9 02/05/2024   ANIONGAP 7 02/05/2024   EGFR 57 (L) 11/21/2023   Lab Results  Component Value Date   CHOL 156 04/22/2024   Lab Results  Component Value Date   HDL 47 04/22/2024   Lab Results  Component Value Date   LDLCALC 96 04/22/2024   Lab Results  Component Value Date   TRIG 65 04/22/2024   Lab Results  Component Value Date   CHOLHDL 3.3 04/22/2024   Lab Results  Component Value Date   HGBA1C 6.2 (H) 04/22/2024      Assessment & Plan:  Essential hypertension Assessment & Plan: Controlled Encouraged to continue take amlodipine  10 mg daily and carvedilol  6.25 mg twice daily Currently asymptomatic in the clinic Low-sodium diet with increased physical activity encouraged BP Readings from Last 3 Encounters:  06/10/24 135/83  04/22/24 131/88  03/08/24 129/84      Hyperlipidemia associated with type 2 diabetes mellitus (HCC) Assessment & Plan: Stable on atorvastatin  80 mg daily and ezetimibe  10 mg daily Encouraged to decrease his intake of greasy, fatty, starchy foods and increase physical activity    Type 2 diabetes  mellitus with hyperglycemia, with long-term current use of insulin  (HCC) Assessment & Plan: The patient is on Lantus  18 units at bedtime and glimepiride  2 mg daily No reports of polyuria, polyphagia, and polydipsia Encouraged to decrease his intake of high sugar foods and beverages with increase physical activity Pending hemoglobin A1c  Orders: -     Microalbumin / creatinine urine ratio -     Hemoglobin A1c  IFG (impaired fasting glucose) -     Hemoglobin A1c  Vitamin D  deficiency -     VITAMIN D  25 Hydroxy (Vit-D Deficiency, Fractures)  TSH (thyroid -stimulating hormone deficiency) -     TSH + free T4  Other hyperlipidemia -     Lipid panel -     CMP14+EGFR -     CBC with Differential/Platelet  Note: This chart has been completed using Engineer, civil (consulting) software, and while attempts have been made to ensure accuracy, certain words and phrases may not be transcribed as intended.    Follow-up: Return in about 4 months (around 10/10/2024).   Derrell Milanes, FNP

## 2024-06-10 NOTE — Assessment & Plan Note (Signed)
 Controlled Encouraged to continue take amlodipine  10 mg daily and carvedilol  6.25 mg twice daily Currently asymptomatic in the clinic Low-sodium diet with increased physical activity encouraged BP Readings from Last 3 Encounters:  06/10/24 135/83  04/22/24 131/88  03/08/24 129/84

## 2024-06-10 NOTE — Patient Instructions (Addendum)
 I appreciate the opportunity to provide care to you today!    Follow up:  4 months  Labs: please stop by the lab during to get your blood drawn (CBC, CMP, TSH, Lipid profile, HgA1c, Vit D)  For a Healthier YOU, I Recommend: Reducing your intake of sugar, sodium, carbohydrates, and saturated fats. Increasing your fiber intake by incorporating more whole grains, fruits, and vegetables into your meals. Setting healthy goals with a focus on lowering your consumption of carbs, sugar, and unhealthy fats. Adding variety to your diet by including a wide range of fruits and vegetables. Cutting back on soda and limiting processed foods as much as possible. Staying active: In addition to taking your weight loss medication, aim for at least 150 minutes of moderate-intensity physical activity each week for optimal results.    Please follow up if your symptoms worsen or fail to improve.  Please continue to a heart-healthy diet and increase your physical activities. Try to exercise for at least five days a week.    It was a pleasure to see you and I look forward to continuing to work together on your health and well-being. Please do not hesitate to call the office if you need care or have questions about your care.  In case of emergency, please visit the Emergency Department for urgent care, or contact our clinic at [phone] to schedule an appointment. We're here to help you!   Have a wonderful day and week. With Gratitude, Partick Musselman MSN, FNP-BC

## 2024-06-10 NOTE — Assessment & Plan Note (Signed)
 The patient is on Lantus  18 units at bedtime and glimepiride  2 mg daily No reports of polyuria, polyphagia, and polydipsia Encouraged to decrease his intake of high sugar foods and beverages with increase physical activity Pending hemoglobin A1c

## 2024-06-19 ENCOUNTER — Encounter (HOSPITAL_COMMUNITY): Payer: Self-pay | Admitting: Occupational Therapy

## 2024-06-19 ENCOUNTER — Ambulatory Visit (HOSPITAL_COMMUNITY): Attending: Family Medicine | Admitting: Occupational Therapy

## 2024-06-19 ENCOUNTER — Encounter (HOSPITAL_COMMUNITY): Admitting: Occupational Therapy

## 2024-06-19 DIAGNOSIS — R29818 Other symptoms and signs involving the nervous system: Secondary | ICD-10-CM | POA: Insufficient documentation

## 2024-06-19 DIAGNOSIS — I69351 Hemiplegia and hemiparesis following cerebral infarction affecting right dominant side: Secondary | ICD-10-CM | POA: Insufficient documentation

## 2024-06-19 DIAGNOSIS — R278 Other lack of coordination: Secondary | ICD-10-CM | POA: Insufficient documentation

## 2024-06-19 NOTE — Therapy (Unsigned)
 OUTPATIENT OCCUPATIONAL THERAPY NEURO TREATMENT REASSESSMENT/RECERIFICATION  Patient Name: Zachary Winters MRN: [id number] DOB:[birth year], 43 y.o., male Today's Date: 06/19/2024  PCP: Zarwolo, Gloria, FNP REFERRING PROVIDER: Zarwolo, Gloria, FNP  END OF SESSION:     Past Medical History:  Diagnosis Date   Nausea and vomiting    chronic for years.   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)    decreased vision--has been getting injections   Type 1 diabetes mellitus (HCC)    Diagnosed at age 43 (has been on insulin  every since dx)   Past Surgical History:  Procedure Laterality Date   TEE WITHOUT CARDIOVERSION N/A 06/16/2023   Procedure: TRANSESOPHAGEAL ECHOCARDIOGRAM (TEE);  Surgeon: Debera Jayson MATSU, MD;  Location: [facility];  Service: Cardiovascular;  Laterality: N/A;   Patient Active Problem List   Diagnosis Date Noted   CVA (cerebral vascular accident) (HCC) 02/05/2024   H/O nausea and vomiting 07/07/2023   Stroke (HCC) 06/27/2023   Apraxia 06/19/2023   Vitamin D  deficiency 06/15/2023   Acute stroke due to ischemia (HCC) 06/14/2023   Dysphonia 06/14/2023   Aphasia 06/14/2023   Tobacco use disorder 06/14/2023   Recreational drug use 06/14/2023   Diabetic neuropathy (HCC) 06/14/2023   Dehydration    Lactic acidosis 03/14/2021   SIRS (systemic inflammatory response syndrome) (HCC) 03/14/2021   Abdominal pain with vomiting 03/14/2021   Somnolence 03/14/2021   Intractable nausea and vomiting    Type 2 diabetes mellitus with hyperglycemia (HCC) 01/01/2020   Hyperlipidemia associated with type 2 diabetes mellitus (HCC) 01/01/2020   Hypertension associated with diabetes (HCC) 10/31/2019   Essential hypertension 10/31/2019    ONSET DATE: 06/10/23  REFERRING DIAG:  I63.9 (ICD-10-CM) - Acute stroke due to ischemia (HCC)  I63.9 (ICD-10-CM) - Cerebrovascular accident (CVA), unspecified mechanism (HCC)     THERAPY DIAG:  No diagnosis found.  Rationale for Evaluation and Treatment: Rehabilitation  SUBJECTIVE:   SUBJECTIVE STATEMENT: This side is real tough. Pt accompanied by: self and family member  PERTINENT HISTORY: medical history significant for CVA with right-sided hemiplegia-wheelchair-bound, hypertension, dyslipidemia, and type 2 diabetes.  PRECAUTIONS: Fall  WEIGHT BEARING RESTRICTIONS: No  PAIN:  Are you having pain? Yes: NPRS scale: 9/10 Pain location: R side of body Pain description: Tingling and sore Aggravating factors: anything Relieving factors: unsure  FALLS: Has patient fallen in last 6 months? No  LIVING ENVIRONMENT: Lives with: lives with their family Lives in: House/apartment Stairs: No Has following equipment at home: Environmental consultant - 2 wheeled, Wheelchair (manual), shower chair, bed side commode, and Grab bars  PLOF: Needs assistance with ADLs and Needs assistance with transfers  PATIENT GOALS: To improve independence  OBJECTIVE:  Note: Objective measures were completed at Evaluation unless otherwise noted.  HAND DOMINANCE: Right  ADLs: Overall ADLs: Pt has RUE hemiplegia with limited active movement and severe tone. Pt requires mod to max assist with all ADL's, including but not limited to dressing, grooming, and bathing. He also requires mod to max assist for all transfers.  MOBILITY STATUS: Needs Assist: mod to max assist, mainly WC bound with stand pivot transfers-max assist from caregiver. Pt does not use hemi-walker or RW to assist.   POSTURE COMMENTS:  rounded shoulders and forward head Sitting balance: Sits without support for 30 sec  ACTIVITY TOLERANCE: Activity tolerance: pt reports increased fatigue.  FUNCTIONAL OUTCOME MEASURES: Quick Dash: 79.55  UPPER EXTREMITY ROM:    Pt has hypertonicity  with limited tolerance for passive stretching - all motions passively range less than 30% of full motion.  UPPER EXTREMITY MMT:     RUE  is 0/5 throughout. Pt with no trapezius activation, no voluntary or involuntary active motion noted.   HAND FUNCTION: 05/02/24-Pt with high tone, spasticity limiting function. Pt with mild to moderate contractures at the PIP joints, hand positioned in 75% fist. Able to tolerate passive stretching to ~60% fist.   COORDINATION: unable  SENSATION: Limited sensation on entire R side  EDEMA: Moderate edema noted in RUE from elbow to fingers  MUSCLE TONE: RUE: Severe and Hypertonic  COGNITION: Overall cognitive status: Impaired  VISION: Subjective report: no change Baseline vision: No visual deficits Visual history: Reports no vision difficulties  VISION ASSESSMENT: Not tested  Patient has difficulty with following activities due to following visual impairments: None  OBSERVATIONS: Pt has moderate expressive aphasia.  05/02/24: 2 finger subluxation at right shoulder.                                                                                                                              TREATMENT DATE:  05/02/24:  -Pt arrives with his niece who is his full time caregiver. Pt has not been seen since his evaluation on 03/20/24 and reports has not completed his HEP. Pt has no active movement of the RUE including the trapezius, and reports high pain with any passive movement. Lengthy discussion on goals of care with pt and his niece. Pt is currently wheelchair and bed-bound. Niece reports pt does not get out of bed unless he has an appointment. He uses depends for toileting. Niece feels pt is afraid of bed mobility tasks. He does have a hospital bed with rails. For transfers, niece's father performs a total assist transfer to the wheelchair using the gait belt, niece reports pt does not attempt to assist. Pt has a hemi-walker but only used with [facility], was instructed not to use without PT there and [facility] services ended in November 2024.  -Discussed progression of stroke recovery and realistic  expectations considering the severity of deficits pt is experiencing and the length of time since the stroke, almost 1 year. Pt emotional, asking when his arm would improve and be able to move. OT expressing understanding at the difficulty of the situation and outlook, explaining at this time we would transition to looking at adaptive strategies for task completion versus improving active RUE mobility.  -Niece verbalizing understanding of the education. OT recommending a PT referral to focus on bed mobility and transfers, to improve pt's safety and participation in the tasks and reduce caregiver burden. Considering current level of care provided at home and limited expected progress, recommended 2 OT sessions for splint fitting and providing HEP/AE instructions then transitioning to PT accounting for pt's visit limit set by insurance. Pt and niece agreeable to plan.    PATIENT EDUCATION: Education details: towel slides in flexion, self-stretching for  horizontal abduction. Recommend looking into condom catheter to reduce frequency of changing depends Person educated: Patient Education method: Explanation, Demonstration, and Handouts Education comprehension: verbalized understanding and returned demonstration  HOME EXERCISE PROGRAM: 3/26: Self Stretches 5/8: towel slides in flexion, self-stretching for horizontal abduction. Recommend looking into condom catheter to reduce frequency of changing depends   GOALS: Goals reviewed with patient? Yes  SHORT TERM GOALS: Target date: 04/26/24    Pt and caregiver will be provided with and educated on HEP to improve mobility of RUE and independence in self-care tasks.    Goal status: IN PROGRESS   2.   Pt and caregiver will be educated on weightbearing techniques to reduce pain, subluxation, and tone and improve tolerance to ROM tasks.    Goal status: IN PROGRESS   3.  Pt will improve RUE strength by at least 1 muscle grade to improve ability to  actively participate in self-care tasks using RUE as stabilization or gross assist.    Goal status: DEFERRED   4.   Pt will improve ability to make a gross grasp to approximately 25% or greater, to improve ability to use right hand as gross assist when stabilizing items.    Goal status: DEFERRED   5.  Pt will decrease pain in RUE to 4/10 or less as noted by verbalizing decreased pain and minimal facial grimacing during session activities.    Goal status: IN PROGRESS   6.  Pt will increase A/ROM to 25% in shoulder, elbow, and wrist, to improve mobility required for participation in ADL tasks.   Goal Status: DEFERRED   ASSESSMENT:  CLINICAL IMPRESSION: Pt presenting for first session since initial evaluation on 03/20/24. Pt has not completed his HEP and continues to require max to total assist from family for ADL completion. Pt is primarily bed bound at home, only transfers to wheelchair if he has an appointment. Extensive discussion regarding current functioning and goals of care, considering severity of deficits and length of time since the CVA which is almost 1 year. See above for details. Recommend static progressive splint to prevent further contracture of right hand and additional session for HEP and AE education. Referral to PT requested from MD as pt and family would benefit from PT for education on managing bed mobility and transfers safely.   PERFORMANCE DEFICITS: in functional skills including ADLs, IADLs, coordination, proprioception, sensation, edema, tone, ROM, strength, pain, fascial restrictions, muscle spasms, Fine motor control, Gross motor control, mobility, balance, body mechanics, endurance, and UE functional use, cognitive skills including energy/drive, problem solving, safety awareness, and sequencing, and psychosocial skills including coping strategies and environmental adaptation.   IMPAIRMENTS: are limiting patient from ADLs, IADLs, rest and sleep, work, leisure, and  social participation.   CO-MORBIDITIES: may have co-morbidities  that affects occupational performance. Patient will benefit from skilled OT to address above impairments and improve overall function.  MODIFICATION OR ASSISTANCE TO COMPLETE EVALUATION: Min-Moderate modification of tasks or assist with assess necessary to complete an evaluation.  OT OCCUPATIONAL PROFILE AND HISTORY: Detailed assessment: Review of records and additional review of physical, cognitive, psychosocial history related to current functional performance.  CLINICAL DECISION MAKING: Moderate - several treatment options, min-mod task modification necessary  REHAB POTENTIAL: Poor length of time since CVA, lack of mobility  EVALUATION COMPLEXITY: Moderate    PLAN:  OT FREQUENCY: 1x/week  OT DURATION: 2 sessions  PLANNED INTERVENTIONS: 97168 OT Re-evaluation, 97535 self care/ADL training, 02889 therapeutic exercise, 97530 therapeutic activity, 97112 neuromuscular re-education,  97140 manual therapy, 97035 ultrasound, 02989 moist heat, 97032 electrical stimulation (manual), [phone] Orthotics management and training, 97760 Splinting (initial encounter), [phone] Subsequent splinting/medication, passive range of motion, balance training, functional mobility training, energy conservation, coping strategies training, patient/family education, and DME and/or AE instructions  RECOMMENDED OTHER SERVICES: PT and Speech  CONSULTED AND AGREED WITH PLAN OF CARE: Patient  PLAN FOR NEXT SESSION: Weight bearing. Self stretching, progressive resting hand splint, possible k-tape for subluxation    Valentin Nightingale, OTR/L [phone] 06/19/2024, 4:32 PM

## 2024-06-20 ENCOUNTER — Ambulatory Visit: Admitting: Podiatry

## 2024-06-20 ENCOUNTER — Encounter: Payer: Self-pay | Admitting: Podiatry

## 2024-06-20 ENCOUNTER — Encounter (HOSPITAL_COMMUNITY): Payer: Self-pay | Admitting: Occupational Therapy

## 2024-06-20 DIAGNOSIS — B351 Tinea unguium: Secondary | ICD-10-CM

## 2024-06-20 DIAGNOSIS — M79675 Pain in left toe(s): Secondary | ICD-10-CM

## 2024-06-20 DIAGNOSIS — I639 Cerebral infarction, unspecified: Secondary | ICD-10-CM

## 2024-06-20 DIAGNOSIS — M79674 Pain in right toe(s): Secondary | ICD-10-CM

## 2024-06-20 DIAGNOSIS — Z794 Long term (current) use of insulin: Secondary | ICD-10-CM

## 2024-06-20 NOTE — Progress Notes (Signed)
 This patient presents to the office with chief complaint of long thick painful nails.  Patient says the nails are painful walking and wearing shoes.  This patient is unable to self treat.  This patient is unable to trim his  nails since he is unable to reach his nails.  Patient has history of CVS and diabetes neuropathy.  he presents to the office for preventative foot care services.  General Appearance  Alert, conversant and in no acute stress.  Vascular  Dorsalis pedis and posterior tibial  pulses are palpable  bilaterally.  Capillary return is within normal limits  bilaterally. Temperature is within normal limits  bilaterally.  Neurologic  Senn-Weinstein monofilament wire test within normal limits  bilaterally. Muscle power within normal limits bilaterally.  Nails Thick disfigured discolored nails with subungual debris  from hallux to fifth toes bilaterally. No evidence of bacterial infection or drainage bilaterally.  Orthopedic  No limitations of motion  feet .  No crepitus or effusions noted.  No bony pathology or digital deformities noted.  Skin  normotropic skin with no porokeratosis noted bilaterally.  No signs of infections or ulcers noted.     Onychomycosis  Nails  B/L.  Pain in right toes  Pain in left toes  IE.  Debridement of nails both feet followed trimming the nails with dremel tool.    RTC 4 months.   Cordella Bold DPM

## 2024-07-02 ENCOUNTER — Other Ambulatory Visit: Payer: Self-pay

## 2024-07-02 ENCOUNTER — Other Ambulatory Visit: Payer: Self-pay | Admitting: Family Medicine

## 2024-07-15 ENCOUNTER — Telehealth: Payer: Self-pay

## 2024-07-15 NOTE — Telephone Encounter (Signed)
 Copied from CRM [id number]. Topic: General - Other >> Jul 15, 2024  8:51 AM Delon HERO wrote: Reason for CRM: Patients sister Cherylynn Brunswick is calling to request a letter stating that her daughter Grayland Brunswick is the patients care taker during the day. This letter [facility] [employer]. Please contact when the letter is ready for pickup

## 2024-07-26 ENCOUNTER — Other Ambulatory Visit: Payer: Self-pay | Admitting: Family Medicine

## 2024-07-30 NOTE — Telephone Encounter (Signed)
 Patient's sister requesting update on letter.   Requesting callback, [phone]

## 2024-08-01 NOTE — Telephone Encounter (Signed)
 Patient calling back been 3 weeks have not heard back from nurse. [facility] called.

## 2024-08-05 ENCOUNTER — Encounter: Payer: Self-pay | Admitting: Family Medicine

## 2024-08-05 NOTE — Telephone Encounter (Signed)
 Letter ready for pick up

## 2024-08-08 ENCOUNTER — Other Ambulatory Visit: Payer: Self-pay | Admitting: Family Medicine

## 2024-08-08 ENCOUNTER — Other Ambulatory Visit: Payer: Self-pay | Admitting: Physical Medicine & Rehabilitation

## 2024-08-08 DIAGNOSIS — I10 Essential (primary) hypertension: Secondary | ICD-10-CM

## 2024-08-22 ENCOUNTER — Other Ambulatory Visit: Payer: Self-pay | Admitting: Family Medicine

## 2024-08-22 DIAGNOSIS — Z794 Long term (current) use of insulin: Secondary | ICD-10-CM

## 2024-08-22 DIAGNOSIS — I10 Essential (primary) hypertension: Secondary | ICD-10-CM

## 2024-08-22 DIAGNOSIS — E1169 Type 2 diabetes mellitus with other specified complication: Secondary | ICD-10-CM

## 2024-09-09 ENCOUNTER — Other Ambulatory Visit: Payer: Self-pay | Admitting: Family Medicine

## 2024-09-23 ENCOUNTER — Ambulatory Visit: Admitting: Podiatry

## 2024-09-23 ENCOUNTER — Encounter: Payer: Self-pay | Admitting: Podiatry

## 2024-09-23 DIAGNOSIS — M79674 Pain in right toe(s): Secondary | ICD-10-CM | POA: Diagnosis not present

## 2024-09-23 DIAGNOSIS — B351 Tinea unguium: Secondary | ICD-10-CM | POA: Diagnosis not present

## 2024-09-23 DIAGNOSIS — E1165 Type 2 diabetes mellitus with hyperglycemia: Secondary | ICD-10-CM

## 2024-09-23 DIAGNOSIS — I639 Cerebral infarction, unspecified: Secondary | ICD-10-CM

## 2024-09-23 DIAGNOSIS — M79675 Pain in left toe(s): Secondary | ICD-10-CM | POA: Diagnosis not present

## 2024-09-23 DIAGNOSIS — Z794 Long term (current) use of insulin: Secondary | ICD-10-CM

## 2024-09-23 NOTE — Progress Notes (Signed)
 This patient presents to the office with chief complaint of long thick painful nails.  Patient says the nails are painful walking and wearing shoes.  This patient is unable to self treat.  This patient is unable to trim his  nails since he is unable to reach his nails.  Patient has history of CVS and diabetes neuropathy.  he presents to the office for preventative foot care services.  General Appearance  Alert, conversant and in no acute stress.  Vascular  Dorsalis pedis and posterior tibial  pulses are palpable  bilaterally.  Capillary return is within normal limits  bilaterally. Temperature is within normal limits  bilaterally.  Neurologic  Senn-Weinstein monofilament wire test within normal limits  bilaterally. Muscle power within normal limits bilaterally.  Nails Thick disfigured discolored nails with subungual debris  from hallux to fifth toes bilaterally. No evidence of bacterial infection or drainage bilaterally.  Orthopedic  No limitations of motion  feet .  No crepitus or effusions noted.  No bony pathology or digital deformities noted. Severe contracture right leg/foot.  Skin  normotropic skin with no porokeratosis noted bilaterally.  No signs of infections or ulcers noted.     Onychomycosis  Nails  B/L.  Pain in right toes  Pain in left toes  Debridement of nails both feet followed trimming the nails with dremel tool.  Shanice needed to do his right foot since I was unable to debride nails due to my back and his contracture.  RTC 3  months.   Cordella Bold DPM

## 2024-09-30 ENCOUNTER — Other Ambulatory Visit: Payer: Self-pay | Admitting: Family Medicine

## 2024-09-30 DIAGNOSIS — I10 Essential (primary) hypertension: Secondary | ICD-10-CM

## 2024-10-14 ENCOUNTER — Encounter: Payer: Self-pay | Admitting: Family Medicine

## 2024-10-14 ENCOUNTER — Ambulatory Visit: Admitting: Family Medicine

## 2024-10-14 VITALS — BP 135/85 | HR 88 | Resp 16 | Ht 73.0 in

## 2024-10-14 DIAGNOSIS — E1165 Type 2 diabetes mellitus with hyperglycemia: Secondary | ICD-10-CM | POA: Diagnosis not present

## 2024-10-14 DIAGNOSIS — I10 Essential (primary) hypertension: Secondary | ICD-10-CM | POA: Diagnosis not present

## 2024-10-14 DIAGNOSIS — G40109 Localization-related (focal) (partial) symptomatic epilepsy and epileptic syndromes with simple partial seizures, not intractable, without status epilepticus: Secondary | ICD-10-CM

## 2024-10-14 DIAGNOSIS — Z794 Long term (current) use of insulin: Secondary | ICD-10-CM | POA: Diagnosis not present

## 2024-10-14 MED ORDER — LEVETIRACETAM 500 MG PO TABS: 500.0000 mg | ORAL_TABLET | Freq: Two times a day (BID) | ORAL | 5 refills | Status: AC

## 2024-10-14 NOTE — Assessment & Plan Note (Signed)
 Controlled Encouraged to continue take amlodipine  10 mg daily and carvedilol  6.25 mg twice daily Currently asymptomatic in the clinic Low-sodium diet with increased physical activity encouraged BP Readings from Last 3 Encounters:  10/14/24 135/85  06/10/24 135/83  04/22/24 131/88

## 2024-10-14 NOTE — Assessment & Plan Note (Signed)
 The patient is on Lantus  18 units at bedtime and glimepiride  2 mg daily No reports of polyuria, polyphagia, and polydipsia Encouraged to decrease his intake of high sugar foods and beverages with increase physical activity Pending hemoglobin A1c

## 2024-10-14 NOTE — Assessment & Plan Note (Signed)
 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 He has been started on Keppra  500 mg twice daily per neurology's recommendation. He needs to continue aspirin  and Plavix  for vertebral artery stenosis and follow up with neurology.  The patient reports that he will be following up with neurology in November 2025. Refills have been sent to the pharmacy.

## 2024-10-14 NOTE — Progress Notes (Signed)
 Established Patient Office Visit  Subjective:  Patient ID: Zachary Winters, male    DOB: 02-Mar-1981  Age: 43 y.o. MRN: [id number]  CC:  Chief Complaint  Patient presents with   Follow-up    HTN follow up    HPI Zachary Winters is a 43 y.o. male with past medical history of hypertension, stroke, type II diabetes, hyperlipidemia presents for f/u of  chronic medical conditions. For the details of today's visit, please refer to the assessment and plan.       Past Medical History:  Diagnosis Date   Nausea and vomiting    chronic for years.   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  Social History   Socioeconomic History   Marital status: Single    Spouse name: Not on file   Number of children: 1   Years of education: Not on file   Highest education level: Some college, no degree  Occupational History   Not on file  Tobacco Use   Smoking status: Former    Current packs/day: 0.25    Average packs/day: 0.3 packs/day for 20.0 years (5.0 ttl pk-yrs)    Types: Cigarettes   Smokeless tobacco: Never  Vaping Use   Vaping status: Never Used  Substance and Sexual Activity   Alcohol use: Not Currently   Drug use: Not Currently    Frequency: 2.0 times per week    Types: Marijuana   Sexual activity: Yes  Other Topics Concern   Not on file  Social History Narrative   Lives with parents   Has son 6 years-[name]      Enjoy: BB and son       Diet: everything    Caffeine: tea daily    Water : 3-4  bottles of 16 oz      Wear seat belt   Wear sun protection   Smoke detectors    Does not use phone while driving       Social Drivers of Corporate investment banker Strain: Low Risk  (10/31/2019)   Overall Financial Resource Strain (CARDIA)    Difficulty of Paying Living Expenses: Not hard at all  Food Insecurity: No Food Insecurity (02/05/2024)   Hunger Vital Sign    Worried About Running Out of Food in the Last Year: Never true    Ran Out of Food in the Last Year: Never true  Transportation Needs: No Transportation Needs (02/05/2024)   PRAPARE - Administrator, Civil Service (Medical): No    Lack of Transportation (Non-Medical): No  Physical Activity: Sufficiently Active (10/31/2019)   Exercise Vital Sign    Days of Exercise per Week: 3 days    Minutes of Exercise per Session: 60 min  Stress: No Stress Concern Present (10/31/2019)   [employer] of Occupational Health - Occupational Stress Questionnaire    Feeling of Stress : Not at all  Social Connections: Socially Isolated (10/31/2019)   Social Connection and Isolation  Panel    Frequency of Communication with Friends and Family: More than three times a week    Frequency of Social Gatherings with Friends and Family: More than three times a week    Attends Religious Services: Never    Database administrator or Organizations: No    Attends Banker Meetings: Never    Marital Status: Divorced  Catering manager Violence: Not At Risk (02/05/2024)   Humiliation, Afraid, Rape, and Kick questionnaire    Fear of Current or Ex-Partner: No    Emotionally Abused: No    Physically Abused: No    Sexually Abused: No    Outpatient Medications Prior to Visit  Medication Sig Dispense Refill   amLODipine  (NORVASC ) 10 MG tablet TAKE 1 TABLET(10 MG) BY MOUTH DAILY 90 tablet 1   aspirin  EC 81 MG tablet Take 81 mg by mouth daily. Swallow whole.     atorvastatin  (LIPITOR ) 80 MG tablet TAKE 1 TABLET(80 MG) BY MOUTH  DAILY AFTER SUPPER 90 tablet 1   baclofen  (LIORESAL ) 10 MG tablet TAKE 1 AND 1/2 TABLETS(15 MG) BY MOUTH THREE TIMES DAILY 135 tablet 1   BD PEN NEEDLE NANO 2ND GEN 32G X 4 MM MISC USE ONCE DAILY TO INJECT INSULIN  100 each 2   blood glucose meter kit and supplies Dispense based on patient and insurance preference. Check blood sugar four times daily as directed. (FOR ICD-10 E10.9, E11.9). 1 each 0   carvedilol  (COREG ) 6.25 MG tablet TAKE 1 TABLET(6.25 MG) BY MOUTH TWICE DAILY WITH A MEAL 90 tablet 1   clopidogrel  (PLAVIX ) 75 MG tablet TAKE 1 TABLET(75 MG) BY MOUTH DAILY 60 tablet 2   ezetimibe  (ZETIA ) 10 MG tablet Take 1 tablet (10 mg total) by mouth daily. 90 tablet 3   ezetimibe  (ZETIA ) 10 MG tablet Take 1 tablet (10 mg total) by mouth daily. 30 tablet 3   glimepiride  (AMARYL ) 2 MG tablet TAKE 1 TABLET(2 MG) BY MOUTH DAILY WITH BREAKFAST 90 tablet 1   insulin  glargine (LANTUS ) 100 UNIT/ML Solostar Pen Inject 18 Units into the skin at bedtime. 15 mL 11   levETIRAcetam  (KEPPRA ) 500 MG tablet TAKE 1 TABLET(500 MG) BY MOUTH TWICE DAILY 60 tablet 0   No facility-administered medications prior to visit.    Allergies  Allergen Reactions   Chocolate     vomiting   Metformin  Nausea And Vomiting   Penicillins Swelling    ROS Review of Systems  Constitutional:  Negative for fatigue and fever.  Eyes:  Negative for visual disturbance.  Respiratory:  Negative for chest tightness and shortness of breath.   Cardiovascular:  Negative for chest pain and palpitations.  Neurological:  Negative for dizziness and headaches.      Objective:    Physical Exam HENT:     Head: Normocephalic.     Right Ear: External ear normal.     Left Ear: External ear normal.     Nose: No congestion or rhinorrhea.     Mouth/Throat:     Mouth: Mucous membranes are moist.  Cardiovascular:     Rate and Rhythm: Regular rhythm.     Heart sounds: No murmur heard. Pulmonary:     Effort: No respiratory distress.      Breath sounds: Normal breath sounds.  Neurological:     Mental Status: He is alert.     BP 135/85   Pulse 88   Resp 16   Ht 6' 1 (1.854 m)   SpO2 97%  BMI 25.60 kg/m  Wt Readings from Last 3 Encounters:  02/05/24 194 lb (88 kg)  09/27/23 172 lb (78 kg)  08/22/23 176 lb 2.4 oz (79.9 kg)    Lab Results  Component Value Date   TSH 1.720 11/21/2023   Lab Results  Component Value Date   WBC 7.6 02/05/2024   HGB 10.7 (L) 02/05/2024   HCT 32.5 (L) 02/05/2024   MCV 89.8 02/05/2024   PLT 228 02/05/2024   Lab Results  Component Value Date   NA 140 02/05/2024   K 4.2 02/05/2024   CO2 24 02/05/2024   GLUCOSE 101 (H) 02/05/2024   BUN 37 (H) 02/05/2024   CREATININE 2.05 (H) 02/05/2024   BILITOT 0.3 02/05/2024   ALKPHOS 99 02/05/2024   AST 40 02/05/2024   ALT 74 (H) 02/05/2024   PROT 7.4 02/05/2024   ALBUMIN 3.6 02/05/2024   CALCIUM  8.9 02/05/2024   ANIONGAP 7 02/05/2024   EGFR 57 (L) 11/21/2023   Lab Results  Component Value Date   CHOL 156 04/22/2024   Lab Results  Component Value Date   HDL 47 04/22/2024   Lab Results  Component Value Date   LDLCALC 96 04/22/2024   Lab Results  Component Value Date   TRIG 65 04/22/2024   Lab Results  Component Value Date   CHOLHDL 3.3 04/22/2024   Lab Results  Component Value Date   HGBA1C 6.2 (H) 04/22/2024      Assessment & Plan:  Type 2 diabetes mellitus with hyperglycemia, with long-term current use of insulin  (HCC) Assessment & Plan: The patient is on Lantus  18 units at bedtime and glimepiride  2 mg daily No reports of polyuria, polyphagia, and polydipsia Encouraged to decrease his intake of high sugar foods and beverages with increase physical activity Pending hemoglobin A1c  Orders: -     Microalbumin / creatinine urine ratio  Focal epilepsy (HCC) Assessment & Plan: The EEG demonstrates new-onset focal epilepsy in the setting of an underlying stroke. He has been started on Keppra  500 mg twice daily  per neurology's recommendation. He needs to continue aspirin  and Plavix  for vertebral artery stenosis and follow up with neurology.  The patient reports that he will be following up with neurology in November 2025. Refills have been sent to the pharmacy.   Orders: -     levETIRAcetam ; Take 1 tablet (500 mg total) by mouth 2 (two) times daily.  Dispense: 60 tablet; Refill: 5  Essential hypertension Assessment & Plan: Controlled Encouraged to continue take amlodipine  10 mg daily and carvedilol  6.25 mg twice daily Currently asymptomatic in the clinic Low-sodium diet with increased physical activity encouraged BP Readings from Last 3 Encounters:  10/14/24 135/85  06/10/24 135/83  04/22/24 131/88      Note: This chart has been completed using [employer] software, and while attempts have been made to ensure accuracy, certain words and phrases may not be transcribed as intended.    Follow-up: Return in about 5 months (around 03/14/2025).   Jaira Canady  Z Bacchus, FNP

## 2024-10-14 NOTE — Patient Instructions (Addendum)
 I appreciate the opportunity to provide care to you today!    Follow up:  5 months  Labs: please stop by the lab during the week to get your blood drawn (CBC, CMP, TSH, Lipid profile, HgA1c, Vit D)  For a Healthier YOU, I Recommend: Reducing your intake of sugar, sodium, carbohydrates, and saturated fats. Increasing your fiber intake by incorporating more whole grains, fruits, and vegetables into your meals. Setting healthy goals with a focus on lowering your consumption of carbs, sugar, and unhealthy fats. Adding variety to your diet by including a wide range of fruits and vegetables. Cutting back on soda and limiting processed foods as much as possible. Staying active: In addition to taking your weight loss medication, aim for at least 150 minutes of moderate-intensity physical activity each week for optimal results.    Please follow up if your symptoms worsen or fail to improve.     Please continue to a heart-healthy diet and increase your physical activities. Try to exercise for at least five days a week.    It was a pleasure to see you and I look forward to continuing to work together on your health and well-being. Please do not hesitate to call the office if you need care or have questions about your care.  In case of emergency, please visit the Emergency Department for urgent care, or contact our clinic at [phone] to schedule an appointment. We're here to help you!   Have a wonderful day and week. With Gratitude, Meade JENEANE Gerlach MSN, FNP-BC, PMHNP-BC

## 2024-10-29 ENCOUNTER — Ambulatory Visit: Admitting: Neurology

## 2024-11-05 ENCOUNTER — Telehealth: Payer: Self-pay | Admitting: Neurology

## 2024-11-05 NOTE — Telephone Encounter (Signed)
 LVM and sent mychart msg informing pt of need to reschedule 11/06/24 appt - NP out

## 2024-11-06 ENCOUNTER — Ambulatory Visit: Admitting: Neurology

## 2024-11-16 ENCOUNTER — Other Ambulatory Visit: Payer: Self-pay | Admitting: Physical Medicine & Rehabilitation

## 2024-12-24 ENCOUNTER — Ambulatory Visit (INDEPENDENT_AMBULATORY_CARE_PROVIDER_SITE_OTHER): Admitting: Podiatry

## 2024-12-24 ENCOUNTER — Encounter: Payer: Self-pay | Admitting: Podiatry

## 2024-12-24 VITALS — Ht 73.0 in | Wt 194.0 lb

## 2024-12-24 DIAGNOSIS — B351 Tinea unguium: Secondary | ICD-10-CM | POA: Diagnosis not present

## 2024-12-24 DIAGNOSIS — I639 Cerebral infarction, unspecified: Secondary | ICD-10-CM

## 2024-12-24 DIAGNOSIS — E1165 Type 2 diabetes mellitus with hyperglycemia: Secondary | ICD-10-CM | POA: Diagnosis not present

## 2024-12-24 DIAGNOSIS — M79675 Pain in left toe(s): Secondary | ICD-10-CM

## 2024-12-24 DIAGNOSIS — L988 Other specified disorders of the skin and subcutaneous tissue: Secondary | ICD-10-CM | POA: Diagnosis not present

## 2024-12-24 DIAGNOSIS — M79674 Pain in right toe(s): Secondary | ICD-10-CM

## 2024-12-24 DIAGNOSIS — Z794 Long term (current) use of insulin: Secondary | ICD-10-CM | POA: Diagnosis not present

## 2024-12-24 DIAGNOSIS — L97521 Non-pressure chronic ulcer of other part of left foot limited to breakdown of skin: Secondary | ICD-10-CM | POA: Insufficient documentation

## 2024-12-25 ENCOUNTER — Encounter: Payer: Self-pay | Admitting: Podiatry

## 2024-12-25 NOTE — Progress Notes (Signed)
 This patient presents to the office with chief complaint of long thick painful nails.  Patient says the nails are painful walking and wearing shoes.  This patient is unable to self treat.  This patient is unable to trim his  nails since he is unable to reach his nails.  Patient has history of CVA and diabetes neuropathy. Patient presents to the office with male caregiver and in a wheelchair. he presents to the office for preventative foot care services.  General Appearance  Alert, conversant and in no acute stress.  Vascular  Dorsalis pedis and posterior tibial  pulses are palpable  bilaterally.  Capillary return is within normal limits  bilaterally. Temperature is within normal limits  bilaterally.  Neurologic  Senn-Weinstein monofilament wire test within normal limits  bilaterally. Muscle power within normal limits bilaterally.  Nails Thick disfigured discolored nails with subungual debris  from hallux to fifth toes bilaterally. No evidence of bacterial infection or drainage bilaterally.  Orthopedic  No limitations of motion  feet .  No crepitus or effusions noted.  No bony pathology or digital deformities noted. Severe contracture right leg/foot.  HAV 1st MPJ left foot.  Hallux interphalangeus IPJ left foot.  Overlapping second toe left foot.  Skin  No porokeratosis noted bilaterally.  No signs of infections .  Significant hyperkeratotic tissue noted plantar aspect feet  B/l.Patient has masceration between 1/2 left toes due to HAV and HI and overlapping second toe.  Onychomycosis  Nails  B/L.  Pain in right toes  Pain in left toes  Masceration 1/2 left foot.  Debridement of nails both feet performed with nail nipper and dremel tool.  Andrez helped with the debridement of nails right foot . Upon debridement of his nails left foot it was noted that there was severe masceration between 1/2 toes left foot.  No signs of swelling, redness or infection noted.  Cleaning of the exposed necrotic tissue with  gause pads exposind bleeding at two sites that  intersect.  Neosporin/DSD.  Padding dispensed.  Discussed etiology of the masceration and importance of wearing toe caps.  RTC 3  months.  Cordella Bold DPM    Cordella Bold DPM

## 2025-01-01 ENCOUNTER — Encounter: Payer: Self-pay | Admitting: Neurology

## 2025-01-01 ENCOUNTER — Telehealth: Payer: Self-pay | Admitting: Neurology

## 2025-01-01 ENCOUNTER — Ambulatory Visit: Admitting: Neurology

## 2025-01-01 NOTE — Telephone Encounter (Signed)
 Mailed letter to inform pt of his missed appt on 01/01/25

## 2025-01-01 NOTE — Telephone Encounter (Deleted)
 Please be advised that you recently missed an appointment at [facility] on 01/01/2025.  In order for our practice to provide you with proper medical care, it is important that you keep your scheduled appointments.    It is our office policy that patients who have missed three (3) scheduled appointments or (2) new patient visits be dismissed from our practice.  If you are unable to keep a scheduled appointment, please call at least 24 hours in advance to reschedule to avoid having this count as a missed appointment.

## 2025-01-01 NOTE — Progress Notes (Deleted)
 "  [facility]  PATIENT: Zachary Winters DOB: 09-Mar-1981  REFERRING CLINICIAN: Edman Meade PEDLAR, FNP HISTORY FROM: patient  REASON FOR VISIT: stroke follow up   HISTORICAL  CHIEF COMPLAINT:  No chief complaint on file.   HISTORY OF PRESENT ILLNESS:  Update 01/01/25 SS: Last visit LDL 96, A1C 6.2. Added Zetia  10 mg daily. Recommended repeat in 2 months. TCD showed mild stenosis of bilateral middle cerebral arteries right more than left.  Carotid ultrasound did not show any significant narrowing.  Update 04/22/2024 : Patient is seen for follow-up today after last visit in the office with Dr. Margaret in October 2024.  He is accompanied by sister.  He presented on 02/05/2024 with a witnessed seizure in the morning witnessed by family member.  It lasted a few minutes and was followed by postictal confusion.  CT scan of the head as well as MRI scan showed no acute abnormalities.  CT angiogram showed no large vessel stenosis or occlusion.  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  There was new moderate irregularity and stenosis of the right PCA P1/P2 junction.  EEG showed continuous slowing in the left frontotemporal region.  Patient was started on Keppra .  He states he has done well since then is that no further recurrent seizures.  He is tolerating Keppra  well without any side effects.  Patient remains on aspirin  and Plavix  which he is tolerating well without bruising or bleeding.  He is tolerating Lipitor  well without muscle aches and pains.  He states his sugars under good control.  Last lipid profile on 02/06/2024 was showing LDL 76 mg percent.  He has not had any recurrent stroke or TIA symptoms. UPDATE (09/27/23, VRP): 44 year old male here for stroke hospital follow up. Since last visit, doing well. Now living with sister, who is primary caregiver. Language gradually  improving. Right sided still very weak. Tolerating meds.   PRIOR HPI (Dr. Rosemarie, 06/26/23): 44 y.o. male with history of diabetes, hypertension, hyperlipidemia and smoking who was initially admitted to [facility] on 6/19 with right hemiplegia and aphasia.  He presented outside the window for TNK and thrombectomy.  He was found to have a left basal ganglia infarct, which is now extended into much of the left MCA territory, and he also developed a small acute infarct in posterior limb of right internal capsule.  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  Yesterday, he was transferred here to be seen by stroke team and possibly for conventional angiogram given severe stenosis.  Admission urine toxicology was positive for THC.  Patient's grandmother states that he does not often go to the doctor and does  smoke cigarettes.  REVIEW OF SYSTEMS: Full 14 system review of systems performed and negative with exception of: as per HPI.  ALLERGIES: Allergies  Allergen Reactions   Chocolate     vomiting   Metformin  Nausea And Vomiting   Penicillins Swelling    HOME MEDICATIONS: Outpatient Medications Prior to Visit  Medication Sig Dispense Refill   amLODipine  (NORVASC ) 10 MG tablet TAKE 1 TABLET(10 MG) BY MOUTH DAILY 90 tablet 1   aspirin  EC 81 MG tablet Take 81 mg by mouth daily. Swallow whole.     atorvastatin  (LIPITOR ) 80 MG tablet TAKE 1 TABLET(80 MG) BY MOUTH DAILY AFTER SUPPER 90 tablet 1   baclofen  (LIORESAL ) 10 MG tablet TAKE 1 AND 1/2  TABLETS(15 MG) BY MOUTH THREE TIMES DAILY 135 tablet 1   BD PEN NEEDLE NANO 2ND GEN 32G X 4 MM MISC USE ONCE DAILY TO INJECT INSULIN  100 each 2   blood glucose meter kit and supplies Dispense based on patient and insurance preference. Check blood sugar four times daily as directed. (FOR ICD-10 E10.9, E11.9). 1 each 0   carvedilol  (COREG ) 6.25 MG tablet TAKE 1 TABLET(6.25 MG) BY MOUTH TWICE DAILY WITH A MEAL 90 tablet 1   clopidogrel  (PLAVIX ) 75 MG tablet TAKE  1 TABLET(75 MG) BY MOUTH DAILY 60 tablet 2   ezetimibe  (ZETIA ) 10 MG tablet Take 1 tablet (10 mg total) by mouth daily. 90 tablet 3   ezetimibe  (ZETIA ) 10 MG tablet Take 1 tablet (10 mg total) by mouth daily. 30 tablet 3   glimepiride  (AMARYL ) 2 MG tablet TAKE 1 TABLET(2 MG) BY MOUTH DAILY WITH BREAKFAST 90 tablet 1   insulin  glargine (LANTUS ) 100 UNIT/ML Solostar Pen Inject 18 Units into the skin at bedtime. 15 mL 11   levETIRAcetam  (KEPPRA ) 500 MG tablet Take 1 tablet (500 mg total) by mouth 2 (two) times daily. 60 tablet 5   No facility-administered medications prior to visit.    PAST MEDICAL HISTORY: Past Medical History:  Diagnosis Date   Nausea and vomiting    chronic for years.   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  SOCIAL HISTORY: Social History   Socioeconomic History   Marital status: Single    Spouse name: Not on file   Number of children: 1   Years of education: Not on file   Highest education level: Some college, no degree  Occupational History   Not on file  Tobacco Use   Smoking status: Former    Current packs/day: 0.25    Average packs/day: 0.3 packs/day for 20.0 years (5.0 ttl pk-yrs)    Types: Cigarettes   Smokeless tobacco: Never  Vaping Use   Vaping status: Never Used  Substance and Sexual Activity   Alcohol use: Not Currently   Drug use: Not Currently    Frequency: 2.0 times per week    Types: Marijuana   Sexual activity: Yes   Other Topics Concern   Not on file  Social History Narrative   Lives with parents   Has son 6 years-[name]      Enjoy: BB and son       Diet: everything    Caffeine: tea daily    Water : 3-4 bottles of 16 oz      Wear seat belt   Wear sun protection   Smoke detectors    Does not use phone while driving       Social Drivers of Health   Tobacco Use: Medium Risk (12/25/2024)   Patient History    Smoking Tobacco Use: Former    Smokeless Tobacco Use: Never    Passive Exposure: Not on Actuary Strain: Not on  file  Food Insecurity: No Food Insecurity (02/05/2024)   Hunger Vital Sign    Worried About Running Out of Food in the Last Year: Never true    Ran Out of Food in the Last Year: Never true  Transportation Needs: No Transportation Needs (02/05/2024)   PRAPARE - Administrator, Civil Service (Medical): No    Lack of Transportation (Non-Medical): No  Physical Activity: Not on file  Stress: Not on file  Social Connections: Not on file  Intimate Partner Violence: Not At Risk (02/05/2024)   Humiliation, Afraid, Rape, and Kick questionnaire    Fear of Current or Ex-Partner: No    Emotionally Abused: No    Physically Abused: No    Sexually Abused: No  Depression (PHQ2-9): Low Risk (10/14/2024)   Depression (PHQ2-9)    PHQ-2 Score: 0  Alcohol Screen: Not on file  Housing: Low Risk (02/05/2024)   Housing Stability Vital Sign    Unable to Pay for Housing in the Last Year: No    Number of Times Moved in the Last Year: 0    Homeless in the Last Year: No  Utilities: Not At Risk (02/05/2024)   AHC Utilities    Threatened with loss of utilities: No  Health Literacy: Not on file     PHYSICAL EXAM  GENERAL EXAM/CONSTITUTIONAL: Vitals:  There were no vitals filed for this visit.  There is no height or weight on file to calculate BMI. Wt Readings from Last 3 Encounters:  12/24/24 194 lb (88 kg)  02/05/24 194 lb (88 kg)  09/27/23 172 lb (78 kg)    Patient is in no distress; well developed, nourished and groomed; neck is supple  CARDIOVASCULAR: Examination of carotid arteries is normal; no carotid bruits Regular rate and rhythm, no murmurs Examination of peripheral vascular system by observation and palpation is normal  EYES: Ophthalmoscopic exam of optic discs and posterior segments is normal; no papilledema or hemorrhages No results found.  MUSCULOSKELETAL: Gait, strength, tone, movements noted in Neurologic exam below  NEUROLOGIC: MENTAL STATUS:      No data to display         awake, alert, oriented to person Moderate expressive aphasia   Mild comprehension impairment.  Difficulty with naming and repetition.  CRANIAL NERVE:  2nd - no papilledema on fundoscopic exam 2nd, 3rd, 4th, 6th - pupils equal and reactive to light, left gaze preference but able to look all the way to the right extraocular muscles intact, no nystagmus dense right homonymous hemianopsia 5th - facial sensation symmetric 7th - facial strength --> DENSE RIGHT LOWER FACIAL WEAKNESS 8th - hearing intact 9th - palate elevates symmetrically, uvula midline 11th - shoulder shrug --> ABSENT ON RIGHT 12th - tongue protrusion --> DEVIATES TO RIGHT  MOTOR:  SPASTIC, DENSE RIGHT HEMIPLEGIA (0/5) right foot drop normal bulk and tone strength in the LUE, LLE  SENSORY:  DECR IN RIGHT ARM AND RIGHT LEG  COORDINATION:  finger-nose-finger, fine finger movements NORMAL ON LEFT  REFLEXES:  deep tendon reflexes --> SLIGHTLY BRISK IN RUE AND RLE  GAIT/STATION:  IN WHEEL CHAIR   NIH stroke scale 14 Modified Rankin score 4.  DIAGNOSTIC DATA (LABS, IMAGING, TESTING) - I reviewed patient records, labs, notes, testing and imaging myself where available.  Lab Results  Component Value Date   WBC 7.6 02/05/2024   HGB 10.7 (L) 02/05/2024   HCT 32.5 (L) 02/05/2024   MCV 89.8 02/05/2024   PLT 228 02/05/2024  Component Value Date/Time   NA 140  02/05/2024 0410   NA 144 11/21/2023 1113   K 4.2 02/05/2024 0410   CL 109 02/05/2024 0410   CO2 24 02/05/2024 0410   GLUCOSE 101 (H) 02/05/2024 0410   BUN 37 (H) 02/05/2024 0410   BUN 26 (H) 11/21/2023 1113   CREATININE 2.05 (H) 02/05/2024 0410   CREATININE 1.15 10/31/2019 0954   CALCIUM  8.9 02/05/2024 0410   PROT 7.4 02/05/2024 0410   PROT 7.5 11/21/2023 1113   ALBUMIN 3.6 02/05/2024 0410   ALBUMIN 4.0 (L) 11/21/2023 1113   AST 40 02/05/2024 0410   ALT 74 (H) 02/05/2024 0410   ALKPHOS 99 02/05/2024 0410   BILITOT 0.3 02/05/2024 0410   BILITOT 0.3 11/21/2023 1113   GFRNONAA 41 (L) 02/05/2024 0410   GFRNONAA 81 10/31/2019 0954   GFRAA 94 10/31/2019 0954   Lab Results  Component Value Date   CHOL 156 04/22/2024   HDL 47 04/22/2024   LDLCALC 96 04/22/2024   TRIG 65 04/22/2024   CHOLHDL 3.3 04/22/2024   Lab Results  Component Value Date   HGBA1C 6.2 (H) 04/22/2024   No results found for: VITAMINB12 Lab Results  Component Value Date   TSH 1.720 11/21/2023      ASSESSMENT AND PLAN  43 y.o. 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  Continue aspirin  81 mg daily but stop Plavix  now for secondary stroke prevention and maintain strict control of hypertension with blood pressure goal below 130/90, diabetes with hemoglobin A1c goal below 6.5% and lipids with LDL cholesterol goal below 70 mg/dL. I also advised the patient to eat a healthy diet with plenty of whole grains, cereals, fruits and vegetables, exercise regularly and maintain ideal body weight.  Check screening lipid profile, hemoglobin A1c, carotid ultrasound and transcranial Doppler studies.  Followup in the future with: nurse practitioner in 6 months or call earlier if necessary. Greater than 50% time during this 40-minute visit was spent in counseling and coordination of care about his new onset seizures as well as remote stroke and spastic hemiplegia and discussion with patient and sister and answering questions.   Eather Popp, , MD 01/01/2025, 5:52 AM Certified in Neurology, Neurophysiology and Neuroimaging  [facility] [address], [address] [place], [place] [address] [phone]  "

## 2025-01-02 ENCOUNTER — Other Ambulatory Visit: Payer: Self-pay | Admitting: Family Medicine

## 2025-01-02 DIAGNOSIS — E559 Vitamin D deficiency, unspecified: Secondary | ICD-10-CM

## 2025-01-02 DIAGNOSIS — E038 Other specified hypothyroidism: Secondary | ICD-10-CM

## 2025-01-02 DIAGNOSIS — E782 Mixed hyperlipidemia: Secondary | ICD-10-CM

## 2025-01-02 DIAGNOSIS — I10 Essential (primary) hypertension: Secondary | ICD-10-CM

## 2025-01-02 DIAGNOSIS — R7301 Impaired fasting glucose: Secondary | ICD-10-CM

## 2025-01-02 DIAGNOSIS — E1140 Type 2 diabetes mellitus with diabetic neuropathy, unspecified: Secondary | ICD-10-CM

## 2025-01-30 NOTE — Progress Notes (Unsigned)
 "  [facility]  PATIENT: Zachary Winters DOB: 25-Oct-1981  REFERRING CLINICIAN: Edman Meade PEDLAR, FNP HISTORY FROM: patient  REASON FOR VISIT: stroke follow up   HISTORICAL  CHIEF COMPLAINT:  No chief complaint on file.   HISTORY OF PRESENT ILLNESS:  Update 01/01/25 SS: Last visit LDL 96, A1C 6.2. Added Zetia  10 mg daily. Recommended repeat in 2 months. TCD showed mild stenosis of bilateral middle cerebral arteries right more than left.  Carotid ultrasound did not show any significant narrowing.  Update 04/22/2024 : Patient is seen for follow-up today after last visit in the office with Dr. Margaret in October 2024.  He is accompanied by sister.  He presented on 02/05/2024 with a witnessed seizure in the morning witnessed by family member.  It lasted a few minutes and was followed by postictal confusion.  CT scan of the head as well as MRI scan showed no acute abnormalities.  CT angiogram showed no large vessel stenosis or occlusion.  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  There was new moderate irregularity and stenosis of the right PCA P1/P2 junction.  EEG showed continuous slowing in the left frontotemporal region.  Patient was started on Keppra .  He states he has done well since then is that no further recurrent seizures.  He is tolerating Keppra  well without any side effects.  Patient remains on aspirin  and Plavix  which he is tolerating well without bruising or bleeding.  He is tolerating Lipitor  well without muscle aches and pains.  He states his sugars under good control.  Last lipid profile on 02/06/2024 was showing LDL 76 mg percent.  He has not had any recurrent stroke or TIA symptoms. UPDATE (09/27/23, VRP): 44 year old male here for stroke hospital follow up. Since last visit, doing well. Now living with sister, who is primary caregiver. Language gradually  improving. Right sided still very weak. Tolerating meds.   PRIOR HPI (Dr. Rosemarie, 06/26/23): 44 y.o. male with history of diabetes, hypertension, hyperlipidemia and smoking who was initially admitted to [facility] on 6/19 with right hemiplegia and aphasia.  He presented outside the window for TNK and thrombectomy.  He was found to have a left basal ganglia infarct, which is now extended into much of the left MCA territory, and he also developed a small acute infarct in posterior limb of right internal capsule.  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  Yesterday, he was transferred here to be seen by stroke team and possibly for conventional angiogram given severe stenosis.  Admission urine toxicology was positive for THC.  Patient's grandmother states that he does not often go to the doctor and does  smoke cigarettes.  REVIEW OF SYSTEMS: Full 14 system review of systems performed and negative with exception of: as per HPI.  ALLERGIES: Allergies  Allergen Reactions   Chocolate     vomiting   Metformin  Nausea And Vomiting   Penicillins Swelling    HOME MEDICATIONS: Outpatient Medications Prior to Visit  Medication Sig Dispense Refill   amLODipine  (NORVASC ) 10 MG tablet TAKE 1 TABLET(10 MG) BY MOUTH DAILY 90 tablet 1   aspirin  EC 81 MG tablet Take 81 mg by mouth daily. Swallow whole.     atorvastatin  (LIPITOR ) 80 MG tablet TAKE 1 TABLET(80 MG) BY MOUTH DAILY AFTER SUPPER 90 tablet 1   baclofen  (LIORESAL ) 10 MG tablet TAKE 1 AND 1/2  TABLETS(15 MG) BY MOUTH THREE TIMES DAILY 135 tablet 1   BD PEN NEEDLE NANO 2ND GEN 32G X 4 MM MISC USE ONCE DAILY TO INJECT INSULIN  100 each 2   blood glucose meter kit and supplies Dispense based on patient and insurance preference. Check blood sugar four times daily as directed. (FOR ICD-10 E10.9, E11.9). 1 each 0   carvedilol  (COREG ) 6.25 MG tablet TAKE 1 TABLET(6.25 MG) BY MOUTH TWICE DAILY WITH A MEAL 90 tablet 1   clopidogrel  (PLAVIX ) 75 MG tablet TAKE  1 TABLET(75 MG) BY MOUTH DAILY 60 tablet 2   ezetimibe  (ZETIA ) 10 MG tablet Take 1 tablet (10 mg total) by mouth daily. 90 tablet 3   ezetimibe  (ZETIA ) 10 MG tablet Take 1 tablet (10 mg total) by mouth daily. 30 tablet 3   glimepiride  (AMARYL ) 2 MG tablet TAKE 1 TABLET(2 MG) BY MOUTH DAILY WITH BREAKFAST 90 tablet 1   LANTUS  SOLOSTAR 100 UNIT/ML Solostar Pen INJECT 18 UNITS UNDER THE SKIN EVERY NIGHT AT BEDTIME 15 mL 11   levETIRAcetam  (KEPPRA ) 500 MG tablet Take 1 tablet (500 mg total) by mouth 2 (two) times daily. 60 tablet 5   No facility-administered medications prior to visit.    PAST MEDICAL HISTORY: Past Medical History:  Diagnosis Date   Nausea and vomiting    chronic for years.   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  SOCIAL HISTORY: Social History   Socioeconomic History   Marital status: Single    Spouse name: Not on file   Number of children: 1   Years of education: Not on file   Highest education level: Some college, no degree  Occupational History   Not on file  Tobacco Use   Smoking status: Former    Current packs/day: 0.25    Average packs/day: 0.3 packs/day for 20.0 years (5.0 ttl pk-yrs)    Types: Cigarettes   Smokeless tobacco: Never  Vaping Use   Vaping status: Never Used  Substance and Sexual Activity   Alcohol use: Not Currently   Drug use: Not Currently    Frequency: 2.0 times per week    Types: Marijuana   Sexual activity: Yes   Other Topics Concern   Not on file  Social History Narrative   Lives with parents   Has son 6 years-[name]      Enjoy: BB and son       Diet: everything    Caffeine: tea daily    Water : 3-4 bottles of 16 oz      Wear seat belt   Wear sun protection   Smoke detectors    Does not use phone while driving       Social Drivers of Health   Tobacco Use: Medium Risk (12/25/2024)   Patient History    Smoking Tobacco Use: Former    Smokeless Tobacco Use: Never    Passive Exposure: Not on Actuary Strain: Not  on file  Food Insecurity: No Food Insecurity (02/05/2024)   Hunger Vital Sign    Worried About Running Out of Food in the Last Year: Never true    Ran Out of Food in the Last Year: Never true  Transportation Needs: No Transportation Needs (02/05/2024)   PRAPARE - Administrator, Civil Service (Medical): No    Lack of Transportation (Non-Medical): No  Physical Activity: Not on file  Stress: Not on file  Social Connections: Not on file  Intimate Partner Violence: Not At Risk (02/05/2024)   Humiliation, Afraid, Rape, and Kick questionnaire    Fear of Current or Ex-Partner: No    Emotionally Abused: No    Physically Abused: No    Sexually Abused: No  Depression (PHQ2-9): Low Risk (10/14/2024)   Depression (PHQ2-9)    PHQ-2 Score: 0  Alcohol Screen: Not on file  Housing: Low Risk (02/05/2024)   Housing Stability Vital Sign    Unable to Pay for Housing in the Last Year: No    Number of Times Moved in the Last Year: 0    Homeless in the Last Year: No  Utilities: Not At Risk (02/05/2024)   AHC Utilities    Threatened with loss of utilities: No  Health Literacy: Not on file     PHYSICAL EXAM  GENERAL EXAM/CONSTITUTIONAL: Vitals:  There were no vitals filed for this visit.  There is no height or weight on file to calculate BMI. Wt Readings from Last 3 Encounters:  12/24/24 194 lb (88 kg)  02/05/24 194 lb (88 kg)  09/27/23 172 lb (78 kg)    Patient is in no distress; well developed, nourished and groomed; neck is supple  CARDIOVASCULAR: Examination of carotid arteries is normal; no carotid bruits Regular rate and rhythm, no murmurs Examination of peripheral vascular system by observation and palpation is normal  EYES: Ophthalmoscopic exam of optic discs and posterior segments is normal; no papilledema or hemorrhages No results found.  MUSCULOSKELETAL: Gait, strength, tone, movements noted in Neurologic exam below  NEUROLOGIC: MENTAL STATUS:      No data to display         awake, alert, oriented to person Moderate expressive aphasia   Mild comprehension impairment.  Difficulty with naming and repetition.  CRANIAL NERVE:  2nd - no papilledema on fundoscopic exam 2nd, 3rd, 4th, 6th - pupils equal and reactive to light, left gaze preference but able to look all the way to the right extraocular muscles intact, no nystagmus dense right homonymous hemianopsia 5th - facial sensation symmetric 7th - facial strength --> DENSE RIGHT LOWER FACIAL WEAKNESS 8th - hearing intact 9th - palate elevates symmetrically, uvula midline 11th - shoulder shrug --> ABSENT ON RIGHT 12th - tongue protrusion --> DEVIATES TO RIGHT  MOTOR:  SPASTIC, DENSE RIGHT HEMIPLEGIA (0/5) right foot drop normal bulk and tone strength in the LUE, LLE  SENSORY:  DECR IN RIGHT ARM AND RIGHT LEG  COORDINATION:  finger-nose-finger, fine finger movements NORMAL ON LEFT  REFLEXES:  deep tendon reflexes --> SLIGHTLY BRISK IN RUE AND RLE  GAIT/STATION:  IN WHEEL CHAIR   NIH stroke scale 14 Modified Rankin score 4.  DIAGNOSTIC DATA (LABS, IMAGING, TESTING) - I reviewed patient records, labs, notes, testing and imaging myself where available.  Lab Results  Component Value Date   WBC 7.6 02/05/2024   HGB 10.7 (L) 02/05/2024   HCT 32.5 (L) 02/05/2024   MCV 89.8 02/05/2024   PLT 228 02/05/2024  Component Value Date/Time   NA 140  02/05/2024 0410   NA 144 11/21/2023 1113   K 4.2 02/05/2024 0410   CL 109 02/05/2024 0410   CO2 24 02/05/2024 0410   GLUCOSE 101 (H) 02/05/2024 0410   BUN 37 (H) 02/05/2024 0410   BUN 26 (H) 11/21/2023 1113   CREATININE 2.05 (H) 02/05/2024 0410   CREATININE 1.15 10/31/2019 0954   CALCIUM  8.9 02/05/2024 0410   PROT 7.4 02/05/2024 0410   PROT 7.5 11/21/2023 1113   ALBUMIN 3.6 02/05/2024 0410   ALBUMIN 4.0 (L) 11/21/2023 1113   AST 40 02/05/2024 0410   ALT 74 (H) 02/05/2024 0410   ALKPHOS 99 02/05/2024 0410   BILITOT 0.3 02/05/2024 0410   BILITOT 0.3 11/21/2023 1113   GFRNONAA 41 (L) 02/05/2024 0410   GFRNONAA 81 10/31/2019 0954   GFRAA 94 10/31/2019 0954   Lab Results  Component Value Date   CHOL 156 04/22/2024   HDL 47 04/22/2024   LDLCALC 96 04/22/2024   TRIG 65 04/22/2024   CHOLHDL 3.3 04/22/2024   Lab Results  Component Value Date   HGBA1C 6.2 (H) 04/22/2024   No results found for: VITAMINB12 Lab Results  Component Value Date   TSH 1.720 11/21/2023      ASSESSMENT AND PLAN  44 y.o. 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  Continue aspirin  81 mg daily but stop Plavix  now for secondary stroke prevention and maintain strict control of hypertension with blood pressure goal below 130/90, diabetes with hemoglobin A1c goal below 6.5% and lipids with LDL cholesterol goal below 70 mg/dL. I also advised the patient to eat a healthy diet with plenty of whole grains, cereals, fruits and vegetables, exercise regularly and maintain ideal body weight.  Check screening lipid profile, hemoglobin A1c, carotid ultrasound and transcranial Doppler studies.  Followup in the future with: nurse practitioner in 6 months or call earlier if necessary. Greater than 50% time during this 40-minute visit was spent in counseling and coordination of care about his new onset seizures as well as remote stroke and spastic hemiplegia and discussion with patient and sister and answering questions.   Eather Popp, , MD 01/30/2025, 1:39 PM Certified in Neurology, Neurophysiology and Neuroimaging  [facility] [address], [address] [place], [place] [address] [phone]  "

## 2025-01-31 ENCOUNTER — Ambulatory Visit: Admitting: Neurology

## 2025-03-14 ENCOUNTER — Ambulatory Visit: Admitting: Family Medicine

## 2025-03-24 ENCOUNTER — Ambulatory Visit: Admitting: Podiatry

## 2025-09-02 ENCOUNTER — Ambulatory Visit: Admitting: Neurology
